# Patient Record
Sex: Female | Born: 1965 | Race: White | Hispanic: No | Smoking: Former smoker
Health system: Southern US, Community
[De-identification: ages and names within clinical notes are randomized; demographics above are authoritative.]

## PROBLEM LIST (undated history)

## (undated) DIAGNOSIS — N920 Excessive and frequent menstruation with regular cycle: Secondary | ICD-10-CM

## (undated) DIAGNOSIS — I480 Paroxysmal atrial fibrillation: Secondary | ICD-10-CM

## (undated) DIAGNOSIS — F419 Anxiety disorder, unspecified: Secondary | ICD-10-CM

## (undated) DIAGNOSIS — R0683 Snoring: Secondary | ICD-10-CM

## (undated) DIAGNOSIS — F329 Major depressive disorder, single episode, unspecified: Secondary | ICD-10-CM

## (undated) DIAGNOSIS — D509 Iron deficiency anemia, unspecified: Secondary | ICD-10-CM

## (undated) DIAGNOSIS — B019 Varicella without complication: Secondary | ICD-10-CM

## (undated) DIAGNOSIS — K219 Gastro-esophageal reflux disease without esophagitis: Secondary | ICD-10-CM

## (undated) DIAGNOSIS — Z8719 Personal history of other diseases of the digestive system: Secondary | ICD-10-CM

## (undated) DIAGNOSIS — M1990 Unspecified osteoarthritis, unspecified site: Secondary | ICD-10-CM

## (undated) DIAGNOSIS — G43909 Migraine, unspecified, not intractable, without status migrainosus: Secondary | ICD-10-CM

## (undated) DIAGNOSIS — I4891 Unspecified atrial fibrillation: Principal | ICD-10-CM

## (undated) DIAGNOSIS — D219 Benign neoplasm of connective and other soft tissue, unspecified: Secondary | ICD-10-CM

## (undated) DIAGNOSIS — D649 Anemia, unspecified: Secondary | ICD-10-CM

## (undated) DIAGNOSIS — F32A Depression, unspecified: Secondary | ICD-10-CM

## (undated) DIAGNOSIS — E538 Deficiency of other specified B group vitamins: Secondary | ICD-10-CM

## (undated) DIAGNOSIS — I10 Essential (primary) hypertension: Secondary | ICD-10-CM

## (undated) HISTORY — DX: Paroxysmal atrial fibrillation: I48.0

## (undated) HISTORY — DX: Snoring: R06.83

## (undated) HISTORY — DX: Unspecified osteoarthritis, unspecified site: M19.90

## (undated) HISTORY — DX: Excessive and frequent menstruation with regular cycle: N92.0

## (undated) HISTORY — DX: Migraine, unspecified, not intractable, without status migrainosus: G43.909

## (undated) HISTORY — PX: TUBAL LIGATION: SHX77

## (undated) HISTORY — DX: Benign neoplasm of connective and other soft tissue, unspecified: D21.9

## (undated) HISTORY — DX: Unspecified atrial fibrillation: I48.91

## (undated) HISTORY — DX: Anxiety disorder, unspecified: F41.9

## (undated) HISTORY — PX: OTHER SURGICAL HISTORY: SHX169

## (undated) HISTORY — DX: Varicella without complication: B01.9

## (undated) HISTORY — PX: ABDOMINAL HYSTERECTOMY: SHX81

## (undated) HISTORY — DX: Iron deficiency anemia, unspecified: D50.9

## (undated) HISTORY — DX: Depression, unspecified: F32.A

## (undated) HISTORY — DX: Deficiency of other specified B group vitamins: E53.8

## (undated) HISTORY — DX: Major depressive disorder, single episode, unspecified: F32.9

## (undated) HISTORY — DX: Gastro-esophageal reflux disease without esophagitis: K21.9

---

## 2004-12-13 ENCOUNTER — Ambulatory Visit: Payer: Self-pay | Admitting: Gastroenterology

## 2007-03-26 ENCOUNTER — Ambulatory Visit: Payer: Self-pay | Admitting: Unknown Physician Specialty

## 2007-04-01 ENCOUNTER — Ambulatory Visit: Payer: Self-pay | Admitting: Unknown Physician Specialty

## 2007-12-12 ENCOUNTER — Emergency Department: Payer: Self-pay | Admitting: Emergency Medicine

## 2008-01-21 ENCOUNTER — Inpatient Hospital Stay: Payer: Self-pay

## 2008-03-21 HISTORY — PX: TONSILLECTOMY: SUR1361

## 2008-10-06 ENCOUNTER — Encounter: Payer: Self-pay | Admitting: Orthopedic Surgery

## 2008-10-19 ENCOUNTER — Encounter: Payer: Self-pay | Admitting: Orthopedic Surgery

## 2008-12-10 ENCOUNTER — Encounter: Admission: RE | Admit: 2008-12-10 | Discharge: 2008-12-10 | Payer: Self-pay | Admitting: Sports Medicine

## 2009-09-12 ENCOUNTER — Emergency Department: Payer: Self-pay | Admitting: Internal Medicine

## 2009-12-19 HISTORY — PX: APPENDECTOMY: SHX54

## 2010-01-10 ENCOUNTER — Observation Stay: Payer: Self-pay | Admitting: Surgery

## 2010-02-26 ENCOUNTER — Inpatient Hospital Stay: Payer: Self-pay | Admitting: Psychiatry

## 2010-09-22 ENCOUNTER — Encounter: Payer: Self-pay | Admitting: Maternal & Fetal Medicine

## 2011-02-16 ENCOUNTER — Observation Stay: Payer: Self-pay | Admitting: Obstetrics and Gynecology

## 2011-02-27 ENCOUNTER — Observation Stay: Payer: Self-pay

## 2011-03-30 ENCOUNTER — Observation Stay: Payer: Self-pay

## 2011-04-21 ENCOUNTER — Ambulatory Visit: Payer: Self-pay

## 2011-04-24 ENCOUNTER — Inpatient Hospital Stay: Payer: Self-pay | Admitting: Obstetrics and Gynecology

## 2011-04-26 LAB — PATHOLOGY REPORT

## 2011-11-22 ENCOUNTER — Ambulatory Visit: Payer: Self-pay | Admitting: Family Medicine

## 2011-11-27 ENCOUNTER — Emergency Department: Payer: Self-pay | Admitting: Emergency Medicine

## 2011-11-27 LAB — BASIC METABOLIC PANEL
BUN: 12 mg/dL (ref 7–18)
Chloride: 105 mmol/L (ref 98–107)
Co2: 25 mmol/L (ref 21–32)
Creatinine: 0.77 mg/dL (ref 0.60–1.30)
EGFR (Non-African Amer.): >60
Glucose: 96 mg/dL (ref 65–99)
Osmolality: 277 (ref 275–301)
Sodium: 139 mmol/L (ref 136–145)

## 2011-11-27 LAB — CBC
HCT: 39.5 % (ref 35.0–47.0)
MCH: 27.1 pg (ref 26.0–34.0)
MCHC: 32.2 g/dL (ref 32.0–36.0)
Platelet: 376 10*3/uL (ref 150–440)
RBC: 4.7 10*6/uL (ref 3.80–5.20)
RDW: 17.3 % — ABNORMAL HIGH (ref 11.5–14.5)

## 2011-11-27 LAB — URINALYSIS, COMPLETE
Bilirubin,UR: NEGATIVE
Nitrite: NEGATIVE
Protein: NEGATIVE
Specific Gravity: 1.028 (ref 1.003–1.030)

## 2011-11-28 LAB — URINE CULTURE

## 2012-03-21 ENCOUNTER — Other Ambulatory Visit: Payer: Self-pay | Admitting: Gastroenterology

## 2012-03-29 ENCOUNTER — Ambulatory Visit: Payer: Self-pay | Admitting: Gastroenterology

## 2013-01-29 ENCOUNTER — Ambulatory Visit: Payer: Self-pay | Admitting: General Surgery

## 2013-02-03 ENCOUNTER — Ambulatory Visit: Payer: Self-pay

## 2013-02-10 ENCOUNTER — Encounter: Payer: Self-pay | Admitting: General Surgery

## 2013-02-10 ENCOUNTER — Ambulatory Visit (INDEPENDENT_AMBULATORY_CARE_PROVIDER_SITE_OTHER): Payer: BC Managed Care – PPO | Admitting: General Surgery

## 2013-02-10 VITALS — BP 138/80 | HR 76 | Resp 14 | Ht 65.0 in | Wt 193.0 lb

## 2013-02-10 DIAGNOSIS — L723 Sebaceous cyst: Secondary | ICD-10-CM

## 2013-02-10 DIAGNOSIS — IMO0002 Reserved for concepts with insufficient information to code with codable children: Secondary | ICD-10-CM | POA: Insufficient documentation

## 2013-02-10 NOTE — Progress Notes (Signed)
[name], female   DOB: September 27, 1965, 47 y.o.   MRN: [id number]  Chief Complaint  Patient presents with  . Other    cyst    HPI [name] [name] is a 47 y.o. female here today for an recurrenting   right groin cyst. Patient states this is the 4th time she has had one since her daughter was born 66 months ago. She is using warm compresses and applying topical antibiotics to this area. She states it is painful at here underwear line.Patient has had it lanced twice before.  HPI  Past Medical History  Diagnosis Date  . GERD (gastroesophageal reflux disease)     Past Surgical History  Procedure Laterality Date  . Appendectomy  5/11  . Cesarean section  10/98,9/12  . Tonsillectomy  03/2008    History reviewed. No pertinent family history.  Social History History  Substance Use Topics  . Smoking status: Former Smoker -- 1.00 packs/day for 10 years  . Smokeless tobacco: Never Used  . Alcohol Use: Yes    Allergies  Allergen Reactions  . Augmentin (Amoxicillin-Pot Clavulanate) Nausea Only  . Codeine Nausea Only  . Latex Rash  . Percocet (Oxycodone-Acetaminophen) Rash    Current Outpatient Prescriptions  Medication Sig Dispense Refill  . clonazePAM (KLONOPIN) 1 MG tablet Take 1 mg by mouth 2 (two) times daily as needed for anxiety.      Marland Kitchen omeprazole (PRILOSEC) 20 MG capsule Take 20 mg by mouth daily.      . sertraline (ZOLOFT) 100 MG tablet Take 100 mg by mouth daily.      . traMADol (ULTRAM) 50 MG tablet Take 50 mg by mouth every 6 (six) hours as needed for pain.       No current facility-administered medications for this visit.    Review of Systems Review of Systems  Constitutional: Negative.   Respiratory: Negative.   Cardiovascular: Negative.     Blood pressure 138/80, pulse 76, resp. rate 14, height 5\' 5"  (1.651 m), weight 193 lb (87.544 kg), last menstrual period 01/27/2013.  Physical Exam Physical Exam  Abdominal: Soft. There is no hepatomegaly.  There is no tenderness. No hernia.    Lymphadenopathy:       Right: No inguinal adenopathy present.       Left: No inguinal adenopathy present.    Data Reviewed Office notes Dr. 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     Plan    Patient to have an excision right inguinal skin cyst          SANKAR,SEEPLAPUTHUR G 02/10/2013, 1:04 PM

## 2013-02-10 NOTE — Patient Instructions (Addendum)
Patient to return for an excision

## 2013-02-20 ENCOUNTER — Ambulatory Visit (INDEPENDENT_AMBULATORY_CARE_PROVIDER_SITE_OTHER): Payer: BC Managed Care – PPO | Admitting: General Surgery

## 2013-02-20 ENCOUNTER — Encounter: Payer: Self-pay | Admitting: General Surgery

## 2013-02-20 VITALS — BP 110/86 | HR 70 | Resp 12 | Ht 65.0 in | Wt 190.0 lb

## 2013-02-20 DIAGNOSIS — IMO0002 Reserved for concepts with insufficient information to code with codable children: Secondary | ICD-10-CM

## 2013-02-20 DIAGNOSIS — L723 Sebaceous cyst: Secondary | ICD-10-CM

## 2013-02-20 NOTE — Patient Instructions (Addendum)
The patient is aware to call back for any questions or concerns. Keep dressing and area dry for a day.  Monitor for skin irriation from dressing.

## 2013-02-20 NOTE — Progress Notes (Signed)
[name], female   DOB: [birth year], 47 y.o.   MRN: [id number]  Chief Complaint  Patient presents with  . Procedure    excision skin cyst right groin    HPI [name] [name] is a 47 y.o. female here today for a right groin excision skin cyst. HPI  Past Medical History  Diagnosis Date  . GERD (gastroesophageal reflux disease)     Past Surgical History  Procedure Laterality Date  . Appendectomy  5/11  . Cesarean section  10/98,9/12  . Tonsillectomy  03/2008    History reviewed. No pertinent family history.  Social History History  Substance Use Topics  . Smoking status: Former Smoker -- 1.00 packs/day for 10 years  . Smokeless tobacco: Never Used  . Alcohol Use: Yes    Allergies  Allergen Reactions  . Augmentin (Amoxicillin-Pot Clavulanate) Nausea Only  . Codeine Nausea Only  . Latex Rash  . Percocet (Oxycodone-Acetaminophen) Rash    Current Outpatient Prescriptions  Medication Sig Dispense Refill  . clonazePAM (KLONOPIN) 1 MG tablet Take 1 mg by mouth 2 (two) times daily as needed for anxiety.      Marland Kitchen omeprazole (PRILOSEC) 20 MG capsule Take 20 mg by mouth daily.      . sertraline (ZOLOFT) 100 MG tablet Take 100 mg by mouth daily.      . traMADol (ULTRAM) 50 MG tablet Take 50 mg by mouth every 6 (six) hours as needed for pain.       No current facility-administered medications for this visit.    Review of Systems Review of Systems  Constitutional: Negative.   Respiratory: Negative.   Cardiovascular: Negative.     Blood pressure 110/86, pulse 70, resp. rate 12, height 5\' 5"  (1.651 m), weight 190 lb (86.183 kg), last menstrual period 01/27/2013.  Physical Exam Physical Exam  Data Reviewed none  Assessment    Snkin cyst right groin     Plan    Excision completed today.      Procedure: excision skin cyst right groin. Prep : betadine Anesthetic: 1% xylocaine mixed with 0.5% marcaine, 5 ml. Elliptical incision made around the cyst and  excised from subcutaneous tissue. Disposable cautery used. Skin closed with subcuticular 4-0 vicryl.  Dressed with steristrips, gauze and tegaderm. No immediate problems from procedure.  SANKAR,SEEPLAPUTHUR G 02/21/2013, 7:41 AM

## 2013-02-21 ENCOUNTER — Encounter: Payer: Self-pay | Admitting: General Surgery

## 2013-02-26 ENCOUNTER — Telehealth: Payer: Self-pay | Admitting: *Deleted

## 2013-02-26 LAB — PATHOLOGY

## 2013-02-26 NOTE — Telephone Encounter (Signed)
Message copied by Currie Paris on Wed Feb 26, 2013  8:29 AM ------      Message from: Kieth Brightly      Created: Tue Feb 25, 2013  5:11 PM       Please let pt pt know the pathology was normal. ------

## 2013-02-26 NOTE — Telephone Encounter (Signed)
Notified patient as instructed, patient pleased. Discussed follow-up appointments on an as needed basis, patient agrees.  States area is healing well, just a little sore.

## 2013-03-26 ENCOUNTER — Observation Stay: Payer: Self-pay | Admitting: Internal Medicine

## 2013-03-26 LAB — CBC WITH DIFFERENTIAL/PLATELET
Basophil #: 0.1 10*3/uL (ref 0.0–0.1)
Eosinophil %: 1.9 %
HGB: 12.7 g/dL (ref 12.0–16.0)
Lymphocyte #: 2.6 10*3/uL (ref 1.0–3.6)
MCH: 26.4 pg (ref 26.0–34.0)
Monocyte #: 0.7 x10 3/mm (ref 0.2–0.9)
Neutrophil %: 61.3 %
Platelet: 349 10*3/uL (ref 150–440)
WBC: 9.1 10*3/uL (ref 3.6–11.0)

## 2013-03-26 LAB — CK TOTAL AND CKMB (NOT AT ARMC)
CK, Total: 74 U/L (ref 21–215)
CK-MB: <0.5 ng/mL — ABNORMAL LOW (ref 0.5–3.6)

## 2013-03-26 LAB — BASIC METABOLIC PANEL
Calcium, Total: 9 mg/dL (ref 8.5–10.1)
Creatinine: 0.89 mg/dL (ref 0.60–1.30)
Sodium: 139 mmol/L (ref 136–145)

## 2013-03-27 DIAGNOSIS — I059 Rheumatic mitral valve disease, unspecified: Secondary | ICD-10-CM

## 2013-03-27 DIAGNOSIS — I4891 Unspecified atrial fibrillation: Secondary | ICD-10-CM

## 2013-03-27 LAB — CBC WITH DIFFERENTIAL/PLATELET
Basophil #: 0 10*3/uL (ref 0.0–0.1)
Basophil %: 0.5 %
Eosinophil %: 3.6 %
Lymphocyte #: 2.4 10*3/uL (ref 1.0–3.6)
MCH: 26.3 pg (ref 26.0–34.0)
Neutrophil #: 3 10*3/uL (ref 1.4–6.5)
Platelet: 273 10*3/uL (ref 150–440)
WBC: 6.2 10*3/uL (ref 3.6–11.0)

## 2013-03-27 LAB — LIPID PANEL
HDL Cholesterol: 45 mg/dL (ref 40–60)
Ldl Cholesterol, Calc: 111 mg/dL — ABNORMAL HIGH (ref 0–100)
Triglycerides: 84 mg/dL (ref 0–200)

## 2013-03-27 LAB — BASIC METABOLIC PANEL
BUN: 8 mg/dL (ref 7–18)
Calcium, Total: 8.5 mg/dL (ref 8.5–10.1)
Creatinine: 0.76 mg/dL (ref 0.60–1.30)
EGFR (Non-African Amer.): >60
Glucose: 98 mg/dL (ref 65–99)
Sodium: 140 mmol/L (ref 136–145)

## 2013-03-27 LAB — CK TOTAL AND CKMB (NOT AT ARMC)
CK, Total: 56 U/L (ref 21–215)
CK-MB: <0.5 ng/mL — ABNORMAL LOW (ref 0.5–3.6)

## 2013-03-31 ENCOUNTER — Telehealth: Payer: Self-pay

## 2013-03-31 NOTE — Telephone Encounter (Signed)
Called spoke with pt, pt reports HR in 40's yesterday 03/30/13.  Pt called spoke with Dr Ladona Ridgel on call he advised pt to hold Cardizem.  Pt was in [facility] with rapid afib, discharged on Cardizem 30mg  QID and Metoprolol 25mg  BID and prn.  Pt is still taking Metoprolol BID and HR seems to have returned to normal rate 60's and pt reports feeling better.  Advised pt to continue on Metoprolol 25mg  BID and continue to monitor HR.  Call back with problems.  Will make Dr Mariah Milling aware of medication changes and call pt back if Dr Ethelene Hal recommends any additional changes be made.  Pt verbalizes understanding.

## 2013-03-31 NOTE — Telephone Encounter (Signed)
Pt states she talked with Dr. Ladona Ridgel yesterday, and he told her to stop the medication the hospital gave her for irregular HR, she is not sure of the name. States Dr. Mariah Milling prescribed her Metoprolol, which she is still taking. Wants to make sure it is ok with dr Mariah Milling for her to stop this medication. States she does feel better since she stopped taking this Pt is at [employer],  states when we return her call ,press 0 and have her paged.

## 2013-04-10 ENCOUNTER — Encounter: Payer: Self-pay | Admitting: Cardiovascular Disease

## 2013-04-10 ENCOUNTER — Ambulatory Visit (INDEPENDENT_AMBULATORY_CARE_PROVIDER_SITE_OTHER): Payer: BC Managed Care – PPO | Admitting: Cardiovascular Disease

## 2013-04-10 ENCOUNTER — Encounter: Payer: Self-pay | Admitting: Physician Assistant

## 2013-04-10 VITALS — BP 110/70 | HR 109 | Ht 65.0 in | Wt 188.2 lb

## 2013-04-10 DIAGNOSIS — I498 Other specified cardiac arrhythmias: Secondary | ICD-10-CM

## 2013-04-10 DIAGNOSIS — I4902 Ventricular flutter: Secondary | ICD-10-CM

## 2013-04-10 DIAGNOSIS — I480 Paroxysmal atrial fibrillation: Secondary | ICD-10-CM | POA: Insufficient documentation

## 2013-04-10 DIAGNOSIS — I4891 Unspecified atrial fibrillation: Secondary | ICD-10-CM

## 2013-04-10 MED ORDER — FLECAINIDE ACETATE 50 MG PO TABS: 50.0000 mg | ORAL_TABLET | Freq: Two times a day (BID) | ORAL | Status: DC

## 2013-04-10 MED ORDER — METOPROLOL TARTRATE 25 MG PO TABS: 25.0000 mg | ORAL_TABLET | Freq: Two times a day (BID) | ORAL | Status: DC

## 2013-04-10 NOTE — Assessment & Plan Note (Signed)
Continued paroxysmal atrial fibrillation. She is symptomatic. For now we will start her on gentle anticoagulation after a long discussion. She will start xarelto 15 mg daily on the hope of stopping this in several weeks' time. We have discussed this with her that it is the low-dose only.  In an effort to control her rhythm, we'll start flecainide 50 mg twice a day with metoprolol 25 mg twice a day. If she continues to have palpitations, EKG showing atrial fibrillation, we would increase the flecainide to 100 mg twice a day. She was unable to tolerate calcium channel blockers secondary to hypotension.

## 2013-04-10 NOTE — Patient Instructions (Addendum)
You are still having atrial fibrillation We will start flecainide 50 mg twice a day Continue on metoprolol 25 mg twice a day  Start xarelto 15 mg once a day (blood thinner) until rhythm is regulated  Please call us if you have new issues that need to be addressed before your next appt.  Your physician wants you to follow-up in: 1 month.

## 2013-04-10 NOTE — Progress Notes (Signed)
[name], female    DOB: June 05, 1966, 47 y.o.   MRN: [id number]  HPI Comments: [name] [name] is a 47 year old woman with history of GERD, presenting to the hospital 03/27/2013 with tachycardia, shortness of breath. Diagnosed with atrial fibrillation with RVR, heart rate 140s. She was started on Cardizem, beta blocker with improved heart rate control, converting to normal sinus rhythm with short runs of tachycardia. She was discharged on Cardizem 4 times a day, metoprolol 25 mg twice a day.  Outpatient, she reports heart rates in the 40s. She call the office and was told to hold the Cardizem. Now she has tachycardia, EKG showing heart rates greater than 100. Otherwise she feels well with no complaints. She does not want anticoagulation if she can help it. She reports having significant stressors, otherwise no other active medical issues.  Echocardiogram August 2014 showing normal LV systolic function, LV diastolic relaxation abnormality, normal right ventricular systolic pressure  EKG shows normal sinus rhythm with frequent short runs of atrial fibrillation/   Outpatient Encounter Prescriptions as of 04/10/2013  Medication Sig Dispense Refill  . clonazePAM (KLONOPIN) 1 MG tablet Take 1 mg by mouth 2 (two) times daily as needed for anxiety.      . metoprolol tartrate (LOPRESSOR) 25 MG tablet Take 1 tablet (25 mg total) by mouth 2 (two) times daily. May take an additional tablet for palpitations or tachycardia  180 tablet  3  . omeprazole (PRILOSEC) 20 MG capsule Take 20 mg by mouth daily.      . sertraline (ZOLOFT) 100 MG tablet Take 100 mg by mouth daily.      . traMADol (ULTRAM) 50 MG tablet Take 50 mg by mouth every 6 (six) hours as needed for pain.       Review of Systems  Constitutional: Negative.   HENT: Negative.   Eyes: Negative.   Respiratory: Positive for shortness of breath.   Cardiovascular: Positive for palpitations.  Gastrointestinal: Negative.   Musculoskeletal:  Negative.   Skin: Negative.   Neurological: Negative.   Psychiatric/Behavioral: Negative.   All other systems reviewed and are negative.    BP 110/70  Pulse 109  Ht 5\' 5"  (1.651 m)  Wt 188 lb 4 oz (85.39 kg)  BMI 31.33 kg/m2  Physical Exam  Nursing note and vitals reviewed. Constitutional: She is oriented to person, place, and time. She appears well-developed and well-nourished.  HENT:  Head: Normocephalic.  Nose: Nose normal.  Mouth/Throat: Oropharynx is clear and moist.  Eyes: Conjunctivae are normal. Pupils are equal, round, and reactive to light.  Neck: Normal range of motion. Neck supple. No JVD present.  Cardiovascular: Normal rate, regular rhythm, S1 normal, S2 normal, normal heart sounds and intact distal pulses.  Exam reveals no gallop and no friction rub.   No murmur heard. Pulmonary/Chest: Effort normal and breath sounds normal. No respiratory distress. She has no wheezes. She has no rales. She exhibits no tenderness.  Abdominal: Soft. Bowel sounds are normal. She exhibits no distension. There is no tenderness.  Musculoskeletal: Normal range of motion. She exhibits no edema and no tenderness.  Lymphadenopathy:    She has no cervical adenopathy.  Neurological: She is alert and oriented to person, place, and time. Coordination normal.  Skin: Skin is warm and dry. No rash noted. No erythema.  Psychiatric: She has a normal mood and affect. Her behavior is normal. Judgment and thought content normal.    Assessment and Plan

## 2013-04-14 ENCOUNTER — Telehealth: Payer: Self-pay

## 2013-04-14 ENCOUNTER — Ambulatory Visit (INDEPENDENT_AMBULATORY_CARE_PROVIDER_SITE_OTHER): Payer: BC Managed Care – PPO

## 2013-04-14 VITALS — BP 114/79 | HR 108 | Ht 65.0 in | Wt 188.4 lb

## 2013-04-14 DIAGNOSIS — R079 Chest pain, unspecified: Secondary | ICD-10-CM

## 2013-04-14 NOTE — Telephone Encounter (Signed)
pt called in today wanting to s/w a nurse states she is not feeling since Dr. Mariah Milling started her on flecaininde 50 bid, metoprolol 25 bid. states a little lightheaded, chest pressure but felt better once she sat down, also night sweats since starting med. Advised I will d/w Dr. Mariah Milling about her sxms and cb at [phone] her work number per pt request. Pt verbalized understanding to Plan of Care.

## 2013-04-14 NOTE — Patient Instructions (Addendum)
STOP FLECAINIDE PER DR. Mariah Milling AS OF TODAY;  OK TO TAKE EXTRA METOPROLOL FOR BREAK THROUGH PALPITATIONS ;  TAKE 50 MG OF METOPROLOL TONIGHT 04/14/13  YOU HAVE A FOLLOW UP WITH DR. Mariah Milling 04/28/13 @ 8 AM

## 2013-04-14 NOTE — Progress Notes (Signed)
[name], female   DOB: March 11, 1966, 47 y.o.   MRN: [id number] Pt called earlier today stating chest pressure, lightheaded feeling and night sweats and just not feeling well today. Pt was started on flecainide 50 mg bid last week when she saw Dr. Mariah Milling. I d/w Dr. Mariah Milling about pt's sxms and he states lets have pt try decreasing flecainide in 1/2 to 25 mg bid. Pt then states to me she was still having the chest pressure with exertion. I sated she may need to go to the ED but let me d/w Dr. Mariah Milling.   Pt said she may not go to the ED but will wait for my cb. I d/w Dr. Mariah Milling in further detail about the chest pressure with exertion and he said pt probably does not need to go to the ED but let have her come in for an EKG and BP check. When I was trying tcb pt one of the schedulers came to me and said that pt had cb and said she wanted to come in for BP check. I said that was fine. I then cb pt on her cell and explained great that she was coming in so we can do an EKG and check her BP and see what Dr. Mariah Milling says..  Vitals today BP: 114/79 HR: 108 WT: 188.4 lb HT: 5\' 5" 

## 2013-04-14 NOTE — Telephone Encounter (Signed)
ekg in the office appears to be NSR with short runs of atrial tachycardia, rate was elevated Could try metoprolol 1 1/2 tabs BID if blood pressure tolerates. Flecainide was for atrial fib Suspect she may be having two types of rhythm, atrial fib and atrial tach. Meds should work for both if tolerated

## 2013-04-14 NOTE — Telephone Encounter (Signed)
s/w Dr. Mariah Milling he recommends to decrease fliecainide to 25 mg bid , pt aware. Pt states still having chest pressure and is with exertion. I advised that maybe should she should go to ED to be evaluated. Pt states she may not go. I said let me d/w Dr. Mariah Milling and cb. Dr. Mariah Milling states just d/c flecainide and when she feels better to go back on flecainide 25 mg bid and to keep a record of her BP and HR while off the flecainide.

## 2013-04-16 NOTE — Telephone Encounter (Signed)
Spoke w/ pt.  States that her HR has been up to 117 and down to 49 within a matter of hours.  She c/o fatigue on exertion.   BP has been in normal range, but pt feels exhausted.  Instructed her to try metoprolol 1 1/2 tabs BID and keep an eye on her BP.  Educated pt on normal values for BP & HR and when to seek medical attention.  Pt verbalizes understanding & states she will go to ED for evaluation if needed.

## 2013-04-17 ENCOUNTER — Telehealth: Payer: Self-pay

## 2013-04-17 NOTE — Telephone Encounter (Signed)
Pt would like a call regarding meds. BP is 102/68. HR is 53. Please call

## 2013-04-17 NOTE — Telephone Encounter (Signed)
Spoke w/ pt.  She states that her vitals this afternoon were BP 102/68, P 53, after a nap,105/72, 105 and 3 minutes later 92/62, 85.  Consulted with Alinda Money, [place] who suggested increasing pt's flecainide, but according to Dr. Windell Hummingbird note on 8/25, pt was to stop flecainide and resume 25 mg bid when she felt better.  Instructed pt to resume flecainide and not to take metoprolol 1 1/2 tabs if HR is in the 50 range.  Pt verbalizes understanding and will keep her appt with Dr. Mariah Milling on 9/8.  She would appreciate it if Dr. Mariah Milling would review her "situation" and make any suggestions when he gets back in the office.

## 2013-04-18 NOTE — Telephone Encounter (Signed)
If BP drops on metoprolol 37.5 mg BID, Would go back to 25 BID  Flecainide was started because of low BPs And need for rhythm control Would restart flecainide 25 bid if chest pain resolved, Only titrate upwards for heart rate >100

## 2013-04-18 NOTE — Telephone Encounter (Signed)
Spoke w/ pt.  She states that she felt much better today and verbalizes understanding.

## 2013-04-24 ENCOUNTER — Encounter: Payer: Self-pay | Admitting: Cardiovascular Disease

## 2013-04-28 ENCOUNTER — Ambulatory Visit: Payer: BC Managed Care – PPO | Admitting: Cardiovascular Disease

## 2013-05-07 ENCOUNTER — Ambulatory Visit: Payer: BC Managed Care – PPO | Admitting: Cardiovascular Disease

## 2013-05-07 ENCOUNTER — Encounter: Payer: Self-pay | Admitting: Cardiovascular Disease

## 2013-05-07 ENCOUNTER — Ambulatory Visit (INDEPENDENT_AMBULATORY_CARE_PROVIDER_SITE_OTHER): Payer: BC Managed Care – PPO | Admitting: Cardiovascular Disease

## 2013-05-07 VITALS — BP 100/64 | HR 70 | Ht 65.0 in | Wt 187.0 lb

## 2013-05-07 DIAGNOSIS — I4891 Unspecified atrial fibrillation: Secondary | ICD-10-CM

## 2013-05-07 NOTE — Progress Notes (Signed)
[name], female    DOB: 1965/12/10, 47 y.o.   MRN: [id number]  HPI Comments: [name] [name] is a 47 year old woman with history of GERD, presenting to the hospital 03/27/2013 with tachycardia, shortness of breath. Diagnosed with atrial fibrillation with RVR, heart rate 140s. She was started on Cardizem, beta blocker with improved heart rate control, converting to normal sinus rhythm with short runs of tachycardia. She was discharged on Cardizem 4 times a day, metoprolol 25 mg twice a day.  On her last clinic visit, she was started on flecainide 50 mg twice a day with low-dose metoprolol 25 mg twice a day. On this regimen, she reports that she has been doing well. She has rare episodes of palpitations 2 or 3 times per week lasting for several minutes at a time. Otherwise she feels well. She's interested in stopping the anticoagulation  Echocardiogram August 2014 showing normal LV systolic function, LV diastolic relaxation abnormality, normal right ventricular systolic pressure TSH in the hospital was normal, 1.9  EKG shows normal sinus rhythm with  APCs   Outpatient Encounter Prescriptions as of 05/07/2013  Medication Sig Dispense Refill  . clonazePAM (KLONOPIN) 1 MG tablet Take 1 mg by mouth 2 (two) times daily as needed for anxiety.      . flecainide (TAMBOCOR) 50 MG tablet Take 50 mg by mouth 2 (two) times daily.      . metoprolol tartrate (LOPRESSOR) 25 MG tablet Take 1 tablet (25 mg total) by mouth 2 (two) times daily. May take an additional tablet for palpitations or tachycardia  180 tablet  3  . omeprazole (PRILOSEC) 20 MG capsule Take 20 mg by mouth daily.      . Rivaroxaban (XARELTO) 20 MG TABS tablet Take 20 mg by mouth daily.      . sertraline (ZOLOFT) 100 MG tablet Take 100 mg by mouth daily.      . traMADol (ULTRAM) 50 MG tablet Take 50 mg by mouth every 6 (six) hours as needed for pain.        Review of Systems  Constitutional: Negative.   HENT: Negative.   Eyes:  Negative.   Respiratory: Negative.   Cardiovascular: Negative.   Gastrointestinal: Negative.   Endocrine: Negative.   Musculoskeletal: Negative.   Skin: Negative.   Allergic/Immunologic: Negative.   Neurological: Negative.   Hematological: Negative.   Psychiatric/Behavioral: Negative.   All other systems reviewed and are negative.    BP 100/64  Pulse 70  Ht 5\' 5"  (1.651 m)  Wt 187 lb (84.823 kg)  BMI 31.12 kg/m2  Physical Exam  Nursing note and vitals reviewed. Constitutional: She is oriented to person, place, and time. She appears well-developed and well-nourished.  HENT:  Head: Normocephalic.  Nose: Nose normal.  Mouth/Throat: Oropharynx is clear and moist.  Eyes: Conjunctivae are normal. Pupils are equal, round, and reactive to light.  Neck: Normal range of motion. Neck supple. No JVD present.  Cardiovascular: Normal rate, regular rhythm, S1 normal, S2 normal, normal heart sounds and intact distal pulses.  Exam reveals no gallop and no friction rub.   No murmur heard. Pulmonary/Chest: Effort normal and breath sounds normal. No respiratory distress. She has no wheezes. She has no rales. She exhibits no tenderness.  Abdominal: Soft. Bowel sounds are normal. She exhibits no distension. There is no tenderness.  Musculoskeletal: Normal range of motion. She exhibits no edema and no tenderness.  Lymphadenopathy:    She has no cervical adenopathy.  Neurological: She is  alert and oriented to person, place, and time. Coordination normal.  Skin: Skin is warm and dry. No rash noted. No erythema.  Psychiatric: She has a normal mood and affect. Her behavior is normal. Judgment and thought content normal.    Assessment and Plan

## 2013-05-07 NOTE — Patient Instructions (Addendum)
You are doing well. Please hold the xarelto Start aspirin daily  Please call us if you have new issues that need to be addressed before your next appt.  Your physician wants you to follow-up in: 6 months.  You will receive a reminder letter in the mail two months in advance. If you don't receive a letter, please call our office to schedule the follow-up appointment.

## 2013-05-07 NOTE — Assessment & Plan Note (Signed)
Rare episodes of palpitations concerning for short runs of atrial fibrillation lasting for several minutes at a time, rarely during the week. We have suggested that she stay on her current medications. She's interested in holding the xarelto. We have suggested she change to aspirin but if she starts having more frequent episodes, she should take xarelto and call our office

## 2013-05-13 ENCOUNTER — Ambulatory Visit: Payer: BC Managed Care – PPO | Admitting: Cardiovascular Disease

## 2013-05-30 ENCOUNTER — Ambulatory Visit (INDEPENDENT_AMBULATORY_CARE_PROVIDER_SITE_OTHER): Payer: BC Managed Care – PPO | Admitting: Cardiovascular Disease

## 2013-05-30 ENCOUNTER — Telehealth: Payer: Self-pay

## 2013-05-30 ENCOUNTER — Encounter: Payer: Self-pay | Admitting: Cardiovascular Disease

## 2013-05-30 VITALS — BP 98/68 | HR 64 | Ht 65.0 in | Wt 187.2 lb

## 2013-05-30 DIAGNOSIS — I4891 Unspecified atrial fibrillation: Secondary | ICD-10-CM

## 2013-05-30 DIAGNOSIS — R5381 Other malaise: Secondary | ICD-10-CM

## 2013-05-30 DIAGNOSIS — R5383 Other fatigue: Secondary | ICD-10-CM

## 2013-05-30 DIAGNOSIS — R0789 Other chest pain: Secondary | ICD-10-CM

## 2013-05-30 NOTE — Telephone Encounter (Signed)
Pt states she has been having palpitation, dizzy, tired, for the past 3 days, hasnt felt good.Pt states she "just doesn't feel right". Please call.

## 2013-05-30 NOTE — Telephone Encounter (Signed)
Spoke w/ pt.  She states that she has had a lot of palpitations over the past few days.  BP 116/70, hr 101, a few mins later 53. Pain in shoulder, tired, dizzy. Pt to come in at 1:45 today to see Dr. Mariah Milling.

## 2013-05-30 NOTE — Assessment & Plan Note (Signed)
No recent episodes of atrial fibrillation. She does have fatigue and low blood pressure. Blood pressures from home sometimes in the 90s, rare 80 systolic. We have suggested she cut her metoprolol tartrate in half and take 12.5 mg twice a day and continue on flecainide 50 mg twice a day.

## 2013-05-30 NOTE — Progress Notes (Signed)
[name], female    DOB: 1966-02-29, 47 y.o.   MRN: [id number]  HPI Comments: [name] [name] is a 47 year old woman with history of GERD, presenting to the hospital 03/27/2013 with tachycardia, shortness of breath. Diagnosed with atrial fibrillation with RVR, heart rate 140s. She was started on Cardizem, beta blocker with improved heart rate control, converting to normal sinus rhythm with short runs of tachycardia. She was discharged on Cardizem 4 times a day, metoprolol 25 mg twice a day.  On a prior clinic visit she was started on flecainide 50 mg twice a day with low-dose metoprolol 25 mg twice a day. On this regimen, she reports that she has been doing well. She has rare episodes of palpitations 2 or 3 times per week. We encouraged her to stay on her anticoagulation .  In followup today, she reports that she has been tired over the past 2 or 3 days. She has some tightness in her posterior shoulder area. She has a 27-year-old that is not sleeping well at night and still taking a bottle 3 times per night . Sleep has been poor . She wonders if the medications could be contributing to her fatigue. She continues to have occasional palpitations that are short-lived. Nothing like prior atrial fibrillation episodes.  Echocardiogram August 2014 showing normal LV systolic function, LV diastolic relaxation abnormality, normal right ventricular systolic pressure TSH in the hospital was normal, 1.9  EKG shows normal sinus rhythm with  APCs. Rate 63 beats per minute   Outpatient Encounter Prescriptions as of 05/30/2013  Medication Sig Dispense Refill  . aspirin EC 325 MG tablet Take 325 mg by mouth daily.      . clonazePAM (KLONOPIN) 1 MG tablet Take 1 mg by mouth 2 (two) times daily as needed for anxiety.      . flecainide (TAMBOCOR) 50 MG tablet Take 50 mg by mouth 2 (two) times daily.      . metoprolol tartrate (LOPRESSOR) 25 MG tablet Take 1 tablet (25 mg total) by mouth 2 (two) times  daily. May take an additional tablet for palpitations or tachycardia  180 tablet  3  . omeprazole (PRILOSEC) 20 MG capsule Take 20 mg by mouth daily.      . sertraline (ZOLOFT) 100 MG tablet Take 100 mg by mouth daily.      . traMADol (ULTRAM) 50 MG tablet Take 50 mg by mouth every 6 (six) hours as needed for pain.      . [DISCONTINUED] Rivaroxaban (XARELTO) 20 MG TABS tablet Take 20 mg by mouth daily.       No facility-administered encounter medications on file as of 05/30/2013.    Review of Systems  Constitutional: Positive for fatigue.  HENT: Negative.   Eyes: Negative.   Respiratory: Negative.   Cardiovascular: Negative.   Gastrointestinal: Negative.   Endocrine: Negative.   Musculoskeletal: Negative.   Skin: Negative.   Allergic/Immunologic: Negative.   Neurological: Negative.   Hematological: Negative.   Psychiatric/Behavioral: Negative.   All other systems reviewed and are negative.    BP 98/68  Pulse 64  Ht 5\' 5"  (1.651 m)  Wt 187 lb 4 oz (84.936 kg)  BMI 31.16 kg/m2  Physical Exam  Nursing note and vitals reviewed. Constitutional: She is oriented to person, place, and time. She appears well-developed and well-nourished.  HENT:  Head: Normocephalic.  Nose: Nose normal.  Mouth/Throat: Oropharynx is clear and moist.  Eyes: Conjunctivae are normal. Pupils are equal, round, and reactive  to light.  Neck: Normal range of motion. Neck supple. No JVD present.  Cardiovascular: Normal rate, regular rhythm, S1 normal, S2 normal, normal heart sounds and intact distal pulses.  Exam reveals no gallop and no friction rub.   No murmur heard. Pulmonary/Chest: Effort normal and breath sounds normal. No respiratory distress. She has no wheezes. She has no rales. She exhibits no tenderness.  Abdominal: Soft. Bowel sounds are normal. She exhibits no distension. There is no tenderness.  Musculoskeletal: Normal range of motion. She exhibits no edema and no tenderness.   Lymphadenopathy:    She has no cervical adenopathy.  Neurological: She is alert and oriented to person, place, and time. Coordination normal.  Skin: Skin is warm and dry. No rash noted. No erythema.  Psychiatric: She has a normal mood and affect. Her behavior is normal. Judgment and thought content normal.    Assessment and Plan

## 2013-05-30 NOTE — Assessment & Plan Note (Signed)
Some of her fatigue likely from taking care of HER-45-year-old and sleep deprivation. We'll decrease her metoprolol dose given hypotension

## 2013-05-30 NOTE — Patient Instructions (Signed)
You are doing well.  Please try metoprolol 1/2 pill twice a day with flecainide twice a day Take extra flecainide for palpitations or tachycardia  Please call us if you have new issues that need to be addressed before your next appt.  Your physician wants you to follow-up in: 6 months.  You will receive a reminder letter in the mail two months in advance. If you don't receive a letter, please call our office to schedule the follow-up appointment.

## 2013-06-26 ENCOUNTER — Other Ambulatory Visit: Payer: Self-pay

## 2013-10-15 ENCOUNTER — Telehealth: Payer: Self-pay | Admitting: *Deleted

## 2013-10-15 NOTE — Telephone Encounter (Signed)
Spoke w/ pt.  She reports that her BP was previously 103/55 HR 66, earlier today it was 159/131 HR 99. On further questioning, pt reports that her 2-y/o was "fiddling w/ the machine" and she was upset while the second reading was being taken. Reports that she is not taking her metoprolol at all, as it made her BP drop too low. Pt inquired if there was any med that we could call in for her to help her deal w/ her anxiety, as she has been "locked up" w/ her 46 y/o b/c of the weather and knows she will not able to leave the house for several more days. Spoke w/ pt about ways to deal w/ her anxiety and she reports that she relies heavily on her mother. Advised pt to contact her PCP, but she states that Dr. Dear will not write for this type of med, as she previously had a psychiatrist write this for her. Advised pt to contact PCP's office and inquire about a possible referral to speak w/ someone.  Pt is agreeable to this and will call w/ further questions or concerns.

## 2013-10-15 NOTE — Telephone Encounter (Signed)
Please call patient. Her bp is up and her heart is racing.

## 2013-12-29 ENCOUNTER — Ambulatory Visit: Payer: Self-pay

## 2014-01-30 ENCOUNTER — Telehealth: Payer: Self-pay | Admitting: *Deleted

## 2014-01-30 NOTE — Telephone Encounter (Signed)
Patient wants to be taken off the recall list.

## 2014-06-15 ENCOUNTER — Telehealth: Payer: Self-pay

## 2014-06-15 NOTE — Telephone Encounter (Signed)
Last seen little over one year ago Needs a clinic appointment

## 2014-06-15 NOTE — Telephone Encounter (Signed)
Patient called and stated she felt dizzy and light headed today at work  She also feels like she might be having afib/palpitations  I asked which medications she has taken today  She informed me that she stopped all of her medications months ago because they "made her feel terrible"  I informed her that I would inform Dr. Rockey Situ  Instructed her to go to the ED if she felt her situation became emergent

## 2014-06-15 NOTE — Telephone Encounter (Signed)
Pt states she is having high BP 147/83. Please call, states she is feeling dizzy and palpitations over the weekend. Pt will be at work ask for the lab

## 2014-06-16 NOTE — Telephone Encounter (Signed)
Left message for pt to call and sched appt at her convenience.

## 2014-06-17 ENCOUNTER — Ambulatory Visit (INDEPENDENT_AMBULATORY_CARE_PROVIDER_SITE_OTHER): Payer: BC Managed Care – PPO | Admitting: Cardiovascular Disease

## 2014-06-17 ENCOUNTER — Encounter: Payer: Self-pay | Admitting: Cardiovascular Disease

## 2014-06-17 VITALS — BP 120/80 | HR 75 | Ht 65.0 in | Wt 179.0 lb

## 2014-06-17 DIAGNOSIS — R002 Palpitations: Secondary | ICD-10-CM

## 2014-06-17 DIAGNOSIS — I480 Paroxysmal atrial fibrillation: Secondary | ICD-10-CM

## 2014-06-17 MED ORDER — FLECAINIDE ACETATE 50 MG PO TABS: 50.0000 mg | ORAL_TABLET | Freq: Two times a day (BID) | ORAL | Status: DC

## 2014-06-17 MED ORDER — METOPROLOL TARTRATE 25 MG PO TABS
25.0000 mg | ORAL_TABLET | Freq: Two times a day (BID) | ORAL | Status: DC | PRN
Start: 2014-06-17 — End: 2016-11-06

## 2014-06-17 NOTE — Patient Instructions (Addendum)
Your next appointment will be scheduled in our new office located at :  [place]  [address], [place], [place] [address]   For atrial fibrillation, Take flecainide 1 to 2 pills as needed If atrial fib persists, Take 1/2 up to one whole metoprolol up to twice a day  Please call us if you have new issues that need to be addressed before your next appt.  Your physician wants you to follow-up in: 6 months.  You will receive a reminder letter in the mail two months in advance. If you don't receive a letter, please call our office to schedule the follow-up appointment.

## 2014-06-17 NOTE — Progress Notes (Signed)
[name], female    DOB: 1966-05-29, 48 y.o.   MRN: [id number]  HPI Comments: [name] [name] is a 48 year old woman with history of GERD, presenting to the hospital 03/27/2013 with tachycardia, shortness of breath. Diagnosed with atrial fibrillation with RVR, heart rate 140s. She was started on Cardizem, beta blocker with improved heart rate control, converting to normal sinus rhythm with short runs of tachycardia. She was discharged on Cardizem 4 times a day, metoprolol 25 mg twice a day.  On a prior clinic visit she was started on flecainide 50 mg twice a day with low-dose metoprolol 25 mg twice a day. On this regimen, she reports that she has been doing well.   In followup today, she has stopped all of her meds in May 2015,  She was doing well until October 2015, when she started having sx of fluttering, hard heart beats. Last weekend, she was having arrhythmia for a full day. Also had headache. She has had other medication changes, recently started on lamictal and ativan.  Still on zolft.   Echocardiogram August 2014 showing normal LV systolic function, LV diastolic relaxation abnormality, normal right ventricular systolic pressure TSH in the hospital was normal, 1.9  EKG shows NSR with rate 75 bpm    Outpatient Encounter Prescriptions as of 06/17/2014  Medication Sig  . ALPRAZolam (XANAX) 0.5 MG tablet Take 0.5 mg by mouth at bedtime as needed.   Marland Kitchen ibuprofen (ADVIL,MOTRIN) 200 MG tablet Take 200 mg by mouth every 8 (eight) hours as needed.   . lamoTRIgine (LAMICTAL) 100 MG tablet Take 100 mg by mouth daily.  Marland Kitchen omeprazole (PRILOSEC) 20 MG capsule Take 20 mg by mouth daily.  . sertraline (ZOLOFT) 100 MG tablet Take 100 mg by mouth daily.    Review of Systems  Constitutional: Negative.   HENT: Negative.   Eyes: Negative.   Respiratory: Negative.   Cardiovascular: Positive for palpitations.  Gastrointestinal: Negative.   Endocrine: Negative.   Musculoskeletal:  Negative.   Skin: Negative.   Allergic/Immunologic: Negative.   Neurological: Negative.   Hematological: Negative.   Psychiatric/Behavioral: Negative.   All other systems reviewed and are negative.   BP 120/80  Pulse 75  Ht 5\' 5"  (1.651 m)  Wt 179 lb (81.194 kg)  BMI 29.79 kg/m2  Physical Exam  Nursing note and vitals reviewed. Constitutional: She is oriented to person, place, and time. She appears well-developed and well-nourished.  HENT:  Head: Normocephalic.  Nose: Nose normal.  Mouth/Throat: Oropharynx is clear and moist.  Eyes: Conjunctivae are normal. Pupils are equal, round, and reactive to light.  Neck: Normal range of motion. Neck supple. No JVD present.  Cardiovascular: Normal rate, regular rhythm, S1 normal, S2 normal, normal heart sounds and intact distal pulses.  Exam reveals no gallop and no friction rub.   No murmur heard. Pulmonary/Chest: Effort normal and breath sounds normal. No respiratory distress. She has no wheezes. She has no rales. She exhibits no tenderness.  Abdominal: Soft. Bowel sounds are normal. She exhibits no distension. There is no tenderness.  Musculoskeletal: Normal range of motion. She exhibits no edema and no tenderness.  Lymphadenopathy:    She has no cervical adenopathy.  Neurological: She is alert and oriented to person, place, and time. Coordination normal.  Skin: Skin is warm and dry. No rash noted. No erythema.  Psychiatric: She has a normal mood and affect. Her behavior is normal. Judgment and thought content normal.    Assessment and Plan

## 2014-06-17 NOTE — Assessment & Plan Note (Signed)
She has had paroxysmal palpitations, hard beats concerning for recurrent arrhythmia. We have recommended she restart her flecainide 50 mg twice a day. She reports having dizziness, possibly from the metoprolol. Suggested she start one half pill twice a day. Anticoagulation was discussed with her. She does not feel that she is having frequent symptoms and does not want anticoagulation at this time

## 2014-06-22 ENCOUNTER — Encounter: Payer: Self-pay | Admitting: Cardiovascular Disease

## 2014-07-10 IMAGING — CR CERVICAL SPINE - 2-3 VIEW
1 series · 3 of 3 positions shown · non-contrast
Comparison: none

REASON FOR EXAM: Neck and Shoulder Pain, FAX TO [REDACTED]
COMMENTS:

PROCEDURE:     DXR - DXR C- SPINE AP AND LATERAL  - February 03, 2013 [DATE]
RESULT:     Technique: Cervical spine series was obtained.

[Series 1: w cervical spine ap · 0.14mm/px · 3 of 3 slices shown]
[im 1/3]
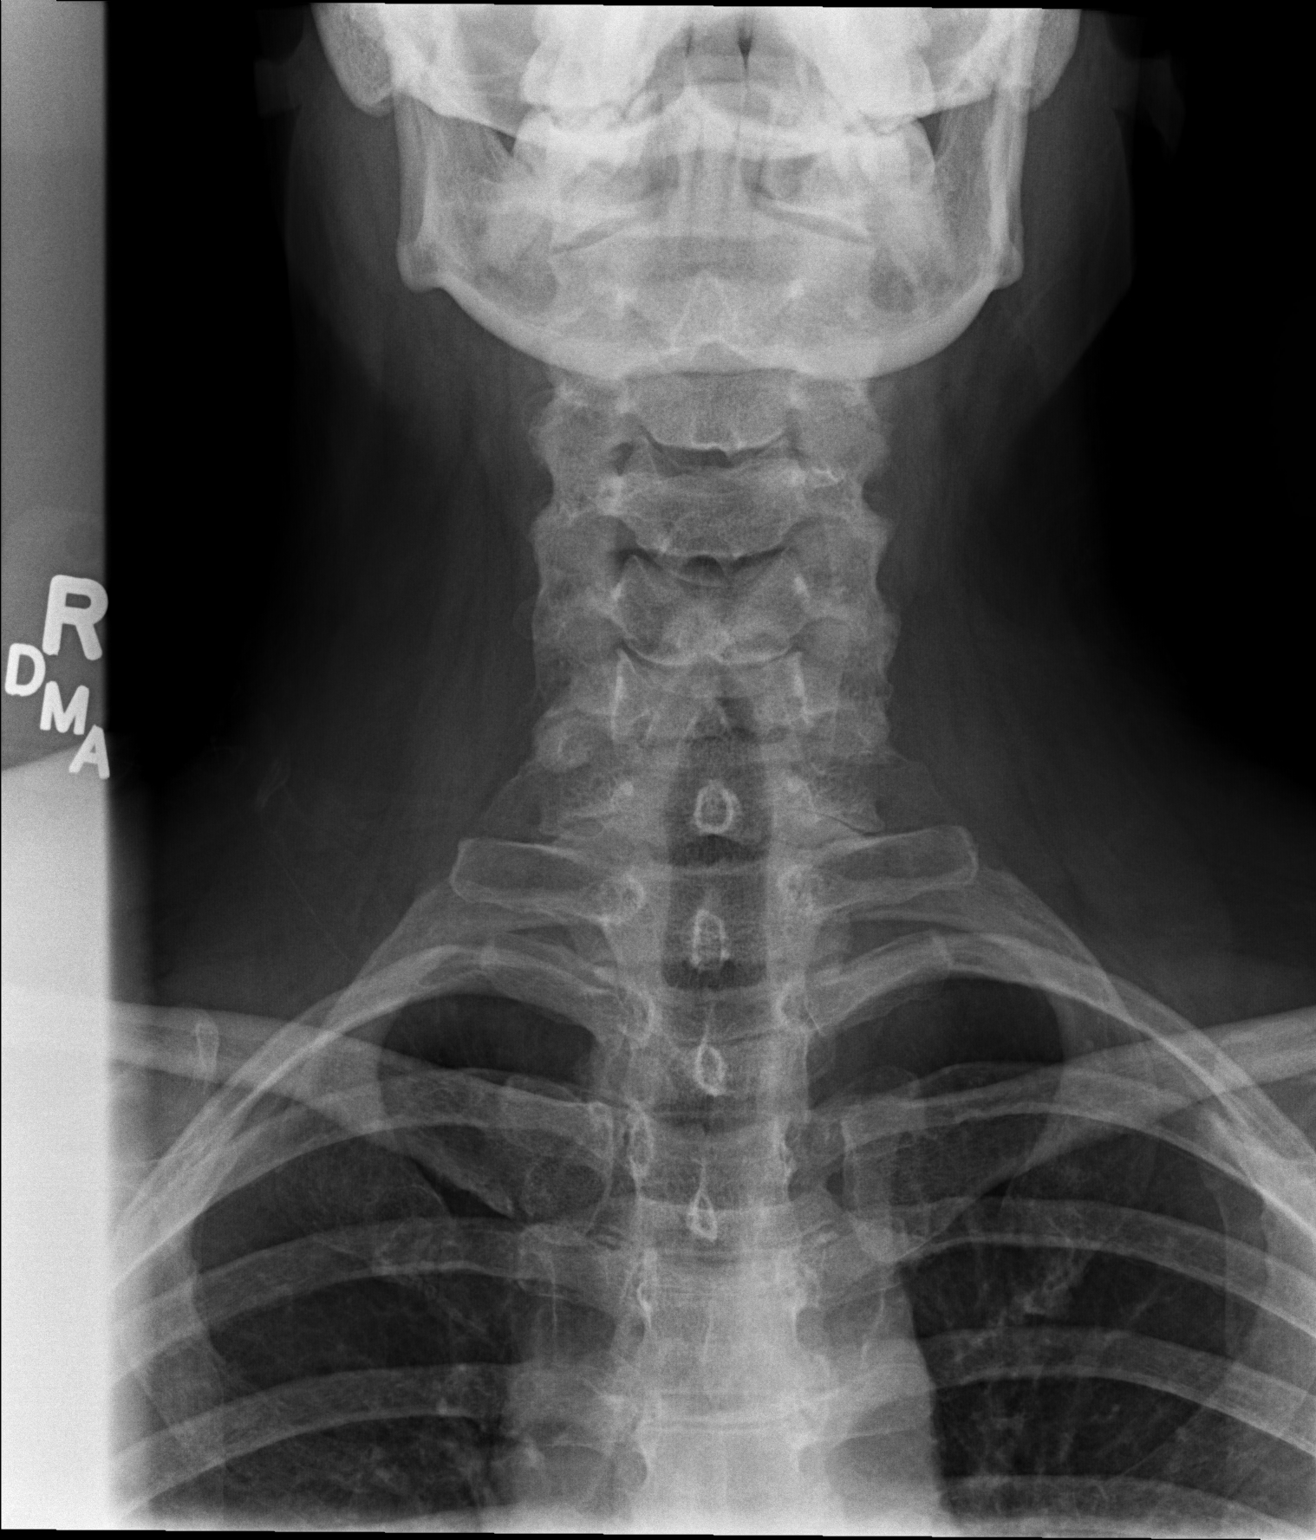
[im 2/3]
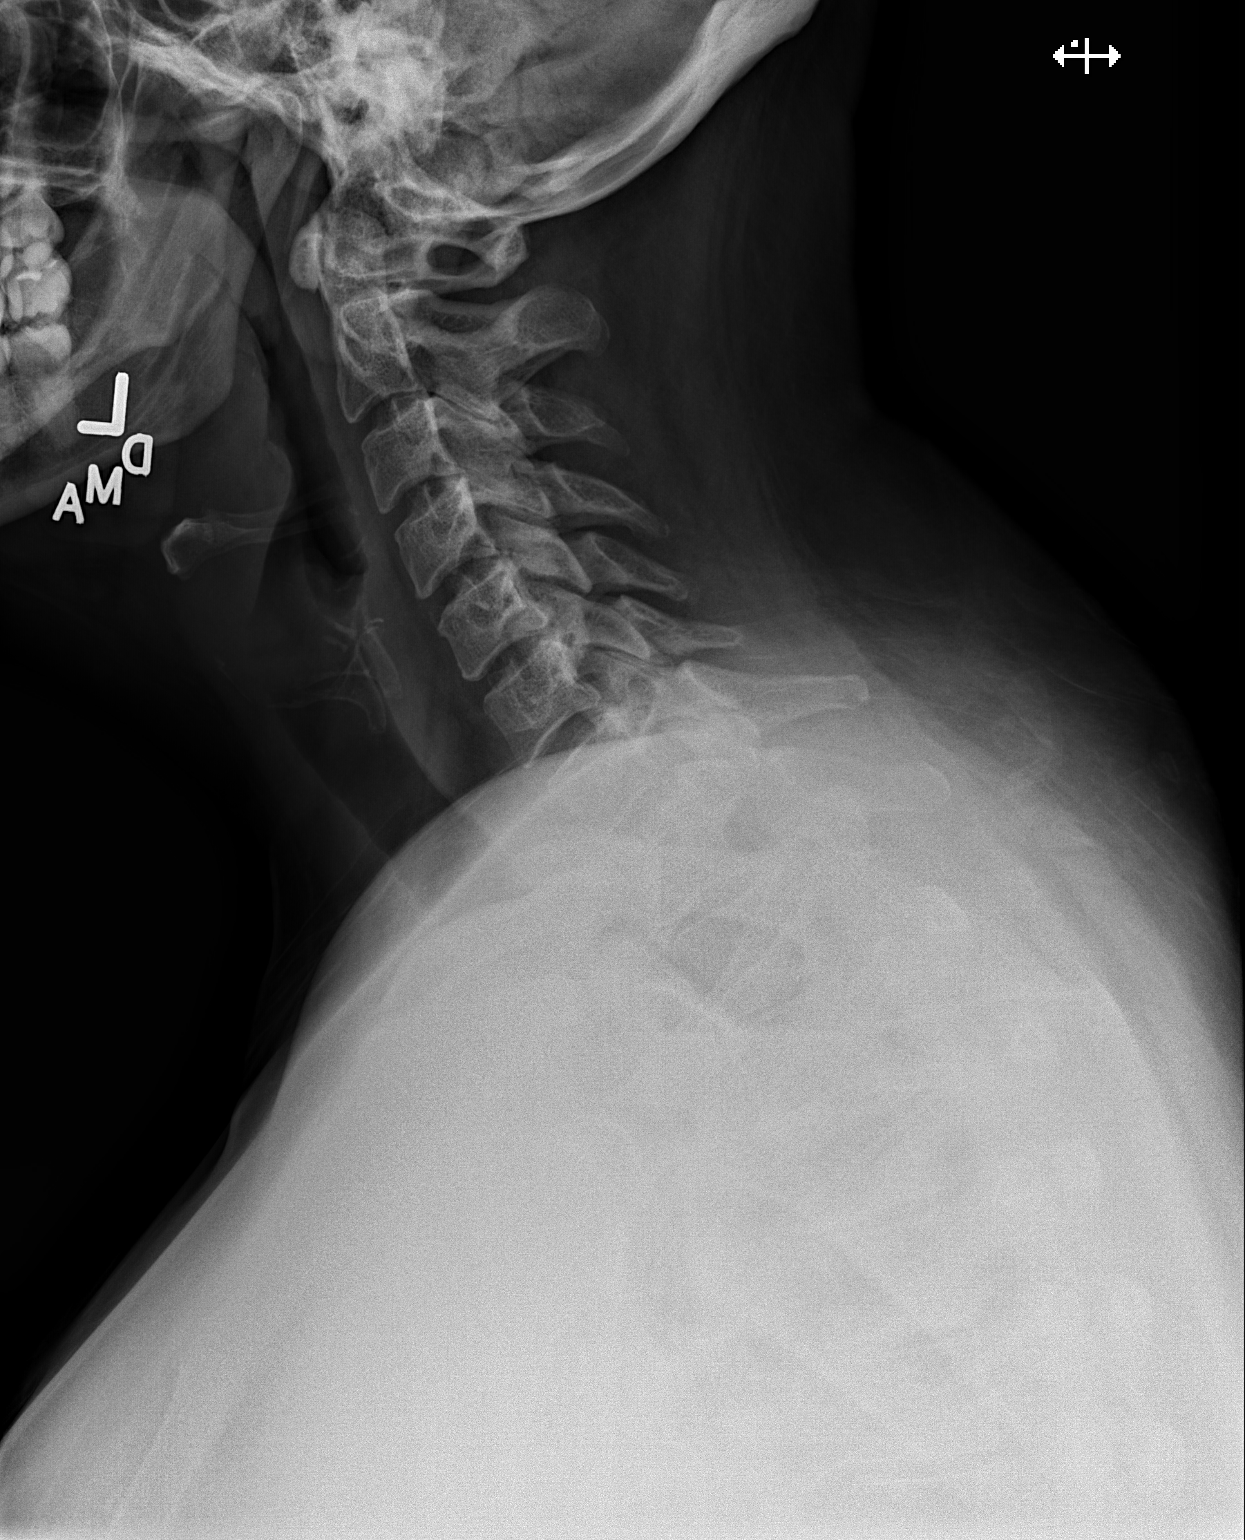
[im 3/3]
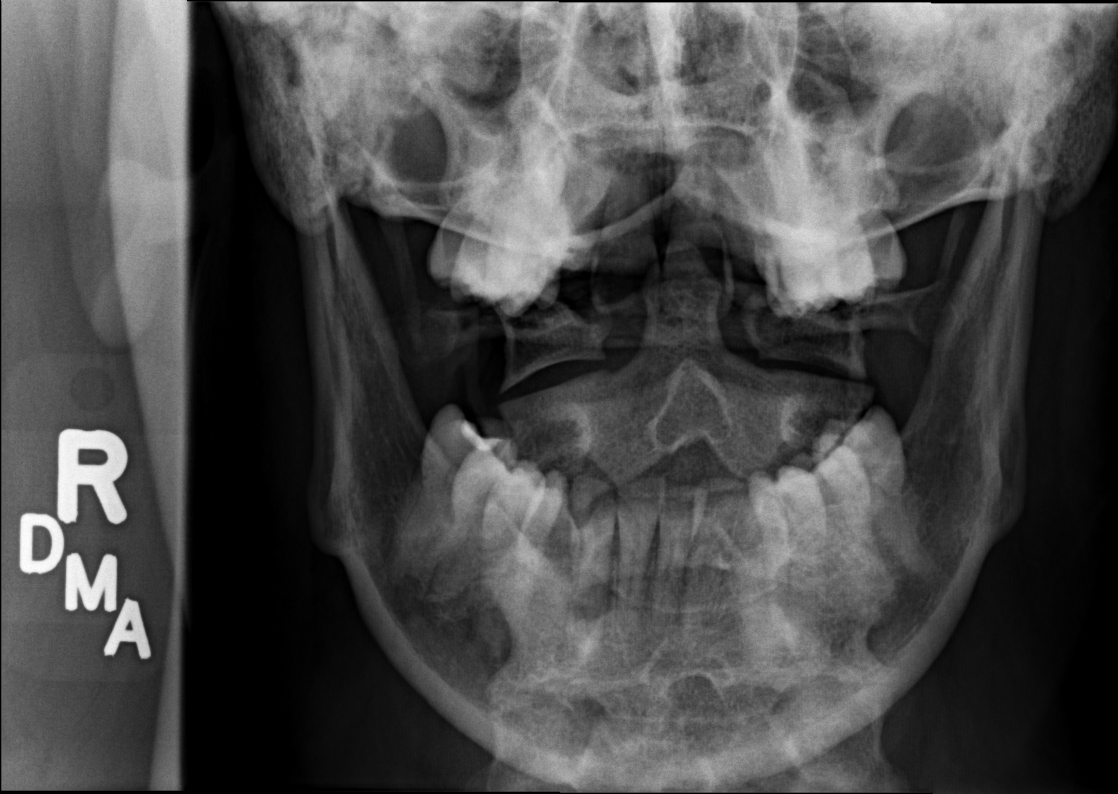

[3 of 3 positions shown; findings below may reference images not displayed]

FINDINGS: There is no evidence of fracture, dislocation nor malalignment.
There is no evidence of prevertebral soft tissue swelling.
IMPRESSION: No evidence of acute osseous abnormalities.

## 2014-09-10 DIAGNOSIS — M549 Dorsalgia, unspecified: Secondary | ICD-10-CM | POA: Insufficient documentation

## 2014-11-25 LAB — DRUG SCREEN, URINE
Amphetamines, Ur Screen: NEGATIVE
BARBITURATES, UR SCREEN: NEGATIVE
Benzodiazepine, Ur Scrn: NEGATIVE
COCAINE METABOLITE, UR ~~LOC~~: NEGATIVE
Cannabinoid 50 Ng, Ur ~~LOC~~: POSITIVE
MDMA (ECSTASY) UR SCREEN: NEGATIVE
Methadone, Ur Screen: NEGATIVE
Opiate, Ur Screen: NEGATIVE
Phencyclidine (PCP) Ur S: NEGATIVE
Tricyclic, Ur Screen: NEGATIVE

## 2014-11-25 LAB — COMPREHENSIVE METABOLIC PANEL
ALT: 13 U/L — AB
Albumin: 4.1 g/dL
Alkaline Phosphatase: 61 U/L
Anion Gap: 5 — ABNORMAL LOW (ref 7–16)
BILIRUBIN TOTAL: 0.4 mg/dL
BUN: 10 mg/dL
CHLORIDE: 105 mmol/L
CO2: 28 mmol/L
Calcium, Total: 10 mg/dL
Creatinine: 0.81 mg/dL
EGFR (African American): >60
EGFR (Non-African Amer.): >60
GLUCOSE: 98 mg/dL
Potassium: 3.7 mmol/L
SGOT(AST): 15 U/L
SODIUM: 138 mmol/L
TOTAL PROTEIN: 7.2 g/dL

## 2014-11-25 LAB — CBC
HCT: 36.9 % (ref 35.0–47.0)
HGB: 11.4 g/dL — ABNORMAL LOW (ref 12.0–16.0)
MCH: 23.7 pg — ABNORMAL LOW (ref 26.0–34.0)
MCHC: 30.9 g/dL — ABNORMAL LOW (ref 32.0–36.0)
MCV: 77 fL — AB (ref 80–100)
Platelet: 350 10*3/uL (ref 150–440)
RBC: 4.83 10*6/uL (ref 3.80–5.20)
RDW: 22.4 % — ABNORMAL HIGH (ref 11.5–14.5)
WBC: 6.5 10*3/uL (ref 3.6–11.0)

## 2014-11-25 LAB — URINALYSIS, COMPLETE
Bacteria: NONE SEEN
Bilirubin,UR: NEGATIVE
Blood: NEGATIVE
GLUCOSE, UR: NEGATIVE mg/dL (ref 0–75)
KETONE: NEGATIVE
NITRITE: NEGATIVE
Ph: 6 (ref 4.5–8.0)
Protein: NEGATIVE
RBC,UR: <1 /HPF (ref 0–5)
SPECIFIC GRAVITY: 1.003 (ref 1.003–1.030)
Squamous Epithelial: 1
WBC UR: 2 /HPF (ref 0–5)

## 2014-11-25 LAB — SALICYLATE LEVEL

## 2014-11-25 LAB — ACETAMINOPHEN LEVEL

## 2014-11-25 LAB — ETHANOL: Ethanol: <5 mg/dL

## 2014-11-26 ENCOUNTER — Inpatient Hospital Stay
Admit: 2014-11-26 | Disposition: A | Discharge status: Home / Self Care | Payer: Self-pay | Attending: Psychiatry | Admitting: Psychiatry

## 2014-12-02 ENCOUNTER — Ambulatory Visit: Payer: Self-pay | Admitting: Nurse Practitioner

## 2014-12-10 ENCOUNTER — Ambulatory Visit (INDEPENDENT_AMBULATORY_CARE_PROVIDER_SITE_OTHER): Payer: BLUE CROSS/BLUE SHIELD | Admitting: Nurse Practitioner

## 2014-12-10 ENCOUNTER — Encounter: Payer: Self-pay | Admitting: Nurse Practitioner

## 2014-12-10 VITALS — BP 126/72 | HR 90 | Temp 97.9°F | Resp 16 | Ht 65.0 in | Wt 178.8 lb

## 2014-12-10 DIAGNOSIS — F3130 Bipolar disorder, current episode depressed, mild or moderate severity, unspecified: Secondary | ICD-10-CM | POA: Diagnosis not present

## 2014-12-10 DIAGNOSIS — Z7189 Other specified counseling: Secondary | ICD-10-CM | POA: Diagnosis not present

## 2014-12-10 DIAGNOSIS — K219 Gastro-esophageal reflux disease without esophagitis: Secondary | ICD-10-CM

## 2014-12-10 DIAGNOSIS — G43109 Migraine with aura, not intractable, without status migrainosus: Secondary | ICD-10-CM

## 2014-12-10 DIAGNOSIS — M546 Pain in thoracic spine: Secondary | ICD-10-CM

## 2014-12-10 DIAGNOSIS — M549 Dorsalgia, unspecified: Secondary | ICD-10-CM | POA: Insufficient documentation

## 2014-12-10 DIAGNOSIS — D259 Leiomyoma of uterus, unspecified: Secondary | ICD-10-CM | POA: Diagnosis not present

## 2014-12-10 DIAGNOSIS — G43909 Migraine, unspecified, not intractable, without status migrainosus: Secondary | ICD-10-CM | POA: Insufficient documentation

## 2014-12-10 DIAGNOSIS — I480 Paroxysmal atrial fibrillation: Secondary | ICD-10-CM

## 2014-12-10 DIAGNOSIS — Z7689 Persons encountering health services in other specified circumstances: Secondary | ICD-10-CM | POA: Insufficient documentation

## 2014-12-10 NOTE — Assessment & Plan Note (Signed)
Back pain stable. Seeing ortho at [facility].

## 2014-12-10 NOTE — Assessment & Plan Note (Signed)
Currently stable on Lamictal and Saphris 5 mg. Seeing Dr. Candis Schatz for follow up.

## 2014-12-10 NOTE — Progress Notes (Signed)
Subjective:    [name], female    DOB: June 18, 1966, 49 y.o.   MRN: [id number]  HPI  [name] is a 49 yo female establishing care today.   1) Health Maintenance-   Immunizations- Will obtain records   Mammogram- Seen at [facility]   Pap-  2015 [facility], went back to [facility] Exam- UTD  Dental Exam- UTD  LMP- Last week see [facility]  2) Chronic Problems-  Fibroids- ablation, novasure, or mirena are the options    Periods light on the first few days and heavy for 4-5 more days. Pt wanted to discuss options today.   Bipolar- Lamictal is helpful, Started Saphris 5 mg and does not  like the way it makes her feel, May 4th next visit with   Psychiatrist in [place], [place]- Dr. Candis Schatz   GERD- Prilosec helpful, on it for 15 years   Back pain- went to [facility] ortho, DDD and spondylosis   Migraines- 2-3 a year with aura, if she takes medication at onset   it is bearable (ibuprofen/excedrin migraine)   Afib- flecainide available. 1 episode in past, no recent episodes  Mike[facility] Hawthorne- Lab work basic recently   Review of Systems  Constitutional: Negative for fever, chills, diaphoresis and fatigue.  Eyes: Negative for visual disturbance.  Respiratory: Negative for chest tightness, shortness of breath and wheezing.   Cardiovascular: Negative for chest pain, palpitations and leg swelling.  Gastrointestinal: Negative for nausea, vomiting and diarrhea.  Musculoskeletal: Positive for arthralgias.       Seeing someone at [facility].   Skin: Negative for rash.  Neurological: Negative for dizziness, weakness, numbness and headaches.  Psychiatric/Behavioral: Negative for suicidal ideas, hallucinations, behavioral problems, confusion, sleep disturbance, dysphoric mood, decreased concentration and agitation. The patient is not nervous/anxious and is not hyperactive.    Past Medical History  Diagnosis Date  . GERD (gastroesophageal reflux disease)   . A-fib   .  Arthritis   . Depression   . Chicken pox   . Migraines     History   Social History  . Marital Status: Married    Spouse Name: N/A  . Number of Children: N/A  . Years of Education: N/A   Occupational History  . Not on file.   Social History Main Topics  . Smoking status: Former Smoker -- 1.00 packs/day for 10 years    Types: Cigarettes    Quit date: 08/22/1991  . Smokeless tobacco: Never Used  . Alcohol Use: 0.0 oz/week    0 Standard drinks or equivalent per week     Comment: twice a year   . Drug Use: No  . Sexual Activity:    Partners: Male     Comment: Husband   Other Topics Concern  . Not on file   Social History Narrative   [employer]    Lives with husband and 2 children    Son- 19 yo and daughter 53 yo    Pets: 2 dogs    Caffeine- 2 sodas 20 oz, coffee 1 cup occasionally    Enjoys being outside with family     Past Surgical History  Procedure Laterality Date  . Appendectomy  5/11  . Cesarean section  10/98,9/12  . Tonsillectomy  03/2008    Family History  Problem Relation Age of Onset  . Diabetes Mother   . Alcohol abuse Father   . Arthritis Father   .  Heart disease Father   . Arthritis Sister   . Alcohol abuse Maternal Aunt   . Alcohol abuse Maternal Uncle   . Arthritis Maternal Grandmother   . Schizophrenia Maternal Grandmother   . Alcohol abuse Paternal Grandfather     Allergies  Allergen Reactions  . Augmentin [Amoxicillin-Pot Clavulanate] Nausea Only  . Codeine Nausea Only  . Latex Rash  . Percocet [Oxycodone-Acetaminophen] Rash    Current Outpatient Prescriptions on File Prior to Visit  Medication Sig Dispense Refill  . ALPRAZolam (XANAX) 0.5 MG tablet Take 0.5 mg by mouth at bedtime as needed.     . flecainide (TAMBOCOR) 50 MG tablet Take 1 tablet (50 mg total) by mouth 2 (two) times daily. 60 tablet 11  . ibuprofen (ADVIL,MOTRIN) 200 MG tablet Take 200 mg by mouth every 8 (eight) hours as  needed.     . lamoTRIgine (LAMICTAL) 100 MG tablet Take 100 mg by mouth daily.    Marland Kitchen omeprazole (PRILOSEC) 20 MG capsule Take 20 mg by mouth daily.    . metoprolol tartrate (LOPRESSOR) 25 MG tablet Take 1 tablet (25 mg total) by mouth 2 (two) times daily as needed. (Patient not taking: Reported on 12/10/2014) 60 tablet 11  . sertraline (ZOLOFT) 100 MG tablet Take 100 mg by mouth daily.     No current facility-administered medications on file prior to visit.      Objective:   Physical Exam  Constitutional: She is oriented to person, place, and time. She appears well-developed and well-nourished. No distress.  BP 126/72 mmHg  Pulse 90  Temp(Src) 97.9 F (36.6 C) (Oral)  Resp 16  Ht 5\' 5"  (1.651 m)  Wt 178 lb 12.8 oz (81.103 kg)  BMI 29.75 kg/m2  SpO2 98%  LMP 12/03/2014    HENT:  Head: Normocephalic and atraumatic.  Right Ear: External ear normal.  Left Ear: External ear normal.  Mouth/Throat: No oropharyngeal exudate.  Eyes: EOM are normal. Pupils are equal, round, and reactive to light. Right eye exhibits no discharge. Left eye exhibits no discharge. No scleral icterus.  Neck: Normal range of motion. Neck supple. No thyromegaly present.  Cardiovascular: Normal rate, regular rhythm, normal heart sounds and intact distal pulses.  Exam reveals no gallop and no friction rub.   No murmur heard. Pulmonary/Chest: Effort normal and breath sounds normal. No respiratory distress. She has no wheezes. She has no rales. She exhibits no tenderness.  Abdominal: Soft. Bowel sounds are normal. She exhibits no distension and no mass. There is no tenderness. There is no rebound and no guarding.  Musculoskeletal: Normal range of motion. She exhibits no edema or tenderness.  Mid back is where she feels the pain intermittently, non-tender today  Lymphadenopathy:    She has no cervical adenopathy.  Neurological: She is alert and oriented to person, place, and time. No cranial nerve deficit. She  exhibits normal muscle tone. Coordination normal.  Skin: Skin is warm and dry. No rash noted. She is not diaphoretic.  Psychiatric: She has a normal mood and affect. Her behavior is normal. Judgment and thought content normal.  Good eye contact today, not tearful      Assessment & Plan:

## 2014-12-10 NOTE — Assessment & Plan Note (Signed)
2-3 a year, controlled with ibuprofen and Excedrin migraine. Has visual auras.

## 2014-12-10 NOTE — Patient Instructions (Addendum)
Welcome to [facility]! 

## 2014-12-10 NOTE — Assessment & Plan Note (Signed)
[facility] and has fibroids. She would like to have the novasure procedure. She has another visit in 2 weeks to discuss further. Will follow.

## 2014-12-10 NOTE — Assessment & Plan Note (Signed)
Stable on Prilosec. Will follow.

## 2014-12-10 NOTE — Progress Notes (Signed)
Pre visit review using our clinic review tool, if applicable. No additional management support is needed unless otherwise documented below in the visit note. 

## 2014-12-10 NOTE — Assessment & Plan Note (Signed)
Stable. 1 episode in past. Has flecainide in case of another episode. Followed by Dr. Rockey Situ.

## 2014-12-11 NOTE — Consult Note (Signed)
General Aspect 49 year old white female who has a hx of GERD, presents with tachycardia, palpitations, SOB.   Last Thursday she noticed that her heart was have palpitations, extra rapid beats off and on for 20 to 30 minutes. COntinued sx over the next few days, over the weekend. Yesterday, she had significant tachycardia that did not resolve.  she decided to come to the ED.She does report significant stress last week while on vacation.    In the ER, her heart rate was in the 140s, atrial fibrillation. She received IV Cardizem. Heart rate slowed down and then increased back into the 105-to-110 range. The patient reports that she has not had any chest pain but has some shortness of breath and has felt tired. She drinks about 2 to 3 Cokes a day and drinks 1 to 2 cups of coffee.   She otherwise denies any lower extremity swelling. No calf pain. No PND. No orthopnea. No abdominal pain. No nausea, vomiting, diarrhea or fevers.    PAST SURGICAL HISTORY: Status post D and C, status post C-section, and status post tonsillectomy.   ALLERGIES:  CODEINE, PERCOCET, NEOMYCIN.   CURRENT MEDICATIONS AT HOME: She is on omeprazole 20 daily, Zoloft 100 daily.   SOCIAL HISTORY: History of smoking, quit. Drinks socially. No drug use. Works in Scientist, research (medical).   FAMILY HISTORY: There is positive for an MI in her father but he was a heavy smoker and a drinker.   Physical Exam:  GEN well developed, well nourished, no acute distress, obese   HEENT PERRL, hearing intact to voice   NECK supple   RESP normal resp effort  clear BS   CARD Regular rate and rhythm  No murmur  ectopy   ABD denies tenderness  soft   LYMPH negative neck   EXTR negative edema   SKIN normal to palpation   NEURO motor/sensory function intact   PSYCH alert, A+O to time, place, person, good insight     depression:    acid reflex:    D&C:    c-section:    tonsillectomy:        Admit Diagnosis:   AFIB: Onset Date:  27-Mar-2013, Status: Active, Description: AFIB  Home Medications: Medication Instructions Status  Metoprolol Tartrate 25 mg oral tablet 1 tab(s) orally 2 times a day and take one additional if feels palpitation/tachycardic Active  Cardizem 30 mg oral tablet 1 tab(s) orally 4 times a day  Active  omeprazole 20 mg oral delayed release capsule 1 cap(s) orally once a day Active  Zoloft 100 mg oral tablet 1 tab(s) orally once a day Active   Lab Results: Routine Chem:  07-Aug-14 05:00   Cholesterol, Serum 173  Triglycerides, Serum 84  HDL (INHOUSE) 45  VLDL Cholesterol Calculated 17  LDL Cholesterol Calculated  111 (Result(s) reported on 27 Mar 2013 at 06:32AM.)  Glucose, Serum 98  BUN 8  Creatinine (comp) 0.76  Sodium, Serum 140  Potassium, Serum 3.6  Chloride, Serum 107  CO2, Serum 26  Calcium (Total), Serum 8.5  Anion Gap 7  Osmolality (calc) 278  eGFR (African American) >60  eGFR (Non-African American) >60 (eGFR values <18m/min/1.73 m2 may be an indication of chronic kidney disease (CKD). Calculated eGFR is useful in patients with stable renal function. The eGFR calculation will not be reliable in acutely ill patients when serum creatinine is changing rapidly. It is not useful in  patients on dialysis. The eGFR calculation may not be applicable to patients at the low  and high extremes of body sizes, pregnant women, and vegetarians.)  Cardiac:  07-Aug-14 05:00   CK, Total 56  CPK-MB, Serum 0.8 (Result(s) reported on 27 Mar 2013 at 08:41AM.)  Troponin I < 0.02 (0.00-0.05 0.05 ng/mL or less: NEGATIVE  Repeat testing in 3-6 hrs  if clinically indicated. >0.05 ng/mL: POTENTIAL  MYOCARDIAL INJURY. Repeat  testing in 3-6 hrs if  clinically indicated. NOTE: An increase or decrease  of 30% or more on serial  testing suggests a  clinically important change)  Routine Hem:  07-Aug-14 05:00   WBC (CBC) 6.2  RBC (CBC) 4.60  Hemoglobin (CBC) 12.1  Hematocrit (CBC) 36.2   Platelet Count (CBC) 273  MCV  79  MCH 26.3  MCHC 33.3  RDW  15.8  Neutrophil % 49.3  Lymphocyte % 39.7  Monocyte % 6.9  Eosinophil % 3.6  Basophil % 0.5  Neutrophil # 3.0  Lymphocyte # 2.4  Monocyte # 0.4  Eosinophil # 0.2  Basophil # 0.0 (Result(s) reported on 27 Mar 2013 at [facility].)   Radiology Results: XRay:    06-Aug-14 15:56, Chest Portable Single View  Chest Portable Single View   REASON FOR EXAM:    new onset afib  COMMENTS:       PROCEDURE: DXR - DXR PORTABLE CHEST SINGLE VIEW  - Mar 26 2013  3:56PM     RESULT: The lungs are adequately inflated. There is no focal infiltrate.   There is mild soft tissue fullness in the right paratracheal region. The   cardiac silhouette is top normal in size. The pulmonary vascularity is   not engorged. There is no pleural effusion or pneumothorax.    IMPRESSION:  There is no evidence of CHF nor of pneumonia. Mild soft   tissue fullness in the lower right paratracheal region may reflect venous   structures. A followup PA and lateral chest x-ray is recommended.     Dictation Site: 2    Verified By: DAVID A. Martinique, M.D., MD    Percocet: Rash  Codeine: N/V/Diarrhea  Neosporin: Blisters, Rash  Mercury: Rash, Other  Latex: Other, Hives  Vital Signs/Nurse's Notes: **Vital Signs.:   07-Aug-14 13:47  Vital Signs Type Routine  Temperature Temperature (F) 98.6  Celsius 37  Temperature Source oral  Pulse Pulse 60  Respirations Respirations 18  Systolic BP Systolic BP 979  Diastolic BP (mmHg) Diastolic BP (mmHg) 79  Mean BP 93  Pulse Ox % Pulse Ox % 92  Pulse Ox Activity Level  At rest  Oxygen Delivery Room Air/ 21 %    Impression 49 year old white female who has a hx of GERD, presents with tachycardia, palpitations, SOB.  EKG and tele showing atrial fibrillation.  1) Atrial fibrillation: Started on diltiazem IV then changed to 60 mg po Q8 hours. BP running low. rhythm converted to NSR wit short runs of atrial  tachycardia  --Prelim echo showing normal EF --Would continue diltiazem 30 mg po QID Start metoprolol 25 mg po BID Hold on anticoagulation at this time. Close follow up in clinic in 1 to 2 weeks  2) Stress/anxiety caould have played a role in new onset artrial fib  3) GERD would stay on PPI   Electronic Signatures: Ida Rogue (MD)  (Signed 07-Aug-14 15:00)  Authored: General Aspect/Present Illness, History and Physical Exam, Past Medical History, Health Issues, Home Medications, Labs, Radiology, Allergies, Vital Signs/Nurse's Notes, Impression/Plan   Last Updated: 07-Aug-14 15:00 by Ida Rogue (MD)

## 2014-12-11 NOTE — H&P (Signed)
PATIENT NAME:  [name] [name], [name] [name] MR#:  [id number] DATE OF BIRTH:  05-24-1966  PRIMARY CARE PROVIDER: None.   EMERGENCY ROOM  REFERRING PHYSICIAN: Dr. Robet Leu.   CHIEF COMPLAINT: Atrial fibrillation with rapid ventricular response.   HISTORY OF PRESENT ILLNESS: The patient is a 49 year old white female who reports that last Thursday she noticed that her heart was keeping beat and this continued to happen for 20 to 30 minutes, and it kept happening. Then today, her heart started beating more and more and  much faster. Therefore, she decided to come to the ED. In the ER, her heart rate was in the 120s. She received IV Cardizem. Heart rate slowed down and then again now going back into the 105-to-110 range. The patient reports that she has not had any chest pain but has some shortness of breath and has felt tired. She drinks about 2 to 3 Cokes a day and drinks 1 to 2 coffee a day.   She otherwise denies any lower extremity swelling. No calf pain. No PND. No orthopnea. No abdominal pain. No nausea, vomiting, diarrhea or fevers.   PAST MEDICAL HISTORY:  Significant for: 1.  Depression.  2.  GERD.  3.  History of mitral valve prolapse.   PAST SURGICAL HISTORY: Status post D and C, status post C-section, and status post tonsillectomy.   ALLERGIES:  CODEINE, PERCOCET, NEOMYCIN.   CURRENT MEDICATIONS AT HOME: She is on omeprazole 20 daily, Zoloft 100 daily.   SOCIAL HISTORY: History of smoking, quit. Drinks socially. No drug use. Works in Scientist, research (medical).   FAMILY HISTORY: There is positive for an MI in her father but he was a heavy smoker and a drinker.   REVIEW OF SYSTEMS:  CONSTITUTIONAL: Denies any fevers. Complains of fatigue, weakness. No pain. No weight loss. No weight gain.  EYES: No blurred or double vision. No pain. No redness. No inflammation. No glaucoma. No cataracts.  ENT: No tinnitus. No ear pain. No hearing loss. No seasonal or year-around allergies. No epistaxis. No nasal discharge. No  snoring. No postnasal drip. No sinus pain. No difficulty swallowing.  RESPIRATORY: Denies any cough, wheezing, hemoptysis. No dyspnea. No asthma. No painful respiration. No COPD. CARDIOVASCULAR: Denies any chest pain, orthopnea, edema. Has Afib which is new now. Complains of palpitations. No dyspnea on exertion. No syncope.  GASTROINTESTINAL: No nausea, vomiting, diarrhea. No abdominal pain. No hematemesis. No melena. No ulcers. Has history of GERD.  GENITOURINARY: Denies any dysuria, hematuria, renal calc or frequency.  ENDOCRINE: Denies any polyuria, nocturia, or thyroid problems.  HEMATOLOGIC/LYMPHATIC: Denies anemia, easy bruisability or bleeding.  SKIN: No acne. No rash. No changes in mole, hair or skin.  MUSCULOSKELETAL: Denies any pain in the neck, back or shoulder. No gout.  NEUROLOGIC: No numbness. No CVA. No TIA. No seizures.  PSYCHIATRIC: Some depression. Has been under a lot of stress. No ADD. No OCD.   PHYSICAL EXAMINATION:  VITAL SIGNS: Temperature 98.4, pulse 143 on presentation, respirations 24, blood pressure 147/75, O2 98%.  GENERAL: An obese female in no acute distress.  HEENT: Head atraumatic, normocephalic. Pupils equally round, reactive to light and accommodation. There is no conjunctival pallor. No scleral icterus. Extraocular movements intact.  NOSE: No nasal lesions. No drainage.  EARS: No external erythema or drainage.  MOUTH: No exudates.  NECK: Supple and symmetric. No masses. Thyroid midline, not enlarged. No JVD.  RESPIRATORY: Good respiratory effort. Clear to auscultation bilaterally without any rales, rhonchi, or wheezing.  CARDIOVASCULAR: Irregularly irregular heart  rhythm. No murmurs, gallops, clicks or heaves.  ABDOMEN: Soft, nontender, nondistended. Positive bowel sounds x4.  GENITOURINARY: Deferred.  MUSCULOSKELETAL: No swelling or erythema.  SKIN: No rash.  LYMPHATICS: No lymph nodes palpable.  VASCULAR: Good DP, PT pulses.  NEUROLOGICAL: Cranial  nerves II through XII grossly intact. DTRs are 2+.  PSYCHIATRIC: Not depressed or anxious currently.   EVALUATIONS: WBC 9.1, hemoglobin 12.7, platelet count 349. Troponin less than 0.02. Glucose 115, BUN 10, creatinine 0.89, sodium 139, potassium 3.9, chloride 109. CO2 was 26. Calcium 9. TSH is 1.96. EKG shows Afib with RVR.   ASSESSMENT AND PLAN: The patient is a 49 year old white female with history of depression and gastroesophageal reflux disease who presents with palpitations, heart beating fast.  1.  Atrial fibrillation with rapid ventricular response. At this time, we will place the patient under  observation. We will start her on Cardizem p.o. since her heart rate is still labile. Will get an echocardiogram of the heart. Her TSH is already normal. We will monitor her on telemetry. We will check serial cardiac enzymes. We will also check a magnesium level. The patient's mother requests Dr. Fletcher Anon to see. Will ask him to see her. Her CHADS score is 0. Will place her on aspirin.  2.  Anxiety. Continue Zoloft.  3.  Gastroesophageal reflux disease. Continue omeprazole.  4.  Miscellaneous. The patient is ambulatory.   TIME SPENT: 45 minutes spent on this H and P.    ____________________________ Lafonda Mosses. Posey Pronto, MD shp:np D: 03/26/2013 17:06:40 ET T: 03/26/2013 18:27:13 ET JOB#: [id number]  cc: Caren Garske H. Posey Pronto, MD, <Dictator> Alric Seton MD ELECTRONICALLY SIGNED 03/31/2013 10:26

## 2014-12-11 NOTE — Discharge Summary (Signed)
PATIENT NAME:  [name] [name], [name] [name] MR#:  [id number] DATE OF BIRTH:  Apr 05, 1966  DATE OF ADMISSION:  03/26/2013 DATE OF DISCHARGE:  03/27/2013  DISCHARGING PHYSICIAN: Max Sane, MD    DISCHARGE DIAGNOSES:  1.  Atrial fibrillation with rapid ventricular rate. Now rate is well controlled with Cardizem and metoprolol. No need of anticoagulation yet due to low CHADS score.  2.  Anxiety, on Zoloft.  SECONDARY DIAGNOSES: 1.  Depression.  2.  Gastroesophageal reflux disease.  3.  History of mitral vault prolapse.   CONSULTATION: Cardiology, Dr. Rockey Situ.   PROCEDURES AND RADIOLOGY:  Chest x-ray on 08/06 showed no evidence of CHF or pneumonia.  A 2-D echocardiogram on 08/07 showed normal LV systolic function. Impaired relaxation pattern of LV diastolic filling. Mild mitral valve regurgitation. Mild tricuspid regurgitation.   HISTORY AND SHORT HOSPITAL COURSE: The patient is a 49 year old female with the above-mentioned medical problems who was admitted for new onset atrial fibrillation with rapid ventricular rate. Cardiology consultation was obtained with Dr. Rockey Situ. Please see Dr. Chana Bode Patel's dictated history and physical for further details. Dr. Rockey Situ recommended a 2-D echocardiogram which was obtained with results dictated above. He recommended rate control with metoprolol and Cardizem, which was obtained. The patient remained symptom-free. She also had converted to normal sinus rhythm and was discharged home in stable condition on 03/27/2013.    PERTINENT PHYSICAL EXAMINATION ON THE DATE OF DISCHARGE:  VITAL SIGNS:  Temperature 98.6, heart rate 60 per minute, respirations 18 per minute, blood pressure 121/79 mmHg.  She was saturating 97% on room air.   CARDIOVASCULAR: S1, S2 normal. No murmurs, rubs or gallop.  LUNGS: Clear to auscultation bilaterally. No wheezing, rales, rhonchi or crepitation.  ABDOMEN: Soft, benign.  NEUROLOGIC: Nonfocal examination.  All other physical examination  remained at baseline.   DISCHARGE MEDICATIONS:  1.  Zoloft 100 mg p.o. daily.  2.  Omeprazole 20 mg p.o. daily.  3.  Cardizem 30 mg p.o. 4 times a day.  4.  Metoprolol 25 mg p.o. b.i.d. and take an additional tablet if symptomatic with palpitations or tachycardia.   DISCHARGE DIET: Low-sodium.   DISCHARGE ACTIVITY: As tolerated.   DISCHARGE INSTRUCTIONS AND FOLLOWUP:  The patient was instructed to follow up with Dr. Ida Rogue from [facility] in 1 week.   TOTAL TIME DISCHARGING THIS PATIENT: 45 minutes.   ____________________________ Lucina Mellow. Manuella Ghazi, MD vss:cb D: 03/30/2013 16:47:37 ET T: 03/30/2013 23:24:04 ET JOB#: [id number]  cc: Tavoris Brisk S. Manuella Ghazi, MD, <Dictator> Minna Merritts, MD Lucina Mellow [facility] MD ELECTRONICALLY SIGNED 03/31/2013 17:54

## 2014-12-20 NOTE — Consult Note (Addendum)
PATIENT NAME:  [name] [name], HARBOLD MR#:  [id number] DATE OF BIRTH:  04-19-1966  DATE OF CONSULTATION:  11/26/2014  REFERRING PHYSICIAN:   CONSULTING PHYSICIAN:  Gonzella Lex, MD  IDENTIFYING INFORMATION AND REASON FOR CONSULTATION: This is a 49 year old woman with a past history of mood disorder who presented with overdose. Her chief complaint to me, "My mother drove me to this."  HISTORY OF PRESENT ILLNESS:  Information from the patient and from the chart and paperwork. When she came into the hospital last night she said that she wanted to die and had wanted to kill herself. She was saying at that time that she took 20-30 Xanax. Today she is playing it down. She tells me that she took an overdose of Xanax, but claims it was only about 10 pills. She blames her mother for it, saying that her mother came over to the house and was "picking at her" which made her so angry that she took the overdose. She admits that she then ran away and locked herself in the bathroom until her mother called the law. The patient is now minimizing it and suggest that she was not actually wanting to kill herself. She does admit that her mood has been much more upset recently. Feels more overwhelmed and stressed. Angry recently. She says she cries all the time and has been doing that ever since she started taking lamotrigine. She claims to sleep well and not have a change in her appetite. Denies any psychotic symptoms. Says that she is compliant with her medicine. Admits that she uses marijuana regularly, but denies drinking.   PAST PSYCHIATRIC HISTORY: The patient has a history of mood disorder and has had hospitalizations in the past for suicide attempts. She is followed up by Dr. Candis Schatz in [place]. He has her diagnosed as bipolar disorder and treats her with lamotrigine and Risperdal. She has been on Zoloft in the past. It is unclear than anything has made a very dramatic improvement.   SOCIAL HISTORY: The patient lives  with her husband, her daughter, and her son. Daughter is only 63 years old and has strep throat which is stressing the patient out. Her husband is also sick and her son has been diagnosed with bipolar disorder. She cites this as being all major stresses. She is continuing to work outside the home.   TOBACCO USE: Does not smoke.   PAST MEDICAL HISTORY: History of gastric reflux symptoms, history of chronic arthritis. No other ongoing medical problems.   FAMILY HISTORY: Had a grandmother who was psychotic, also has a family history of diabetes and coronary artery disease.   SUBSTANCE ABUSE HISTORY: Says she drinks very rarely and it has never been a problem. Admits that she uses marijuana about 2 times a week and it has been stable like that. Denies other drug use.   CURRENT MEDICATIONS: Lamotrigine 300 mg at night, Risperdal 1 mg at night, Prilosec  daily, and p.r.n., tramadol, Xanax 0.5 mg daily p.r.n. which she says she often does not take.   ALLERGIES: CODEINE, NEOSPORIN, PERCOCET, LATEX.   REVIEW OF SYSTEMS: The patient admits to feeling depressed, feeling anxious, worrying all the time, frequent crying spells. Denies hallucinations. Physically has some fatigue and low back pain, but the rest of the full review of systems is negative.   MENTAL STATUS EXAMINATION: Slightly disheveled woman, looks her stated age, who is cooperative with the interview. Eye contact intermittent. Psychomotor activity fidgety. Speech is normal rate, tone, and volume. Affect  appears to be very dysphoric, down, and flat. Mood is stated as being bad. Denies any suicidal or homicidal ideation. Denies any auditory or visual hallucinations. She appears to be a little coherent in her thinking with no sign of psychosis. She is able to repeat 3 words immediately, remembers all 3 at 3 minutes. She is alert and oriented x 4. Judgment and insight a little bit impaired by her erratic mood. Her baseline intelligence and fund of  knowledge are normal.   LABORATORY RESULTS: EKG is unremarkable. Chest x-ray unremarkable. Alcohol, acetaminophen, and salicylates negative. Chemistry panel, just a slightly low ALT, but otherwise all normal. CBC, low hemoglobin 11.4. Urinalysis normal. Drug screen is positive for cannabis.   VITAL SIGNS: Blood pressure is 152/80, respirations 20, pulse 109, temperature 98.8.   ASSESSMENT: This is a 49 year old woman with a history of mood disorder variously diagnosed as depression and bipolar disorder, who presents after taking an overdose with suicidal intent. She is minimizing it now, but she clearly has been having erratic worsening mood symptoms and behavior and has a past history of suicide attempts. I think that at this point the safest thing is hospitalization., Especially given what sounds like a somewhat unstable and stressful home environment.   TREATMENT PLAN: I will admit her to psychiatry. Continue current Risperdal, Lamictal, and other medicines. Psychoeducation completed regarding treatment plan. The case discussed with Emergency Room doctor.   DIAGNOSIS PRINCIPAL AND PRIMARY:   AXIS I: Bipolar disorder type I, depressed.   SECONDARY DIAGNOSES:   AXIS I: Marijuana abuse.   AXIS II: Deferred.   AXIS III: Chronic low back pain.     ____________________________ Gonzella Lex, MD jtc:bu D: 11/26/2014 13:11:06 ET T: 11/26/2014 13:48:14 ET JOB#: [id number]  cc: Gonzella Lex, MD, <Dictator> Gonzella Lex MD ELECTRONICALLY SIGNED 12/25/2014 10:09

## 2014-12-20 NOTE — H&P (Signed)
PATIENT NAME:  [name], [name] MR#:  [id number] DATE OF BIRTH:  07/25/1966  DATE OF ADMISSION:  11/26/2014  HISTORY AND PHYSICAL AND DISCHARGE SUMMARY  REFERRING PHYSICIAN: Emergency Room MD.   ATTENDING PHYSICIAN: Cait Locust B. Bary Leriche, MD   IDENTIFYING DATA: [name] [name] is a 49 year old female with a history of depression.   CHIEF COMPLAINT: "I was mad."  HISTORY OF PRESENT ILLNESS: [name] [name] has a history of depression. She was diagnosed with bipolar-type illness in December 2015 and started on a combination of Lamictal and Risperdal. She is in the care of Dr. Candis Schatz at [facility] in [place]. She is very happy with the results of her treatment. She noticed that she is much more stable, mood is not labile, she is happier and better able to function. However, in the past few days she had several severe stressors. Her 61 year old son, who was diagnosed with bipolar and stayed 6 weeks at home with home schooling, went back to school; her 4-year-old daughter has strep throat; and her husband had a head CT scan done yesterday in search of a brain tumor. On the morning of admission she became upset. Her 54 year old son, who could not calm her called, her mother for help. When the mother arrived in the house, the women started arguing. At some point, the mother suggested that the patient "should kill herself," which the patient proceeded doing. She took a handful of clonazepam, went to the bathroom, and spit them up. The mother did not believe that the patient got rid of the pills, called EMS, and she was brought to the Emergency Room. According to the notes, she could have taken anything between 10 and 30 tablets 0.25 Klonopin, but probably ingested a very small part of it. She was not sedated on admission. She was admitted to [facility] for further treatment. She denies any symptoms of depression, anxiety, or psychosis. She states that she did a stupid thing when mad and  upset with her mother. She denies alcohol, illicit substance, or prescription pill abuse. There are no symptoms suggestive of bipolar mania.   PAST PSYCHIATRIC HISTORY: Diagnosed with depression a while ago. She was hospitalized once at [facility] before, in 2011. This was when the family was under duress; they lost their house, had to move in with relatives. She has been doing well ever since. She thinks that since December and the new medication regimen, she is doing really, really well. She never attempted suicide before.   FAMILY PSYCHIATRIC HISTORY: Her maternal grandmother committed suicide at the age of 56. She was a schizophrenic. She was afraid that the family would put her in a hospital. Her father was likely bipolar but never formally diagnosed. Her 47 year old son carries a diagnosis of bipolar.   PAST MEDICAL HISTORY: GERD.   ALLERGIES: CODEINE, NEOSPORIN, PERCOCET, LATEX, MERCURY.   MEDICATIONS ON ADMISSION: Lamictal 200 mg at bedtime, Risperdal 1 mg at bedtime, tramadol 50 mg every 6 hours as needed for pain, pantoprazole 40 mg daily, Xanax 0.25 mg 3 times daily as needed for anxiety.   SOCIAL HISTORY: She is married. She has 2 children, ages 108 and 33. She is a stay-at-home mom. There are health problems with many members of the family, and the patient has been under considerable stress lately. She does not know the results of head CT scan of her husband yet.  REVIEW OF SYSTEMS:  CONSTITUTIONAL: No fevers or chills. No weight changes.  EYES: No double  or blurred vision.  ENT: No hearing loss.  RESPIRATORY: No shortness of breath or cough.  CARDIOVASCULAR: No chest pain or orthopnea.  GASTROINTESTINAL: No abdominal pain, nausea, vomiting, or diarrhea.  GENITOURINARY: No incontinence or frequency.  ENDOCRINE: No heat or cold intolerance.  LYMPHATIC: No anemia or easy bruising.  INTEGUMENTARY: No acne or rash.  MUSCULOSKELETAL: No muscle or joint pain.   NEUROLOGIC: No tingling or weakness.  PSYCHIATRIC: See history of present illness for details.   PHYSICAL EXAMINATION:  VITAL SIGNS: Blood pressure 149/89, pulse 97, respirations 29, temperature 97.9.  GENERAL: This is a well-developed female in no acute distress.  HEENT: The pupils are equal, round, and reactive to light. Sclerae are anicteric.  LUNGS: Clear to auscultation. No dullness to percussion.  HEART: No murmurs HEART: Regular rhythm and rate. No members, rubs, or gallops.  ABDOMEN: Soft, nontender, nondistended.  MUSCULOSKELETAL: Normal muscle strength in all extremities. SKIN: No acne or rash.  LYMPHATIC: No cervical adenopathy.  NEUROLOGIC: Cranial nerves II through XII are intact.   LABORATORY DATA: Chemistries are within normal limits. Blood alcohol level is zero. LFTs within normal limits. Urine toxicology screen positive for cannabinoids. CBC within normal limits except for hemoglobin of 11.4, MCV 77. Urinalysis is not suggestive of urinary tract infection. Serum acetaminophen and salicylate are low.   MENTAL STATUS EXAMINATION: The patient is alert and oriented to person, place, time, and situation. She is pleasant, polite, and cooperative. She maintains good eye contact. She is well groomed and casually dressed. Her speech is of normal rhythm, rate, and volume. Mood is fine, with full affect. Thought process is logical and goal oriented. Thought content: She denies suicidal or homicidal ideation. There are no delusions or paranoia. There are no auditory or visual hallucinations. Her cognition is grossly intact. Registration, recall, short- and long-term memory are intact. She is of average intelligence and fund of knowledge. Her insight and judgment are fair. Suicide risk assessment: This is a patient with a history of likely bipolar depression, stable on medication, who overdosed on her benzodiazepines in the context of severe social stressors and family contact. She did it in  front of her mother, who then called EMS, and the patient regrets her action. She is not suicidal. She is a loving mother. She wants to take care of her family.   DIAGNOSIS:  AXIS I: Bipolar I disorder, depressed. Cannabis use disorder, severe. GERD, chronic pain.   PLAN: The patient was briefly admitted to [facility] unit for safety, stabilization, and medication management.  1.  Suicidal ideation: This has resolved. The patient is able to contract for safety. 2.   Mood: We continued Lamictal 200 mg at bedtime along with Risperdal 1 mg at bedtime for mood stabilization.  3.  Anxiety: The patient will continue on Xanax 0.25 as prescribed by Dr. Candis Schatz. No prescriptions were given.  4.  Gastroesophageal reflux disease: We continued pantoprazole. 5.  Chronic pain: She was given tramadol in the hospital. No prescriptions given. She will follow up with her primary provider for that. There were no complaints of pain.  6.  Cannabis abuse: The patient minimizes her problem and declines substance abuse treatment.  DISPOSITION: She was discharged to home with her family. She will follow up with Dr. Candis Schatz at [facility] in the next week.     ____________________________ Wardell Honour. Bary Leriche, MD jbp:ST D: 11/27/2014 21:06:00 ET T: 11/27/2014 21:41:59 ET JOB#: [id number]  cc: Aracelie Addis B. Bary Leriche, MD, <  Dictator> Clovis Fredrickson MD ELECTRONICALLY SIGNED 11/30/2014 20:15

## 2014-12-20 NOTE — H&P (Signed)
PATIENT NAME:  [name] [name], [name] [name] MR#:  [id number] DATE OF BIRTH:  07-Dec-1965  DATE OF ADMISSION:  11/26/2014  ADDENDUM-THIS IS A CONTINUATION   DATE OF ASSESSMENT: 11/27/2014  DATE OF DISCHARGE: 11/27/2014  REFERRING PHYSICIAN: Emergency Room MD.    ATTENDING PHYSICIAN: Levone Otten B. Nivan Melendrez, MD  PHYSICAL EXAMINATION: HEART: Regular rhythm and rate. No members, rubs, or gallops.  ABDOMEN: Soft, nontender, nondistended.  MUSCULOSKELETAL: Normal muscle strength in all extremities. SKIN: No acne or rash.  LYMPHATIC: No cervical adenopathy.  NEUROLOGIC: Cranial nerves II through XII are intact.   LABORATORY DATA: Chemistries are within normal limits. Blood alcohol level is zero. LFTs within normal limits. Urine toxicology screen positive for cannabinoids. CBC within normal limits except for hemoglobin of 11.4, MCV 77. Urinalysis is not suggestive of urinary tract infection. Serum acetaminophen and salicylate are low.   MENTAL STATUS EXAMINATION: The patient is alert and oriented to person, place, time, and situation. She is pleasant, polite, and cooperative. She maintains good eye contact. She is well groomed and casually dressed. Her speech is of normal rhythm, rate, and volume. Mood is fine, with full affect. Thought process is logical and goal oriented. Thought content: She denies suicidal or homicidal ideation. There are no delusions or paranoia. There are no auditory or visual hallucinations. Her cognition is grossly intact. Registration, recall, short- and long-term memory are intact. She is of average intelligence and fund of knowledge. Her insight and judgment are fair. Suicide risk assessment: This is a patient with a history of likely bipolar depression, stable on medication, who overdosed on her benzodiazepines in the context of severe social stressors and family contact. She did it in front of her mother, who then called EMS, and the patient regrets her action. She is not suicidal. She  is a loving mother. She wants to take care of her family.   DIAGNOSIS:  AXIS I: Bipolar I disorder, depressed. Cannabis use disorder, severe. GERD, chronic pain.   PLAN: The patient was briefly admitted to [facility] unit for safety, stabilization, and medication management.  1.  Suicidal ideation: This has resolved. The patient is able to contract for safety. 2.   Mood: We continued Lamictal 200 mg at bedtime along with Risperdal 1 mg at bedtime for mood stabilization.  3.  Anxiety: The patient will continue on Xanax 0.25 as prescribed by Dr. Candis Schatz. No prescriptions were given.  4.  Gastroesophageal reflux disease: We continued pantoprazole. 5.  Chronic pain: She was given tramadol in the hospital. No prescriptions given. She will follow up with her primary provider for that. There were no complaints of pain.  6.  Cannabis abuse: The patient minimizes her problem and declines substance abuse treatment.  DISPOSITION: She was discharged to home with her family. She will follow up with Dr. Candis Schatz at [facility] in the next week.   ____________________________ Wardell Honour. Bary Leriche, MD jbp:ST D: 11/27/2014 21:15:32 ET T: 11/27/2014 22:03:38 ET JOB#: [id number]  cc: Sherard Sutch B. Bary Leriche, MD, <Dictator> Clovis Fredrickson MD ELECTRONICALLY SIGNED 11/30/2014 20:15

## 2015-01-15 ENCOUNTER — Ambulatory Visit: Payer: Self-pay | Admitting: Nurse Practitioner

## 2015-03-01 ENCOUNTER — Other Ambulatory Visit: Payer: Self-pay | Admitting: Orthopedic Surgery

## 2015-03-01 DIAGNOSIS — M5412 Radiculopathy, cervical region: Secondary | ICD-10-CM

## 2015-03-05 ENCOUNTER — Ambulatory Visit: Payer: BLUE CROSS/BLUE SHIELD

## 2015-03-05 ENCOUNTER — Ambulatory Visit
Admission: RE | Admit: 2015-03-05 | Discharge: 2015-03-05 | Disposition: A | Discharge status: Home / Self Care | Payer: BLUE CROSS/BLUE SHIELD | Source: Ambulatory Visit | Attending: Orthopedic Surgery | Admitting: Orthopedic Surgery

## 2015-03-05 DIAGNOSIS — M5412 Radiculopathy, cervical region: Secondary | ICD-10-CM

## 2015-03-12 ENCOUNTER — Ambulatory Visit
Admission: RE | Admit: 2015-03-12 | Discharge: 2015-03-12 | Disposition: A | Discharge status: Home / Self Care | Payer: BLUE CROSS/BLUE SHIELD | Source: Ambulatory Visit | Attending: Orthopedic Surgery | Admitting: Orthopedic Surgery

## 2015-03-12 DIAGNOSIS — M5022 Other cervical disc displacement, mid-cervical region: Secondary | ICD-10-CM | POA: Insufficient documentation

## 2015-03-12 DIAGNOSIS — M5412 Radiculopathy, cervical region: Secondary | ICD-10-CM | POA: Diagnosis present

## 2015-04-22 DIAGNOSIS — M5412 Radiculopathy, cervical region: Secondary | ICD-10-CM | POA: Insufficient documentation

## 2015-06-04 ENCOUNTER — Telehealth: Payer: Self-pay | Admitting: Cardiovascular Disease

## 2015-06-04 NOTE — Telephone Encounter (Signed)
3 attempts made to schedule over due fu from recall list.  LMOV to call office to schedule.   Deleting recall.

## 2015-06-11 ENCOUNTER — Ambulatory Visit: Payer: BLUE CROSS/BLUE SHIELD | Admitting: Nurse Practitioner

## 2015-06-26 ENCOUNTER — Encounter: Payer: Self-pay | Admitting: Emergency Medicine

## 2015-06-26 ENCOUNTER — Emergency Department: Payer: BLUE CROSS/BLUE SHIELD

## 2015-06-26 ENCOUNTER — Emergency Department
Admission: EM | Admit: 2015-06-26 | Discharge: 2015-06-26 | Disposition: A | Discharge status: Home / Self Care | Payer: BLUE CROSS/BLUE SHIELD | Attending: Emergency Medicine | Admitting: Emergency Medicine

## 2015-06-26 DIAGNOSIS — R0789 Other chest pain: Secondary | ICD-10-CM | POA: Diagnosis not present

## 2015-06-26 DIAGNOSIS — R109 Unspecified abdominal pain: Secondary | ICD-10-CM | POA: Insufficient documentation

## 2015-06-26 DIAGNOSIS — Z87891 Personal history of nicotine dependence: Secondary | ICD-10-CM | POA: Insufficient documentation

## 2015-06-26 DIAGNOSIS — Z79899 Other long term (current) drug therapy: Secondary | ICD-10-CM | POA: Insufficient documentation

## 2015-06-26 DIAGNOSIS — Z91040 Latex allergy status: Secondary | ICD-10-CM | POA: Insufficient documentation

## 2015-06-26 DIAGNOSIS — R112 Nausea with vomiting, unspecified: Secondary | ICD-10-CM | POA: Insufficient documentation

## 2015-06-26 DIAGNOSIS — M79622 Pain in left upper arm: Secondary | ICD-10-CM | POA: Diagnosis present

## 2015-06-26 LAB — CBC WITH DIFFERENTIAL/PLATELET
Basophils Absolute: 0 10*3/uL (ref 0–0.1)
Basophils Relative: 1 %
Eosinophils Absolute: 0.5 10*3/uL (ref 0–0.7)
Eosinophils Relative: 7 %
HEMATOCRIT: 37.2 % (ref 35.0–47.0)
Hemoglobin: 11.9 g/dL — ABNORMAL LOW (ref 12.0–16.0)
Lymphocytes Relative: 17 %
Lymphs Abs: 1.2 10*3/uL (ref 1.0–3.6)
MCH: 25.4 pg — AB (ref 26.0–34.0)
MCHC: 31.8 g/dL — AB (ref 32.0–36.0)
MCV: 79.9 fL — AB (ref 80.0–100.0)
MONO ABS: 0.6 10*3/uL (ref 0.2–0.9)
MONOS PCT: 9 %
NEUTROS ABS: 4.8 10*3/uL (ref 1.4–6.5)
Neutrophils Relative %: 66 %
Platelets: 364 10*3/uL (ref 150–440)
RBC: 4.66 MIL/uL (ref 3.80–5.20)
RDW: 15.4 % — ABNORMAL HIGH (ref 11.5–14.5)
WBC: 7.2 10*3/uL (ref 3.6–11.0)

## 2015-06-26 LAB — BASIC METABOLIC PANEL
Anion gap: 6 (ref 5–15)
BUN: 9 mg/dL (ref 6–20)
CALCIUM: 10 mg/dL (ref 8.9–10.3)
CO2: 28 mmol/L (ref 22–32)
CREATININE: 0.84 mg/dL (ref 0.44–1.00)
Chloride: 99 mmol/L — ABNORMAL LOW (ref 101–111)
GFR calc Af Amer: >60 mL/min (ref >60)
GFR calc non Af Amer: >60 mL/min (ref >60)
GLUCOSE: 88 mg/dL (ref 65–99)
Potassium: 4.3 mmol/L (ref 3.5–5.1)
Sodium: 133 mmol/L — ABNORMAL LOW (ref 135–145)

## 2015-06-26 LAB — FIBRIN DERIVATIVES D-DIMER (ARMC ONLY): Fibrin derivatives D-dimer (ARMC): 373 (ref 0–499)

## 2015-06-26 MED ORDER — LORAZEPAM 1 MG PO TABS: 1.0000 mg | ORAL_TABLET | Freq: Three times a day (TID) | ORAL | Status: AC | PRN

## 2015-06-26 MED ORDER — LIDOCAINE 5 % EX PTCH: 1.0000 | MEDICATED_PATCH | Freq: Two times a day (BID) | CUTANEOUS | Status: AC

## 2015-06-26 MED ORDER — KETOROLAC TROMETHAMINE 30 MG/ML IJ SOLN
30.0000 mg | Freq: Once | INTRAMUSCULAR | Status: AC
  Administered 2015-06-26: 30 mg via INTRAVENOUS
  Filled 2015-06-26: qty 1

## 2015-06-26 MED ORDER — HYDROCODONE-ACETAMINOPHEN 5-325 MG PO TABS: 1.0000 | ORAL_TABLET | Freq: Four times a day (QID) | ORAL | Status: DC | PRN

## 2015-06-26 MED ORDER — LIDOCAINE 5 % EX PTCH
1.0000 | MEDICATED_PATCH | CUTANEOUS | Status: DC
  Administered 2015-06-26: 1 via TRANSDERMAL
  Filled 2015-06-26: qty 1

## 2015-06-26 NOTE — Discharge Instructions (Signed)
Chest Wall Pain Chest wall pain is pain in or around the bones and muscles of your chest. Sometimes, an injury causes this pain. Sometimes, the cause may not be known. This pain may take several weeks or longer to get better. HOME CARE INSTRUCTIONS  Pay attention to any changes in your symptoms. Take these actions to help with your pain:   Rest as told by your health care provider.   Avoid activities that cause pain. These include any activities that use your chest muscles or your abdominal and side muscles to lift heavy items.   If directed, apply ice to the painful area:  Put ice in a plastic bag.  Place a towel between your skin and the bag.  Leave the ice on for 20 minutes, 2-3 times per day.  Take over-the-counter and prescription medicines only as told by your health care provider.  Do not use tobacco products, including cigarettes, chewing tobacco, and e-cigarettes. If you need help quitting, ask your health care provider.  Keep all follow-up visits as told by your health care provider. This is important. SEEK MEDICAL CARE IF:  You have a fever.  Your chest pain becomes worse.  You have new symptoms. SEEK IMMEDIATE MEDICAL CARE IF:  You have nausea or vomiting.  You feel sweaty or light-headed.  You have a cough with phlegm (sputum) or you cough up blood.  You develop shortness of breath.   This information is not intended to replace advice given to you by your health care provider. Make sure you discuss any questions you have with your health care provider.   Document Released: 08/07/2005 Document Revised: 04/28/2015 Document Reviewed: 11/02/2014 Elsevier Interactive Patient Education [employer].  Please return immediately if condition worsens. Please contact her primary physician or the physician you were given for referral. If you have any specialist physicians involved in her treatment and plan please also contact them. Thank you for using [facility] emergency Department. Return especially for fever, shortness of breath rash, or any new concerns such as weakness in either upper or lower extremities

## 2015-06-26 NOTE — ED Notes (Signed)
Pt c/o left sided abdominal pain that radiates to her back. Sts it is very painful to move her left arm. Pt sts this started earlier in the week and was given a shot for pain on Wednesday. Pt stated it helped at first but wore off and pain started worsening this morning. Pt sts she vomited x1 on Wednesday. Pt denies n/v/d currently. Pt sts she feels like someone stabbed her in the back.

## 2015-06-26 NOTE — ED Provider Notes (Signed)
Time Seen: Approximately 1136 I have reviewed the triage notes  Chief Complaint: Arm Pain   History of Present Illness: [name] [name] is a 49 y.o. female *who describes pain underneath the left shoulder blade and radiates slightly up to the left axillary area. She states the pain is worse with movement of her left arm but denies much in way of pain directly in the arm. She denies any chest pain or shortness of breath, nausea or vomiting. She states she's had this pain for over a week and was previously seen at the [facility]. She has had a history of a pinched nerve with a nerve block applied. She denies any fresh trauma, right-sided pain, worsening pain with movement of her right arm. She is not aware of any rashes over this area. She was concerned could she has a history of atrial fibrillation and that somehow it may be related. She denies any heart palpitations or near syncopal episode. She states she had some nausea and vomited 1 this past Wednesday. No persistent vomiting and describes the pain is very sharp and constant   Past Medical History  Diagnosis Date  . GERD (gastroesophageal reflux disease)   . A-fib ([place])   . Arthritis   . Depression   . Chicken pox   . Migraines     Patient Active Problem List   Diagnosis Date Noted  . Encounter to establish care 12/10/2014  . Uterine fibroid 12/10/2014  . Bipolar I disorder, most recent episode depressed ([place]) 12/10/2014  . GERD (gastroesophageal reflux disease) 12/10/2014  . Back pain 12/10/2014  . Migraines 12/10/2014  . Fatigue 05/30/2013  . A-fib ([place]) 04/10/2013  . Inguinal cyst 02/10/2013    Past Surgical History  Procedure Laterality Date  . Appendectomy  5/11  . Cesarean section  10/98,9/12  . Tonsillectomy  03/2008    Past Surgical History  Procedure Laterality Date  . Appendectomy  5/11  . Cesarean section  10/98,9/12  . Tonsillectomy  03/2008    Current Outpatient Rx  Name  Route  Sig  Dispense   Refill  . ALPRAZolam (XANAX) 0.5 MG tablet   Oral   Take 0.5 mg by mouth at bedtime as needed.          . flecainide (TAMBOCOR) 50 MG tablet   Oral   Take 1 tablet (50 mg total) by mouth 2 (two) times daily.   60 tablet   11   . HYDROcodone-acetaminophen (NORCO) 5-325 MG tablet   Oral   Take 1 tablet by mouth every 6 (six) hours as needed for moderate pain.   20 tablet   0   . ibuprofen (ADVIL,MOTRIN) 200 MG tablet   Oral   Take 200 mg by mouth every 8 (eight) hours as needed.          . lamoTRIgine (LAMICTAL) 100 MG tablet   Oral   Take 100 mg by mouth daily.         Marland Kitchen lidocaine (LIDODERM) 5 %   Transdermal   Place 1 patch onto the skin every 12 (twelve) hours. Remove & Discard patch within 12 hours or as directed by MD   10 patch   0   . LORazepam (ATIVAN) 1 MG tablet   Oral   Take 1 tablet (1 mg total) by mouth every 8 (eight) hours as needed for anxiety.   30 tablet   0   . metoprolol tartrate (LOPRESSOR) 25 MG tablet   Oral  Take 1 tablet (25 mg total) by mouth 2 (two) times daily as needed. Patient not taking: Reported on 12/10/2014   60 tablet   11   . omeprazole (PRILOSEC) 20 MG capsule   Oral   Take 20 mg by mouth daily.         . sertraline (ZOLOFT) 100 MG tablet   Oral   Take 100 mg by mouth daily.           Allergies:  Augmentin; Codeine; Latex; Neomycin; and Percocet  Family History: Family History  Problem Relation Age of Onset  . Diabetes Mother   . Alcohol abuse Father   . Arthritis Father   . Heart disease Father   . Arthritis Sister   . Alcohol abuse Maternal Aunt   . Alcohol abuse Maternal Uncle   . Arthritis Maternal Grandmother   . Schizophrenia Maternal Grandmother   . Alcohol abuse Paternal Grandfather     Social History: Social History  Substance Use Topics  . Smoking status: Former Smoker -- 1.00 packs/day for 10 years    Types: Cigarettes    Quit date: 08/22/1991  . Smokeless tobacco: Never Used  .  Alcohol Use: 0.0 oz/week    0 Standard drinks or equivalent per week     Comment: twice a year      Review of Systems:   10 point review of systems was performed and was otherwise negative:  Constitutional: No fever Eyes: No visual disturbances ENT: No sore throat, ear pain Cardiac: No substernal chest pain Respiratory: No shortness of breath, wheezing, or stridor. She does have some pleuritic component Abdomen: No abdominal pain, no vomiting, No diarrhea Endocrine: No weight loss, No night sweats Extremities: No peripheral edema, cyanosis Skin: No rashes, easy bruising Neurologic: No focal weakness, trouble with speech or swollowing Urologic: No dysuria, Hematuria, or urinary frequency   Physical Exam:  ED Triage Vitals  Enc Vitals Group     BP 06/26/15 1110 125/78 mmHg     Pulse Rate 06/26/15 1110 87     Resp 06/26/15 1110 20     Temp 06/26/15 1110 98.1 F (36.7 C)     Temp Source 06/26/15 1110 Oral     SpO2 06/26/15 1110 99 %     Weight 06/26/15 1110 176 lb (79.833 kg)     Height 06/26/15 1110 5\' 5"  (1.651 m)     Head Cir --      Peak Flow --      Pain Score 06/26/15 1110 10     Pain Loc --      Pain Edu? --      Excl. in [place]? --     General: Awake , Alert , and Oriented times 3; GCS 15 Head: Normal cephalic , atraumatic Eyes: Pupils equal , round, reactive to light Nose/Throat: No nasal drainage, patent upper airway without erythema or exudate.  Neck: Supple, Full range of motion, No anterior adenopathy or palpable thyroid masses Lungs: Clear to ascultation without wheezes , rhonchi, or rales Heart: Regular rate, regular rhythm without murmurs , gallops , or rubs Abdomen: Soft, non tender without rebound, guarding , or rigidity; bowel sounds positive and symmetric in all 4 quadrants. No organomegaly .        Extremities: 2 plus symmetric pulses. No edema, clubbing or cyanosis Neurologic: normal ambulation, Motor symmetric without deficits, sensory intact Skin:  warm, dry, no rashes Patient has reproducible pain with palpation inferior to the left shoulder blade region  without any crepitus or step-off noted there's no rashes. Palpation here does reproduce the patient's discomfort.  Labs:   All laboratory work was reviewed including any pertinent negatives or positives listed below:  Labs Reviewed  CBC WITH DIFFERENTIAL/PLATELET - Abnormal; Notable for the following:    Hemoglobin 11.9 (*)    MCV 79.9 (*)    MCH 25.4 (*)    MCHC 31.8 (*)    RDW 15.4 (*)    All other components within normal limits  BASIC METABOLIC PANEL - Abnormal; Notable for the following:    Sodium 133 (*)    Chloride 99 (*)    All other components within normal limits  FIBRIN DERIVATIVES D-DIMER ([facility] ONLY)   Review laboratory work showed no significant findings EKG: ED ECG REPORT I, Daymon Larsen, the attending physician, personally viewed and interpreted this ECG.  Date: 06/26/2015 EKG Time: 1116 Rate: 89 Rhythm: normal sinus rhythm QRS Axis: normal Intervals: normal ST/T Wave abnormalities: normal Conduction Disutrbances: none Narrative Interpretation: unremarkable Normal EKG  Radiology:     EXAM: CHEST - 2 VIEW  COMPARISON: 11/25/2014  FINDINGS: Cardiomediastinal silhouette projects within normal limits in size and contour. No confluent airspace disease, pneumothorax, or pleural effusion.  No displaced fracture.  Unremarkable appearance of the upper abdomen.  IMPRESSION: No radiographic evidence of acute cardiopulmonary disease.  Signed,  Dulcy Fanny. Earleen Newport, DO  [facility]  [facility]  I personally reviewed the radiologic studies    ED Course: * Patient's stay here was uneventful and she had some improvement of her pain with Toradol, lidocaine patch and some Ativan here in emergency department. She states the pain is still present though felt it was unlikely to be from a  life-threatening etiology at this time. Given the patient's reproducible nature to her pain is most likely is musculoskeletal in nature. Patient will be prescribed pain medication on an outpatient basis and advised drink plenty of fluids and return here if she develops any new symptoms. We also talked about that's shingles early on to present with pain prior to having any form of rash. There is no evidence that this is cardiovascular in nature.  Assessment:  Left sided musculoskeletal flank pain     Plan: * Patient was advised continue with outpatient follow up as recommended above. Patient was advised to return immediately if condition worsens. Patient was advised to follow up with her primary care physician or other specialized physicians involved and in their current assessment. She was prescribed lidocaine patch along with Norco.            Daymon Larsen, MD 06/26/15 [phone]

## 2015-06-26 NOTE — ED Notes (Signed)
Pt to ed with c/o left arm/axillary pain.  Pt states she carried her daughter around on Monday and Tuesday she noted the pain in left arm,  Now with decreased ROM in left arm.

## 2015-07-29 DIAGNOSIS — M5020 Other cervical disc displacement, unspecified cervical region: Secondary | ICD-10-CM | POA: Insufficient documentation

## 2016-04-02 ENCOUNTER — Emergency Department: Payer: BLUE CROSS/BLUE SHIELD

## 2016-04-02 ENCOUNTER — Encounter: Payer: Self-pay | Admitting: Emergency Medicine

## 2016-04-02 ENCOUNTER — Emergency Department
Admission: EM | Admit: 2016-04-02 | Discharge: 2016-04-02 | Disposition: A | Discharge status: Home / Self Care | Payer: BLUE CROSS/BLUE SHIELD | Attending: Emergency Medicine | Admitting: Emergency Medicine

## 2016-04-02 DIAGNOSIS — Y939 Activity, unspecified: Secondary | ICD-10-CM | POA: Insufficient documentation

## 2016-04-02 DIAGNOSIS — Y999 Unspecified external cause status: Secondary | ICD-10-CM | POA: Insufficient documentation

## 2016-04-02 DIAGNOSIS — Z87891 Personal history of nicotine dependence: Secondary | ICD-10-CM | POA: Diagnosis not present

## 2016-04-02 DIAGNOSIS — Y92015 Private garage of single-family (private) house as the place of occurrence of the external cause: Secondary | ICD-10-CM | POA: Insufficient documentation

## 2016-04-02 DIAGNOSIS — I4891 Unspecified atrial fibrillation: Secondary | ICD-10-CM | POA: Insufficient documentation

## 2016-04-02 DIAGNOSIS — S0083XA Contusion of other part of head, initial encounter: Secondary | ICD-10-CM

## 2016-04-02 DIAGNOSIS — S022XXA Fracture of nasal bones, initial encounter for closed fracture: Secondary | ICD-10-CM | POA: Insufficient documentation

## 2016-04-02 DIAGNOSIS — S0992XA Unspecified injury of nose, initial encounter: Secondary | ICD-10-CM | POA: Diagnosis present

## 2016-04-02 MED ORDER — HYDROCODONE-ACETAMINOPHEN 5-325 MG PO TABS: 1.0000 | ORAL_TABLET | Freq: Four times a day (QID) | ORAL | 0 refills | Status: DC | PRN

## 2016-04-02 MED ORDER — ONDANSETRON 4 MG PO TBDP
4.0000 mg | ORAL_TABLET | Freq: Once | ORAL | Status: AC
  Administered 2016-04-02: 4 mg via ORAL

## 2016-04-02 MED ORDER — MORPHINE SULFATE (PF) 4 MG/ML IV SOLN
4.0000 mg | Freq: Once | INTRAVENOUS | Status: AC
  Administered 2016-04-02: 4 mg via INTRAMUSCULAR

## 2016-04-02 MED ORDER — IBUPROFEN 600 MG PO TABS: 600.0000 mg | ORAL_TABLET | Freq: Three times a day (TID) | ORAL | 0 refills | Status: DC | PRN

## 2016-04-02 NOTE — ED Notes (Signed)
RN called patient on her cell phone. Patient stated that she "got tired of waiting because I have been there 4 hours and that I have to go to the magistrates office and turn myself in because I am being charged with simple assault and I did not want them coming to the hospital to come get me."  Patient was informed that there were prescriptions and discharge papers for her. Patient asked what the prescriptions were for, I informed her 1 RX was for ibuprofen 600 mg and the other was for Norco. Patient stated that she had ibuprofen at home and that she could not take [place]. Patient informed to follow up with her PCP and family crisis center in 1 week per Dr. Reita Cliche instructions. Patient verbalized understanding.
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
## 2016-04-02 NOTE — ED Notes (Signed)
Discharge papers and prescriptions put in envelope and left at triage.

## 2016-04-02 NOTE — ED Notes (Signed)
RN went in room to discharge patient, patient was not in room
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
## 2016-04-02 NOTE — Discharge Instructions (Signed)
Return to emergency room for any worsening condition including depression or thoughts of wanting to hurt herself or others, worsening pain, uncontrolled bleeding, chest pain or trouble breathing, abdominal pain, or any other symptoms concerning to you.
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
## 2016-04-30 IMAGING — CR DG CHEST 1V PORT
1 series · 1 of 1 positions shown · non-contrast
Comparison: 12/29/2013

CLINICAL DATA: Overdose on Xanax

EXAM:
PORTABLE CHEST - 1 VIEW

[ap]
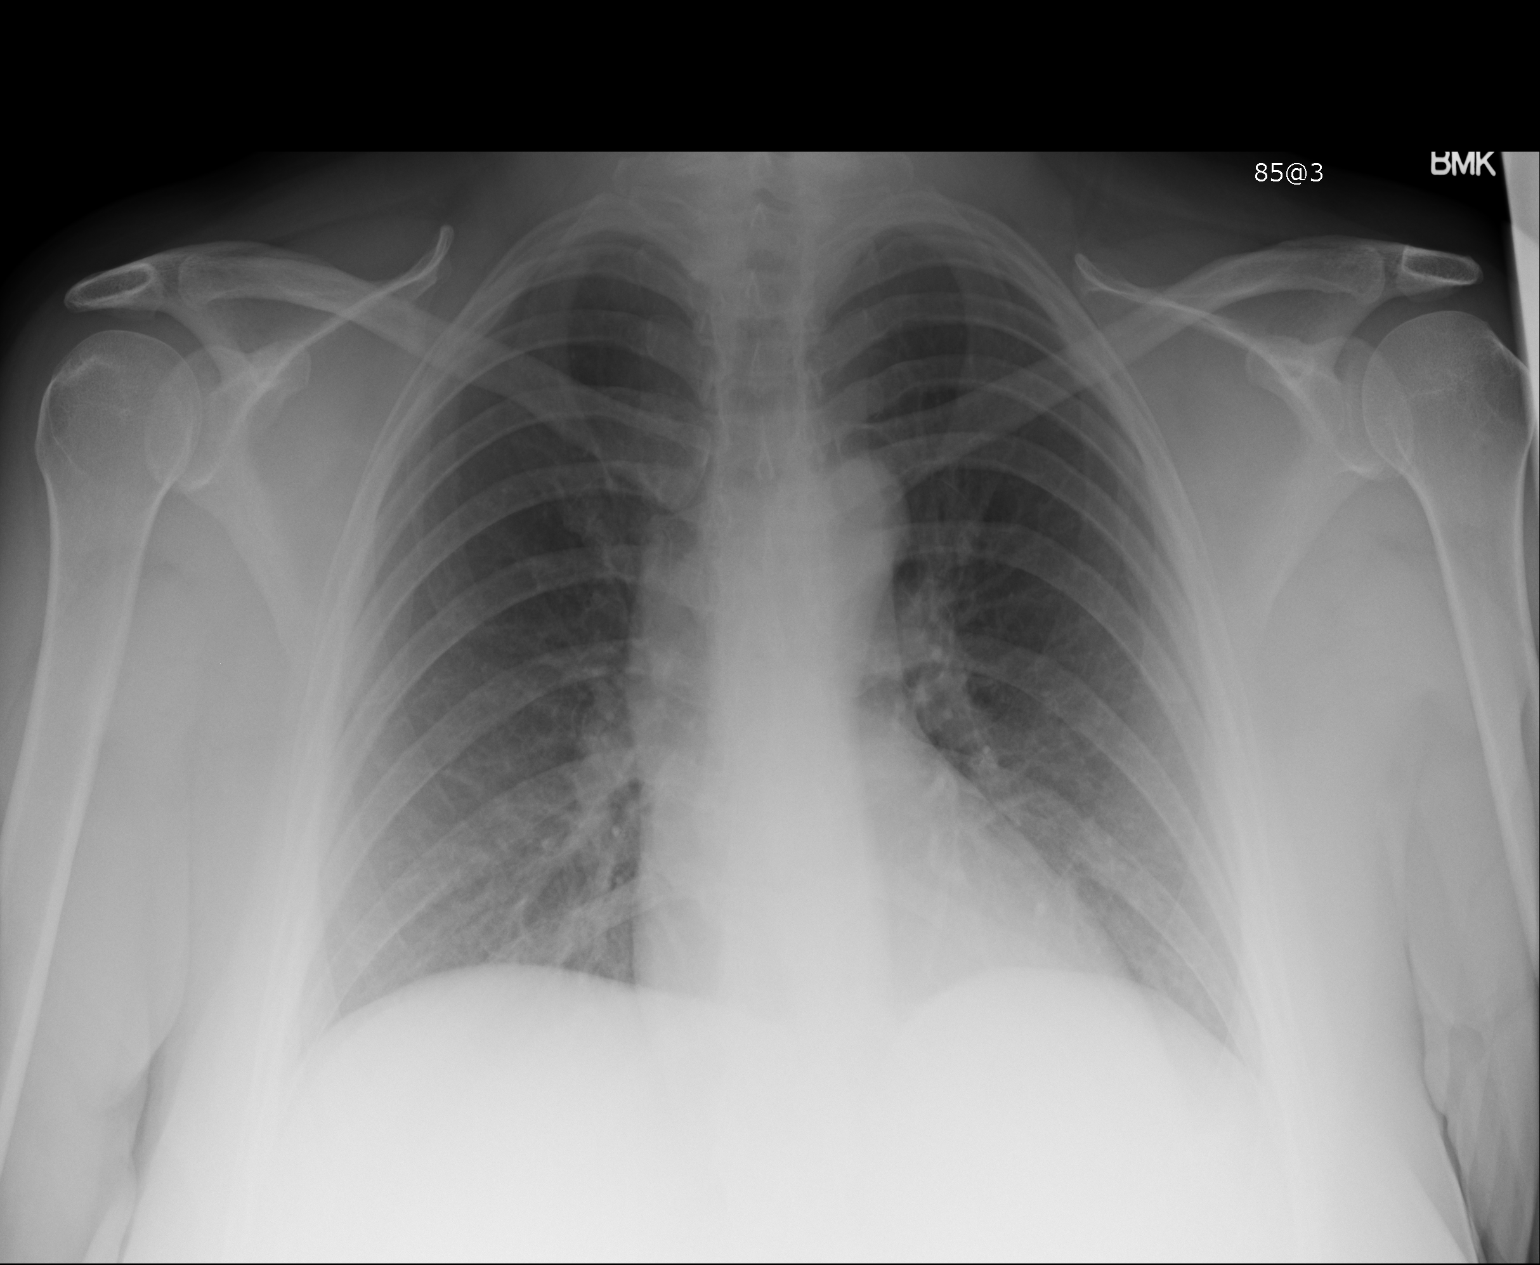

[1 of 1 positions shown; findings below may reference images not displayed]

FINDINGS: Cardiomediastinal silhouette is stable. No acute infiltrate or
pleural effusion. No pulmonary edema. Bony thorax is unremarkable.
IMPRESSION: No active disease.

## 2016-11-01 ENCOUNTER — Telehealth: Payer: Self-pay | Admitting: Cardiovascular Disease

## 2016-11-01 ENCOUNTER — Encounter: Payer: Self-pay | Admitting: Emergency Medicine

## 2016-11-01 ENCOUNTER — Emergency Department: Payer: BLUE CROSS/BLUE SHIELD

## 2016-11-01 ENCOUNTER — Emergency Department
Admission: EM | Admit: 2016-11-01 | Discharge: 2016-11-01 | Disposition: A | Discharge status: Home / Self Care | Payer: BLUE CROSS/BLUE SHIELD | Attending: Emergency Medicine | Admitting: Emergency Medicine

## 2016-11-01 DIAGNOSIS — R600 Localized edema: Secondary | ICD-10-CM | POA: Insufficient documentation

## 2016-11-01 DIAGNOSIS — Z87891 Personal history of nicotine dependence: Secondary | ICD-10-CM | POA: Insufficient documentation

## 2016-11-01 DIAGNOSIS — R51 Headache: Secondary | ICD-10-CM | POA: Diagnosis not present

## 2016-11-01 DIAGNOSIS — M7989 Other specified soft tissue disorders: Secondary | ICD-10-CM | POA: Diagnosis present

## 2016-11-01 DIAGNOSIS — R079 Chest pain, unspecified: Secondary | ICD-10-CM | POA: Diagnosis not present

## 2016-11-01 DIAGNOSIS — Z79899 Other long term (current) drug therapy: Secondary | ICD-10-CM | POA: Diagnosis not present

## 2016-11-01 LAB — BASIC METABOLIC PANEL
ANION GAP: 7 (ref 5–15)
BUN: 10 mg/dL (ref 6–20)
CHLORIDE: 106 mmol/L (ref 101–111)
CO2: 26 mmol/L (ref 22–32)
Calcium: 10 mg/dL (ref 8.9–10.3)
Creatinine, Ser: 0.71 mg/dL (ref 0.44–1.00)
GFR calc Af Amer: >60 mL/min (ref >60)
GFR calc non Af Amer: >60 mL/min (ref >60)
Glucose, Bld: 105 mg/dL — ABNORMAL HIGH (ref 65–99)
POTASSIUM: 3.9 mmol/L (ref 3.5–5.1)
Sodium: 139 mmol/L (ref 135–145)

## 2016-11-01 LAB — CBC
HEMATOCRIT: 32.7 % — AB (ref 35.0–47.0)
Hemoglobin: 10.7 g/dL — ABNORMAL LOW (ref 12.0–16.0)
MCH: 24.8 pg — AB (ref 26.0–34.0)
MCHC: 32.8 g/dL (ref 32.0–36.0)
MCV: 75.7 fL — AB (ref 80.0–100.0)
Platelets: 439 10*3/uL (ref 150–440)
RBC: 4.32 MIL/uL (ref 3.80–5.20)
RDW: 16 % — ABNORMAL HIGH (ref 11.5–14.5)
WBC: 6.5 10*3/uL (ref 3.6–11.0)

## 2016-11-01 LAB — TROPONIN I: Troponin I: <0.03 ng/mL (ref <0.03)

## 2016-11-01 LAB — POCT PREGNANCY, URINE: Preg Test, Ur: NEGATIVE

## 2016-11-01 NOTE — ED Provider Notes (Signed)
[facility] Emergency Department Provider Note   ____________________________________________   First MD Initiated Contact with Patient 11/01/16 1616     (approximate)  I have reviewed the triage vital signs and the nursing notes.   HISTORY  Chief Complaint Headache; Leg Swelling; and Discomfort in Chest  HPI TEAH VOTAW is a 51 y.o. female the previous history of occasional atrial fibrillation, anemia, and migraines  Patient tells me she is primarily here for evaluation of swelling in her ankles, she is notices over the last 2 weeks, is worse after working, and relieved by resting at night putting her legs up. She denies ever having problems with swelling in her legs in the past, had presently reports a mild but similar swelling in both legs was worse last night.  No trouble breathing. No fevers or chills. She had an episode of what she describes as severe chest pain about 2 weeks ago, it lasted for less than a day, she did not seek medical care, and all of her symptoms resolved. She thinks it was due to her "hiatal hernia", she reports she's had a hiatal hernia pain in the past and has been somewhat similar.  No nausea or vomiting. No abdominal pain. No trouble breathing. Denies pregnancy   Past Medical History:  Diagnosis Date  . A-fib ([place])   . Arthritis   . Chicken pox   . Depression   . GERD (gastroesophageal reflux disease)   . Migraines     Patient Active Problem List   Diagnosis Date Noted  . Encounter to establish care 12/10/2014  . Uterine fibroid 12/10/2014  . Bipolar I disorder, most recent episode depressed ([place]) 12/10/2014  . GERD (gastroesophageal reflux disease) 12/10/2014  . Back pain 12/10/2014  . Migraines 12/10/2014  . Fatigue 05/30/2013  . A-fib ([place]) 04/10/2013  . Inguinal cyst 02/10/2013    Past Surgical History:  Procedure Laterality Date  . APPENDECTOMY  5/11  . C-spine surgery    . CESAREAN SECTION   10/98,9/12  . TONSILLECTOMY  03/2008    Prior to Admission medications   Medication Sig Start Date End Date Taking? Authorizing Provider  ALPRAZolam Duanne Moron) 0.5 MG tablet Take 0.5 mg by mouth at bedtime as needed.     Historical Provider, MD  flecainide (TAMBOCOR) 50 MG tablet Take 1 tablet (50 mg total) by mouth 2 (two) times daily. 06/17/14   Minna Merritts, MD  HYDROcodone-acetaminophen (NORCO/VICODIN) 5-325 MG tablet Take 1-2 tablets by mouth every 6 (six) hours as needed for moderate pain. 04/02/16   Lisa Roca, MD  ibuprofen (ADVIL,MOTRIN) 600 MG tablet Take 1 tablet (600 mg total) by mouth every 8 (eight) hours as needed. 04/02/16   Lisa Roca, MD  lamoTRIgine (LAMICTAL) 100 MG tablet Take 100 mg by mouth daily.    Historical Provider, MD  metoprolol tartrate (LOPRESSOR) 25 MG tablet Take 1 tablet (25 mg total) by mouth 2 (two) times daily as needed. Patient not taking: Reported on 12/10/2014 06/17/14   Minna Merritts, MD  omeprazole (PRILOSEC) 20 MG capsule Take 20 mg by mouth daily.    Historical Provider, MD  sertraline (ZOLOFT) 100 MG tablet Take 100 mg by mouth daily.    Historical Provider, MD    Allergies Augmentin [amoxicillin-pot clavulanate]; Codeine; Latex; Neomycin; and Percocet [oxycodone-acetaminophen]  Family History  Problem Relation Age of Onset  . Diabetes Mother   . Alcohol abuse Father   . Arthritis Father   . Heart disease  Father   . Arthritis Sister   . Alcohol abuse Maternal Aunt   . Alcohol abuse Maternal Uncle   . Alcohol abuse Paternal Grandfather   . Arthritis Maternal Grandmother   . Schizophrenia Maternal Grandmother     Social History Social History  Substance Use Topics  . Smoking status: Former Smoker    Packs/day: 1.00    Years: 10.00    Types: Cigarettes    Quit date: 08/22/1991  . Smokeless tobacco: Never Used  . Alcohol use 0.0 oz/week     Comment: twice a year     Review of Systems Constitutional: No fever/chills Eyes: No  visual changes. ENT: No sore throat. Cardiovascular: Denies chest pain.Does report chest pain that she had for a half a day about 2 weeks ago, but has not had a return. Respiratory: Denies shortness of breath. Gastrointestinal: No abdominal pain.  No nausea, no vomiting.  No diarrhea.  No constipation. Genitourinary: Negative for dysuria. Musculoskeletal: Negative for back pain. Skin: Negative for rash. Neurological: Negative for focal weakness or numbness. Had mild headache off and on for a couple days, reports she's had previous similar headaches many times in the past. No numbness weakness or trouble speaking associated. Similar headaches in the past. Not sudden in onset, not severe in nature.  10-point ROS otherwise negative.  ____________________________________________   PHYSICAL EXAM:  VITAL SIGNS: ED Triage Vitals [11/01/16 1359]  Enc Vitals Group     BP (!) 157/86     Pulse Rate 76     Resp 20     Temp 98.1 F (36.7 C)     Temp Source Oral     SpO2 100 %     Weight      Height      Head Circumference      Peak Flow      Pain Score      Pain Loc      Pain Edu?      Excl. in [place]?     Constitutional: Alert and oriented. Well appearing and in no acute distress. Eyes: Conjunctivae are normal. PERRL. EOMI. Head: Atraumatic. Nose: No congestion/rhinnorhea. Mouth/Throat: Mucous membranes are moist.  Oropharynx non-erythematous. Neck: No stridor.   Cardiovascular: Normal rate, regular rhythm. Grossly normal heart sounds.  Good peripheral circulation. Respiratory: Normal respiratory effort.  No retractions. Lungs CTAB. Gastrointestinal: Soft and nontender. No distention.  Musculoskeletal: No lower extremity tenderness nor edema.  No joint effusions. Neurologic:  Normal speech and language. No gross focal neurologic deficits are appreciated. No gait instability. Skin:  Skin is warm, dry and intact. No rash noted. Psychiatric: Mood and affect are normal. Speech and  behavior are normal.  ____________________________________________   LABS (all labs ordered are listed, but only abnormal results are displayed)  Labs Reviewed  BASIC METABOLIC PANEL - Abnormal; Notable for the following:       Result Value   Glucose, Bld 105 (*)    All other components within normal limits  CBC - Abnormal; Notable for the following:    Hemoglobin 10.7 (*)    HCT 32.7 (*)    MCV 75.7 (*)    MCH 24.8 (*)    RDW 16.0 (*)    All other components within normal limits  TROPONIN I  POCT PREGNANCY, URINE  POC URINE PREG, ED   ____________________________________________  EKG  Reviewed and interpreted by me at 1405 Ventricular rate 80 QRS 80 QTc 435 Normal sinus rhythm, slight baseline artifact, no evidence  of acute ischemia or ectopy, there is a slight biphasic appearance the T wave in V2, nonspecific. No new Q waves or evidence of acute infarction. ____________________________________________  RADIOLOGY Dg Chest 2 View  Result Date: 11/01/2016 CLINICAL DATA:  Atrial fibrillation, hypertension, productive cough, leg swelling, chest pain, nausea, dizziness and headaches. EXAM: CHEST  2 VIEW COMPARISON:  06/26/2015. FINDINGS: Trachea is midline. Heart size normal. Lungs are clear. No pleural fluid. IMPRESSION: No acute findings. Electronically Signed   By: Lorin Picket M.D.   On: 11/01/2016 14:41   US Venous Img Lower Bilateral  Result Date: 11/01/2016 CLINICAL DATA:  Initial evaluation for lower extremity swelling for 2 weeks. EXAM: BILATERAL LOWER EXTREMITY VENOUS DOPPLER ULTRASOUND TECHNIQUE: Gray-scale sonography with graded compression, as well as color Doppler and duplex ultrasound were performed to evaluate the lower extremity deep venous systems from the level of the common femoral vein and including the common femoral, femoral, profunda femoral, popliteal and calf veins including the posterior tibial, peroneal and gastrocnemius veins when visible. The  superficial great saphenous vein was also interrogated. Spectral Doppler was utilized to evaluate flow at rest and with distal augmentation maneuvers in the common femoral, femoral and popliteal veins. COMPARISON:  None. FINDINGS: RIGHT LOWER EXTREMITY Common Femoral Vein: No evidence of thrombus. Normal compressibility, respiratory phasicity and response to augmentation. Saphenofemoral Junction: No evidence of thrombus. Normal compressibility and flow on color Doppler imaging. Profunda Femoral Vein: No evidence of thrombus. Normal compressibility and flow on color Doppler imaging. Femoral Vein: No evidence of thrombus. Normal compressibility, respiratory phasicity and response to augmentation. Popliteal Vein: No evidence of thrombus. Normal compressibility, respiratory phasicity and response to augmentation. Calf Veins: No evidence of thrombus. Normal compressibility and flow on color Doppler imaging. Superficial Great Saphenous Vein: No evidence of thrombus. Normal compressibility and flow on color Doppler imaging. Venous Reflux:  None. Other Findings:  None. LEFT LOWER EXTREMITY Common Femoral Vein: No evidence of thrombus. Normal compressibility, respiratory phasicity and response to augmentation. Saphenofemoral Junction: No evidence of thrombus. Normal compressibility and flow on color Doppler imaging. Profunda Femoral Vein: No evidence of thrombus. Normal compressibility and flow on color Doppler imaging. Femoral Vein: No evidence of thrombus. Normal compressibility, respiratory phasicity and response to augmentation. Popliteal Vein: No evidence of thrombus. Normal compressibility, respiratory phasicity and response to augmentation. Calf Veins: No evidence of thrombus. Normal compressibility and flow on color Doppler imaging. Superficial Great Saphenous Vein: No evidence of thrombus. Normal compressibility and flow on color Doppler imaging. Venous Reflux:  None. Other Findings:  None. IMPRESSION: No evidence  of deep venous thrombosis. Electronically Signed   By: Jeannine Boga M.D.   On: 11/01/2016 17:45    ____________________________________________   PROCEDURES  Procedure(s) performed: None  Procedures  Critical Care performed: No  ____________________________________________   INITIAL IMPRESSION / ASSESSMENT AND PLAN / ED COURSE  Pertinent labs & imaging results that were available during my care of the patient were reviewed by me and considered in my medical decision making (see chart for details).  Patient's present complaint and concern is swelling in her ankles. Seems very positional in nature, worsened by standing at her job. No significant risk factors for venous trauma when was him, but we'll evaluate using ultrasound. At this point she only has trace edema at the ankles bilaterally.  Interestingly, she also reports that she had an episode of chest pain about 2 weeks ago, her EKG today does not demonstrate an obvious new infarct or ischemia, and her  troponin is normal.  Chest x-ray clear. No respiratory complaint. No recent fevers chills or infectious symptoms. No new medication changes.  ----------------------------------------- 4:53 PM on 11/01/2016 -----------------------------------------  Case discussed with Dr. Octavia Bruckner gone, he agrees with plan to follow up closely as an outpatient. He will have his schedule her call the patient tomorrow morning to set up a follow-up appointment this week.    Return precautions and treatment recommendations and follow-up discussed with the patient who is agreeable with the plan.  ____________________________________________   FINAL CLINICAL IMPRESSION(S) / ED DIAGNOSES  Final diagnoses:  Bilateral lower extremity edema      NEW MEDICATIONS STARTED DURING THIS VISIT:  New Prescriptions   No medications on file     Note:  This document was prepared using Dragon voice recognition software and may include  unintentional dictation errors.     Delman Kitten, MD 11/01/16 [phone]

## 2016-11-01 NOTE — ED Triage Notes (Signed)
Pt presents with headache, leg swelling and chest discomfort since last week.

## 2016-11-01 NOTE — ED Notes (Signed)
ED Provider at bedside. 

## 2016-11-01 NOTE — Discharge Instructions (Signed)
Please follow up with Dr. Rockey Situ in the next few days regarding today?s emergent visit and your recent symptoms to discuss further management.  Continue to take your regular medications.  Return to the Emergency Department (ED) if you experience further chest pain/pressure/tightness, difficulty breathing, or sudden sweating, or other symptoms that concern you.

## 2016-11-01 NOTE — Telephone Encounter (Signed)
Pt c/o of Chest Pain: STAT if CP now or developed within 24 hours  1. Are you having CP right now? No last episode 2 weeks ago   2. Are you experiencing any other symptoms (ex. SOB, nausea, vomiting, sweating)?  Headache elevated bp 140/80 ankles and feet swelling sob nausea vomiting sweating   3. How long have you been experiencing CP? One episode 2 weeks ago   4. Is your CP continuous or coming and going?  Once episode cp but other sx are frequent   5. Have you taken Nitroglycerin? No but took flecainide and it helped     Patient wanted to be seen sooner than 1st available  And wants to avoid er

## 2016-11-01 NOTE — Telephone Encounter (Signed)
Spoke w/ pt.  She has not been seen in our office in almost 3 years. She reports leg & ankle edema, HA, nausea and an overall sense of not feeling well.  She has h/o afib, she had chest pain ~2 wks ago, she took a flecainide and laid down in a dark room until sx resolved.   She works in a doctor's office herself, would like to know if she could be worked in to be seen today. Advised pt that we do not have openings today, but if she is symptomatic, she should have someone take her to the ED. Pt would like to avoid the ED if possible.   Advised pt to proceed to Urgent Care and if her sx are urgent, they will send her to ED. Asked her to call back to set up appt to re-establish care in our office and to call back if we can be of further assistance.  She is appreciative of the call.

## 2016-11-05 DIAGNOSIS — M7989 Other specified soft tissue disorders: Secondary | ICD-10-CM | POA: Insufficient documentation

## 2016-11-05 DIAGNOSIS — R079 Chest pain, unspecified: Secondary | ICD-10-CM | POA: Insufficient documentation

## 2016-11-05 NOTE — Progress Notes (Signed)
Cardiology Office Note  Date:  11/06/2016   ID:  [name] [name], DOB 04-03-1966, MRN [id number]  PCP:  Lorelee Market, MD   Chief Complaint  Patient presents with  . other    Pt. was at [facility] with HTN, LE edema and chest pain on November 01, 2016. Pt.c/o LE edema, more on the left, chest tightness and shortness of breath.  Meds reviewed by the pt. verbally.     HPI:  [name] is a 51 year old woman with history of GERD, presenting to the hospital 03/27/2013 with tachycardia, shortness of breath. Diagnosed with atrial fibrillation with RVR, heart rate 140s. She was started on Cardizem, beta blocker with improved heart rate control, converting to normal sinus rhythm with short runs of tachycardia. She was discharged on Cardizem 4 times a day, metoprolol 25 mg twice a day. She presents today for follow-up of her paroxysmal atrial fibrillation  She denies any tachycardia concerning for atrial fibrillation She stop metoprolol and flecainide Felt the metoprolol was causing fatigue  Since her last clinic visit she's had 20 pound weight can Lots of sweet tea  Reports that she had severe headache recently, hypertension Went to the emergency room, records reviewed Also reported having severe leg swelling She had lower extremity Doppler showing no DVT Headache now resolved, blood pressure back to her baseline She does not have blood pressure cuff at home  EKG personally reviewed by myself showing normal sinus rhythm with rate 71 bpm no significant ST or T-wave changes  Other past medical history reviewed stopped all of her meds in May 2015,  She was doing well until October 2015, when she started having sx of fluttering, hard heart beats.  Echocardiogram August 2014 showing normal LV systolic function, LV diastolic relaxation abnormality, normal right ventricular systolic pressure TSH in the hospital was normal, 1.9   PMH:   has a past medical history of A-fib ([place]); Arthritis;  Chicken pox; Depression; GERD (gastroesophageal reflux disease); and Migraines.  PSH:    Past Surgical History:  Procedure Laterality Date  . APPENDECTOMY  5/11  . C-spine surgery    . CESAREAN SECTION  10/98,9/12  . TONSILLECTOMY  03/2008    Current Outpatient Prescriptions  Medication Sig Dispense Refill  . flecainide (TAMBOCOR) 50 MG tablet Take 1 tablet (50 mg total) by mouth 2 (two) times daily. (Patient taking differently: Take 50 mg by mouth 2 (two) times daily as needed. ) 60 tablet 11  . ibuprofen (ADVIL,MOTRIN) 600 MG tablet Take 1 tablet (600 mg total) by mouth every 8 (eight) hours as needed. 20 tablet 0  . lamoTRIgine (LAMICTAL) 100 MG tablet Take 100 mg by mouth daily.    Marland Kitchen lithium carbonate 150 MG capsule Take 150 mg by mouth daily.    Marland Kitchen omeprazole (PRILOSEC) 20 MG capsule Take 20 mg by mouth daily.    . Oxcarbazepine (TRILEPTAL) 300 MG tablet Take 900 mg by mouth daily.     No current facility-administered medications for this visit.      Allergies:   Augmentin [amoxicillin-pot clavulanate]; Codeine; Latex; Neomycin; and Percocet [oxycodone-acetaminophen]   Social History:  The patient  reports that she quit smoking about 25 years ago. Her smoking use included Cigarettes. She has a 10.00 pack-year smoking history. She has never used smokeless tobacco. She reports that she drinks alcohol. She reports that she does not use drugs.   Family History:   family history includes Alcohol abuse in her father, maternal aunt, maternal uncle, and  paternal grandfather; Arthritis in her father, maternal grandmother, and sister; Diabetes in her mother; Heart disease in her father; Schizophrenia in her maternal grandmother.    Review of Systems: Review of Systems  Constitutional: Negative.        Weight gain  Respiratory: Negative.   Cardiovascular: Positive for leg swelling.  Gastrointestinal: Negative.   Musculoskeletal: Negative.   Neurological: Positive for headaches.   Psychiatric/Behavioral: Negative.   All other systems reviewed and are negative.    PHYSICAL EXAM: VS:  BP 130/74 (BP Location: Left Arm, Patient Position: Sitting, Cuff Size: Normal)   Pulse 71   Ht 5\' 5"  (1.651 m)   Wt 194 lb 8 oz (88.2 kg)   LMP 10/25/2016   BMI 32.37 kg/m  , BMI Body mass index is 32.37 kg/m. GEN: Well nourished, well developed, in no acute distress, obese  HEENT: normal  Neck: no JVD, carotid bruits, or masses Cardiac: RRR; no murmurs, rubs, or gallops,no edema  Respiratory:  clear to auscultation bilaterally, normal work of breathing GI: soft, nontender, nondistended, + BS MS: no deformity or atrophy  Skin: warm and dry, no rash Neuro:  Strength and sensation are intact Psych: euthymic mood, full affect    Recent Labs: 11/01/2016: BUN 10; Creatinine, Ser 0.71; Hemoglobin 10.7; Platelets 439; Potassium 3.9; Sodium 139    Lipid Panel Lab Results  Component Value Date   CHOL 173 03/27/2013   HDL 45 03/27/2013   LDLCALC 111 (H) 03/27/2013   TRIG 84 03/27/2013      Wt Readings from Last 3 Encounters:  11/06/16 194 lb 8 oz (88.2 kg)  04/02/16 185 lb (83.9 kg)  06/26/15 176 lb (79.8 kg)       ASSESSMENT AND PLAN:  Paroxysmal atrial fibrillation ([place]) - Plan: EKG 12-Lead We have refilled her metoprolol and flecainide for her to take as needed She does not want to take these every day Denies any recent episodes concerning for arrhythmia  Bipolar I disorder, most recent episode depressed ([place]) - Plan: EKG 12-Lead Reports her symptoms are stable Feels she is having weight gain from her medications  Gastroesophageal reflux disease without esophagitis - Plan: EKG 12-Lead Periodic Fullness in the chest Recommended she try Pepcid and Tums as needed  Chest pain, unspecified type - Plan: EKG 12-Lead Suspect secondary to GERD  Leg swelling - Plan: EKG 12-Lead Likely from weight gain, unable to exclude fluid retention Suggested she try Lasix  with potassium over-the-counter as needed for severe leg swelling Emergency room records reviewed   Total encounter time more than 25 minutes  Greater than 50% was spent in counseling and coordination of care with the patient   Disposition:   F/U  6 months   Orders Placed This Encounter  Procedures  . EKG 12-Lead     Signed, Esmond Plants, M.D., Ph.D. 11/06/2016  [place], [place]

## 2016-11-06 ENCOUNTER — Ambulatory Visit (INDEPENDENT_AMBULATORY_CARE_PROVIDER_SITE_OTHER): Payer: BLUE CROSS/BLUE SHIELD | Admitting: Cardiovascular Disease

## 2016-11-06 ENCOUNTER — Encounter: Payer: Self-pay | Admitting: Cardiovascular Disease

## 2016-11-06 VITALS — BP 130/74 | HR 71 | Ht 65.0 in | Wt 194.5 lb

## 2016-11-06 DIAGNOSIS — I480 Paroxysmal atrial fibrillation: Secondary | ICD-10-CM | POA: Diagnosis not present

## 2016-11-06 DIAGNOSIS — K219 Gastro-esophageal reflux disease without esophagitis: Secondary | ICD-10-CM | POA: Diagnosis not present

## 2016-11-06 DIAGNOSIS — R079 Chest pain, unspecified: Secondary | ICD-10-CM | POA: Diagnosis not present

## 2016-11-06 DIAGNOSIS — F3130 Bipolar disorder, current episode depressed, mild or moderate severity, unspecified: Secondary | ICD-10-CM | POA: Diagnosis not present

## 2016-11-06 DIAGNOSIS — M7989 Other specified soft tissue disorders: Secondary | ICD-10-CM

## 2016-11-06 MED ORDER — METOPROLOL TARTRATE 25 MG PO TABS: 25.0000 mg | ORAL_TABLET | Freq: Two times a day (BID) | ORAL | 11 refills | Status: DC | PRN

## 2016-11-06 MED ORDER — FLECAINIDE ACETATE 50 MG PO TABS: 50.0000 mg | ORAL_TABLET | Freq: Two times a day (BID) | ORAL | 11 refills | Status: DC | PRN

## 2016-11-06 MED ORDER — FUROSEMIDE 20 MG PO TABS: 20.0000 mg | ORAL_TABLET | Freq: Every day | ORAL | 3 refills | Status: DC | PRN

## 2016-11-06 NOTE — Patient Instructions (Addendum)
Watch the sweet tea   Medication Instructions:   Please take lasix with potassium sparingly for leg swelling  Take metoprolol and flecainide for atrial fibrillation  Labwork:  No new labs needed  Testing/Procedures:  No further testing at this time   I recommend watching educational videos on topics of interest to you at:       [email or web address].com  Enter code: [email or web address]    Follow-Up: It was a pleasure seeing you in the office today. Please call us if you have new issues that need to be addressed before your next appt.  [phone]  Your physician wants you to follow-up in: 12 months.  You will receive a reminder letter in the mail two months in advance. If you don't receive a letter, please call our office to schedule the follow-up appointment.  If you need a refill on your cardiac medications before your next appointment, please call your pharmacy.

## 2016-11-15 ENCOUNTER — Ambulatory Visit: Payer: Self-pay | Admitting: Podiatry

## 2016-11-29 IMAGING — CR DG CHEST 2V
2 series · 2 of 2 positions shown · non-contrast
Comparison: 11/25/2014

CLINICAL DATA: 49-year-old female with a history of chest pain and
tightness

EXAM:
CHEST - 2 VIEW

[chest pa]
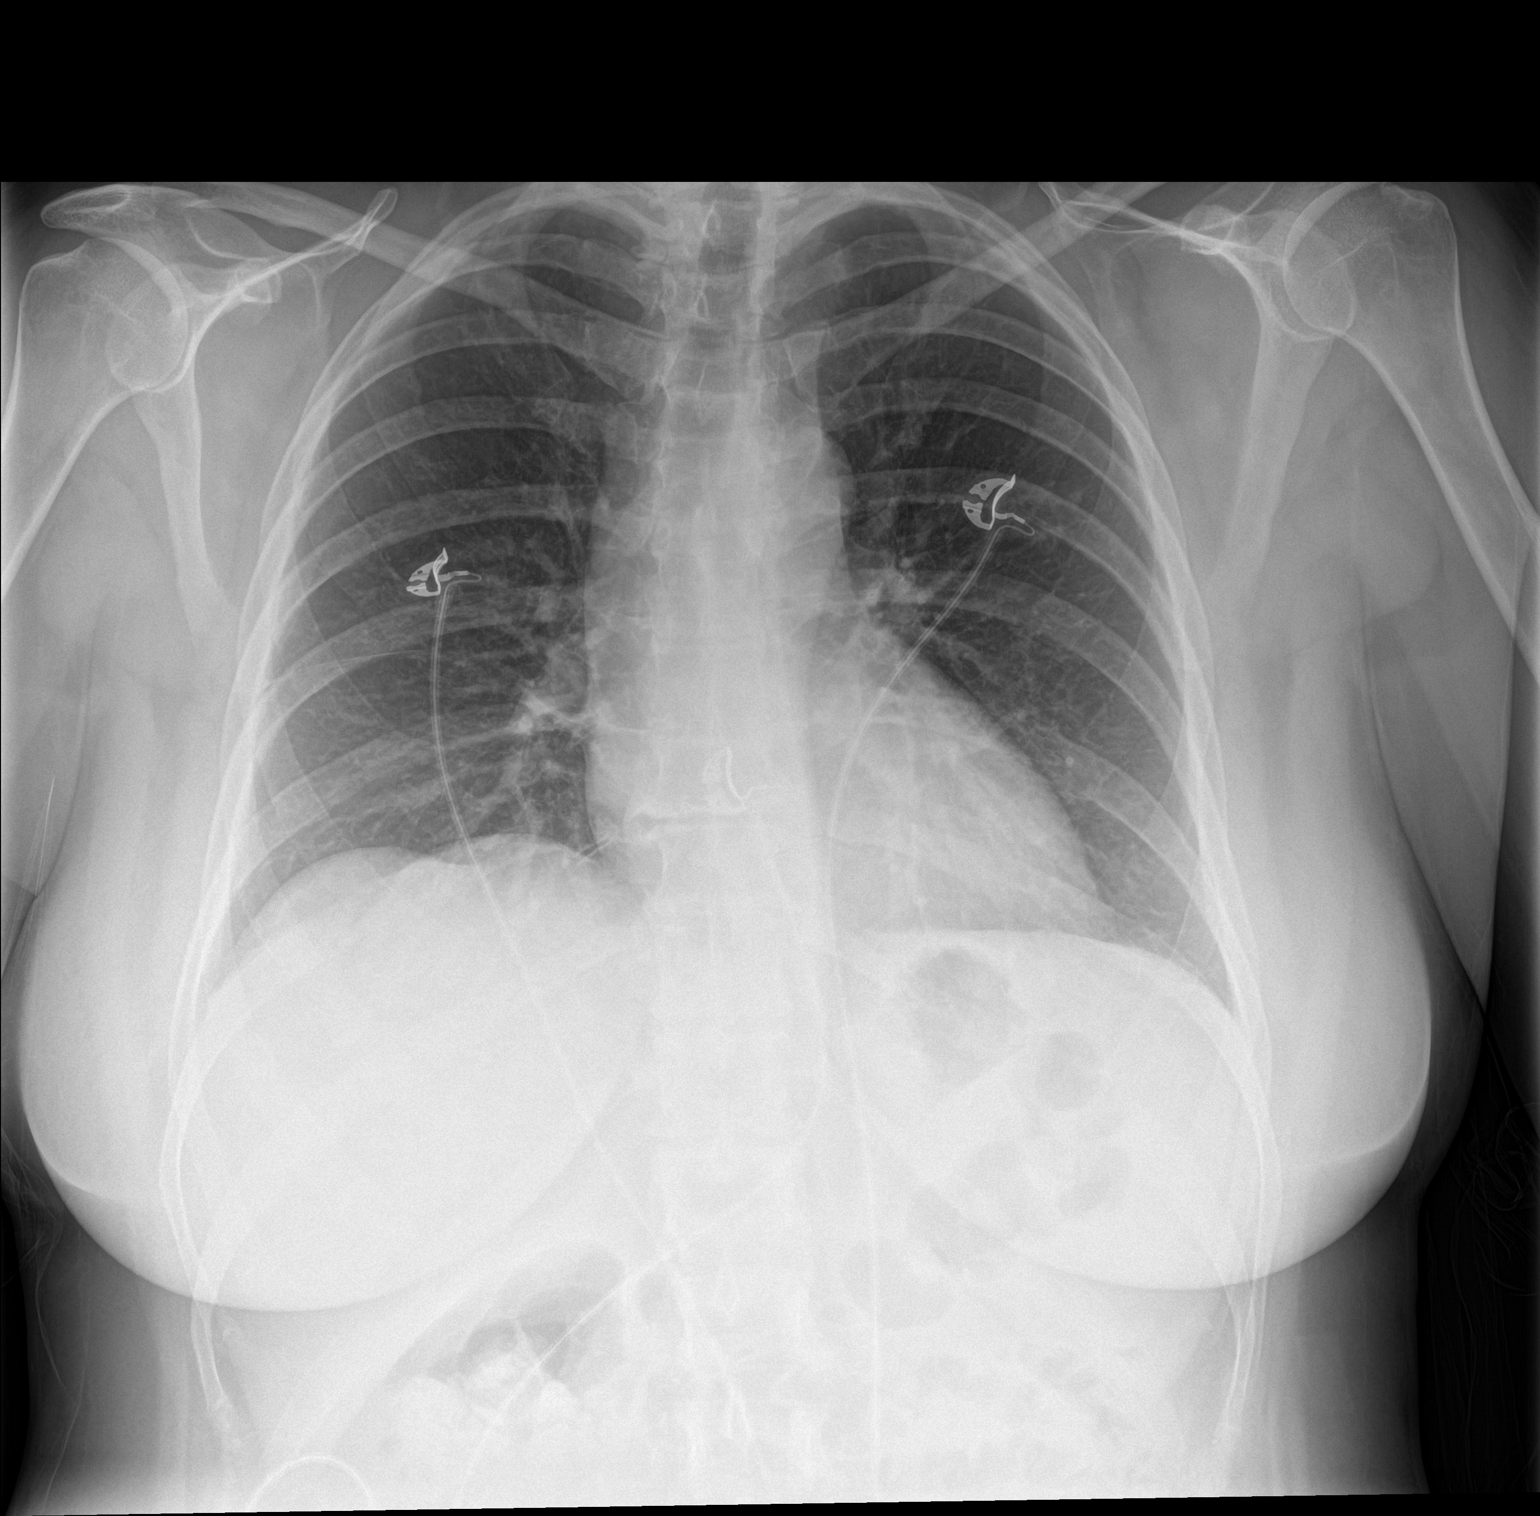

[chest lat]
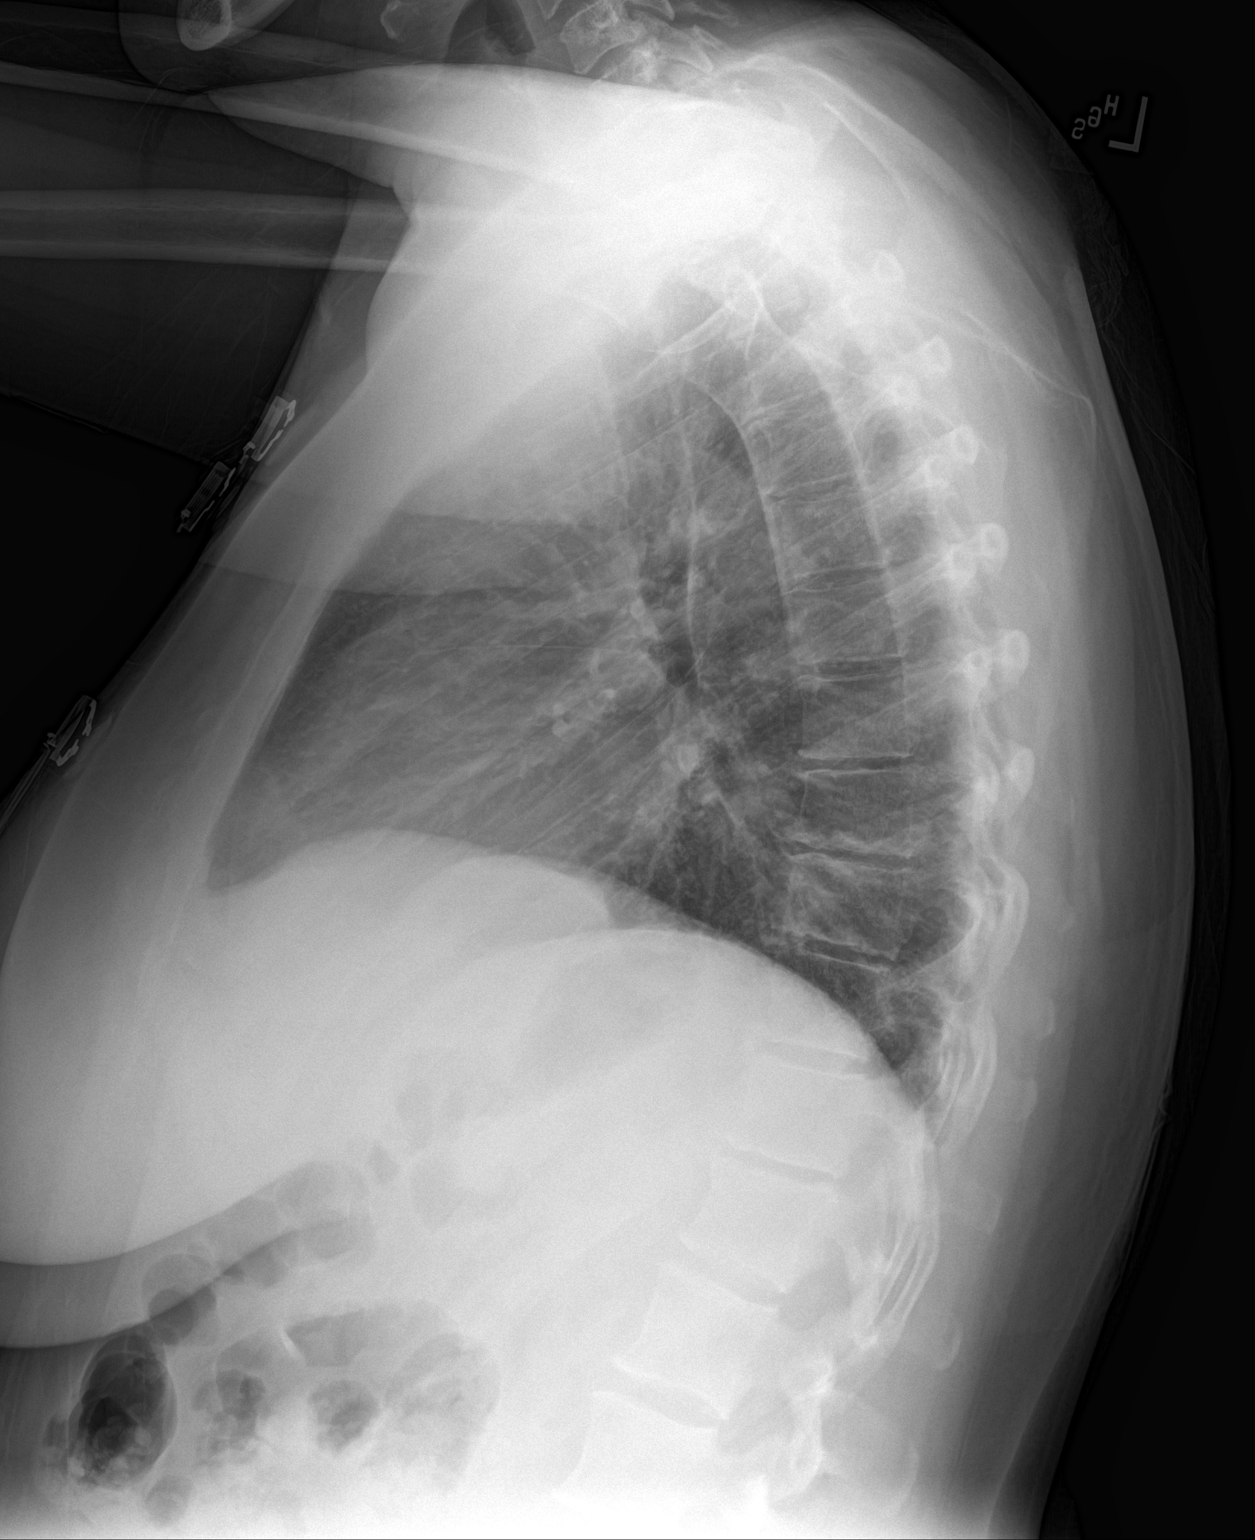

[2 of 2 positions shown; findings below may reference images not displayed]

FINDINGS: Cardiomediastinal silhouette projects within normal limits in size
and contour. No confluent airspace disease, pneumothorax, or pleural
effusion.

No displaced fracture.

Unremarkable appearance of the upper abdomen.
IMPRESSION: No radiographic evidence of acute cardiopulmonary disease.

## 2016-12-01 ENCOUNTER — Ambulatory Visit (INDEPENDENT_AMBULATORY_CARE_PROVIDER_SITE_OTHER): Payer: BLUE CROSS/BLUE SHIELD | Admitting: Podiatry

## 2016-12-01 DIAGNOSIS — M25579 Pain in unspecified ankle and joints of unspecified foot: Secondary | ICD-10-CM | POA: Diagnosis not present

## 2016-12-01 DIAGNOSIS — M7751 Other enthesopathy of right foot: Secondary | ICD-10-CM | POA: Diagnosis not present

## 2016-12-01 DIAGNOSIS — M659 Synovitis and tenosynovitis, unspecified: Secondary | ICD-10-CM

## 2016-12-01 DIAGNOSIS — M25572 Pain in left ankle and joints of left foot: Secondary | ICD-10-CM | POA: Diagnosis not present

## 2016-12-01 DIAGNOSIS — M7752 Other enthesopathy of left foot: Secondary | ICD-10-CM

## 2016-12-01 DIAGNOSIS — Z8781 Personal history of (healed) traumatic fracture: Secondary | ICD-10-CM

## 2016-12-01 DIAGNOSIS — M65972 Unspecified synovitis and tenosynovitis, left ankle and foot: Secondary | ICD-10-CM

## 2016-12-04 NOTE — Progress Notes (Signed)
   Subjective:  Patient presents today for gradually worsening, dull, throbbing pain and tenderness to the left ankle. She is also concerned for a possible ingrown toenail of the left great toe. She complains of bilateral foot pain. The symptoms have been present for the past 6-8 months. Standing and ambulation increases her pain. She has been taking ibuprofen that has helped to provide some relief. Patient presents for further treatment and evaluation.  Objective / Physical Exam:  General:  The patient is alert and oriented x3 in no acute distress. Dermatology:  Skin is warm, dry and supple bilateral lower extremities. Negative for open lesions or macerations. Vascular:  Palpable pedal pulses bilaterally. No edema or erythema noted. Capillary refill within normal limits. Neurological:  Epicritic and protective threshold grossly intact bilaterally.  Musculoskeletal Exam:  Pain on palpation to the anterior lateral medial aspects of the patient's left ankle. Mild edema noted. Pain with palpation to the first and second digits of the right foot.  Range of motion within normal limits to all pedal and ankle joints bilateral. Muscle strength 5/5 in all groups bilateral.   Radiographic Exam:  Normal osseous mineralization. Joint spaces preserved. No fracture/dislocation/boney destruction.    Assessment: #1 capsulitis first and second MPJ-right #2 left ankle pain #3 history of a left ankle fracture   Plan of Care:  #1 Patient was evaluated. #2 injection of 0.5 mL Celestone Soluspan injected in the patient's left ankle. #3 injection of 0.5 mL Celestone Soluspan injected into the right first MPJ #4 discussed possible future surgical management. #5 return to clinic in 4 weeks.   Edrick Kins, DPM [facility]  Dr. Edrick Kins, [address]                                        [place], [place] [address]                Office [phone]  Fax [phone]

## 2016-12-08 MED ORDER — BETAMETHASONE SOD PHOS & ACET 6 (3-3) MG/ML IJ SUSP: 3.0000 mg | Freq: Once | INTRAMUSCULAR | Status: DC

## 2016-12-14 ENCOUNTER — Other Ambulatory Visit: Payer: Self-pay | Admitting: Obstetrics and Gynecology

## 2016-12-14 ENCOUNTER — Ambulatory Visit (INDEPENDENT_AMBULATORY_CARE_PROVIDER_SITE_OTHER): Payer: BLUE CROSS/BLUE SHIELD | Admitting: Obstetrics and Gynecology

## 2016-12-14 ENCOUNTER — Encounter: Payer: Self-pay | Admitting: Obstetrics and Gynecology

## 2016-12-14 VITALS — BP 138/88 | HR 98 | Ht 66.0 in | Wt 195.9 lb

## 2016-12-14 DIAGNOSIS — N938 Other specified abnormal uterine and vaginal bleeding: Secondary | ICD-10-CM

## 2016-12-14 DIAGNOSIS — D649 Anemia, unspecified: Secondary | ICD-10-CM | POA: Diagnosis not present

## 2016-12-14 DIAGNOSIS — Z01411 Encounter for gynecological examination (general) (routine) with abnormal findings: Secondary | ICD-10-CM

## 2016-12-14 DIAGNOSIS — E559 Vitamin D deficiency, unspecified: Secondary | ICD-10-CM

## 2016-12-14 DIAGNOSIS — F3281 Premenstrual dysphoric disorder: Secondary | ICD-10-CM | POA: Diagnosis not present

## 2016-12-14 DIAGNOSIS — E669 Obesity, unspecified: Secondary | ICD-10-CM

## 2016-12-14 DIAGNOSIS — D259 Leiomyoma of uterus, unspecified: Secondary | ICD-10-CM | POA: Diagnosis not present

## 2016-12-14 MED ORDER — DESOGESTREL-ETHINYL ESTRADIOL 0.15-0.02/0.01 MG (21/5) PO TABS: 1.0000 | ORAL_TABLET | Freq: Every day | ORAL | 11 refills | Status: DC

## 2016-12-14 MED ORDER — VITAMIN D (ERGOCALCIFEROL) 1.25 MG (50000 UNIT) PO CAPS: 50000.0000 [IU] | ORAL_CAPSULE | ORAL | 1 refills | Status: DC
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
## 2016-12-14 NOTE — Patient Instructions (Signed)
Dysfunctional Uterine Bleeding Dysfunctional uterine bleeding is abnormal bleeding from the uterus. Dysfunctional uterine bleeding includes:  A period that comes earlier or later than usual.  A period that is lighter, heavier, or has blood clots.  Bleeding between periods.  Skipping one or more periods.  Bleeding after sexual intercourse.  Bleeding after menopause. Follow these instructions at home: Pay attention to any changes in your symptoms. Follow these instructions to help with your condition: Eating and drinking   Eat well-balanced meals. Include foods that are high in iron, such as liver, meat, shellfish, green leafy vegetables, and eggs.  If you become constipated:  Drink plenty of water.  Eat fruits and vegetables that are high in water and fiber, such as spinach, carrots, raspberries, apples, and mango. Medicines   Take over-the-counter and prescription medicines only as told by your health care provider.  Do not change medicines without talking with your health care provider.  Aspirin or medicines that contain aspirin may make the bleeding worse. Do not take those medicines:  During the week before your period.  During your period.  If you were prescribed iron pills, take them as told by your health care provider. Iron pills help to replace iron that your body loses because of this condition. Activity   If you need to change your sanitary pad or tampon more than one time every 2 hours:  Lie in bed with your feet raised (elevated).  Place a cold pack on your lower abdomen.  Rest as much as possible until the bleeding stops or slows down.  Do not try to lose weight until the bleeding has stopped and your blood iron level is back to normal. Other Instructions   For two months, write down:  When your period starts.  When your period ends.  When any abnormal bleeding occurs.  What problems you notice.  Keep all follow up visits as told by your  health care provider. This is important. Contact a health care provider if:  You get light-headed or weak.  You have nausea and vomiting.  You cannot eat or drink without vomiting.  You feel dizzy or have diarrhea while you are taking medicines.  You are taking birth control pills or hormones, and you want to change them or stop taking them. Get help right away if:  You develop a fever or chills.  You need to change your sanitary pad or tampon more than one time per hour.  Your bleeding becomes heavier, or your flow contains clots more often.  You develop pain in your abdomen.  You lose consciousness.  You develop a rash. This information is not intended to replace advice given to you by your health care provider. Make sure you discuss any questions you have with your health care provider. Document Released: 08/04/2000 Document Revised: 01/13/2016 Document Reviewed: 11/02/2014 Elsevier Interactive Patient Education  2017 Elsevier Inc.  

## 2016-12-18 LAB — CYTOLOGY - PAP

## 2016-12-26 ENCOUNTER — Emergency Department
Admission: EM | Admit: 2016-12-26 | Discharge: 2016-12-26 | Disposition: A | Discharge status: Home / Self Care | Payer: BLUE CROSS/BLUE SHIELD | Attending: Emergency Medicine | Admitting: Emergency Medicine

## 2016-12-26 ENCOUNTER — Emergency Department: Payer: BLUE CROSS/BLUE SHIELD

## 2016-12-26 ENCOUNTER — Encounter: Payer: Self-pay | Admitting: Emergency Medicine

## 2016-12-26 DIAGNOSIS — R079 Chest pain, unspecified: Secondary | ICD-10-CM | POA: Diagnosis present

## 2016-12-26 DIAGNOSIS — Z87891 Personal history of nicotine dependence: Secondary | ICD-10-CM | POA: Insufficient documentation

## 2016-12-26 DIAGNOSIS — F410 Panic disorder [episodic paroxysmal anxiety] without agoraphobia: Secondary | ICD-10-CM | POA: Diagnosis not present

## 2016-12-26 LAB — BASIC METABOLIC PANEL
ANION GAP: 7 (ref 5–15)
BUN: 10 mg/dL (ref 6–20)
CALCIUM: 9.7 mg/dL (ref 8.9–10.3)
CHLORIDE: 105 mmol/L (ref 101–111)
CO2: 26 mmol/L (ref 22–32)
CREATININE: 0.77 mg/dL (ref 0.44–1.00)
Glucose, Bld: 108 mg/dL — ABNORMAL HIGH (ref 65–99)
Potassium: 3.6 mmol/L (ref 3.5–5.1)
SODIUM: 138 mmol/L (ref 135–145)

## 2016-12-26 LAB — CBC
HCT: 33.7 % — ABNORMAL LOW (ref 35.0–47.0)
Hemoglobin: 10.8 g/dL — ABNORMAL LOW (ref 12.0–16.0)
MCH: 23.4 pg — ABNORMAL LOW (ref 26.0–34.0)
MCHC: 32 g/dL (ref 32.0–36.0)
MCV: 73.3 fL — AB (ref 80.0–100.0)
PLATELETS: 370 10*3/uL (ref 150–440)
RBC: 4.6 MIL/uL (ref 3.80–5.20)
RDW: 16.6 % — AB (ref 11.5–14.5)
WBC: 6.9 10*3/uL (ref 3.6–11.0)

## 2016-12-26 LAB — TROPONIN I

## 2016-12-26 MED ORDER — ACETAMINOPHEN 500 MG PO TABS
1000.0000 mg | ORAL_TABLET | Freq: Once | ORAL | Status: AC
  Administered 2016-12-26: 1000 mg via ORAL
  Filled 2016-12-26: qty 2

## 2016-12-26 MED ORDER — FERROUS SULFATE DRIED ER 160 (50 FE) MG PO TBCR: 160.0000 mg | EXTENDED_RELEASE_TABLET | Freq: Every day | ORAL | 6 refills | Status: DC

## 2016-12-26 MED ORDER — AZITHROMYCIN 250 MG PO TABS: ORAL_TABLET | ORAL | 0 refills | Status: DC

## 2016-12-26 MED ORDER — LORAZEPAM 0.5 MG PO TABS: 0.5000 mg | ORAL_TABLET | Freq: Three times a day (TID) | ORAL | 0 refills | Status: AC | PRN

## 2016-12-26 NOTE — ED Triage Notes (Signed)
C/O chest pain, neck pain, arm pain, pain in teeth since 0300.  States took a zantac at 0300 with no relief.  Also c/o SOB.

## 2016-12-26 NOTE — ED Provider Notes (Signed)
[facility] Emergency Department Provider Note       Time seen: ----------------------------------------- 8:01 AM on 12/26/2016 -----------------------------------------     I have reviewed the triage vital signs and the nursing notes.   HISTORY   Chief Complaint Chest Pain    HPI [name] [name] is a 51 y.o. female who presents to the ED for chest pain, neck pain, arm pain and teeth pain since 3:00. Patient took Zantac this morning at 3 AM with no relief. Patient also complains of shortness of breath. Denies history of this before, nothing makes it better or worse.   Past Medical History:  Diagnosis Date  . A-fib ([place])   . Arthritis   . Chicken pox   . Depression   . GERD (gastroesophageal reflux disease)   . Migraines     Patient Active Problem List   Diagnosis Date Noted  . Leg swelling 11/05/2016  . Chest pain 11/05/2016  . Encounter to establish care 12/10/2014  . Uterine fibroid 12/10/2014  . Bipolar I disorder, most recent episode depressed ([place]) 12/10/2014  . GERD (gastroesophageal reflux disease) 12/10/2014  . Back pain 12/10/2014  . Migraines 12/10/2014  . Fatigue 05/30/2013  . A-fib ([place]) 04/10/2013  . Inguinal cyst 02/10/2013    Past Surgical History:  Procedure Laterality Date  . APPENDECTOMY  5/11  . C-spine surgery    . CESAREAN SECTION  10/98,9/12  . TONSILLECTOMY  03/2008    Allergies Augmentin [amoxicillin-pot clavulanate]; Codeine; Latex; Neomycin; and Percocet [oxycodone-acetaminophen]  Social History Social History  Substance Use Topics  . Smoking status: Former Smoker    Packs/day: 1.00    Years: 10.00    Types: Cigarettes    Quit date: 08/22/1991  . Smokeless tobacco: Never Used  . Alcohol use 0.0 oz/week     Comment: twice a year     Review of Systems Constitutional: Negative for fever. Eyes: Negative for vision changes ENT:  Positive for congestion, tooth pain Cardiovascular: Positive for chest  pain Respiratory: Positive for shortness of breath Gastrointestinal: Negative for abdominal pain, vomiting and diarrhea. Genitourinary: Negative for dysuria. Musculoskeletal: Positive for recent back pain, neck pain Skin: Negative for rash. Neurological: Negative for headaches, focal weakness or numbness.  All systems negative/normal/unremarkable except as stated in the HPI  ____________________________________________   PHYSICAL EXAM:  VITAL SIGNS: ED Triage Vitals  Enc Vitals Group     BP 12/26/16 0756 135/76     Pulse Rate 12/26/16 0756 79     Resp 12/26/16 0756 18     Temp 12/26/16 0756 98 F (36.7 C)     Temp Source 12/26/16 0756 Oral     SpO2 12/26/16 0756 98 %     Weight 12/26/16 0758 195 lb (88.5 kg)     Height 12/26/16 0758 5\' 5"  (1.651 m)     Head Circumference --      Peak Flow --      Pain Score 12/26/16 0754 5     Pain Loc --      Pain Edu? --      Excl. in [place]? --     Constitutional: Alert and oriented. Well appearing and in no distress. Eyes: Conjunctivae are normal. PERRL. Normal extraocular movements. ENT   Head: Normocephalic and atraumatic.   Nose: No congestion/rhinnorhea.   Mouth/Throat: Mucous membranes are moist.   Neck: No stridor. Cardiovascular: Normal rate, regular rhythm. No murmurs, rubs, or gallops. Respiratory: Normal respiratory effort without tachypnea nor retractions. Breath  sounds are clear and equal bilaterally. No wheezes/rales/rhonchi. Gastrointestinal: Soft and nontender. Normal bowel sounds Musculoskeletal: Nontender with normal range of motion in extremities. No lower extremity tenderness nor edema. Neurologic:  Normal speech and language. No gross focal neurologic deficits are appreciated.  Skin:  Skin is warm, dry and intact. No rash noted. Psychiatric: Mood and affect are normal. Speech and behavior are normal.  ____________________________________________  EKG: Interpreted by me.Sinus rhythm with a rate of 70  bpm, normal PR interval, normal QRS, normal QT.  ____________________________________________  ED COURSE:  Pertinent labs & imaging results that were available during my care of the patient were reviewed by me and considered in my medical decision making (see chart for details). Patient presents for pain of uncertain etiology, we will assess with labs and imaging as indicated.   Procedures ____________________________________________   LABS (pertinent positives/negatives)  Labs Reviewed  BASIC METABOLIC PANEL - Abnormal; Notable for the following:       Result Value   Glucose, Bld 108 (*)    All other components within normal limits  CBC - Abnormal; Notable for the following:    Hemoglobin 10.8 (*)    HCT 33.7 (*)    MCV 73.3 (*)    MCH 23.4 (*)    RDW 16.6 (*)    All other components within normal limits  TROPONIN I    RADIOLOGY Images were viewed by me  Chest x-ray Is normal ____________________________________________  FINAL ASSESSMENT AND PLAN  Chest pain  Plan: Patient's labs and imaging were dictated above. Patient had presented for chest pain that was most severe last night. Her labs and workup have been negative. She does have evidence likely of iron deficiency anemia. She likely had a panic attack last night and I will prescribe anxiety medicine for her. Overall she is stable for outpatient follow-up.   Earleen Newport, MD   Note: This note was generated in part or whole with voice recognition software. Voice recognition is usually quite accurate but there are transcription errors that can and very often do occur. I apologize for any typographical errors that were not detected and corrected.     Earleen Newport, MD 12/26/16 [phone]

## 2016-12-28 ENCOUNTER — Encounter: Payer: BLUE CROSS/BLUE SHIELD | Admitting: Obstetrics and Gynecology

## 2016-12-28 ENCOUNTER — Other Ambulatory Visit: Payer: BLUE CROSS/BLUE SHIELD

## 2016-12-29 ENCOUNTER — Ambulatory Visit: Payer: BLUE CROSS/BLUE SHIELD | Admitting: Podiatry

## 2017-01-05 ENCOUNTER — Ambulatory Visit (INDEPENDENT_AMBULATORY_CARE_PROVIDER_SITE_OTHER): Payer: BLUE CROSS/BLUE SHIELD

## 2017-01-05 ENCOUNTER — Ambulatory Visit (INDEPENDENT_AMBULATORY_CARE_PROVIDER_SITE_OTHER): Payer: BLUE CROSS/BLUE SHIELD | Admitting: Obstetrics and Gynecology

## 2017-01-05 VITALS — BP 146/74 | HR 67 | Ht 65.0 in | Wt 194.1 lb

## 2017-01-05 DIAGNOSIS — N938 Other specified abnormal uterine and vaginal bleeding: Secondary | ICD-10-CM | POA: Diagnosis not present

## 2017-01-05 DIAGNOSIS — D259 Leiomyoma of uterus, unspecified: Secondary | ICD-10-CM

## 2017-01-05 DIAGNOSIS — J40 Bronchitis, not specified as acute or chronic: Secondary | ICD-10-CM | POA: Diagnosis not present

## 2017-01-05 DIAGNOSIS — N920 Excessive and frequent menstruation with regular cycle: Secondary | ICD-10-CM

## 2017-01-05 DIAGNOSIS — N946 Dysmenorrhea, unspecified: Secondary | ICD-10-CM

## 2017-01-05 MED ORDER — SERTRALINE HCL 50 MG PO TABS: 50.0000 mg | ORAL_TABLET | Freq: Every day | ORAL | 3 refills | Status: DC

## 2017-01-05 MED ORDER — CEFDINIR 300 MG PO CAPS: 300.0000 mg | ORAL_CAPSULE | Freq: Two times a day (BID) | ORAL | 0 refills | Status: DC

## 2017-01-05 NOTE — Progress Notes (Signed)
Subjective:     [name], female   DOB: 1965-09-10, 51 y.o.   MRN: [id number]  HPI Here for ultrasound to recheck uterine fibroid due to heavy and painful menses for the last year, see previous note from April's physical exam. Denies any changes since that visit.  Review of Systems    negative at this time  Objective:   Physical Exam A&Ox4 Well groomed female in no distress Blood pressure (!) 146/74, pulse 67, height 5\' 5"  (1.651 m), weight 194 lb 1.6 oz (88 kg), last menstrual period 12/11/2016.  Ultrasound Findings:  The uterus is anteverted and slightly enlarged measuring 11.6 x 7.5 x 8.3 cm. The posterior portion of the uterus appears larger than the anterior portion. It has a heterogenous appearance, however, no obvious well circumscribed fibroid is seen.  Echo texture is heterogenous with evidence of a focal mass. Within the uterus is a suspected fibrois measuring: Fibroid 1: 1.3 x 1.1 x 1.4 cm. This is subserosal, anterior fundus.  The Endometrium measures 15.4 mm.   Right and left ovaries were not visualized. Survey of both adnexa demonstrates no adnexal masses. There is no free fluid in the cul de sac.  Impression: 1. Enlarged fibroid uterus. However, the enlarged part appears to be posterior. The posterior portion appears heterogenous without obvious evidence of a fibroid. The posterior portion may be enlarged due to a indistinct fibroid versus other etiology such as adenomyosis. 2. The ovaries were not visualized, however, survey of both adnexa appear WNL.    Assessment:     Uterine fibroid Enlarged uterus Menorrhagia with regular menses Dysmenorrhea Obesity     Plan:     Discussed treatment options including Lupron, oral medications, and hysterectomy.  Patient desires trial of Lupron. Info given for patient to review and will schedule consult with dr Defrancesco at next opening. To continue OCPs until she sees him.   Melody [place], CNM

## 2017-01-08 ENCOUNTER — Encounter: Payer: BLUE CROSS/BLUE SHIELD | Admitting: Obstetrics and Gynecology

## 2017-01-12 ENCOUNTER — Ambulatory Visit (INDEPENDENT_AMBULATORY_CARE_PROVIDER_SITE_OTHER): Payer: BLUE CROSS/BLUE SHIELD | Admitting: Podiatry

## 2017-01-12 ENCOUNTER — Ambulatory Visit
Admission: RE | Admit: 2017-01-12 | Discharge: 2017-01-12 | Disposition: A | Discharge status: Home / Self Care | Payer: BLUE CROSS/BLUE SHIELD | Source: Ambulatory Visit | Attending: Obstetrics and Gynecology | Admitting: Obstetrics and Gynecology

## 2017-01-12 DIAGNOSIS — M7751 Other enthesopathy of right foot: Secondary | ICD-10-CM | POA: Diagnosis not present

## 2017-01-12 DIAGNOSIS — Z01411 Encounter for gynecological examination (general) (routine) with abnormal findings: Secondary | ICD-10-CM

## 2017-01-12 DIAGNOSIS — Z1231 Encounter for screening mammogram for malignant neoplasm of breast: Secondary | ICD-10-CM | POA: Diagnosis present

## 2017-01-15 NOTE — Progress Notes (Signed)
   Subjective:  Patient presents today for follow-up evaluation of pain and tenderness to the left ankle. She states she is doing better and reports improvement after the injections.  Objective / Physical Exam:  General:  The patient is alert and oriented x3 in no acute distress. Dermatology:  Skin is warm, dry and supple bilateral lower extremities. Negative for open lesions or macerations. Vascular:  Palpable pedal pulses bilaterally. No edema or erythema noted. Capillary refill within normal limits. Neurological:  Epicritic and protective threshold grossly intact bilaterally.  Musculoskeletal Exam:  Range of motion within normal limits to all pedal and ankle joints bilateral. Muscle strength 5/5 in all groups bilateral.   Assessment: #1 capsulitis first and second MPJ-right resolved   Plan of Care:  #1 Patient was evaluated. #2 recommended good shoe gear. #3 return to clinic when necessary.  Edrick Kins, DPM [facility]  Dr. Edrick Kins, [address]                                        [place], [place] [address]                Office [phone]  Fax [phone]

## 2017-01-23 ENCOUNTER — Telehealth: Payer: Self-pay | Admitting: Cardiovascular Disease

## 2017-01-23 NOTE — Telephone Encounter (Signed)
Spoke with patient and she reports that her blood pressure was very elevated and she was not sure which medication to take for that. She stated that she has flecainide and metoprolol as needed but she didn't know which one to take. Instructed her to try the metoprolol which helps with blood pressure and increase hydration. She verbalized understanding of our conversation, agreement with plan, and had no further questions at this time.

## 2017-01-23 NOTE — Telephone Encounter (Signed)
Pt calling stating today around 930 am her BP shot up She has two medications that we prescribed her but she is not sure which one to take  930 am  169/86 about 30 min 158/77  has not checked it since then, She is at work so she can't check it right now  Please advise

## 2017-02-14 ENCOUNTER — Encounter: Payer: BLUE CROSS/BLUE SHIELD | Admitting: Obstetrics and Gynecology

## 2017-02-22 ENCOUNTER — Other Ambulatory Visit: Payer: Self-pay | Admitting: *Deleted

## 2017-02-22 MED ORDER — SERTRALINE HCL 50 MG PO TABS
50.0000 mg | ORAL_TABLET | Freq: Every day | ORAL | 3 refills | Status: DC
Start: 2017-02-22 — End: 2017-08-06

## 2017-03-01 ENCOUNTER — Encounter: Payer: BLUE CROSS/BLUE SHIELD | Admitting: Obstetrics and Gynecology

## 2017-03-08 ENCOUNTER — Encounter: Payer: Self-pay | Admitting: Obstetrics and Gynecology

## 2017-03-08 ENCOUNTER — Ambulatory Visit (INDEPENDENT_AMBULATORY_CARE_PROVIDER_SITE_OTHER): Payer: BLUE CROSS/BLUE SHIELD | Admitting: Obstetrics and Gynecology

## 2017-03-08 VITALS — BP 136/77 | HR 71 | Ht 65.0 in | Wt 191.0 lb

## 2017-03-08 DIAGNOSIS — Z98891 History of uterine scar from previous surgery: Secondary | ICD-10-CM

## 2017-03-08 DIAGNOSIS — Z8759 Personal history of other complications of pregnancy, childbirth and the puerperium: Secondary | ICD-10-CM | POA: Diagnosis not present

## 2017-03-08 DIAGNOSIS — F3281 Premenstrual dysphoric disorder: Secondary | ICD-10-CM | POA: Insufficient documentation

## 2017-03-08 DIAGNOSIS — N946 Dysmenorrhea, unspecified: Secondary | ICD-10-CM

## 2017-03-08 DIAGNOSIS — Z124 Encounter for screening for malignant neoplasm of cervix: Secondary | ICD-10-CM | POA: Diagnosis not present

## 2017-03-08 DIAGNOSIS — D649 Anemia, unspecified: Secondary | ICD-10-CM | POA: Diagnosis not present

## 2017-03-08 DIAGNOSIS — D259 Leiomyoma of uterus, unspecified: Secondary | ICD-10-CM

## 2017-03-08 DIAGNOSIS — N920 Excessive and frequent menstruation with regular cycle: Secondary | ICD-10-CM

## 2017-03-08 MED ORDER — MEDROXYPROGESTERONE ACETATE 5 MG PO TABS: 5.0000 mg | ORAL_TABLET | Freq: Every day | ORAL | 6 refills | Status: DC

## 2017-03-08 NOTE — Addendum Note (Signed)
Addended by: Elouise Munroe on: 03/08/2017 08:56 AM   Modules accepted: Orders

## 2017-03-08 NOTE — Progress Notes (Signed)
Chief complaint: 1. Fibroid uterus 2. Menorrhagia 3. Severe dysmenorrhea 4. Anemia  Patient is a 51 year old female para 2 12 2, with history of enlarged uterus consistent with fibroids and possible adenomyosis, with long history of heavy menses and painful menses, presents for initiation of Depo-Lupron therapy. Patient would like hysterectomy but cannot take time off from work to have convalescence at this time.  Menorrhagia lasts 5-7 days and is heavy with clots. She is on iron for iron deficiency anemia.  Dysmenorrhea is severe; it starts as left-sided and low back ache with radiation into the buttocks and down into the legs. The patient reports taking up to 12 Advil a day for her pain as well as using a heating pad She does not  have dyspareunia.  OBJECTIVE: BP 136/77   Pulse 71   Ht 5\' 5"  (1.651 m)   Wt 191 lb (86.6 kg)   LMP 02/26/2017 (Exact Date)   BMI 31.78 kg/m  Pleasant well-appearing female in no acute distress Back: No CVA tenderness Abdomen: Soft, nontender without organomegaly. No peritoneal signs Pelvic exam: Anthropoid pelvis External genitalia-normal BUS normal Vagina-normal Cervix-parous; 2/4 cervical motion tenderness; cervix is situated high within the vaginal vault Uterus-midplane to retroverted, enlarged to 12 weeks size, 2/4 tender, mobile Adnexa-nonpalpable; nontender Rectovaginal-normal external exam  ASSESSMENT: 1. Symptomatic fibroid uterus 2. Suspected endometriosis/adenomyosis 3. Patient desires start of Lupron therapy 4. Anemia, on iron supplementation  PLAN: 1. Medical and surgical management options for fibroids and inspected endometriosis were discussed 2. Start Depo-Lupron trial 3.75 mg IM every month times months 3. Recommend add back therapy Provera 5 mg daily 4. Return in 3 months for repeat exam and assessment of symptomatology 5. Patient will likely need TAH LSO for definitive surgical treatment if symptoms persist beyond Lupron  trial  A total of 15 minutes were spent face-to-face with the patient during this encounter and over half of that time dealt with counseling and coordination of care.  Brayton Mars, MD  Note: This dictation was prepared with Dragon dictation along with smaller phrase technology. Any transcriptional errors that result from this process are unintentional.

## 2017-03-08 NOTE — Patient Instructions (Addendum)
1. Depo-Lupron therapy 3.75 mg monthly intramuscular will be started for symptomatic fibroids and speculum endometriosis 2. Provera 5 mg orally daily will be given for add back therapy 3. Patient will return in 3 months for follow-up 4. Patient will be contacted when medication is preauthorized and available for first injection. The injections will be monthly for 6-12 months.  Leuprolide depot injection What is this medicine? LEUPROLIDE (loo PROE lide) is a man-made protein that acts like a natural hormone in the body. It decreases testosterone in men and decreases estrogen in women. In men, this medicine is used to treat advanced prostate cancer. In women, some forms of this medicine may be used to treat endometriosis, uterine fibroids, or other female hormone-related problems. This medicine may be used for other purposes; ask your health care provider or pharmacist if you have questions. COMMON BRAND NAME(S): Eligard, Lupron Depot, Lupron Depot-Ped, Viadur What should I tell my health care provider before I take this medicine? They need to know if you have any of these conditions: -diabetes -heart disease or previous heart attack -high blood pressure -high cholesterol -mental illness -osteoporosis -pain or difficulty passing urine -seizures -spinal cord metastasis -stroke -suicidal thoughts, plans, or attempt; a previous suicide attempt by you or a family member -tobacco smoker -unusual vaginal bleeding (women) -an unusual or allergic reaction to leuprolide, benzyl alcohol, other medicines, foods, dyes, or preservatives -pregnant or trying to get pregnant -breast-feeding How should I use this medicine? This medicine is for injection into a muscle or for injection under the skin. It is given by a health care professional in a hospital or clinic setting. The specific product will determine how it will be given to you. Make sure you understand which product you receive and how often you  will receive it. Talk to your pediatrician regarding the use of this medicine in children. Special care may be needed. Overdosage: If you think you have taken too much of this medicine contact a poison control center or emergency room at once. NOTE: This medicine is only for you. Do not share this medicine with others. What if I miss a dose? It is important not to miss a dose. Call your doctor or health care professional if you are unable to keep an appointment. Depot injections: Depot injections are given either once-monthly, every 12 weeks, every 16 weeks, or every 24 weeks depending on the product you are prescribed. The product you are prescribed will be based on if you are female or female, and your condition. Make sure you understand your product and dosing. What may interact with this medicine? Do not take this medicine with any of the following medications: -chasteberry This medicine may also interact with the following medications: -herbal or dietary supplements, like black cohosh or DHEA -female hormones, like estrogens or progestins and birth control pills, patches, rings, or injections -female hormones, like testosterone This list may not describe all possible interactions. Give your health care provider a list of all the medicines, herbs, non-prescription drugs, or dietary supplements you use. Also tell them if you smoke, drink alcohol, or use illegal drugs. Some items may interact with your medicine. What should I watch for while using this medicine? Visit your doctor or health care professional for regular checks on your progress. During the first weeks of treatment, your symptoms may get worse, but then will improve as you continue your treatment. You may get hot flashes, increased bone pain, increased difficulty passing urine, or an aggravation of nerve  symptoms. Discuss these effects with your doctor or health care professional, some of them may improve with continued use of this  medicine. Female patients may experience a menstrual cycle or spotting during the first months of therapy with this medicine. If this continues, contact your doctor or health care professional. What side effects may I notice from receiving this medicine? Side effects that you should report to your doctor or health care professional as soon as possible: -allergic reactions like skin rash, itching or hives, swelling of the face, lips, or tongue -breathing problems -chest pain -depression or memory disorders -pain in your legs or groin -pain at site where injected or implanted -seizures -severe headache -swelling of the feet and legs -suicidal thoughts or other mood changes -visual changes -vomiting Side effects that usually do not require medical attention (report to your doctor or health care professional if they continue or are bothersome): -breast swelling or tenderness -decrease in sex drive or performance -diarrhea -hot flashes -loss of appetite -muscle, joint, or bone pains -nausea -redness or irritation at site where injected or implanted -skin problems or acne This list may not describe all possible side effects. Call your doctor for medical advice about side effects. You may report side effects to FDA at 1-800-FDA-1088. Where should I keep my medicine? This drug is given in a hospital or clinic and will not be stored at home. NOTE: This sheet is a summary. It may not cover all possible information. If you have questions about this medicine, talk to your doctor, pharmacist, or health care provider.  2018 Elsevier/Gold Standard (2016-01-20 09:45:53)

## 2017-03-14 LAB — PAP IG AND HPV HIGH-RISK
HPV, HIGH-RISK: NEGATIVE
PAP Smear Comment: 0

## 2017-03-26 ENCOUNTER — Telehealth: Payer: Self-pay | Admitting: Obstetrics and Gynecology

## 2017-03-26 NOTE — Telephone Encounter (Signed)
Patient lvm wanting to speak with "Dr. Duard Brady Nurse" Joyice Faster in regards to her Lupron injection being ordered. Patient stated that she was informed that it would take up to 2-3 weeks and it has been longer. The patient did not disclose any other information other than wanting a call back to discuss the issue. Please advise.

## 2017-03-26 NOTE — Telephone Encounter (Signed)
Pt aware I had to resend insurance card to [place] today. Faxed x 2 with confirmation. Pt aware I will reach out asap. Apologized for the delay. Advised of pap results.

## 2017-05-07 ENCOUNTER — Telehealth: Payer: Self-pay | Admitting: Obstetrics and Gynecology

## 2017-05-07 NOTE — Telephone Encounter (Signed)
Pt left voicemail requesting Crystal Dr. Buckner [place] nurse to return her call. Please advise. Thanks TNP

## 2017-05-08 NOTE — Telephone Encounter (Signed)
Pt aware [facility] needs a PA. They will send it. Will contact pt when complete.

## 2017-05-09 NOTE — Telephone Encounter (Signed)
Spoke with [facility] and [facility]. They have sent me a pre certification. Faxed with confirmation. [place] at [place] will be taking over the case. Will reach out on Friday for an update.  Number to [phone]                                   Option 1  Pt aware. Will reach out with an update on Friday.

## 2017-05-14 NOTE — Telephone Encounter (Signed)
Pt aware per Dina Rich- she needs to reach out to [employer] to get an approval number and effective dates to give to [facility] before they will ship the lupron. Pt will reach out to [facility] and let me know.

## 2017-05-16 NOTE — Telephone Encounter (Signed)
Pt aware pa completed. Awaiting results.

## 2017-05-24 NOTE — Telephone Encounter (Signed)
Pt aware lupron was approved on 05/17/2017 for 1 year. [facility] and ask [facility] to [facility] to [facility]. Spoke with Oley Balm she states the rx will be transferred today. Pt is aware.

## 2017-05-28 ENCOUNTER — Telehealth: Payer: Self-pay | Admitting: Obstetrics and Gynecology

## 2017-05-28 NOTE — Telephone Encounter (Signed)
Patient called and/ stated that she would like to speak with Joyice Faster in regards to the pre-authorization of a shot she is getting in the near future. No other information was disclosed other than wanting to receive a call back. Please advise.

## 2017-05-29 NOTE — Telephone Encounter (Signed)
Pt states [facility] contacted her but she thought it should [facility]. Explained to pt that [facility] approved her rx for 1year not [facility]. She will call [facility] back and pay for lupron.

## 2017-05-30 ENCOUNTER — Telehealth: Payer: Self-pay | Admitting: Obstetrics and Gynecology

## 2017-05-30 NOTE — Telephone Encounter (Signed)
Patient called for a return call   [place] also called for additional information concerning the pre cert for an injection  - you can either call pre cert dept @ [phone] or fax the information to [phone]

## 2017-06-04 NOTE — Telephone Encounter (Signed)
LM for nurse at [facility] to return my call.

## 2017-06-05 NOTE — Telephone Encounter (Signed)
Pt aware awaiting on call from [place] to give initial info.

## 2017-06-06 ENCOUNTER — Telehealth: Payer: Self-pay | Admitting: Obstetrics and Gynecology

## 2017-06-06 NOTE — Telephone Encounter (Signed)
Lm for pt to contact office and reschedule appt. She is unable to be her on 10/23. Reminded her to bring her lupron with her.

## 2017-06-06 NOTE — Telephone Encounter (Signed)
lmtrc

## 2017-06-06 NOTE — Telephone Encounter (Signed)
Spoke with pt - she has her lupron. Advised her to contact office for an appt.

## 2017-06-06 NOTE — Telephone Encounter (Signed)
Patient states that she has her shot   Please call

## 2017-06-08 ENCOUNTER — Other Ambulatory Visit: Payer: Self-pay | Admitting: Obstetrics and Gynecology

## 2017-06-12 ENCOUNTER — Encounter: Payer: BLUE CROSS/BLUE SHIELD | Admitting: Obstetrics and Gynecology

## 2017-06-13 ENCOUNTER — Encounter: Payer: Self-pay | Admitting: Obstetrics and Gynecology

## 2017-06-13 ENCOUNTER — Ambulatory Visit (INDEPENDENT_AMBULATORY_CARE_PROVIDER_SITE_OTHER): Payer: BLUE CROSS/BLUE SHIELD | Admitting: Obstetrics and Gynecology

## 2017-06-13 VITALS — BP 152/72 | HR 67 | Ht 65.0 in | Wt 193.1 lb

## 2017-06-13 DIAGNOSIS — Z98891 History of uterine scar from previous surgery: Secondary | ICD-10-CM

## 2017-06-13 DIAGNOSIS — N946 Dysmenorrhea, unspecified: Secondary | ICD-10-CM

## 2017-06-13 DIAGNOSIS — D259 Leiomyoma of uterus, unspecified: Secondary | ICD-10-CM

## 2017-06-13 MED ORDER — LEUPROLIDE ACETATE 3.75 MG IM KIT
3.7500 mg | PACK | Freq: Once | INTRAMUSCULAR | Status: AC
  Administered 2017-06-13: 3.75 mg via INTRAMUSCULAR

## 2017-06-13 MED ORDER — MEDROXYPROGESTERONE ACETATE 5 MG PO TABS: 5.0000 mg | ORAL_TABLET | Freq: Every day | ORAL | 1 refills | Status: DC

## 2017-06-13 NOTE — Progress Notes (Signed)
Chief complaint: 1.  Fibroid uterus 2.  Severe dysmenorrhea  Patient presents today for first Depo-Lupron injection.  She was scheduled to have the start 3 months ago but due to insurance issues, the initiation of therapy was delayed.  She continues to have severe dysmenorrhea, dyspareunia, and pelvic pain.  Uterus is approximately 12 weeks size.  She is status post cesarean section x2.  Patient is on iron for iron deficiency anemia and requests refill of medication today. Patient is willing to go on add back therapy for Depo-Lupron injections.  Past medical history, past surgical history, problem list, medications, and allergies are reviewed  OBJECTIVE: BP (!) 152/72   Pulse 67   Ht 5\' 5"  (1.651 m)   Wt 193 lb 1.6 oz (87.6 kg)   LMP 06/11/2017 (Exact Date)   BMI 32.13 kg/m  Pleasant well-appearing female in no acute distress.  Alert and oriented. Pelvic exam-deferred  ASSESSMENT: 1.  Fibroid uterus-12 weeks size 2.  History of cesarean section x2 3.  Severe dysmenorrhea and chronic pelvic pain 4.  Starting today-Depo-Lupron therapy  PLAN: 1.  Depo-Lupron 3.75 mg IM monthly x6 2.  Provera 5 mg daily at back therapy 3.  Iron prescription is refilled 4.  Return in 4 weeks for next injection of Depo-Lupron 5.  Return in 3 months for physical exam follow-up 6.  Multiple questions regarding therapy were addressed. 7.  Calcium supplementation and weightbearing exercise are encouraged  A total of 15 minutes were spent face-to-face with the patient during this encounter and over half of that time dealt with counseling and coordination of care.  Brayton Mars, MD  Note: This dictation was prepared with Dragon dictation along with smaller phrase technology. Any transcriptional errors that result from this process are unintentional.

## 2017-06-13 NOTE — Patient Instructions (Signed)
1.  Depo-Lupron injections start today-3.75 mg IM monthly for 6 months 2.  Return in 1 month for next injection 3.  Return for follow-up exam in 3 months  Leuprolide depot injection What is this medicine? LEUPROLIDE (loo PROE lide) is a man-made protein that acts like a natural hormone in the body. It decreases testosterone in men and decreases estrogen in women. In men, this medicine is used to treat advanced prostate cancer. In women, some forms of this medicine may be used to treat endometriosis, uterine fibroids, or other female hormone-related problems. This medicine may be used for other purposes; ask your health care provider or pharmacist if you have questions. COMMON BRAND NAME(S): Eligard, Lupron Depot, Lupron Depot-Ped, Viadur What should I tell my health care provider before I take this medicine? They need to know if you have any of these conditions: -diabetes -heart disease or previous heart attack -high blood pressure -high cholesterol -mental illness -osteoporosis -pain or difficulty passing urine -seizures -spinal cord metastasis -stroke -suicidal thoughts, plans, or attempt; a previous suicide attempt by you or a family member -tobacco smoker -unusual vaginal bleeding (women) -an unusual or allergic reaction to leuprolide, benzyl alcohol, other medicines, foods, dyes, or preservatives -pregnant or trying to get pregnant -breast-feeding How should I use this medicine? This medicine is for injection into a muscle or for injection under the skin. It is given by a health care professional in a hospital or clinic setting. The specific product will determine how it will be given to you. Make sure you understand which product you receive and how often you will receive it. Talk to your pediatrician regarding the use of this medicine in children. Special care may be needed. Overdosage: If you think you have taken too much of this medicine contact a poison control center or  emergency room at once. NOTE: This medicine is only for you. Do not share this medicine with others. What if I miss a dose? It is important not to miss a dose. Call your doctor or health care professional if you are unable to keep an appointment. Depot injections: Depot injections are given either once-monthly, every 12 weeks, every 16 weeks, or every 24 weeks depending on the product you are prescribed. The product you are prescribed will be based on if you are female or female, and your condition. Make sure you understand your product and dosing. What may interact with this medicine? Do not take this medicine with any of the following medications: -chasteberry This medicine may also interact with the following medications: -herbal or dietary supplements, like black cohosh or DHEA -female hormones, like estrogens or progestins and birth control pills, patches, rings, or injections -female hormones, like testosterone This list may not describe all possible interactions. Give your health care provider a list of all the medicines, herbs, non-prescription drugs, or dietary supplements you use. Also tell them if you smoke, drink alcohol, or use illegal drugs. Some items may interact with your medicine. What should I watch for while using this medicine? Visit your doctor or health care professional for regular checks on your progress. During the first weeks of treatment, your symptoms may get worse, but then will improve as you continue your treatment. You may get hot flashes, increased bone pain, increased difficulty passing urine, or an aggravation of nerve symptoms. Discuss these effects with your doctor or health care professional, some of them may improve with continued use of this medicine. Female patients may experience a menstrual cycle or  spotting during the first months of therapy with this medicine. If this continues, contact your doctor or health care professional. What side effects may I notice  from receiving this medicine? Side effects that you should report to your doctor or health care professional as soon as possible: -allergic reactions like skin rash, itching or hives, swelling of the face, lips, or tongue -breathing problems -chest pain -depression or memory disorders -pain in your legs or groin -pain at site where injected or implanted -seizures -severe headache -swelling of the feet and legs -suicidal thoughts or other mood changes -visual changes -vomiting Side effects that usually do not require medical attention (report to your doctor or health care professional if they continue or are bothersome): -breast swelling or tenderness -decrease in sex drive or performance -diarrhea -hot flashes -loss of appetite -muscle, joint, or bone pains -nausea -redness or irritation at site where injected or implanted -skin problems or acne This list may not describe all possible side effects. Call your doctor for medical advice about side effects. You may report side effects to FDA at 1-800-FDA-1088. Where should I keep my medicine? This drug is given in a hospital or clinic and will not be stored at home. NOTE: This sheet is a summary. It may not cover all possible information. If you have questions about this medicine, talk to your doctor, pharmacist, or health care provider.  2018 Elsevier/Gold Standard (2016-01-20 09:45:53)

## 2017-06-26 DIAGNOSIS — F329 Major depressive disorder, single episode, unspecified: Secondary | ICD-10-CM | POA: Insufficient documentation

## 2017-06-26 DIAGNOSIS — F32A Depression, unspecified: Secondary | ICD-10-CM | POA: Insufficient documentation

## 2017-07-19 ENCOUNTER — Telehealth: Payer: Self-pay | Admitting: Obstetrics and Gynecology

## 2017-07-19 NOTE — Telephone Encounter (Signed)
The patient called and stated that she would like to speak with Joyice Faster in regards to her injections that the patient is getting monthly. No other information was disclosed. Please advise.

## 2017-07-20 NOTE — Telephone Encounter (Signed)
Pt states she has contacted the pharmacy about her lupron. It is suppose to be sent to her. Once she has it she will contact the office for the 2nd inj.

## 2017-08-06 ENCOUNTER — Ambulatory Visit (INDEPENDENT_AMBULATORY_CARE_PROVIDER_SITE_OTHER): Payer: BLUE CROSS/BLUE SHIELD | Admitting: Obstetrics and Gynecology

## 2017-08-06 ENCOUNTER — Encounter: Payer: Self-pay | Admitting: Obstetrics and Gynecology

## 2017-08-06 VITALS — BP 146/81 | HR 78 | Ht 65.0 in | Wt 198.4 lb

## 2017-08-06 DIAGNOSIS — N946 Dysmenorrhea, unspecified: Secondary | ICD-10-CM | POA: Diagnosis not present

## 2017-08-06 DIAGNOSIS — D259 Leiomyoma of uterus, unspecified: Secondary | ICD-10-CM

## 2017-08-06 DIAGNOSIS — Z79899 Other long term (current) drug therapy: Secondary | ICD-10-CM

## 2017-08-06 MED ORDER — LEUPROLIDE ACETATE 3.75 MG IM KIT
3.7500 mg | PACK | Freq: Once | INTRAMUSCULAR | Status: AC
  Administered 2017-08-06: 3.75 mg via INTRAMUSCULAR

## 2017-08-06 NOTE — Progress Notes (Signed)
Pt presents for #2 lupron inj. Pt is 4 weeks over due for lupron. Issues with delivery and purchase of lupron. NO s/e noted. Pt states she does feel like she is going to start her cycle. NO cycle sine 06/13/2017.  Encouraged pt to contact pharmacy and order #3 lupron inj asap. Follow up with mad and lupron 4 weeks.   I have reviewed the record and concur with patient management and plan. DEFRANCESCO, Hassell Done, MD, Cherlynn June

## 2017-08-28 ENCOUNTER — Telehealth: Payer: Self-pay | Admitting: *Deleted

## 2017-08-28 NOTE — Telephone Encounter (Signed)
Patient called and states her insurance changed and she get a Lupron injection. Patient states you can call to get updated information to order that injection. Patient # [phone].

## 2017-08-29 NOTE — Telephone Encounter (Signed)
Advised pt to contact [facility] and give the new insurance info. Pt will call me back.

## 2017-08-31 NOTE — Telephone Encounter (Signed)
Pt has called [facility]. She is waiting on a response. Also gave her the number to [facility]. Pt to call me back asap.

## 2017-09-03 ENCOUNTER — Other Ambulatory Visit: Payer: Self-pay | Admitting: Obstetrics and Gynecology

## 2017-09-04 ENCOUNTER — Telehealth: Payer: Self-pay | Admitting: Obstetrics and Gynecology

## 2017-09-04 NOTE — Telephone Encounter (Signed)
The patient called an stated that she would like to speak with Crystal in regards to her injection. The patient did not disclose any other information other than wanting a call back. Please advise.

## 2017-09-05 NOTE — Telephone Encounter (Signed)
Pt states her new insurance is requiring her to send the lupron to a new pharmacy. Pt does not have her insurance card with her at this time. Advised her to send me a my chart message with new pharmacy name,phone number, and fax. Also to send me her new insurance id number and group number. Pt voices understanding.

## 2017-09-06 ENCOUNTER — Encounter: Payer: Self-pay | Admitting: Obstetrics and Gynecology

## 2017-09-06 ENCOUNTER — Other Ambulatory Visit: Payer: Self-pay

## 2017-09-06 MED ORDER — LEUPROLIDE ACETATE 3.75 MG IM KIT: 3.7500 mg | PACK | Freq: Once | INTRAMUSCULAR | 4 refills | Status: AC

## 2017-09-17 ENCOUNTER — Ambulatory Visit (INDEPENDENT_AMBULATORY_CARE_PROVIDER_SITE_OTHER): Payer: Managed Care, Other (non HMO) | Admitting: Obstetrics and Gynecology

## 2017-09-17 ENCOUNTER — Encounter: Payer: Self-pay | Admitting: Obstetrics and Gynecology

## 2017-09-17 VITALS — BP 148/77 | HR 80 | Ht 65.0 in | Wt 203.0 lb

## 2017-09-17 DIAGNOSIS — D259 Leiomyoma of uterus, unspecified: Secondary | ICD-10-CM

## 2017-09-17 DIAGNOSIS — Z79899 Other long term (current) drug therapy: Secondary | ICD-10-CM

## 2017-09-17 DIAGNOSIS — N920 Excessive and frequent menstruation with regular cycle: Secondary | ICD-10-CM

## 2017-09-17 MED ORDER — LEUPROLIDE ACETATE 3.75 MG IM KIT
3.7500 mg | PACK | Freq: Once | INTRAMUSCULAR | Status: AC
  Administered 2017-09-17: 3.75 mg via INTRAMUSCULAR

## 2017-09-17 NOTE — Progress Notes (Signed)
Pt presents for lupron #3.  Pt is 10 days late d/t insurance changes. Cost of lupron increased to 100.00. Pt is considering proceeding with tah/lso.  Pt states 2 weeks ago she had a horrible cycle. Advised her to f/u with mad in 4 weeks for #4 lupron and discuss surgery.   I have reviewed the record and concur with patient management and plan. DEFRANCESCO, Hassell Done, MD, Cherlynn June

## 2017-10-11 ENCOUNTER — Encounter: Payer: Self-pay | Admitting: Obstetrics and Gynecology

## 2017-10-23 ENCOUNTER — Other Ambulatory Visit: Payer: Self-pay

## 2017-10-23 MED ORDER — MEDROXYPROGESTERONE ACETATE 5 MG PO TABS: 5.0000 mg | ORAL_TABLET | Freq: Every day | ORAL | 1 refills | Status: DC

## 2017-11-14 ENCOUNTER — Encounter: Payer: Managed Care, Other (non HMO) | Admitting: Obstetrics and Gynecology

## 2017-11-19 ENCOUNTER — Other Ambulatory Visit: Payer: Self-pay

## 2017-11-19 ENCOUNTER — Other Ambulatory Visit: Payer: Managed Care, Other (non HMO)

## 2017-11-19 ENCOUNTER — Encounter: Payer: Self-pay | Admitting: Obstetrics and Gynecology

## 2017-11-19 DIAGNOSIS — Z202 Contact with and (suspected) exposure to infections with a predominantly sexual mode of transmission: Secondary | ICD-10-CM

## 2017-11-20 LAB — HEPATITIS C ANTIBODY: Hep C Virus Ab: <0.1 s/co ratio (ref 0.0–0.9)

## 2017-11-20 LAB — HSV(HERPES SIMPLEX VRS) I + II AB-IGG
HSV 1 Glycoprotein G Ab, IgG: 1.35 index — ABNORMAL HIGH (ref 0.00–0.90)
HSV 2 IGG, TYPE SPEC: 6.39 {index} — AB (ref 0.00–0.90)

## 2017-11-20 LAB — HEPATITIS B SURFACE ANTIGEN: Hepatitis B Surface Ag: NEGATIVE

## 2017-11-20 LAB — HIV ANTIBODY (ROUTINE TESTING W REFLEX): HIV Screen 4th Generation wRfx: NONREACTIVE

## 2017-11-20 LAB — RPR: RPR: NONREACTIVE

## 2017-11-21 ENCOUNTER — Encounter: Payer: Self-pay | Admitting: Obstetrics and Gynecology

## 2017-11-21 ENCOUNTER — Ambulatory Visit: Payer: Managed Care, Other (non HMO) | Admitting: Obstetrics and Gynecology

## 2017-11-21 VITALS — BP 153/84 | HR 76 | Ht 64.5 in | Wt 197.6 lb

## 2017-11-21 DIAGNOSIS — Z98891 History of uterine scar from previous surgery: Secondary | ICD-10-CM

## 2017-11-21 DIAGNOSIS — D259 Leiomyoma of uterus, unspecified: Secondary | ICD-10-CM | POA: Diagnosis not present

## 2017-11-21 DIAGNOSIS — N946 Dysmenorrhea, unspecified: Secondary | ICD-10-CM

## 2017-11-21 LAB — GC/CHLAMYDIA PROBE AMP
Chlamydia trachomatis, NAA: NEGATIVE
Neisseria gonorrhoeae by PCR: NEGATIVE

## 2017-11-21 MED ORDER — FUSION PLUS PO CAPS: 1.0000 | ORAL_CAPSULE | ORAL | 6 refills | Status: DC

## 2017-11-21 MED ORDER — FUSION PLUS PO CAPS: 1.0000 | ORAL_CAPSULE | Freq: Every day | ORAL | 1 refills | Status: AC

## 2017-11-21 NOTE — Patient Instructions (Addendum)
1.  Continue with Provera 5 mg daily 2.  Depo-Lupron injections are discontinued 3.  Maintain menstrual calendar monitoring for abnormal uterine bleeding 4.  Return in 3 months for follow-up 5.  Fusion plus prescription is called in

## 2017-11-21 NOTE — Addendum Note (Signed)
Addended by: Elouise Munroe on: 11/21/2017 01:36 PM   Modules accepted: Orders

## 2017-11-21 NOTE — Progress Notes (Signed)
Chief complaint: 1.  Dysmenorrhea 2.  Uterine fibroids 3.  Abnormal uterine bleeding  [name] [name] presents today for follow-up.  She had been placed on Depo-Lupron injections for dysmenorrhea/pelvic pain/abnormal uterine bleeding management due to a 12-week fibroid uterus.  She has taken for shots of Depo-Lupron which did not seem to impact her abnormal uterine bleeding and pelvic pain.  She has been taking add back therapy.  Since January she has not had any Lupron injections and she continues to take the Provera.  She has not had any further abnormal uterine bleeding or onset of pelvic pain. Patient is not reporting any significant vasomotor symptoms. Patient is contemplating hysterectomy in the form of TAH LSO, but at this time she is not having clinical symptoms  Past medical history, past surgical history, problem list, medications, and allergies are reviewed  Patient was previously given samples of fusion plus-iron plus probiotics.  She is interested in a prescription for this medication  OBJECTIVE: BP (!) 153/84   Pulse 76   Ht 5' 4.5" (1.638 m)   Wt 197 lb 9.6 oz (89.6 kg)   LMP 10/01/2017 (Approximate)   BMI 33.39 kg/m  Pleasant well-appearing female in no acute distress.  Alert and oriented.  Patient is well-groomed. Abdomen: Soft, nontender without organomegaly; Pfannenstiel incisions and lower abdomen are well-healed Pelvic: External genitalia-normal BUS-normal Vagina-snug introitus; normal estrogen effect Bimanual-cervix situated high within the vagina; no cervical motion tenderness Uterus 1/4 tender; midplane, mobile Adnexa-nonpalpable nontender Rectovaginal-normal external exam  ASSESSMENT: 1.  History of 12-week fibroid uterus, symptomatic 2.  Trial of Depo-Lupron, ineffective due to recurrent abnormal uterine bleeding and pelvic pain, therapy discontinued 3.  Amenorrheic on Provera 5 mg daily (previously prescribed for add back therapy) 4.  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  Implications discussed.  Patient has never been symptomatic.  PLAN: 1.  Continue Provera 5 mg daily 2.  Monitor for any abnormal uterine bleeding with menstrual calendar 3.  Return in 3 months for follow-up. 4.  If no further bleeding, may continue ovarian therapy up to age 77, followed by discontinuation 5.  If abnormal uterine bleeding recurs and pelvic pain recurs, consider hysterectomy-TAH LSO 6.  Fusion plus prescription is given per patient request (probiotic + iron)  A total of 25 minutes were spent face-to-face with the patient during this encounter and over half of that time involved counseling and coordination of care.  Brayton Mars, MD  Note: This dictation was prepared with Dragon dictation along with smaller phrase technology. Any transcriptional errors that result from this process are unintentional.

## 2017-11-27 ENCOUNTER — Ambulatory Visit: Payer: Managed Care, Other (non HMO) | Admitting: Obstetrics and Gynecology

## 2017-11-27 ENCOUNTER — Encounter: Payer: Self-pay | Admitting: Obstetrics and Gynecology

## 2017-11-27 VITALS — BP 150/77 | HR 80 | Ht 64.5 in | Wt 196.8 lb

## 2017-11-27 DIAGNOSIS — B009 Herpesviral infection, unspecified: Secondary | ICD-10-CM | POA: Diagnosis not present

## 2017-11-27 MED ORDER — VALACYCLOVIR HCL 1 G PO TABS: 1000.0000 mg | ORAL_TABLET | Freq: Two times a day (BID) | ORAL | 3 refills | Status: DC

## 2017-11-27 NOTE — Patient Instructions (Addendum)
1.  Valtrex 1000 mg twice a day for 10 days 2.  Continue using Desitin as needed for minimizing symptoms 3.  If symptom recurrence is greater than 4 times a year, chronic suppressive therapy may be recommended  Valacyclovir caplets What is this medicine? VALACYCLOVIR (val ay SYE kloe veer) is an antiviral medicine. It is used to treat or prevent infections caused by certain kinds of viruses. Examples of these infections include herpes and shingles. This medicine will not cure herpes. This medicine may be used for other purposes; ask your health care provider or pharmacist if you have questions. COMMON BRAND NAME(S): Valtrex What should I tell my health care provider before I take this medicine? They need to know if you have any of these conditions: -acquired immunodeficiency syndrome (AIDS) -any other condition that may weaken the immune system -bone marrow or kidney transplant -kidney disease -an unusual or allergic reaction to valacyclovir, acyclovir, ganciclovir, valganciclovir, other medicines, foods, dyes, or preservatives -pregnant or trying to get pregnant -breast-feeding How should I use this medicine? Take this medicine by mouth with a glass of water. Follow the directions on the prescription label. You can take this medicine with or without food. Take your doses at regular intervals. Do not take your medicine more often than directed. Finish the full course prescribed by your doctor or health care professional even if you think your condition is better. Do not stop taking except on the advice of your doctor or health care professional. Talk to your pediatrician regarding the use of this medicine in children. While this drug may be prescribed for children as young as 2 years for selected conditions, precautions do apply. Overdosage: If you think you have taken too much of this medicine contact a poison control center or emergency room at once. NOTE: This medicine is only for you. Do  not share this medicine with others. What if I miss a dose? If you miss a dose, take it as soon as you can. If it is almost time for your next dose, take only that dose. Do not take double or extra doses. What may interact with this medicine? -cimetidine -probenecid This list may not describe all possible interactions. Give your health care provider a list of all the medicines, herbs, non-prescription drugs, or dietary supplements you use. Also tell them if you smoke, drink alcohol, or use illegal drugs. Some items may interact with your medicine. What should I watch for while using this medicine? Tell your doctor or health care professional if your symptoms do not start to get better after 1 week. This medicine works best when taken early in the course of an infection, within the first 81 hours. Begin treatment as soon as possible after the first signs of infection like tingling, itching, or pain in the affected area. It is possible that genital herpes may still be spread even when you are not having symptoms. Always use safer sex practices like condoms made of latex or polyurethane whenever you have sexual contact. You should stay well hydrated while taking this medicine. Drink plenty of fluids. What side effects may I notice from receiving this medicine? Side effects that you should report to your doctor or health care professional as soon as possible: -allergic reactions like skin rash, itching or hives, swelling of the face, lips, or tongue -aggressive behavior -confusion -hallucinations -problems with balance, talking, walking -stomach pain -tremor -trouble passing urine or change in the amount of urine Side effects that usually do not  require medical attention (report to your doctor or health care professional if they continue or are bothersome): -dizziness -headache -nausea, vomiting This list may not describe all possible side effects. Call your doctor for medical advice about side  effects. You may report side effects to FDA at 1-800-FDA-1088. Where should I keep my medicine? Keep out of the reach of children. Store at room temperature between 15 and 25 degrees C (59 and 77 degrees F). Keep container tightly closed. Throw away any unused medicine after the expiration date. NOTE: This sheet is a summary. It may not cover all possible information. If you have questions about this medicine, talk to your doctor, pharmacist, or health care provider.  2018 Elsevier/Gold Standard (2012-07-23 16:34:05)  Genital Herpes Genital herpes is a common sexually transmitted infection (STI) that is caused by a virus. The virus spreads from person to person through sexual contact. Infection can cause itching, blisters, and sores around the genitals or rectum. Symptoms may last several days and then go away This is called an outbreak. However, the virus remains in your body, so you may have more outbreaks in the future. The time between outbreaks varies and can be months or years. Genital herpes affects men and women. It is particularly concerning for pregnant women because the virus can be passed to the baby during delivery and can cause serious problems. Genital herpes is also a concern for people who have a weak disease-fighting (immune) system. What are the causes? This condition is caused by the herpes simplex virus (HSV) type 1 or type 2. The virus may spread through:  Sexual contact with an infected person, including vaginal, anal, and oral sex.  Contact with fluid from a herpes sore.  The skin. This means that you can get herpes from an infected partner even if he or she does not have a visible sore or does not know that he or she is infected.  What increases the risk? You are more likely to develop this condition if:  You have sex with many partners.  You do not use latex condoms during sex.  What are the signs or symptoms? Most people do not have symptoms (asymptomatic) or  have mild symptoms that may be mistaken for other skin problems. Symptoms may include:  Small red bumps near the genitals, rectum, or mouth. These bumps turn into blisters and then turn into sores.  Flu-like symptoms, including: ? Fever. ? Body aches. ? Swollen lymph nodes. ? Headache.  Painful urination.  Pain and itching in the genital area or rectal area.  Vaginal discharge.  Tingling or shooting pain in the legs and buttocks.  Generally, symptoms are more severe and last longer during the first (primary) outbreak. Flu-like symptoms are also more common during the primary outbreak. How is this diagnosed? Genital herpes may be diagnosed based on:  A physical exam.  Your medical history.  Blood tests.  A test of a fluid sample (culture) from an open sore.  How is this treated? There is no cure for this condition, but treatment with antiviral medicines that are taken by mouth (orally) can do the following:  Speed up healing and relieve symptoms.  Help to reduce the spread of the virus to sexual partners.  Limit the chance of future outbreaks, or make future outbreaks shorter.  Lessen symptoms of future outbreaks.  Your health care provider may also recommend pain relief medicines, such as aspirin or ibuprofen. Follow these instructions at home: Sexual activity  Do not  have sexual contact during active outbreaks.  Practice safe sex. Latex condoms and female condoms may help prevent the spread of the herpes virus. General instructions  Keep the affected areas dry and clean.  Take over-the-counter and prescription medicines only as told by your health care provider.  Avoid rubbing or touching blisters and sores. If you do touch blisters or sores: ? Wash your hands thoroughly with soap and water. ? Do not touch your eyes afterward.  To help relieve pain or itching, you may take the following actions as directed by your health care provider: ? Apply a cold, wet  cloth (cold compress) to affected areas 4-6 times a day. ? Apply a substance that protects your skin and reduces bleeding (astringent). ? Apply a gel that helps relieve pain around sores (lidocaine gel). ? Take a warm, shallow bath that cleans the genital area (sitz bath).  Keep all follow-up visits as told by your health care provider. This is important. How is this prevented?  Use condoms. Although anyone can get genital herpes during sexual contact, even with the use of a condom, a condom can provide some protection.  Avoid having multiple sexual partners.  Talk with your sexual partner about any symptoms either of you may have. Also, talk with your partner about any history of STIs.  Get tested for STIs before you have sex. Ask your partner to do the same.  Do not have sexual contact if you have symptoms of genital herpes. Contact a health care provider if:  Your symptoms are not improving with medicine.  Your symptoms return.  You have new symptoms.  You have a fever.  You have abdominal pain.  You have redness, swelling, or pain in your eye.  You notice new sores on other parts of your body.  You are a woman and experience bleeding between menstrual periods.  You have had herpes and you become pregnant or plan to become pregnant. Summary  Genital herpes is a common sexually transmitted infection (STI) that is caused by the herpes simplex virus (HSV) type 1 or type 2.  These viruses are most often spread through sexual contact with an infected person.  You are more likely to develop this condition if you have sex with many partners or you have unprotected sex.  Most people do not have symptoms (asymptomatic) or have mild symptoms that may be mistaken for other skin problems. Symptoms occur as outbreaks that may happen months or years apart.  There is no cure for this condition, but treatment with oral antiviral medicines can reduce symptoms, reduce the chance of  spreading the virus to a partner, prevent future outbreaks, or shorten future outbreaks. This information is not intended to replace advice given to you by your health care provider. Make sure you discuss any questions you have with your health care provider. Document Released: 08/04/2000 Document Revised: 07/07/2016 Document Reviewed: 07/07/2016 Elsevier Interactive Patient Education  [employer].

## 2017-11-27 NOTE — Progress Notes (Signed)
Chief complaint: 1.  Vulvar ulcers and pain  Patient presents for evaluation of possible genital herpes. Patient developed significant vulvar irritation with pain 5 days ago.  She began using topical lidocaine gel, Desitin, and Abreva with some improvement in symptomatology. Her symptoms are primarily on the left labia minora and left side of clitoral hood. Patient states that HSV testing in July of last year was negative. 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  Patient has been divorced since July 2018. Patient has been with her former husband in October 2018. Patient has been with new partner over the past 1-2 months; his testing as a 5 days ago was positive for HSV 1 but negative for HSV 2.  Past medical history, past surgical history, problem list, medications, and allergies are reviewed  OBJECTIVE: BP (!) 150/77   Pulse 80   Ht 5' 4.5" (1.638 m)   Wt 196 lb 12.8 oz (89.3 kg)   LMP 11/25/2017 (Exact Date)   BMI 33.26 kg/m  Pleasant slightly anxious female no acute distress.  Alert and oriented. Pelvic exam: External genitalia-minimal menstrual blood is noted on the perineum; there is a 1 cm diameter area of hyperemia with small vesicles noted on the left labia minora which is tender to palpation with Q-tip; just to the left of the clitoral hood is an area of tenderness with minimal hyperemia without evidence of ulceration no other abnormalities are noted.  ASSESSMENT: 1.  Suspected herpes genitalis, possibly first outbreak  PLAN: 1.  Valtrex 1 g twice a day for 10 days 2.  Continue using Desitin and lidocaine gel topically as needed for assistance with symptom management 3.  Return as needed. 4.  Literature regarding HSV outbreaks is given 5.  Literature regarding Valtrex is given 6.  Patient understands that if she has more than 3 outbreaks a year, she may need to go on chronic suppression for herpes  A total of 15 minutes were spent face-to-face with the  patient during this encounter and over half of that time dealt with counseling and coordination of care.  Brayton Mars, MD  Note: This dictation was prepared with Dragon dictation along with smaller phrase technology. Any transcriptional errors that result from this process are unintentional.

## 2018-01-04 ENCOUNTER — Encounter: Payer: Managed Care, Other (non HMO) | Admitting: Obstetrics and Gynecology

## 2018-01-08 ENCOUNTER — Encounter: Payer: Self-pay | Admitting: Obstetrics and Gynecology

## 2018-01-08 ENCOUNTER — Ambulatory Visit: Payer: Managed Care, Other (non HMO) | Admitting: Obstetrics and Gynecology

## 2018-01-08 VITALS — BP 162/82 | HR 83 | Ht 64.5 in | Wt 200.1 lb

## 2018-01-08 DIAGNOSIS — B009 Herpesviral infection, unspecified: Secondary | ICD-10-CM | POA: Diagnosis not present

## 2018-01-08 DIAGNOSIS — N766 Ulceration of vulva: Secondary | ICD-10-CM

## 2018-01-08 MED ORDER — VALACYCLOVIR HCL 1 G PO TABS: 1000.0000 mg | ORAL_TABLET | Freq: Every day | ORAL | 6 refills | Status: DC

## 2018-01-08 NOTE — Progress Notes (Signed)
Chief complaint: 1.  Vulvar ulcers 2.  History of HSV 2 3.  Chronic pain  [name] [name] presents today for follow-up.  She continues to have significant irritation of the vulva.  She has completed a course for acute HSV infection taking acyclovir twice daily for 10 days.  Symptoms persist at this time.  Urination makes the symptoms worse when urine contacts the exposed skin.  She describes the pain as a shooting pain being internally.  She denies UTI symptoms.  Patient states that her former husband does not report having had genital herpes Current partner states that he is positive for HSV 1 but not HSV-2. Patient is positive for both HSV-1 and HSV-2  Past medical history: Past surgical history, problem list, medications, and allergies are reviewed  OBJECTIVE: BP (!) 162/82   Pulse 83   Ht 5' 4.5" (1.638 m)   Wt 200 lb 1.6 oz (90.8 kg)   LMP 12/24/2017   BMI 33.82 kg/m  Pleasant female in no acute distress.  Slightly anxious. Pelvic exam: External genitalia-normal except for a healing ulcer noted on the right labia majora measuring approximately 5 mm in diameter.  At the posterior fourchette there are 2 linear ulcerations several millimeters by 7 mm in length on the left and right angles of the perianal region/posterior fourchette.  These areas are exquisitely tender. BUS-normal Vagina-minimal white secretions in vaginal vault mixed with medication (recent yeast medication treatment) Cervix-no lesions Bimanual-not done Rectovaginal-perianal ulcerations as noted in the external genitalia description  ASSESSMENT: 1.  History of genital herpes 2.  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  Bilateral ulcers at the posterior fourchette extending toward the anus 4.  Right labia majora healing ulcer  PLAN: 1.  Suppression of HSV 2 with Valtrex 1 g daily 2.  Cotton underwear 3.  Blood drying perineum following bathing 4.  Return in 4 weeks for follow-up  A total  of 15 minutes were spent face-to-face with the patient during this encounter and over half of that time dealt with counseling and coordination of care.  Brayton Mars, MD  Note: This dictation was prepared with Dragon dictation along with smaller phrase technology. Any transcriptional errors that result from this process are unintentional.

## 2018-01-08 NOTE — Patient Instructions (Signed)
1.  Take Valtrex 1 g daily for suppression of HSV 2.  Wear cotton underwear 3.  Use hair dryer for blood drying perineum after bathing 4.  Return in 4 weeks for follow-up

## 2018-01-22 ENCOUNTER — Other Ambulatory Visit: Payer: Self-pay | Admitting: Obstetrics and Gynecology

## 2018-02-12 ENCOUNTER — Encounter: Payer: Managed Care, Other (non HMO) | Admitting: Obstetrics and Gynecology

## 2018-02-20 ENCOUNTER — Encounter: Payer: Managed Care, Other (non HMO) | Admitting: Obstetrics and Gynecology

## 2018-03-14 ENCOUNTER — Encounter: Payer: Managed Care, Other (non HMO) | Admitting: Obstetrics and Gynecology

## 2018-04-04 ENCOUNTER — Encounter: Payer: Managed Care, Other (non HMO) | Admitting: Obstetrics and Gynecology

## 2018-05-01 ENCOUNTER — Encounter: Payer: Managed Care, Other (non HMO) | Admitting: Obstetrics and Gynecology

## 2018-05-09 ENCOUNTER — Telehealth: Payer: Self-pay | Admitting: Obstetrics and Gynecology

## 2018-05-09 NOTE — Telephone Encounter (Signed)
The patient called and stated that she has had an extremely heavy flow and severe cramps for a week now and she would like to know if something can be called into her pharmacy to stop the bleeding. Please advise.

## 2018-05-10 ENCOUNTER — Other Ambulatory Visit: Payer: Self-pay | Admitting: Obstetrics and Gynecology

## 2018-05-10 MED ORDER — TRANEXAMIC ACID 650 MG PO TABS
1300.0000 mg | ORAL_TABLET | Freq: Three times a day (TID) | ORAL | 2 refills | Status: DC
Start: 2018-05-10 — End: 2018-06-27

## 2018-05-10 NOTE — Telephone Encounter (Signed)
Please let her know I sent in a prescription for Lysteda- to take as directed for the next three days, but not to take it with the ibuprofen or provera she has been prescribed in the past. Not sure it will work as well with the fibroids but should help.   Kade Demicco

## 2018-05-10 NOTE — Telephone Encounter (Signed)
pls advise

## 2018-05-10 NOTE — Telephone Encounter (Signed)
Left pt message about rx

## 2018-05-14 ENCOUNTER — Encounter: Payer: Self-pay | Admitting: Behavioral Health

## 2018-05-14 DIAGNOSIS — F411 Generalized anxiety disorder: Secondary | ICD-10-CM

## 2018-05-14 DIAGNOSIS — F4310 Post-traumatic stress disorder, unspecified: Secondary | ICD-10-CM

## 2018-05-18 ENCOUNTER — Encounter: Payer: Self-pay | Admitting: Emergency Medicine

## 2018-05-18 ENCOUNTER — Other Ambulatory Visit: Payer: Self-pay

## 2018-05-18 ENCOUNTER — Emergency Department
Admission: EM | Admit: 2018-05-18 | Discharge: 2018-05-18 | Disposition: A | Discharge status: Home / Self Care | Payer: Managed Care, Other (non HMO) | Attending: Emergency Medicine | Admitting: Emergency Medicine

## 2018-05-18 DIAGNOSIS — Z87891 Personal history of nicotine dependence: Secondary | ICD-10-CM | POA: Insufficient documentation

## 2018-05-18 DIAGNOSIS — N3090 Cystitis, unspecified without hematuria: Secondary | ICD-10-CM | POA: Diagnosis not present

## 2018-05-18 DIAGNOSIS — Z79899 Other long term (current) drug therapy: Secondary | ICD-10-CM | POA: Diagnosis not present

## 2018-05-18 DIAGNOSIS — R350 Frequency of micturition: Secondary | ICD-10-CM | POA: Diagnosis present

## 2018-05-18 DIAGNOSIS — Z7982 Long term (current) use of aspirin: Secondary | ICD-10-CM | POA: Insufficient documentation

## 2018-05-18 LAB — URINALYSIS, COMPLETE (UACMP) WITH MICROSCOPIC
Bacteria, UA: NONE SEEN
Bilirubin Urine: NEGATIVE
Glucose, UA: NEGATIVE mg/dL
Hgb urine dipstick: NEGATIVE
Ketones, ur: NEGATIVE mg/dL
Leukocytes, UA: NEGATIVE
Nitrite: NEGATIVE
PH: 7 (ref 5.0–8.0)
Protein, ur: NEGATIVE mg/dL
SPECIFIC GRAVITY, URINE: 1.009 (ref 1.005–1.030)

## 2018-05-18 MED ORDER — CEPHALEXIN 500 MG PO CAPS: 500.0000 mg | ORAL_CAPSULE | Freq: Three times a day (TID) | ORAL | 0 refills | Status: AC

## 2018-05-18 NOTE — ED Notes (Signed)
Pt states that she is unable to void at this time 

## 2018-05-18 NOTE — ED Triage Notes (Signed)
Pt arrives ambulatory with steadu gait to triage with c/o loer back pain x 2 days. Pt reports incraesed urinary pressure and pain. Pt denies any injury or trauma at this time. Pt is in NAD.

## 2018-05-18 NOTE — ED Provider Notes (Signed)
[facility] Emergency Department Provider Note  ____________________________________________  Time seen: Approximately 9:40 PM  I have reviewed the triage vital signs and the nursing notes.   HISTORY  Chief Complaint Back Pain    HPI [name] [name] is a 52 y.o. female presents to the emergency department with increased urinary frequency, low back pain and suprapubic discomfort for the past two days.  Patient has subjective complaints of low-grade fever at home.  No abdominal discomfort, pain, nausea or vomiting.  Patient reports that it has been "several years since she has had a urinary tract infection.  No changes in vaginal discharge or concern for STDs.  No prior history of nephrolithiasis or pyelonephritis.   Past Medical History:  Diagnosis Date  . A-fib ([place])   . Anxiety   . Arthritis   . Chicken pox   . Depression   . Fibroid   . GERD (gastroesophageal reflux disease)   . Migraines     Patient Active Problem List   Diagnosis Date Noted  . GAD (generalized anxiety disorder) 05/14/2018  . PTSD (post-traumatic stress disorder) 05/14/2018  . HSV (herpes simplex virus) infection 11/27/2017  . Depression   . PMDD (premenstrual dysphoric disorder) 03/08/2017  . Anemia 03/08/2017  . Dysmenorrhea 03/08/2017  . Menorrhagia with regular cycle 03/08/2017  . History of 2 cesarean sections 03/08/2017  . Leg swelling 11/05/2016  . Chest pain 11/05/2016  . Encounter to establish care 12/10/2014  . Uterine leiomyoma 12/10/2014  . Bipolar I disorder, most recent episode depressed ([place]) 12/10/2014  . GERD (gastroesophageal reflux disease) 12/10/2014  . Back pain 12/10/2014  . Migraines 12/10/2014  . Fatigue 05/30/2013  . A-fib ([place]) 04/10/2013  . Inguinal cyst 02/10/2013    Past Surgical History:  Procedure Laterality Date  . APPENDECTOMY  5/11  . C-spine surgery    . CESAREAN SECTION  10/98,9/12  . TONSILLECTOMY  03/2008  . TUBAL LIGATION       Prior to Admission medications   Medication Sig Start Date End Date Taking? Authorizing Provider  aspirin 81 MG EC tablet Take 81 mg by mouth daily. 11/14/16   [provider]  cephALEXin (KEFLEX) 500 MG capsule Take 1 capsule (500 mg total) by mouth 3 (three) times daily for 7 days. 05/18/18 05/25/18  Lannie Fields, PA-C  ferrous sulfate (EQL SLOW RELEASE IRON) 160 (50 Fe) MG TBCR SR tablet Take 1 tablet (160 mg total) by mouth daily. 12/26/16   Earleen Newport, MD  ibuprofen (ADVIL,MOTRIN) 600 MG tablet Take 1 tablet (600 mg total) by mouth every 8 (eight) hours as needed. 04/02/16   Lisa Roca, MD  lamoTRIgine (LAMICTAL) 200 MG tablet Take 200 mg by mouth at bedtime.     [provider]  medroxyPROGESTERone (PROVERA) 5 MG tablet TAKE 1 TABLET BY MOUTH  DAILY 01/22/18   Defrancesco, Alanda Slim, MD  metoprolol tartrate (LOPRESSOR) 25 MG tablet TAKE 1 TABLET (25 MG TOTAL) BY MOUTH 2 (TWO) TIMES DAILY AS NEEDED. 11/06/16   [provider]  omeprazole (PRILOSEC) 20 MG capsule Take 20 mg by mouth daily.    [provider]  Oxcarbazepine (TRILEPTAL) 300 MG tablet Take 900 mg by mouth daily.    [provider]  sertraline (ZOLOFT) 100 MG tablet Take 100 mg by mouth daily. 07/18/17   [provider]  tranexamic acid (LYSTEDA) 650 MG TABS tablet Take 2 tablets (1,300 mg total) by mouth 3 (three) times daily. Take during menses  for a maximum of five days 05/10/18   Shambley, Melody N, CNM  traZODone (DESYREL) 50 MG tablet Take by mouth. 06/15/17   [provider]  valACYclovir (VALTREX) 1000 MG tablet Take 1 tablet (1,000 mg total) by mouth 2 (two) times daily. Take for ten days. 11/27/17   Defrancesco, Alanda Slim, MD  valACYclovir (VALTREX) 1000 MG tablet Take 1 tablet (1,000 mg total) by mouth daily. For chronic suppression 01/08/18   Defrancesco, Alanda Slim, MD  Vitamin D, Ergocalciferol, (DRISDOL) 50000 units CAPS capsule TAKE 1 CAPSULE (50,000  UNITS TOTAL) BY MOUTH 2 (TWO) TIMES A WEEK. 09/03/17   Shambley, Melody N, CNM    Allergies Phenylmercuric nitrate; Augmentin [amoxicillin-pot clavulanate]; Codeine; Diclofenac; Latex; Neomycin; and Percocet [oxycodone-acetaminophen]  Family History  Problem Relation Age of Onset  . Diabetes Mother   . Alcohol abuse Father   . Arthritis Father   . Heart disease Father   . Arthritis Sister   . Alcohol abuse Maternal Aunt   . Alcohol abuse Maternal Uncle   . Alcohol abuse Paternal Grandfather   . Arthritis Maternal Grandmother   . Schizophrenia Maternal Grandmother   . Breast cancer Neg Hx     Social History Social History   Tobacco Use  . Smoking status: Former Smoker    Packs/day: 1.00    Years: 10.00    Pack years: 10.00    Types: Cigarettes    Last attempt to quit: 08/22/1991    Years since quitting: 26.7  . Smokeless tobacco: Never Used  Substance Use Topics  . Alcohol use: Yes    Alcohol/week: 0.0 standard drinks    Comment: twice a year   . Drug use: No     Review of Systems  Constitutional: Patient has fever.  Eyes: No visual changes. No discharge ENT: No upper respiratory complaints. Cardiovascular: no chest pain. Respiratory: no cough. No SOB. Gastrointestinal: No abdominal pain.  No nausea, no vomiting.  No diarrhea.  No constipation. Genitourinary: Patient has increases urinary frequency, suprapubic discomfort and low back pain.  Musculoskeletal: Negative for musculoskeletal pain. Skin: Negative for rash, abrasions, lacerations, ecchymosis. Neurological: Negative for headaches, focal weakness or numbness.  ____________________________________________   PHYSICAL EXAM:  VITAL SIGNS: ED Triage Vitals  Enc Vitals Group     BP 05/18/18 1912 133/85     Pulse Rate 05/18/18 1912 79     Resp 05/18/18 1912 16     Temp 05/18/18 1912 98.7 F (37.1 C)     Temp Source 05/18/18 1912 Oral     SpO2 05/18/18 1912 99 %     Weight 05/18/18 1910 200 lb (90.7 kg)      Height 05/18/18 1910 5\' 5"  (1.651 m)     Head Circumference --      Peak Flow --      Pain Score 05/18/18 1910 7     Pain Loc --      Pain Edu? --      Excl. in [place]? --      Constitutional: Alert and oriented. Well appearing and in no acute distress. Eyes: Conjunctivae are normal. PERRL. EOMI. Head: Atraumatic. Cardiovascular: Normal rate, regular rhythm. Normal S1 and S2.  Good peripheral circulation. Respiratory: Normal respiratory effort without tachypnea or retractions. Lungs CTAB. Good air entry to the bases with no decreased or absent breath sounds. Gastrointestinal: Bowel sounds 4 quadrants.  Patient has suprapubic tenderness to palpation.. No guarding or rigidity. No palpable masses. No distention. No CVA tenderness. Musculoskeletal:  Full range of motion to all extremities. No gross deformities appreciated. Neurologic:  Normal speech and language. No gross focal neurologic deficits are appreciated.  Skin:  Skin is warm, dry and intact. No rash noted. Psychiatric: Mood and affect are normal. Speech and behavior are normal. Patient exhibits appropriate insight and judgement.   ____________________________________________   LABS (all labs ordered are listed, but only abnormal results are displayed)  Labs Reviewed  URINALYSIS, COMPLETE (UACMP) WITH MICROSCOPIC - Abnormal; Notable for the following components:      Result Value   Color, Urine STRAW (*)    APPearance CLEAR (*)    All other components within normal limits   ____________________________________________  EKG   ____________________________________________  RADIOLOGY   No results found.  ____________________________________________    PROCEDURES  Procedure(s) performed:    Procedures    Medications - No data to display   ____________________________________________   INITIAL IMPRESSION / ASSESSMENT AND PLAN / ED COURSE  Pertinent labs & imaging results that were available during my  care of the patient were reviewed by me and considered in my medical decision making (see chart for details).  Review of the [place] CSRS was performed in accordance of the [place] prior to dispensing any controlled drugs.      Assessment and plan:  Cystitis Patient presents to the emergency department with low back pain, increased urinary frequency and low-grade fever for the past 2 days.  Urinalysis was noncontributory for cystitis at this time.  Patient was treated empirically with Keflex and advised to follow-up with primary care as needed.  All patient questions were answered.    ____________________________________________  FINAL CLINICAL IMPRESSION(S) / ED DIAGNOSES  Final diagnoses:  Cystitis      NEW MEDICATIONS STARTED DURING THIS VISIT:  ED Discharge Orders         Ordered    cephALEXin (KEFLEX) 500 MG capsule  3 times daily     05/18/18 2138              This chart was dictated using voice recognition software/Dragon. Despite best efforts to proofread, errors can occur which can change the meaning. Any change was purely unintentional.    Karren Cobble 05/18/18 2145    Lavonia Drafts, MD 05/18/18 2156

## 2018-05-23 ENCOUNTER — Telehealth: Payer: Self-pay | Admitting: Psychiatry

## 2018-05-23 NOTE — Telephone Encounter (Signed)
[name] [name] called with a complaint that she had called on Monday with a reaction to medication. She never got a call back so she wound up going to PCP. Her PCP advised her what to do, suspected serotonin reaction. Pt expresses that she researched interaction between Trazadone and Sertraline and is concerned that these may be dangerous and interact. Please advise and follow up.

## 2018-05-23 NOTE — Telephone Encounter (Signed)
Pt took trazadone with sertraline on Sunday night.  She awoke feeling dizzy with a heart rate of 99 when her normal is in the 60s.  Blood pressure was up to 178/98.  She felt restless and jittery.  She did not have confusion fever chills muscle rigidity.  She drank a lot of water and the symptoms resolved by the end of the day.  She read about serotonin syndrome and wondered if she had some of those symptoms.  It is likely the case though she did not have the full syndrome and it has resolved.  She is advised to no longer take trazodone with sertraline and she agrees.  She will call back if she has any further problems.  Sleep is adequate generally.

## 2018-05-24 ENCOUNTER — Encounter: Payer: Managed Care, Other (non HMO) | Admitting: Obstetrics and Gynecology

## 2018-05-30 ENCOUNTER — Other Ambulatory Visit (HOSPITAL_COMMUNITY)
Admission: RE | Admit: 2018-05-30 | Discharge: 2018-05-30 | Disposition: A | Discharge status: Home / Self Care | Payer: Managed Care, Other (non HMO) | Source: Ambulatory Visit | Attending: Obstetrics and Gynecology | Admitting: Obstetrics and Gynecology

## 2018-05-30 ENCOUNTER — Encounter: Payer: Self-pay | Admitting: Obstetrics and Gynecology

## 2018-05-30 ENCOUNTER — Ambulatory Visit (INDEPENDENT_AMBULATORY_CARE_PROVIDER_SITE_OTHER): Payer: Managed Care, Other (non HMO) | Admitting: Obstetrics and Gynecology

## 2018-05-30 ENCOUNTER — Telehealth: Payer: Self-pay | Admitting: Obstetrics and Gynecology

## 2018-05-30 VITALS — BP 138/84 | HR 81 | Ht 65.0 in | Wt 204.9 lb

## 2018-05-30 DIAGNOSIS — E559 Vitamin D deficiency, unspecified: Secondary | ICD-10-CM

## 2018-05-30 DIAGNOSIS — Z01411 Encounter for gynecological examination (general) (routine) with abnormal findings: Secondary | ICD-10-CM

## 2018-05-30 DIAGNOSIS — Z23 Encounter for immunization: Secondary | ICD-10-CM | POA: Diagnosis not present

## 2018-05-30 DIAGNOSIS — D259 Leiomyoma of uterus, unspecified: Secondary | ICD-10-CM

## 2018-05-30 DIAGNOSIS — Z1272 Encounter for screening for malignant neoplasm of vagina: Secondary | ICD-10-CM | POA: Insufficient documentation

## 2018-05-30 DIAGNOSIS — N946 Dysmenorrhea, unspecified: Secondary | ICD-10-CM | POA: Diagnosis not present

## 2018-05-30 DIAGNOSIS — Z01419 Encounter for gynecological examination (general) (routine) without abnormal findings: Secondary | ICD-10-CM

## 2018-05-30 DIAGNOSIS — M25512 Pain in left shoulder: Secondary | ICD-10-CM

## 2018-05-30 DIAGNOSIS — N938 Other specified abnormal uterine and vaginal bleeding: Secondary | ICD-10-CM

## 2018-05-30 NOTE — Patient Instructions (Signed)
Hysterectomy Information A hysterectomy is a surgery to remove your uterus. After surgery, you will no longer have periods. Also, you will not be able to get pregnant. Reasons for this surgery  You have bleeding that is not normal and keeps coming back.  You have lasting (chronic) lower belly (pelvic) pain.  You have a lasting infection.  The lining of your uterus grows outside your uterus.  The lining of your uterus grows in the muscle of your uterus.  Your uterus falls down into your vagina.  You have a growth in your uterus that causes problems.  You have cells that could turn into cancer (precancerous cells).  You have cancer of the uterus or cervix. Types There are 3 types of hysterectomies. Depending on the type, the surgery will:  Remove the top part of the uterus only.  Remove the uterus and the cervix.  Remove the uterus, cervix, and tissue that holds the uterus in place in the lower belly.  Ways a hysterectomy can be performed There are 5 ways this surgery can be performed.  A cut (incision) is made in the belly (abdomen). The uterus is taken out through the cut.  A cut is made in the vagina. The uterus is taken out through the cut.  Three or four cuts are made in the belly. A surgical device with a camera is put through one of the cuts. The uterus is cut into small pieces. The uterus is taken out through the cuts or the vagina.  Three or four cuts are made in the belly. A surgical device with a camera is put through one of the cuts. The uterus is taken out through the vagina.  Three or four cuts are made in the belly. A surgical device that is controlled by a computer makes a visual image. The device helps the surgeon control the surgical tools. The uterus is cut into small pieces. The pieces are taken out through the cuts or through the vagina.  What can I expect after the surgery?  You will be given pain medicine.  You will need help at home for 3-5 days  after surgery.  You will need to see your doctor in 2-4 weeks after surgery.  You may get hot flashes, have night sweats, and have trouble sleeping.  You may need to have Pap tests in the future if your surgery was related to cancer. Talk to your doctor. It is still good to have regular exams. This information is not intended to replace advice given to you by your health care provider. Make sure you discuss any questions you have with your health care provider. Document Released: 10/30/2011 Document Revised: 01/13/2016 Document Reviewed: 04/14/2013 Elsevier Interactive Patient Education  2018 Elsevier Inc.  

## 2018-05-30 NOTE — Telephone Encounter (Signed)
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

## 2018-05-30 NOTE — Progress Notes (Signed)
Subjective:   [name] [name] is a 52 y.o. [phone] Caucasian female here for a routine well-woman exam.  Patient's last menstrual period was 04/23/2018.    Current complaints: left shoulder pain for 3 days, interfering with ADLs as she is left handed. Reports hurting it years ago and it 'flares up at times' has gotten injections in the past and request referral to orthopedics. Denies any recent trauma.  Also reports menses are still heavy and painful and irregular in timing. Desires hysterectomy as we have discussed in past.   PCP: [place]       Does need & desire labs  Social History: Sexual: heterosexual Marital Status: divorced Living situation: with 6 yo daughter & 30 yo son Occupation: phebotomist for [employer] Tobacco/alcohol: no tobacco use Illicit drugs: no history of illicit drug use  The following portions of the patient's history were reviewed and updated as appropriate: allergies, current medications, past family history, past medical history, past social history, past surgical history and problem list.  Past Medical History Past Medical History:  Diagnosis Date  . A-fib ([place])   . Anxiety   . Arthritis   . Chicken pox   . Depression   . Fibroid   . GERD (gastroesophageal reflux disease)   . Migraines     Past Surgical History Past Surgical History:  Procedure Laterality Date  . APPENDECTOMY  5/11  . C-spine surgery    . CESAREAN SECTION  10/98,9/12  . TONSILLECTOMY  03/2008  . TUBAL LIGATION      Gynecologic History [id number]  Patient's last menstrual period was 04/23/2018. Contraception: none Last Pap: 2018. Results were: abnormal Last mammogram: 12/2016. Results were: normal   Obstetric History OB History  Gravida Para Term Preterm AB Living  5 3 2 1 2 2   SAB TAB Ectopic Multiple Live Births    2     2    # Outcome Date GA Lbr Len/2nd Weight Sex Delivery Anes PTL Lv  5 Term 2012   6 lb 1.9 oz (2.776 kg) F CS-LTranv   LIV  4 Preterm 2010        FD   3 Term 1998   7 lb 2.1 oz (3.234 kg) M CS-LTranv   LIV  2 TAB           1 TAB             Obstetric Comments  1st Menstrual Cycle:  16  1st Pregnancy:  17    Current Medications Current Outpatient Medications on File Prior to Visit  Medication Sig Dispense Refill  . aspirin 81 MG EC tablet Take 81 mg by mouth daily.  3  . ferrous sulfate (EQL SLOW RELEASE IRON) 160 (50 Fe) MG TBCR SR tablet Take 1 tablet (160 mg total) by mouth daily. 30 each 6  . ibuprofen (ADVIL,MOTRIN) 600 MG tablet Take 1 tablet (600 mg total) by mouth every 8 (eight) hours as needed. 20 tablet 0  . lamoTRIgine (LAMICTAL) 200 MG tablet Take 200 mg by mouth at bedtime.     . metoprolol tartrate (LOPRESSOR) 25 MG tablet TAKE 1 TABLET (25 MG TOTAL) BY MOUTH 2 (TWO) TIMES DAILY AS NEEDED.    Marland Kitchen omeprazole (PRILOSEC) 20 MG capsule Take 20 mg by mouth daily.    . Oxcarbazepine (TRILEPTAL) 300 MG tablet Take 900 mg by mouth daily.    . sertraline (ZOLOFT) 100 MG tablet Take 100 mg by mouth daily.  1  . Vitamin D,  Ergocalciferol, (DRISDOL) 50000 units CAPS capsule TAKE 1 CAPSULE (50,000 UNITS TOTAL) BY MOUTH 2 (TWO) TIMES A WEEK. 30 capsule 2  . medroxyPROGESTERone (PROVERA) 5 MG tablet TAKE 1 TABLET BY MOUTH  DAILY (Patient not taking: Reported on 05/30/2018) 90 tablet 1  . tranexamic acid (LYSTEDA) 650 MG TABS tablet Take 2 tablets (1,300 mg total) by mouth 3 (three) times daily. Take during menses for a maximum of five days (Patient not taking: Reported on 05/30/2018) 30 tablet 2  . valACYclovir (VALTREX) 1000 MG tablet Take 1 tablet (1,000 mg total) by mouth 2 (two) times daily. Take for ten days. (Patient not taking: Reported on 05/30/2018) 20 tablet 3   Current Facility-Administered Medications on File Prior to Visit  Medication Dose Route Frequency Provider Last Rate Last Dose  . betamethasone acetate-betamethasone sodium phosphate (CELESTONE) injection 3 mg  3 mg Intramuscular Once Edrick Kins, DPM        Review  of Systems Patient denies any headaches, blurred vision, shortness of breath, chest pain, abdominal pain, problems with bowel movements, urination, or intercourse.  Objective:  BP 138/84   Pulse 81   Ht 5\' 5"  (1.651 m)   Wt 204 lb 14.4 oz (92.9 kg)   LMP 04/23/2018 Comment: period for 17 days  BMI 34.10 kg/m  Physical Exam  General:  Well developed, obese, no acute distress. She is alert and oriented x3. Skin:  Warm and dry Neck:  Midline trachea, no thyromegaly or nodules Cardiovascular: Regular rate and rhythm, no murmur heard Lungs:  Effort normal, all lung fields clear to auscultation bilaterally Breasts:  No dominant palpable mass, retraction, or nipple discharge Abdomen:  Soft, non tender, no hepatosplenomegaly or masses Pelvic:  External genitalia is normal in appearance.  The vagina is normal in appearance. The cervix is bulbous, no CMT.  Thin prep pap is done with HR HPV cotesting. Uterus is felt to be slightly enlarged & retroflexed, but normal shape, and contour.  No adnexal masses or tenderness noted. Extremities:  No swelling or varicosities noted Psych:  She has a normal mood and affect  Assessment:   Healthy well-woman exam DUB Uterine fibroid Obesity Left shoulder pain Needs flu vaccine Needs screening colonoscopy Vitamin d deficiency Plan:  Labs obtained-will follow up accordingly Will return in 2 weeks for pelvic ultrasound and to see Dr Enzo Bi about hysterectomy. Desires surgery in mid to late November.  Referred to [facility] orthopedics for left should Flu vaccine given F/U 2 weeks  for ultrasound & consult with Dr Enzo Bi for surgery, or sooner if needed Mammogram ordered or sooner if problems Colonoscopy due-referral placed  Melody Rockney Ghee, CNM

## 2018-06-01 LAB — COMPREHENSIVE METABOLIC PANEL
ALT: 12 IU/L (ref 0–32)
AST: 11 IU/L (ref 0–40)
Albumin/Globulin Ratio: 1.6 (ref 1.2–2.2)
Albumin: 4.2 g/dL (ref 3.5–5.5)
Alkaline Phosphatase: 85 IU/L (ref 39–117)
BUN/Creatinine Ratio: 17 (ref 9–23)
BUN: 14 mg/dL (ref 6–24)
Bilirubin Total: <0.2 mg/dL (ref 0.0–1.2)
CALCIUM: 10.2 mg/dL (ref 8.7–10.2)
CO2: 23 mmol/L (ref 20–29)
CREATININE: 0.84 mg/dL (ref 0.57–1.00)
Chloride: 104 mmol/L (ref 96–106)
GFR calc Af Amer: 94 mL/min/{1.73_m2} (ref >59)
GFR, EST NON AFRICAN AMERICAN: 81 mL/min/{1.73_m2} (ref >59)
GLOBULIN, TOTAL: 2.7 g/dL (ref 1.5–4.5)
Glucose: 128 mg/dL — ABNORMAL HIGH (ref 65–99)
Potassium: 4.3 mmol/L (ref 3.5–5.2)
SODIUM: 139 mmol/L (ref 134–144)
Total Protein: 6.9 g/dL (ref 6.0–8.5)

## 2018-06-01 LAB — B12 AND FOLATE PANEL
Folate: 6.5 ng/mL (ref >3.0)
Vitamin B-12: 163 pg/mL — ABNORMAL LOW (ref 232–1245)

## 2018-06-01 LAB — CBC
Hematocrit: 32.7 % — ABNORMAL LOW (ref 34.0–46.6)
Hemoglobin: 10.1 g/dL — ABNORMAL LOW (ref 11.1–15.9)
MCH: 22.6 pg — AB (ref 26.6–33.0)
MCHC: 30.9 g/dL — AB (ref 31.5–35.7)
MCV: 73 fL — ABNORMAL LOW (ref 79–97)
PLATELETS: 489 10*3/uL — AB (ref 150–450)
RBC: 4.47 x10E6/uL (ref 3.77–5.28)
RDW: 17 % — ABNORMAL HIGH (ref 12.3–15.4)
WBC: 11.5 10*3/uL — ABNORMAL HIGH (ref 3.4–10.8)

## 2018-06-01 LAB — HEMOGLOBIN A1C
Est. average glucose Bld gHb Est-mCnc: 108 mg/dL
Hgb A1c MFr Bld: 5.4 % (ref 4.8–5.6)

## 2018-06-01 LAB — TSH: TSH: 0.88 u[IU]/mL (ref 0.450–4.500)

## 2018-06-01 LAB — LIPID PANEL
CHOL/HDL RATIO: 2.7 ratio (ref 0.0–4.4)
Cholesterol, Total: 181 mg/dL (ref 100–199)
HDL: 68 mg/dL (ref >39)
LDL CALC: 81 mg/dL (ref 0–99)
TRIGLYCERIDES: 159 mg/dL — AB (ref 0–149)
VLDL CHOLESTEROL CAL: 32 mg/dL (ref 5–40)

## 2018-06-01 LAB — VITAMIN D 25 HYDROXY (VIT D DEFICIENCY, FRACTURES): Vit D, 25-Hydroxy: 26.5 ng/mL — ABNORMAL LOW (ref 30.0–100.0)

## 2018-06-01 LAB — FERRITIN: FERRITIN: 12 ng/mL — AB (ref 15–150)

## 2018-06-01 IMAGING — DX DG CHEST 1V PORT
1 series · 1 of 1 positions shown · non-contrast
Comparison: 11/01/2016

CLINICAL DATA: Chest pain, neck pain, arm pain

EXAM:
PORTABLE CHEST 1 VIEW

[chest ap]
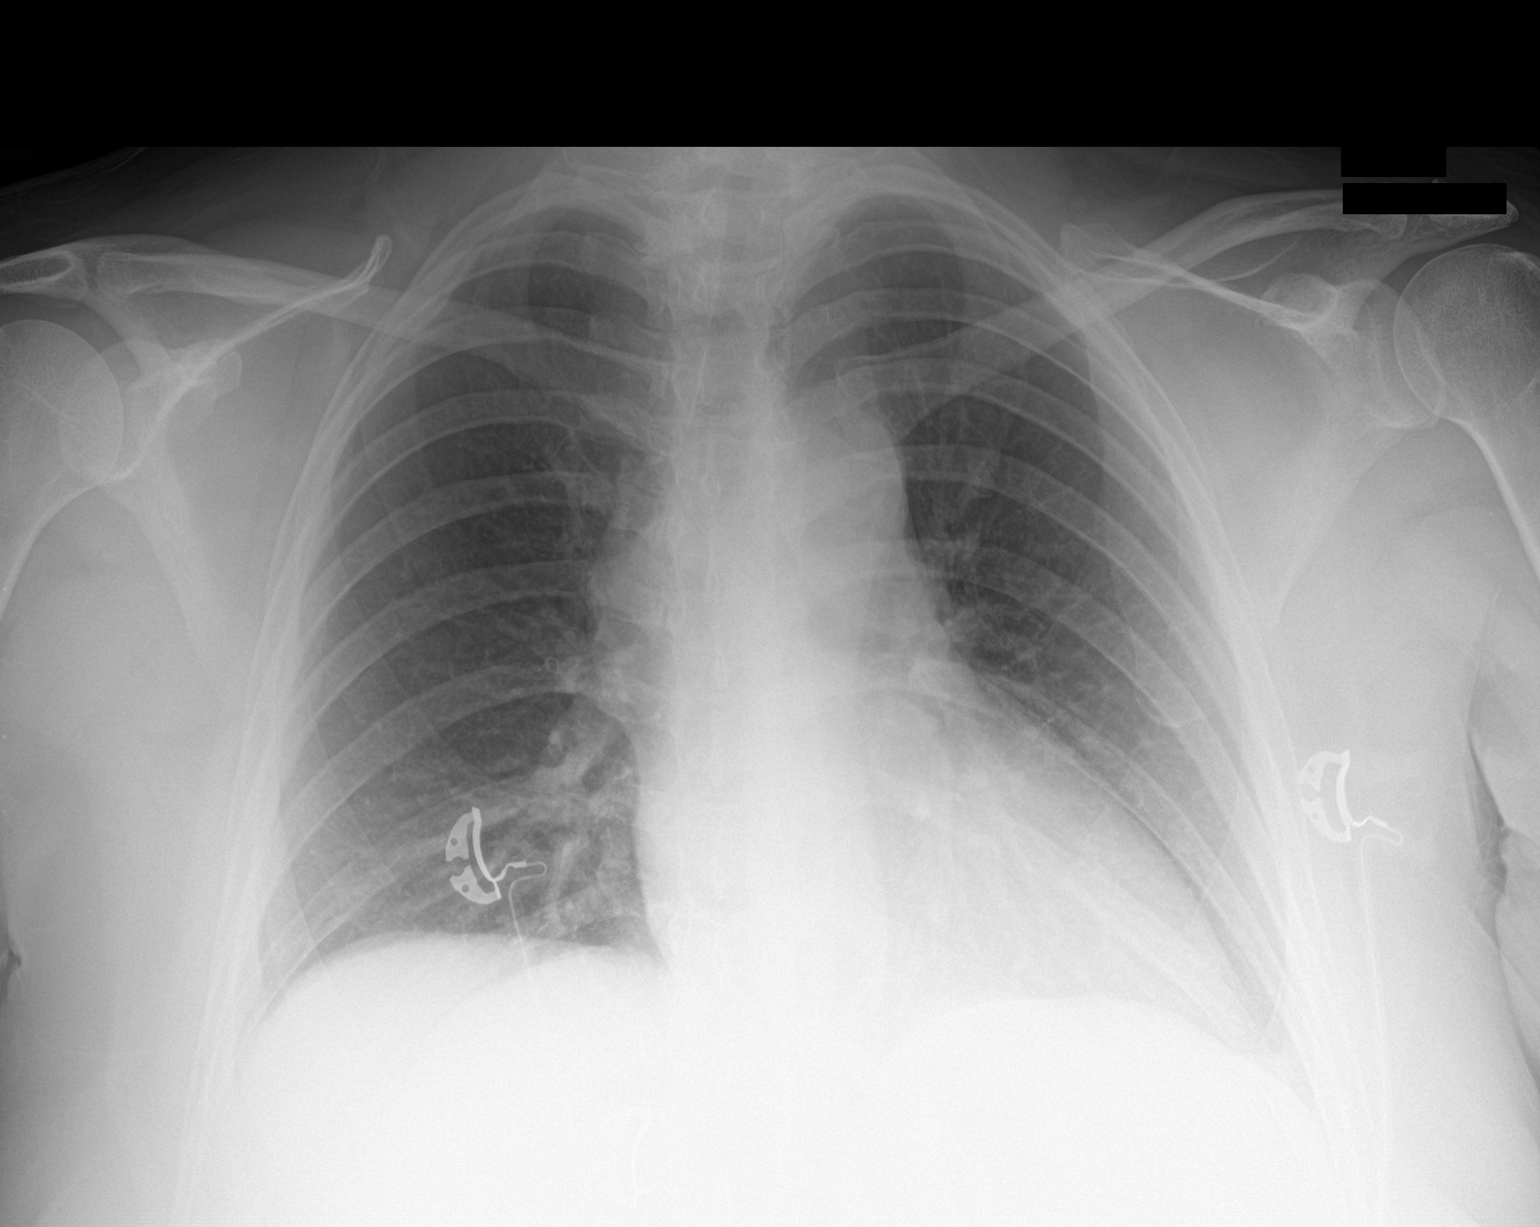

[1 of 1 positions shown; findings below may reference images not displayed]

FINDINGS: Heart and mediastinal contours are within normal limits. No focal
opacities or effusions. No acute bony abnormality.
IMPRESSION: No active disease.

## 2018-06-02 ENCOUNTER — Other Ambulatory Visit: Payer: Self-pay | Admitting: Psychiatry

## 2018-06-03 LAB — CYTOLOGY - PAP
Diagnosis: NEGATIVE
HPV: NOT DETECTED

## 2018-06-04 ENCOUNTER — Ambulatory Visit: Payer: Self-pay | Admitting: Psychiatry

## 2018-06-04 ENCOUNTER — Other Ambulatory Visit: Payer: Self-pay | Admitting: Obstetrics and Gynecology

## 2018-06-04 DIAGNOSIS — E538 Deficiency of other specified B group vitamins: Secondary | ICD-10-CM | POA: Insufficient documentation

## 2018-06-04 MED ORDER — VITAMIN D (ERGOCALCIFEROL) 1.25 MG (50000 UNIT) PO CAPS: 50000.0000 [IU] | ORAL_CAPSULE | ORAL | 2 refills | Status: DC

## 2018-06-04 MED ORDER — FUSION PLUS PO CAPS: 1.0000 | ORAL_CAPSULE | Freq: Every day | ORAL | 1 refills | Status: DC

## 2018-06-04 MED ORDER — CYANOCOBALAMIN 1000 MCG/ML IJ SOLN: 1000.0000 ug | INTRAMUSCULAR | 1 refills | Status: DC

## 2018-06-13 ENCOUNTER — Other Ambulatory Visit: Payer: Managed Care, Other (non HMO)

## 2018-06-13 ENCOUNTER — Encounter: Payer: Managed Care, Other (non HMO) | Admitting: Obstetrics and Gynecology

## 2018-06-27 ENCOUNTER — Ambulatory Visit (INDEPENDENT_AMBULATORY_CARE_PROVIDER_SITE_OTHER): Payer: Managed Care, Other (non HMO)

## 2018-06-27 ENCOUNTER — Encounter: Payer: Self-pay | Admitting: Obstetrics and Gynecology

## 2018-06-27 ENCOUNTER — Ambulatory Visit (INDEPENDENT_AMBULATORY_CARE_PROVIDER_SITE_OTHER): Payer: Managed Care, Other (non HMO) | Admitting: Obstetrics and Gynecology

## 2018-06-27 VITALS — BP 124/75 | HR 84 | Ht 65.0 in | Wt 201.0 lb

## 2018-06-27 DIAGNOSIS — D259 Leiomyoma of uterus, unspecified: Secondary | ICD-10-CM

## 2018-06-27 DIAGNOSIS — D252 Subserosal leiomyoma of uterus: Secondary | ICD-10-CM | POA: Diagnosis not present

## 2018-06-27 DIAGNOSIS — N946 Dysmenorrhea, unspecified: Secondary | ICD-10-CM

## 2018-06-27 DIAGNOSIS — N938 Other specified abnormal uterine and vaginal bleeding: Secondary | ICD-10-CM

## 2018-06-27 DIAGNOSIS — N920 Excessive and frequent menstruation with regular cycle: Secondary | ICD-10-CM

## 2018-06-27 DIAGNOSIS — N852 Hypertrophy of uterus: Secondary | ICD-10-CM

## 2018-06-27 NOTE — Patient Instructions (Signed)
1.  Total abdominal hysterectomy bilateral salpingo-oophorectomy is to be scheduled. 2.  Return 1 week prior to surgery for preop appointment   Abdominal Hysterectomy, Care After This sheet gives you information about how to care for yourself after your procedure. Your doctor may also give you more specific instructions. If you have problems or questions, contact your doctor. Follow these instructions at home: Bathing  Do not take baths, swim, or use a hot tub until your doctor says it is okay. Ask your doctor if you can take showers. You may only be allowed to take sponge baths for bathing.  Keep the bandage (dressing) dry until your doctor says it can be taken off. Surgical cut ( incision) care  Follow instructions from your doctor about how to take care of your cut from surgery. Make sure you: ? Wash your hands with soap and water before you change your bandage (dressing). If you cannot use soap and water, use hand sanitizer. ? Change your bandage as told by your doctor. ? Leave stitches (sutures), skin glue, or skin tape (adhesive) strips in place. They may need to stay in place for 2 weeks or longer. If tape strips get loose and curl up, you may trim the loose edges. Do not remove tape strips completely unless your doctor says it is okay.  Check your surgical cut area every day for signs of infection. Check for: ? Redness, swelling, or pain. ? Fluid or blood. ? Warmth. ? Pus or a bad smell. Activity  Do gentle, daily exercise as told by your doctor. You may be told to take short walks every day and go farther each time.  Do not lift anything that is heavier than 10 lb (4.5 kg), or the limit that your doctor tells you, until he or she says that it is safe.  Do not drive or use heavy machinery while taking prescription pain medicine.  Do not drive for 24 hours if you were given a medicine to help you relax (sedative).  Follow your doctor's advice about exercise, driving, and  general activities. Ask your doctor what activities are safe for you. Lifestyle  Do not douche, use tampons, or have sex for at least 6 weeks or as told by your doctor.  Do not drink alcohol until your doctor says it is okay.  Drink enough fluid to keep your pee (urine) clear or pale yellow.  Try to have someone at home with you for the first 1-2 weeks to help.  Do not use any products that contain nicotine or tobacco, such as cigarettes and e-cigarettes. These can slow down healing. If you need help quitting, ask your doctor. General instructions  Take over-the-counter and prescription medicines only as told by your doctor.  Do not take aspirin or ibuprofen. These medicines can cause bleeding.  To prevent or treat constipation while you are taking prescription pain medicine, your doctor may suggest that you: ? Drink enough fluid to keep your urine clear or pale yellow. ? Take over-the-counter or prescription medicines. ? Eat foods that are high in fiber, such as:  Fresh fruits and vegetables.  Whole grains.  Beans. ? Limit foods that are high in fat and processed sugars, such as fried and sweet foods.  Keep all follow-up visits as told by your doctor. This is important. Contact a doctor if:  You have chills or fever.  You have redness, swelling, or pain around your cut.  You have fluid or blood coming from your cut.  Your cut feels warm to the touch.  You have pus or a bad smell coming from your cut.  Your cut breaks open.  You feel dizzy or light-headed.  You have pain or bleeding when you pee.  You keep having watery poop (diarrhea).  You keep feeling sick to your stomach (nauseous) or keep throwing up (vomiting).  You have unusual fluid (discharge) coming from your vagina.  You have a rash.  You have a reaction to your medicine.  Your pain medicine does not help. Get help right away if:  You have a fever and your symptoms get worse all of a  sudden.  You have very bad belly (abdominal) pain.  You are short of breath.  You pass out (faint).  You have pain, swelling, or redness of your leg.  You bleed a lot from your vagina and notice clumps of blood (clots). Summary  Do not take baths, swim, or use a hot tub until your doctor says it is okay. Ask your doctor if you can take showers. You may only be allowed to take sponge baths for bathing.  Follow your doctor's advice about exercise, driving, and general activities. Ask your doctor what activities are safe for you.  Do not lift anything that is heavier than 10 lb (4.5 kg), or the limit that your doctor tells you, until he or she says that it is safe.  Try to have someone at home with you for the first 1-2 weeks to help. This information is not intended to replace advice given to you by your health care provider. Make sure you discuss any questions you have with your health care provider. Document Released: 05/16/2008 Document Revised: 07/26/2016 Document Reviewed: 07/26/2016 Elsevier Interactive Patient Education  2017 [employer].

## 2018-06-27 NOTE — Progress Notes (Signed)
Chief complaint: 1.  Symptomatic fibroid uterus 2.  Follow-up on pelvic ultrasound  Patient presents for surgical management planning.  She has a 12-week size fibroid uterus.  Conservative medical management has been unsuccessful in regulating her bleeding and pelvic pain.  In September patient bled 18 days total; in October she bled 12 days.  She is experiencing mild to moderate discomfort which is responding to 800 mg of Advil twice a day.  Patient has not had endometrial sampling recently.  Ultrasound today: Impression: 1. Slightly enlarged fibroid uterus. 2. Endometrium measures 34mm and appears trilaminar. 3. Right ovary appears WNL and contains a dominant follicle.  Left ovary not visualized. 4. Limited exam due to patient tolerance.  Recommendations: 1.Clinical correlation with the patient's History and Physical Exam. 2.  Recommend endometrial sampling  OBJECTIVE: BP 124/75   Pulse 84   Ht 5\' 5"  (1.651 m)   Wt 201 lb (91.2 kg)   LMP 06/10/2018 (Exact Date)   BMI 33.45 kg/m  Pleasant well-appearing female no acute distress.  Alert and oriented. Back: No CVA tenderness. Abdomen: Soft, nontender without organomegaly. Pelvic exam: Bimanual-cervix situated high in the vaginal vault; mild cervical motion tenderness; uterus is midplane, 10 weeks size, mobile, 1/4 tender  ASSESSMENT: 1.  Symptomatic fibroid uterus 12 weeks size 2.  Thickened endometrium on ultrasound 3.  Menorrhagia without recent endometrial sampling 4.  Desires surgical management  PLAN: 1.  TAH/BSO is scheduled 2.  Endometrial sampling will be done prior to surgery 3.  Patient will return for preop appointment the week before surgery 4.  Multiple patient questions are addressed.  A total of 15 minutes were spent face-to-face with the patient during this encounter and over half of that time dealt with counseling and coordination of care.  Brayton Mars, MD  Note: This dictation was  prepared with Dragon dictation along with smaller phrase technology. Any transcriptional errors that result from this process are unintentional.

## 2018-07-03 ENCOUNTER — Ambulatory Visit: Payer: Managed Care, Other (non HMO) | Admitting: Obstetrics and Gynecology

## 2018-07-03 ENCOUNTER — Encounter: Payer: Managed Care, Other (non HMO) | Admitting: Obstetrics and Gynecology

## 2018-07-03 ENCOUNTER — Encounter: Payer: Self-pay | Admitting: Obstetrics and Gynecology

## 2018-07-03 ENCOUNTER — Other Ambulatory Visit: Payer: Managed Care, Other (non HMO)

## 2018-07-03 ENCOUNTER — Other Ambulatory Visit (HOSPITAL_COMMUNITY)
Admission: RE | Admit: 2018-07-03 | Discharge: 2018-07-03 | Disposition: A | Discharge status: Home / Self Care | Payer: Managed Care, Other (non HMO) | Source: Ambulatory Visit | Attending: Obstetrics and Gynecology | Admitting: Obstetrics and Gynecology

## 2018-07-03 VITALS — BP 140/83 | Ht 65.0 in | Wt 202.0 lb

## 2018-07-03 DIAGNOSIS — N920 Excessive and frequent menstruation with regular cycle: Secondary | ICD-10-CM

## 2018-07-03 DIAGNOSIS — D259 Leiomyoma of uterus, unspecified: Secondary | ICD-10-CM | POA: Diagnosis not present

## 2018-07-03 NOTE — Patient Instructions (Signed)

## 2018-07-03 NOTE — Progress Notes (Signed)
Chief complaint: 1.  Menorrhagia 2.  Fibroid uterus, pending hysterectomy  Patient presents for endometrial biopsy prior to surgical management of 12-week fibroid uterus which has been unresponsive to conservative medical therapy to regulate bleeding and pain. Recent ultrasound demonstrated a thickened endometrium measuring 16 mm.  She has history of bleeding greater than 12 days in duration.  No recent endometrial sampling.  OBJECTIVE: BP 140/83   Ht 5\' 5"  (1.651 m)   Wt 202 lb (91.6 kg)   LMP 06/10/2018 (Exact Date)   BMI 33.61 kg/m  Pleasant female in no acute distress.  Slightly anxious. Abdomen: Soft, nontender Pelvic exam: Anthropoid pelvis Bimanual-midplane uterus, top normal size, slightly tender  PROCEDURE:Endometrial Biopsy Procedure Note  Pre-operative Diagnosis: Menorrhagia  Post-operative Diagnosis: same  Indications: abnormal uterine bleeding  Procedure Details   Urine pregnancy test was done  and result was negative.  The risks (including infection, bleeding, pain, and uterine perforation) and benefits of the procedure were explained to the patient and Verbal informed consent was obtained.  Antibiotic prophylaxis against endocarditis was not indicated.   The patient was placed in the dorsal lithotomy position.  Bimanual exam showed the uterus to be in the neutral position.  A Peterson speculum inserted in the vagina.  Endocervical curettage with a Kevorkian curette was not performed.   A sharp tenaculum was applied to the anterior lip of the cervix for stabilization.  A sterile uterine sound was used to sound the uterus to a depth of 10 cm.  A Mylex 25mm curette was used to sample the endometrium.  Sample was sent for pathologic examination.  Condition: Stable  Complications: None  Plan:  The patient was advised to call for any fever or for prolonged or severe pain or bleeding. She was advised to use OTC acetaminophen and OTC ibuprofen as needed for mild to  moderate pain. She was advised to avoid vaginal intercourse for 48 hours or until the bleeding has completely stopped.  Attending Physician Documentation: Brayton Mars, MD   ASSESSMENT: 1.  12-week fibroid uterus 2.  Menorrhagia 3.  Status post successful endometrial biopsy today  PLAN: 1.  Endometrial biopsy 2.  Return for preop appointment prior surgery  Brayton Mars, MD  Note: This dictation was prepared with Dragon dictation along with smaller phrase technology. Any transcriptional errors that result from this process are unintentional.

## 2018-07-03 NOTE — Addendum Note (Signed)
Addended by: Elouise Munroe on: 07/03/2018 02:46 PM   Modules accepted: Orders

## 2018-07-15 ENCOUNTER — Ambulatory Visit: Payer: Self-pay | Admitting: Psychiatry

## 2018-07-22 ENCOUNTER — Telehealth: Payer: Self-pay | Admitting: Cardiovascular Disease

## 2018-07-22 NOTE — Telephone Encounter (Signed)
Patient calling  States that for insurance purposes she will need a letter stating that she does not have a certain condition, states she will be able to come by office and pick up letter Please call to discuss

## 2018-07-23 NOTE — Telephone Encounter (Signed)
I spoke with the patient. She states that she was denied [employer] as part of her medical record said she has mitral valve prolapse.  She states she was told for years that she had this, but that Dr. Rockey Situ advised that she did not have this.  She is needing a letter from him stating that she does not have MVP.  I advised I will review with Dr. Rockey Situ and we will call her when her letter is ready.

## 2018-07-30 ENCOUNTER — Encounter: Payer: Self-pay | Admitting: Obstetrics and Gynecology

## 2018-07-30 ENCOUNTER — Other Ambulatory Visit: Payer: Self-pay

## 2018-07-30 ENCOUNTER — Encounter: Payer: Self-pay | Admitting: *Deleted

## 2018-07-30 ENCOUNTER — Encounter
Admission: RE | Admit: 2018-07-30 | Discharge: 2018-07-30 | Disposition: A | Discharge status: Home / Self Care | Payer: Managed Care, Other (non HMO) | Source: Ambulatory Visit | Attending: Obstetrics and Gynecology | Admitting: Obstetrics and Gynecology

## 2018-07-30 ENCOUNTER — Ambulatory Visit (INDEPENDENT_AMBULATORY_CARE_PROVIDER_SITE_OTHER): Payer: Managed Care, Other (non HMO) | Admitting: Obstetrics and Gynecology

## 2018-07-30 VITALS — BP 137/84 | HR 71 | Ht 65.0 in | Wt 203.9 lb

## 2018-07-30 DIAGNOSIS — Z01818 Encounter for other preprocedural examination: Secondary | ICD-10-CM

## 2018-07-30 DIAGNOSIS — N920 Excessive and frequent menstruation with regular cycle: Secondary | ICD-10-CM

## 2018-07-30 DIAGNOSIS — D259 Leiomyoma of uterus, unspecified: Secondary | ICD-10-CM

## 2018-07-30 DIAGNOSIS — Z98891 History of uterine scar from previous surgery: Secondary | ICD-10-CM

## 2018-07-30 DIAGNOSIS — N946 Dysmenorrhea, unspecified: Secondary | ICD-10-CM

## 2018-07-30 HISTORY — DX: Personal history of other diseases of the digestive system: Z87.19

## 2018-07-30 HISTORY — DX: Anemia, unspecified: D64.9

## 2018-07-30 HISTORY — DX: Essential (primary) hypertension: I10

## 2018-07-30 LAB — CBC WITH DIFFERENTIAL/PLATELET
Abs Immature Granulocytes: 0.02 10*3/uL (ref 0.00–0.07)
Basophils Absolute: 0 10*3/uL (ref 0.0–0.1)
Basophils Relative: 0 %
Eosinophils Absolute: 0.3 10*3/uL (ref 0.0–0.5)
Eosinophils Relative: 6 %
HCT: 36.3 % (ref 36.0–46.0)
Hemoglobin: 10.8 g/dL — ABNORMAL LOW (ref 12.0–15.0)
Immature Granulocytes: 0 %
Lymphocytes Relative: 44 %
Lymphs Abs: 2.2 10*3/uL (ref 0.7–4.0)
MCH: 22.4 pg — ABNORMAL LOW (ref 26.0–34.0)
MCHC: 29.8 g/dL — ABNORMAL LOW (ref 30.0–36.0)
MCV: 75.3 fL — ABNORMAL LOW (ref 80.0–100.0)
MONOS PCT: 9 %
Monocytes Absolute: 0.4 10*3/uL (ref 0.1–1.0)
Neutro Abs: 2 10*3/uL (ref 1.7–7.7)
Neutrophils Relative %: 41 %
Platelets: 449 10*3/uL — ABNORMAL HIGH (ref 150–400)
RBC: 4.82 MIL/uL (ref 3.87–5.11)
RDW: 17.5 % — ABNORMAL HIGH (ref 11.5–15.5)
WBC: 4.9 10*3/uL (ref 4.0–10.5)
nRBC: 0 % (ref 0.0–0.2)

## 2018-07-30 LAB — TYPE AND SCREEN
ABO/RH(D): A POS
Antibody Screen: NEGATIVE

## 2018-07-30 NOTE — Progress Notes (Signed)
PREOPERATIVE HISTORY AND PHYSICAL  Date of surgery: 08/05/2018 Diagnosis: Symptomatic uterine fibroids (dysmenorrhea, menorrhagia) Procedure: TAH/BSO   Patient is a 52 y.o. G5P2181female scheduled definitive surgical management of symptomatic uterine fibroids-12 weeks size.  Patient has long history of menorrhagia and dysmenorrhea refractory to medical therapy.  Preoperative endometrial biopsy on 07/03/2018 is consistent with secretory endometrium without hyperplasia or carcinoma  Menorrhagia lasts 5-7 days and is heavy with clots. She is on iron for iron deficiency anemia.  Dysmenorrhea is severe; it starts as left-sided and low back ache with radiation into the buttocks and down into the legs. The patient reports taking up to 12 Advil a day for her pain as well as using a heating pad She does not  have dyspareunia.  Status post cesarean section x2 History of HSV-2.  ULTRASOUND REPORT  Location: [facility] Date of Service:  06/27/2018  Indications: DUB w/ fibroids Findings:   ** Very limited exam due to patient tolerance (pain). **  The uterus measures 11.5 x 6.8 x 6.7 cm. Echo texture is heterogeneous with evidence of focal masses. Within the uterus are multiple suspected fibroids measuring: Fibroid 1:  Anterior fundal, subserosal, 2.1 x 2.2 x 2.5 cm.  The Endometrium measures 16 mm and appears trilaminar.  Right Ovary measures 4.1 x 2.9 x 2.6 cm and contains a dominant follicle that measures 2.1 x 1.9 x 2.0. It is normal in appearance. Left Ovary was not visualized. Survey of the adnexa demonstrates no adnexal masses. There is no free fluid in the cul de sac.  Impression: 1. Slightly enlarged fibroid uterus. 2. Endometrium measures 41mm and appears trilaminar. 3. Right ovary appears WNL and contains a dominant follicle.  Left ovary not visualized. 4. Limited exam due to patient tolerance.  Recommendations: 1.Clinical correlation with the patient's  History and Physical Exam. 2.  Recommend endometrial sampling  Dario Ave, RDMS Brayton Mars, MD   OB History    Gravida  5   Para  3   Term  2   Preterm  1   AB  2   Living  2     SAB      TAB  2   Ectopic      Multiple      Live Births  2        Obstetric Comments  1st Menstrual Cycle:  16 1st Pregnancy:  17        Patient's last menstrual period was 06/24/2018.    Past Medical History:  Diagnosis Date  . A-fib ([place])   . Anxiety   . Arthritis   . B12 deficiency   . Chicken pox   . Depression   . Fibroid   . GERD (gastroesophageal reflux disease)   . Migraines     Past Surgical History:  Procedure Laterality Date  . APPENDECTOMY  5/11  . C-spine surgery    . CESAREAN SECTION  10/98,9/12  . TONSILLECTOMY  03/2008  . TUBAL LIGATION      OB History  Gravida Para Term Preterm AB Living  5 3 2 1 2 2   SAB TAB Ectopic Multiple Live Births    2     2    # Outcome Date GA Lbr Len/2nd Weight Sex Delivery Anes PTL Lv  5 Term 2012   6 lb 1.9 oz (2.776 kg) F CS-LTranv   LIV  4 Preterm 2010        FD  3 Term 1998  7 lb 2.1 oz (3.234 kg) M CS-LTranv   LIV  2 TAB           1 TAB             Obstetric Comments  1st Menstrual Cycle:  16  1st Pregnancy:  17    Social History   Socioeconomic History  . Marital status: Married    Spouse name: Not on file  . Number of children: Not on file  . Years of education: Not on file  . Highest education level: Not on file  Occupational History  . Not on file  Social Needs  . Financial resource strain: Not on file  . Food insecurity:    Worry: Not on file    Inability: Not on file  . Transportation needs:    Medical: Not on file    Non-medical: Not on file  Tobacco Use  . Smoking status: Former Smoker    Packs/day: 1.00    Years: 10.00    Pack years: 10.00    Types: Cigarettes    Last attempt to quit: 08/22/1991    Years since quitting: 26.9  . Smokeless tobacco: Never Used   Substance and Sexual Activity  . Alcohol use: Yes    Alcohol/week: 0.0 standard drinks    Comment: twice a year   . Drug use: No  . Sexual activity: Yes    Partners: Male    Birth control/protection: Surgical    Comment: Husband  Lifestyle  . Physical activity:    Days per week: Not on file    Minutes per session: Not on file  . Stress: Not on file  Relationships  . Social connections:    Talks on phone: Not on file    Gets together: Not on file    Attends religious service: Not on file    Active member of club or organization: Not on file    Attends meetings of clubs or organizations: Not on file    Relationship status: Not on file  Other Topics Concern  . Not on file  Social History Narrative   [employer]    Lives with husband and 2 children    Son- 31 yo and daughter 53 yo    Pets: 2 dogs    Caffeine- 2 sodas 20 oz, coffee 1 cup occasionally    Enjoys being outside with family     Family History  Problem Relation Age of Onset  . Diabetes Mother   . Alcohol abuse Father   . Arthritis Father   . Heart disease Father   . Arthritis Sister   . Alcohol abuse Maternal Aunt   . Alcohol abuse Maternal Uncle   . Alcohol abuse Paternal Grandfather   . Arthritis Maternal Grandmother   . Schizophrenia Maternal Grandmother   . Breast cancer Neg Hx      (Not in a hospital admission)  Allergies  Allergen Reactions  . Phenylmercuric Nitrate Rash  . Augmentin [Amoxicillin-Pot Clavulanate] Nausea Only    Has patient had a PCN reaction causing immediate rash, facial/tongue/throat swelling, SOB or lightheadedness with hypotension: No Has patient had a PCN reaction causing severe rash involving mucus membranes or skin necrosis: No Has patient had a PCN reaction that required hospitalization: No Has patient had a PCN reaction occurring within the last 10 years: Yes If all of the above answers are "NO", then may proceed with Cephalosporin  use.   . Codeine  Nausea Only  . Diclofenac Nausea Only  . Latex Rash  . Neomycin Rash  . Percocet [Oxycodone-Acetaminophen] Rash    Review of Systems Constitutional: No recent fever/chills/sweats Respiratory: No recent cough/bronchitis Cardiovascular: No chest pain Gastrointestinal: No recent nausea/vomiting/diarrhea Genitourinary: No UTI symptoms Hematologic/lymphatic:No history of coagulopathy or recent blood thinner use    Objective:    BP 137/84   Pulse 71   Ht 5\' 5"  (1.651 m)   Wt 203 lb 14.4 oz (92.5 kg)   LMP 06/24/2018   BMI 33.93 kg/m   General:   Normal  Skin:   normal  HEENT:  Normal  Neck:  Supple without Adenopathy or Thyromegaly  Lungs:   Heart:              Breasts:   Abdomen:  Pelvis:  M/S   Extremeties:  Neuro:    clear to auscultation bilaterally   Normal without murmur   Not Examined   soft, non-tender; bowel sounds normal; no masses,  no organomegaly   Exam deferred to OR  No CVAT  Warm/Dry   Normal          Assessment:     12-week fibroid uterus, symptomatic with menorrhagia and dysmenorrhea      Plan:  TAH/BSO  Preop counseling: Patient is to undergo TAH/BSO for management of symptomatic 12-week fibroid uterus.  She is understanding of the planned procedures and is aware of and is accepting of all surgical risks which include but are not limited to bleeding, infection, pelvic organ injury with need for repair, blood clot disorders, anesthesia risk, etc.  All questions have been answered.  Informed consent is given.  Patient is ready willing to proceed with surgery as scheduled.  Brayton Mars, MD  Note: This dictation was prepared with Dragon dictation along with smaller phrase technology. Any transcriptional errors that result from this process are unintentional.

## 2018-07-30 NOTE — Patient Instructions (Signed)
Your procedure is scheduled on: Monday, August 05, 2018 Report to Day Surgery on the 2nd floor of the [facility]. To find out your arrival time, please call [phone] between 1PM - 3PM on: Friday, August 02, 2018  REMEMBER: Instructions that are not followed completely may result in serious medical risk, up to and including death; or upon the discretion of your surgeon and anesthesiologist your surgery may need to be rescheduled.  Do not eat food after midnight the night before surgery.  No gum chewing, lozengers or hard candies.  You may however, drink CLEAR liquids up to 2 hours before you are scheduled to arrive for your surgery. Do not drink anything within 2 hours of the start of your surgery.  Clear liquids include: - water  - apple juice without pulp - gatorade - black coffee or tea (Do NOT add milk or creamers to the coffee or tea) Do NOT drink anything that is not on this list.  Type 1 and Type 2 diabetics should only drink water.  ENSURE PRE-SURGERY CARBOHYDRATE DRINK:  Complete drinking prior to leaving for the hospital. Finish 3 hours prior to surgery.  No Alcohol for 24 hours before or after surgery.  No Smoking including e-cigarettes for 24 hours prior to surgery.  No chewable tobacco products for at least 6 hours prior to surgery.  No nicotine patches on the day of surgery.  On the morning of surgery brush your teeth with toothpaste and water, you may rinse your mouth with mouthwash if you wish. Do not swallow any toothpaste or mouthwash.  Notify your doctor if there is any change in your medical condition (cold, fever, infection).  Do not wear jewelry, make-up, hairpins, clips or nail polish.  Do not wear lotions, powders, or perfumes.   Do not shave 48 hours prior to surgery.   Contacts and dentures may not be worn into surgery.  Do not bring valuables to the hospital, including drivers license, insurance or credit cards.   is not  responsible for any belongings or valuables.   TAKE THESE MEDICATIONS THE MORNING OF SURGERY:  1. Clonazepam (if needed for anxiety) 2. Omeprazole  Use CHG Soap as directed on instruction sheet.  NOW!  Stop aspirin and Anti-inflammatories (NSAIDS) such as Advil, Aleve, Ibuprofen, Motrin, Naproxen, Naprosyn and Aspirin based products such as Excedrin, Goodys Powder, BC Powder. (May take Tylenol or Acetaminophen if needed.)  NOW!  Stop ANY OVER THE COUNTER supplements until after surgery. (May continue Vitamin D, Vitamin B, and multivitamin.)  Wear comfortable clothing (specific to your surgery type) to the hospital.  Plan for stool softeners for home use.  If you are being admitted to the hospital overnight, leave your suitcase in the car. After surgery it may be brought to your room.  Please call [phone] if you have any questions about these instructions.

## 2018-07-30 NOTE — H&P (Addendum)
PREOPERATIVE HISTORY AND PHYSICAL  Date of surgery: 08/05/2018 Diagnosis: Symptomatic uterine fibroids (dysmenorrhea, menorrhagia) Procedure: TAH/BSO   Patient is a 52 y.o. G5P2180female scheduled definitive surgical management of symptomatic uterine fibroids-12 weeks size.  Patient has long history of menorrhagia and dysmenorrhea refractory to medical therapy.  Preoperative endometrial biopsy on 07/03/2018 is consistent with secretory endometrium without hyperplasia or carcinoma  Menorrhagia lasts 5-7 days and is heavy with clots. She is on iron for iron deficiency anemia.  Dysmenorrhea is severe; it starts as left-sided and low back ache with radiation into the buttocks and down into the legs. The patient reports taking up to 12 Advil a day for her pain as well as using a heating pad She does not  have dyspareunia.  History of C-section x2 History of HSV-2.  ULTRASOUND REPORT  Location: [facility] Date of Service:  06/27/2018  Indications: DUB w/ fibroids Findings:   ** Very limited exam due to patient tolerance (pain). **  The uterus measures 11.5 x 6.8 x 6.7 cm. Echo texture is heterogeneous with evidence of focal masses. Within the uterus are multiple suspected fibroids measuring: Fibroid 1:  Anterior fundal, subserosal, 2.1 x 2.2 x 2.5 cm.  The Endometrium measures 16 mm and appears trilaminar.  Right Ovary measures 4.1 x 2.9 x 2.6 cm and contains a dominant follicle that measures 2.1 x 1.9 x 2.0. It is normal in appearance. Left Ovary was not visualized. Survey of the adnexa demonstrates no adnexal masses. There is no free fluid in the cul de sac.  Impression: 1. Slightly enlarged fibroid uterus. 2. Endometrium measures 49mm and appears trilaminar. 3. Right ovary appears WNL and contains a dominant follicle.  Left ovary not visualized. 4. Limited exam due to patient tolerance.  Recommendations: 1.Clinical correlation with the patient's History  and Physical Exam. 2.  Recommend endometrial sampling  Dario Ave, RDMS Brayton Mars, MD   OB History    Gravida  5   Para  3   Term  2   Preterm  1   AB  2   Living  2     SAB      TAB  2   Ectopic      Multiple      Live Births  2        Obstetric Comments  1st Menstrual Cycle:  16 1st Pregnancy:  17        Patient's last menstrual period was 06/24/2018.    Past Medical History:  Diagnosis Date  . A-fib ([place])   . Anxiety   . Arthritis   . B12 deficiency   . Chicken pox   . Depression   . Fibroid   . GERD (gastroesophageal reflux disease)   . Migraines     Past Surgical History:  Procedure Laterality Date  . APPENDECTOMY  5/11  . C-spine surgery    . CESAREAN SECTION  10/98,9/12  . TONSILLECTOMY  03/2008  . TUBAL LIGATION      OB History  Gravida Para Term Preterm AB Living  5 3 2 1 2 2   SAB TAB Ectopic Multiple Live Births    2     2    # Outcome Date GA Lbr Len/2nd Weight Sex Delivery Anes PTL Lv  5 Term 2012   6 lb 1.9 oz (2.776 kg) F CS-LTranv   LIV  4 Preterm 2010        FD  3 Term 1998   7  lb 2.1 oz (3.234 kg) M CS-LTranv   LIV  2 TAB           1 TAB             Obstetric Comments  1st Menstrual Cycle:  16  1st Pregnancy:  17    Social History   Socioeconomic History  . Marital status: Married    Spouse name: Not on file  . Number of children: Not on file  . Years of education: Not on file  . Highest education level: Not on file  Occupational History  . Not on file  Social Needs  . Financial resource strain: Not on file  . Food insecurity:    Worry: Not on file    Inability: Not on file  . Transportation needs:    Medical: Not on file    Non-medical: Not on file  Tobacco Use  . Smoking status: Former Smoker    Packs/day: 1.00    Years: 10.00    Pack years: 10.00    Types: Cigarettes    Last attempt to quit: 08/22/1991    Years since quitting: 26.9  . Smokeless tobacco: Never Used  Substance and  Sexual Activity  . Alcohol use: Yes    Alcohol/week: 0.0 standard drinks    Comment: twice a year   . Drug use: No  . Sexual activity: Yes    Partners: Male    Birth control/protection: Surgical    Comment: Husband  Lifestyle  . Physical activity:    Days per week: Not on file    Minutes per session: Not on file  . Stress: Not on file  Relationships  . Social connections:    Talks on phone: Not on file    Gets together: Not on file    Attends religious service: Not on file    Active member of club or organization: Not on file    Attends meetings of clubs or organizations: Not on file    Relationship status: Not on file  Other Topics Concern  . Not on file  Social History Narrative   [employer]    Lives with husband and 2 children    Son- 42 yo and daughter 60 yo    Pets: 2 dogs    Caffeine- 2 sodas 20 oz, coffee 1 cup occasionally    Enjoys being outside with family     Family History  Problem Relation Age of Onset  . Diabetes Mother   . Alcohol abuse Father   . Arthritis Father   . Heart disease Father   . Arthritis Sister   . Alcohol abuse Maternal Aunt   . Alcohol abuse Maternal Uncle   . Alcohol abuse Paternal Grandfather   . Arthritis Maternal Grandmother   . Schizophrenia Maternal Grandmother   . Breast cancer Neg Hx      (Not in a hospital admission)  Allergies  Allergen Reactions  . Phenylmercuric Nitrate Rash  . Augmentin [Amoxicillin-Pot Clavulanate] Nausea Only    Has patient had a PCN reaction causing immediate rash, facial/tongue/throat swelling, SOB or lightheadedness with hypotension: No Has patient had a PCN reaction causing severe rash involving mucus membranes or skin necrosis: No Has patient had a PCN reaction that required hospitalization: No Has patient had a PCN reaction occurring within the last 10 years: Yes If all of the above answers are "NO", then may proceed with Cephalosporin use.   . Codeine  Nausea  Only  . Diclofenac Nausea Only  . Latex Rash  . Neomycin Rash  . Percocet [Oxycodone-Acetaminophen] Rash    Review of Systems Constitutional: No recent fever/chills/sweats Respiratory: No recent cough/bronchitis Cardiovascular: No chest pain Gastrointestinal: No recent nausea/vomiting/diarrhea Genitourinary: No UTI symptoms Hematologic/lymphatic:No history of coagulopathy or recent blood thinner use    Objective:    BP 137/84   Pulse 71   Ht 5\' 5"  (1.651 m)   Wt 203 lb 14.4 oz (92.5 kg)   LMP 06/24/2018   BMI 33.93 kg/m   General:   Normal  Skin:   normal  HEENT:  Normal  Neck:  Supple without Adenopathy or Thyromegaly  Lungs:   Heart:              Breasts:   Abdomen:  Pelvis:  M/S   Extremeties:  Neuro:    clear to auscultation bilaterally   Normal without murmur   Not Examined   soft, non-tender; bowel sounds normal; no masses,  no organomegaly   Exam deferred to OR  No CVAT  Warm/Dry   Normal          Assessment:     12-week fibroid uterus, symptomatic with menorrhagia and dysmenorrhea      Plan:  TAH/BSO  Preop counseling: Patient is to undergo TAH/BSO for management of symptomatic 12-week fibroid uterus.  She is understanding of the planned procedures and is aware of and is accepting of all surgical risks which include but are not limited to bleeding, infection, pelvic organ injury with need for repair, blood clot disorders, anesthesia risk, etc.  All questions have been answered.  Informed consent is given.  Patient is ready willing to proceed with surgery as scheduled.  Brayton Mars, MD  Note: This dictation was prepared with Dragon dictation along with smaller phrase technology. Any transcriptional errors that result from this process are unintentional.

## 2018-07-30 NOTE — H&P (View-Only) (Signed)
PREOPERATIVE HISTORY AND PHYSICAL  Date of surgery: 08/05/2018 Diagnosis: Symptomatic uterine fibroids (dysmenorrhea, menorrhagia) Procedure: TAH/BSO   Patient is a 52 y.o. [id number] scheduled definitive surgical management of symptomatic uterine fibroids-12 weeks size.  Patient has long history of menorrhagia and dysmenorrhea refractory to medical therapy.  Preoperative endometrial biopsy on 07/03/2018 is consistent with secretory endometrium without hyperplasia or carcinoma  Menorrhagia lasts 5-7 days and is heavy with clots. She is on iron for iron deficiency anemia.  Dysmenorrhea is severe; it starts as left-sided and low back ache with radiation into the buttocks and down into the legs. The patient reports taking up to 12 Advil a day for her pain as well as using a heating pad She does not  have dyspareunia.  History of C-section x2 History of HSV-2.  ULTRASOUND REPORT  Location: [facility] Date of Service:  06/27/2018  Indications: DUB w/ fibroids Findings:   ** Very limited exam due to patient tolerance (pain). **  The uterus measures 11.5 x 6.8 x 6.7 cm. Echo texture is heterogeneous with evidence of focal masses. Within the uterus are multiple suspected fibroids measuring: Fibroid 1:  Anterior fundal, subserosal, 2.1 x 2.2 x 2.5 cm.  The Endometrium measures 16 mm and appears trilaminar.  Right Ovary measures 4.1 x 2.9 x 2.6 cm and contains a dominant follicle that measures 2.1 x 1.9 x 2.0. It is normal in appearance. Left Ovary was not visualized. Survey of the adnexa demonstrates no adnexal masses. There is no free fluid in the cul de sac.  Impression: 1. Slightly enlarged fibroid uterus. 2. Endometrium measures 13mm and appears trilaminar. 3. Right ovary appears WNL and contains a dominant follicle.  Left ovary not visualized. 4. Limited exam due to patient tolerance.  Recommendations: 1.Clinical correlation with the patient's History  and Physical Exam. 2.  Recommend endometrial sampling  Dario Ave, RDMS Brayton Mars, MD   OB History    Gravida  5   Para  3   Term  2   Preterm  1   AB  2   Living  2     SAB      TAB  2   Ectopic      Multiple      Live Births  2        Obstetric Comments  1st Menstrual Cycle:  16 1st Pregnancy:  17        Patient's last menstrual period was 06/24/2018.    Past Medical History:  Diagnosis Date  . A-fib ([place])   . Anxiety   . Arthritis   . B12 deficiency   . Chicken pox   . Depression   . Fibroid   . GERD (gastroesophageal reflux disease)   . Migraines     Past Surgical History:  Procedure Laterality Date  . APPENDECTOMY  5/11  . C-spine surgery    . CESAREAN SECTION  10/98,9/12  . TONSILLECTOMY  03/2008  . TUBAL LIGATION      OB History  Gravida Para Term Preterm AB Living  5 3 2 1 2 2   SAB TAB Ectopic Multiple Live Births    2     2    # Outcome Date GA Lbr Len/2nd Weight Sex Delivery Anes PTL Lv  5 Term 2012   6 lb 1.9 oz (2.776 kg) F CS-LTranv   LIV  4 Preterm 2010        FD  3 Term 1998   7  lb 2.1 oz (3.234 kg) M CS-LTranv   LIV  2 TAB           1 TAB             Obstetric Comments  1st Menstrual Cycle:  16  1st Pregnancy:  17    Social History   Socioeconomic History  . Marital status: Married    Spouse name: Not on file  . Number of children: Not on file  . Years of education: Not on file  . Highest education level: Not on file  Occupational History  . Not on file  Social Needs  . Financial resource strain: Not on file  . Food insecurity:    Worry: Not on file    Inability: Not on file  . Transportation needs:    Medical: Not on file    Non-medical: Not on file  Tobacco Use  . Smoking status: Former Smoker    Packs/day: 1.00    Years: 10.00    Pack years: 10.00    Types: Cigarettes    Last attempt to quit: 08/22/1991    Years since quitting: 26.9  . Smokeless tobacco: Never Used  Substance and  Sexual Activity  . Alcohol use: Yes    Alcohol/week: 0.0 standard drinks    Comment: twice a year   . Drug use: No  . Sexual activity: Yes    Partners: Male    Birth control/protection: Surgical    Comment: Husband  Lifestyle  . Physical activity:    Days per week: Not on file    Minutes per session: Not on file  . Stress: Not on file  Relationships  . Social connections:    Talks on phone: Not on file    Gets together: Not on file    Attends religious service: Not on file    Active member of club or organization: Not on file    Attends meetings of clubs or organizations: Not on file    Relationship status: Not on file  Other Topics Concern  . Not on file  Social History Narrative   [employer]    Lives with husband and 2 children    Son- 16 yo and daughter 65 yo    Pets: 2 dogs    Caffeine- 2 sodas 20 oz, coffee 1 cup occasionally    Enjoys being outside with family     Family History  Problem Relation Age of Onset  . Diabetes Mother   . Alcohol abuse Father   . Arthritis Father   . Heart disease Father   . Arthritis Sister   . Alcohol abuse Maternal Aunt   . Alcohol abuse Maternal Uncle   . Alcohol abuse Paternal Grandfather   . Arthritis Maternal Grandmother   . Schizophrenia Maternal Grandmother   . Breast cancer Neg Hx      (Not in a hospital admission)  Allergies  Allergen Reactions  . Phenylmercuric Nitrate Rash  . Augmentin [Amoxicillin-Pot Clavulanate] Nausea Only    Has patient had a PCN reaction causing immediate rash, facial/tongue/throat swelling, SOB or lightheadedness with hypotension: No Has patient had a PCN reaction causing severe rash involving mucus membranes or skin necrosis: No Has patient had a PCN reaction that required hospitalization: No Has patient had a PCN reaction occurring within the last 10 years: Yes If all of the above answers are "NO", then may proceed with Cephalosporin use.   . Codeine  Nausea  Only  . Diclofenac Nausea Only  . Latex Rash  . Neomycin Rash  . Percocet [Oxycodone-Acetaminophen] Rash    Review of Systems Constitutional: No recent fever/chills/sweats Respiratory: No recent cough/bronchitis Cardiovascular: No chest pain Gastrointestinal: No recent nausea/vomiting/diarrhea Genitourinary: No UTI symptoms Hematologic/lymphatic:No history of coagulopathy or recent blood thinner use    Objective:    BP 137/84   Pulse 71   Ht 5\' 5"  (1.651 m)   Wt 203 lb 14.4 oz (92.5 kg)   LMP 06/24/2018   BMI 33.93 kg/m   General:   Normal  Skin:   normal  HEENT:  Normal  Neck:  Supple without Adenopathy or Thyromegaly  Lungs:   Heart:              Breasts:   Abdomen:  Pelvis:  M/S   Extremeties:  Neuro:    clear to auscultation bilaterally   Normal without murmur   Not Examined   soft, non-tender; bowel sounds normal; no masses,  no organomegaly   Exam deferred to OR  No CVAT  Warm/Dry   Normal          Assessment:     12-week fibroid uterus, symptomatic with menorrhagia and dysmenorrhea      Plan:  TAH/BSO  Preop counseling: Patient is to undergo TAH/BSO for management of symptomatic 12-week fibroid uterus.  She is understanding of the planned procedures and is aware of and is accepting of all surgical risks which include but are not limited to bleeding, infection, pelvic organ injury with need for repair, blood clot disorders, anesthesia risk, etc.  All questions have been answered.  Informed consent is given.  Patient is ready willing to proceed with surgery as scheduled.  Brayton Mars, MD  Note: This dictation was prepared with Dragon dictation along with smaller phrase technology. Any transcriptional errors that result from this process are unintentional.

## 2018-07-30 NOTE — Telephone Encounter (Signed)
Letter done- patient aware this has been placed at the front desk for pick up.

## 2018-07-30 NOTE — Patient Instructions (Signed)
1.  Return for 1 week postop check   Abdominal Hysterectomy, Care After This sheet gives you information about how to care for yourself after your procedure. Your doctor may also give you more specific instructions. If you have problems or questions, contact your doctor. Follow these instructions at home: Bathing  Do not take baths, swim, or use a hot tub until your doctor says it is okay. Ask your doctor if you can take showers. You may only be allowed to take sponge baths for bathing.  Keep the bandage (dressing) dry until your doctor says it can be taken off. Surgical cut ( incision) care  Follow instructions from your doctor about how to take care of your cut from surgery. Make sure you: ? Wash your hands with soap and water before you change your bandage (dressing). If you cannot use soap and water, use hand sanitizer. ? Change your bandage as told by your doctor. ? Leave stitches (sutures), skin glue, or skin tape (adhesive) strips in place. They may need to stay in place for 2 weeks or longer. If tape strips get loose and curl up, you may trim the loose edges. Do not remove tape strips completely unless your doctor says it is okay.  Check your surgical cut area every day for signs of infection. Check for: ? Redness, swelling, or pain. ? Fluid or blood. ? Warmth. ? Pus or a bad smell. Activity  Do gentle, daily exercise as told by your doctor. You may be told to take short walks every day and go farther each time.  Do not lift anything that is heavier than 10 lb (4.5 kg), or the limit that your doctor tells you, until he or she says that it is safe.  Do not drive or use heavy machinery while taking prescription pain medicine.  Do not drive for 24 hours if you were given a medicine to help you relax (sedative).  Follow your doctor's advice about exercise, driving, and general activities. Ask your doctor what activities are safe for you. Lifestyle  Do not douche, use tampons,  or have sex for at least 6 weeks or as told by your doctor.  Do not drink alcohol until your doctor says it is okay.  Drink enough fluid to keep your pee (urine) clear or pale yellow.  Try to have someone at home with you for the first 1-2 weeks to help.  Do not use any products that contain nicotine or tobacco, such as cigarettes and e-cigarettes. These can slow down healing. If you need help quitting, ask your doctor. General instructions  Take over-the-counter and prescription medicines only as told by your doctor.  Do not take aspirin or ibuprofen. These medicines can cause bleeding.  To prevent or treat constipation while you are taking prescription pain medicine, your doctor may suggest that you: ? Drink enough fluid to keep your urine clear or pale yellow. ? Take over-the-counter or prescription medicines. ? Eat foods that are high in fiber, such as:  Fresh fruits and vegetables.  Whole grains.  Beans. ? Limit foods that are high in fat and processed sugars, such as fried and sweet foods.  Keep all follow-up visits as told by your doctor. This is important. Contact a doctor if:  You have chills or fever.  You have redness, swelling, or pain around your cut.  You have fluid or blood coming from your cut.  Your cut feels warm to the touch.  You have pus or a  bad smell coming from your cut.  Your cut breaks open.  You feel dizzy or light-headed.  You have pain or bleeding when you pee.  You keep having watery poop (diarrhea).  You keep feeling sick to your stomach (nauseous) or keep throwing up (vomiting).  You have unusual fluid (discharge) coming from your vagina.  You have a rash.  You have a reaction to your medicine.  Your pain medicine does not help. Get help right away if:  You have a fever and your symptoms get worse all of a sudden.  You have very bad belly (abdominal) pain.  You are short of breath.  You pass out (faint).  You have  pain, swelling, or redness of your leg.  You bleed a lot from your vagina and notice clumps of blood (clots). Summary  Do not take baths, swim, or use a hot tub until your doctor says it is okay. Ask your doctor if you can take showers. You may only be allowed to take sponge baths for bathing.  Follow your doctor's advice about exercise, driving, and general activities. Ask your doctor what activities are safe for you.  Do not lift anything that is heavier than 10 lb (4.5 kg), or the limit that your doctor tells you, until he or she says that it is safe.  Try to have someone at home with you for the first 1-2 weeks to help. This information is not intended to replace advice given to you by your health care provider. Make sure you discuss any questions you have with your health care provider. Document Released: 05/16/2008 Document Revised: 07/26/2016 Document Reviewed: 07/26/2016 Elsevier Interactive Patient Education  2017 [employer].

## 2018-07-30 NOTE — Telephone Encounter (Signed)
We can write a letter in support of that Echocardiogram 2014,  no mention of mitral valve prolapse noted

## 2018-07-31 LAB — HIV ANTIBODY (ROUTINE TESTING W REFLEX): HIV Screen 4th Generation wRfx: NONREACTIVE

## 2018-07-31 LAB — RPR: RPR Ser Ql: NONREACTIVE

## 2018-08-01 ENCOUNTER — Telehealth: Payer: Self-pay | Admitting: Psychiatry

## 2018-08-01 NOTE — Telephone Encounter (Signed)
Patient requesting a letter for denial of life insurance. Stated she was denied for bipolar dx. Would like to speak with you concerning this matter. Chart placed in your box.

## 2018-08-02 NOTE — Telephone Encounter (Signed)
Please address

## 2018-08-04 MED ORDER — CEFAZOLIN SODIUM-DEXTROSE 2-4 GM/100ML-% IV SOLN
2.0000 g | INTRAVENOUS | Status: AC
  Administered 2018-08-05: 2 g via INTRAVENOUS

## 2018-08-05 ENCOUNTER — Inpatient Hospital Stay
Admission: RE | Admit: 2018-08-05 | Discharge: 2018-08-07 | DRG: 743 | Disposition: A | Discharge status: Home / Self Care | Payer: Managed Care, Other (non HMO) | Attending: Obstetrics and Gynecology | Admitting: Obstetrics and Gynecology

## 2018-08-05 ENCOUNTER — Inpatient Hospital Stay: Payer: Managed Care, Other (non HMO) | Admitting: Anesthesiology

## 2018-08-05 ENCOUNTER — Other Ambulatory Visit: Payer: Self-pay

## 2018-08-05 ENCOUNTER — Encounter
Admission: RE | Disposition: A | Discharge status: Home / Self Care | Payer: Self-pay | Source: Home / Self Care | Attending: Obstetrics and Gynecology

## 2018-08-05 DIAGNOSIS — Z9079 Acquired absence of other genital organ(s): Secondary | ICD-10-CM

## 2018-08-05 DIAGNOSIS — D509 Iron deficiency anemia, unspecified: Secondary | ICD-10-CM | POA: Diagnosis present

## 2018-08-05 DIAGNOSIS — N8003 Adenomyosis of the uterus: Secondary | ICD-10-CM

## 2018-08-05 DIAGNOSIS — F329 Major depressive disorder, single episode, unspecified: Secondary | ICD-10-CM | POA: Diagnosis present

## 2018-08-05 DIAGNOSIS — Z90722 Acquired absence of ovaries, bilateral: Secondary | ICD-10-CM

## 2018-08-05 DIAGNOSIS — N920 Excessive and frequent menstruation with regular cycle: Secondary | ICD-10-CM | POA: Diagnosis present

## 2018-08-05 DIAGNOSIS — N946 Dysmenorrhea, unspecified: Secondary | ICD-10-CM | POA: Diagnosis present

## 2018-08-05 DIAGNOSIS — F419 Anxiety disorder, unspecified: Secondary | ICD-10-CM | POA: Diagnosis present

## 2018-08-05 DIAGNOSIS — D259 Leiomyoma of uterus, unspecified: Principal | ICD-10-CM | POA: Diagnosis present

## 2018-08-05 DIAGNOSIS — N8000 Endometriosis of the uterus, unspecified: Secondary | ICD-10-CM

## 2018-08-05 DIAGNOSIS — K219 Gastro-esophageal reflux disease without esophagitis: Secondary | ICD-10-CM | POA: Diagnosis present

## 2018-08-05 DIAGNOSIS — Z90710 Acquired absence of both cervix and uterus: Secondary | ICD-10-CM

## 2018-08-05 DIAGNOSIS — Z87891 Personal history of nicotine dependence: Secondary | ICD-10-CM

## 2018-08-05 HISTORY — PX: HYSTERECTOMY ABDOMINAL WITH SALPINGO-OOPHORECTOMY: SHX6792

## 2018-08-05 LAB — URINE DRUG SCREEN, QUALITATIVE (ARMC ONLY)
AMPHETAMINES, UR SCREEN: NOT DETECTED
BENZODIAZEPINE, UR SCRN: NOT DETECTED
Barbiturates, Ur Screen: NOT DETECTED
Cannabinoid 50 Ng, Ur ~~LOC~~: POSITIVE — AB
Cocaine Metabolite,Ur ~~LOC~~: NOT DETECTED
MDMA (Ecstasy)Ur Screen: NOT DETECTED
Methadone Scn, Ur: NOT DETECTED
Opiate, Ur Screen: NOT DETECTED
Phencyclidine (PCP) Ur S: NOT DETECTED
Tricyclic, Ur Screen: NOT DETECTED

## 2018-08-05 LAB — ABO/RH: ABO/RH(D): A POS

## 2018-08-05 LAB — POCT PREGNANCY, URINE: Preg Test, Ur: NEGATIVE

## 2018-08-05 SURGERY — HYSTERECTOMY, ABDOMINAL, WITH SALPINGO-OOPHORECTOMY
Anesthesia: General | Laterality: Bilateral

## 2018-08-05 MED ORDER — ACETAMINOPHEN 10 MG/ML IV SOLN
INTRAVENOUS | Status: DC | PRN
  Administered 2018-08-05: 1000 mg via INTRAVENOUS

## 2018-08-05 MED ORDER — IBUPROFEN 800 MG PO TABS: 800.0000 mg | ORAL_TABLET | Freq: Four times a day (QID) | ORAL | Status: DC

## 2018-08-05 MED ORDER — ONDANSETRON HCL 4 MG/2ML IJ SOLN
4.0000 mg | Freq: Four times a day (QID) | INTRAMUSCULAR | Status: DC | PRN
  Administered 2018-08-05: 4 mg via INTRAVENOUS
  Filled 2018-08-05 (×2): qty 2

## 2018-08-05 MED ORDER — ROCURONIUM BROMIDE 100 MG/10ML IV SOLN
INTRAVENOUS | Status: DC | PRN
  Administered 2018-08-05: 5 mg via INTRAVENOUS
  Administered 2018-08-05: 25 mg via INTRAVENOUS

## 2018-08-05 MED ORDER — PHENYLEPHRINE HCL 10 MG/ML IJ SOLN
INTRAMUSCULAR | Status: AC
  Filled 2018-08-05: qty 1

## 2018-08-05 MED ORDER — ONDANSETRON HCL 4 MG PO TABS: 4.0000 mg | ORAL_TABLET | Freq: Four times a day (QID) | ORAL | Status: DC | PRN

## 2018-08-05 MED ORDER — BISACODYL 10 MG RE SUPP: 10.0000 mg | Freq: Every day | RECTAL | Status: DC | PRN

## 2018-08-05 MED ORDER — ONDANSETRON HCL 4 MG/2ML IJ SOLN
INTRAMUSCULAR | Status: AC
  Filled 2018-08-05: qty 2

## 2018-08-05 MED ORDER — ONDANSETRON HCL 4 MG/2ML IJ SOLN
INTRAMUSCULAR | Status: DC | PRN
  Administered 2018-08-05: 4 mg via INTRAVENOUS

## 2018-08-05 MED ORDER — DEXAMETHASONE SODIUM PHOSPHATE 10 MG/ML IJ SOLN
INTRAMUSCULAR | Status: AC
  Filled 2018-08-05: qty 1

## 2018-08-05 MED ORDER — MIDAZOLAM HCL 2 MG/2ML IJ SOLN
INTRAMUSCULAR | Status: AC
  Filled 2018-08-05: qty 2

## 2018-08-05 MED ORDER — PROPOFOL 10 MG/ML IV BOLUS
INTRAVENOUS | Status: DC | PRN
  Administered 2018-08-05: 150 mg via INTRAVENOUS

## 2018-08-05 MED ORDER — LIDOCAINE 5 % EX PTCH
MEDICATED_PATCH | CUTANEOUS | Status: AC
  Filled 2018-08-05: qty 1

## 2018-08-05 MED ORDER — CEFAZOLIN SODIUM-DEXTROSE 2-4 GM/100ML-% IV SOLN
INTRAVENOUS | Status: AC
  Filled 2018-08-05: qty 100

## 2018-08-05 MED ORDER — SUGAMMADEX SODIUM 200 MG/2ML IV SOLN
INTRAVENOUS | Status: AC
  Filled 2018-08-05: qty 2

## 2018-08-05 MED ORDER — SUCCINYLCHOLINE CHLORIDE 20 MG/ML IJ SOLN
INTRAMUSCULAR | Status: DC | PRN
  Administered 2018-08-05: 100 mg via INTRAVENOUS

## 2018-08-05 MED ORDER — LIDOCAINE HCL (CARDIAC) PF 100 MG/5ML IV SOSY
PREFILLED_SYRINGE | INTRAVENOUS | Status: DC | PRN
  Administered 2018-08-05: 60 mg via INTRAVENOUS

## 2018-08-05 MED ORDER — LAMOTRIGINE 100 MG PO TABS
200.0000 mg | ORAL_TABLET | Freq: Every day | ORAL | Status: DC
  Administered 2018-08-05 – 2018-08-06 (×2): 200 mg via ORAL
  Filled 2018-08-05: qty 8
  Filled 2018-08-05 (×2): qty 2
  Filled 2018-08-05: qty 8
  Filled 2018-08-05: qty 2

## 2018-08-05 MED ORDER — LACTATED RINGERS IV SOLN
INTRAVENOUS | Status: DC
  Administered 2018-08-05 (×2): via INTRAVENOUS

## 2018-08-05 MED ORDER — LACTATED RINGERS IV SOLN
INTRAVENOUS | Status: DC
  Administered 2018-08-05 – 2018-08-06 (×3): via INTRAVENOUS

## 2018-08-05 MED ORDER — MIDAZOLAM HCL 2 MG/2ML IJ SOLN
INTRAMUSCULAR | Status: DC | PRN
  Administered 2018-08-05 (×2): 1 mg via INTRAVENOUS

## 2018-08-05 MED ORDER — METOPROLOL TARTRATE 50 MG PO TABS
25.0000 mg | ORAL_TABLET | Freq: Every day | ORAL | Status: DC
  Administered 2018-08-05 – 2018-08-06 (×2): 25 mg via ORAL
  Filled 2018-08-05 (×3): qty 0.5

## 2018-08-05 MED ORDER — SERTRALINE HCL 100 MG PO TABS
100.0000 mg | ORAL_TABLET | Freq: Every day | ORAL | Status: DC
  Administered 2018-08-05 – 2018-08-06 (×2): 100 mg via ORAL
  Filled 2018-08-05 (×2): qty 1

## 2018-08-05 MED ORDER — KETOROLAC TROMETHAMINE 30 MG/ML IJ SOLN: 30.0000 mg | Freq: Four times a day (QID) | INTRAMUSCULAR | Status: DC

## 2018-08-05 MED ORDER — LIDOCAINE 5 % EX PTCH
MEDICATED_PATCH | CUTANEOUS | Status: DC | PRN
  Administered 2018-08-05: 1 via TRANSDERMAL

## 2018-08-05 MED ORDER — ONDANSETRON HCL 4 MG/2ML IJ SOLN: 4.0000 mg | Freq: Once | INTRAMUSCULAR | Status: DC | PRN

## 2018-08-05 MED ORDER — FENTANYL CITRATE (PF) 100 MCG/2ML IJ SOLN
25.0000 ug | INTRAMUSCULAR | Status: DC | PRN
  Administered 2018-08-05 (×3): 25 ug via INTRAVENOUS

## 2018-08-05 MED ORDER — EPHEDRINE SULFATE 50 MG/ML IJ SOLN
INTRAMUSCULAR | Status: DC | PRN
  Administered 2018-08-05: 5 mg via INTRAVENOUS

## 2018-08-05 MED ORDER — PROPOFOL 10 MG/ML IV BOLUS
INTRAVENOUS | Status: AC
  Filled 2018-08-05: qty 20

## 2018-08-05 MED ORDER — PANTOPRAZOLE SODIUM 40 MG PO TBEC
40.0000 mg | DELAYED_RELEASE_TABLET | Freq: Every day | ORAL | Status: DC
  Administered 2018-08-06 – 2018-08-07 (×2): 40 mg via ORAL
  Filled 2018-08-05 (×2): qty 1

## 2018-08-05 MED ORDER — HYDROMORPHONE HCL 1 MG/ML IJ SOLN
0.2000 mg | INTRAMUSCULAR | Status: DC | PRN
  Administered 2018-08-05: 0.4 mg via INTRAVENOUS
  Administered 2018-08-05: 0.6 mg via INTRAVENOUS
  Filled 2018-08-05 (×2): qty 1

## 2018-08-05 MED ORDER — DOCUSATE SODIUM 100 MG PO CAPS
100.0000 mg | ORAL_CAPSULE | Freq: Two times a day (BID) | ORAL | Status: DC
  Administered 2018-08-05: 50 mg via ORAL
  Administered 2018-08-06 – 2018-08-07 (×3): 100 mg via ORAL
  Filled 2018-08-05 (×4): qty 1

## 2018-08-05 MED ORDER — FENTANYL CITRATE (PF) 100 MCG/2ML IJ SOLN
INTRAMUSCULAR | Status: DC | PRN
  Administered 2018-08-05 (×2): 50 ug via INTRAVENOUS

## 2018-08-05 MED ORDER — EPHEDRINE SULFATE 50 MG/ML IJ SOLN
INTRAMUSCULAR | Status: AC
  Filled 2018-08-05: qty 1

## 2018-08-05 MED ORDER — FENTANYL CITRATE (PF) 100 MCG/2ML IJ SOLN
INTRAMUSCULAR | Status: AC
  Administered 2018-08-05: 25 ug via INTRAVENOUS
  Filled 2018-08-05: qty 2

## 2018-08-05 MED ORDER — LIDOCAINE HCL (PF) 2 % IJ SOLN
INTRAMUSCULAR | Status: AC
  Filled 2018-08-05: qty 10

## 2018-08-05 MED ORDER — DEXAMETHASONE SODIUM PHOSPHATE 10 MG/ML IJ SOLN
INTRAMUSCULAR | Status: DC | PRN
  Administered 2018-08-05: 5 mg via INTRAVENOUS

## 2018-08-05 MED ORDER — HYDROMORPHONE HCL 2 MG PO TABS
2.0000 mg | ORAL_TABLET | ORAL | Status: DC | PRN
  Administered 2018-08-05 – 2018-08-07 (×7): 2 mg via ORAL
  Filled 2018-08-05 (×7): qty 1

## 2018-08-05 MED ORDER — SIMETHICONE 80 MG PO CHEW: 80.0000 mg | CHEWABLE_TABLET | Freq: Four times a day (QID) | ORAL | Status: DC | PRN

## 2018-08-05 MED ORDER — GABAPENTIN 100 MG PO CAPS
100.0000 mg | ORAL_CAPSULE | Freq: Once | ORAL | Status: AC
  Administered 2018-08-06: 100 mg via ORAL
  Filled 2018-08-05: qty 1

## 2018-08-05 MED ORDER — KETOROLAC TROMETHAMINE 30 MG/ML IJ SOLN
30.0000 mg | Freq: Four times a day (QID) | INTRAMUSCULAR | Status: AC
  Administered 2018-08-05 – 2018-08-06 (×4): 30 mg via INTRAVENOUS
  Filled 2018-08-05 (×4): qty 1

## 2018-08-05 MED ORDER — SUCCINYLCHOLINE CHLORIDE 20 MG/ML IJ SOLN
INTRAMUSCULAR | Status: AC
  Filled 2018-08-05: qty 1

## 2018-08-05 MED ORDER — SUGAMMADEX SODIUM 200 MG/2ML IV SOLN
INTRAVENOUS | Status: DC | PRN
  Administered 2018-08-05: 200 mg via INTRAVENOUS

## 2018-08-05 MED ORDER — FENTANYL CITRATE (PF) 100 MCG/2ML IJ SOLN
INTRAMUSCULAR | Status: AC
  Filled 2018-08-05: qty 2

## 2018-08-05 MED ORDER — ROCURONIUM BROMIDE 50 MG/5ML IV SOLN
INTRAVENOUS | Status: AC
  Filled 2018-08-05: qty 1

## 2018-08-05 MED ORDER — OXCARBAZEPINE 300 MG PO TABS
900.0000 mg | ORAL_TABLET | Freq: Every day | ORAL | Status: DC
  Administered 2018-08-05 – 2018-08-06 (×2): 900 mg via ORAL
  Filled 2018-08-05 (×3): qty 3

## 2018-08-05 SURGICAL SUPPLY — 38 items
CANISTER SUCT 1200ML W/VALVE (MISCELLANEOUS) ×2 IMPLANT
CHLORAPREP W/TINT 26ML (MISCELLANEOUS) ×2 IMPLANT
COVER WAND RF STERILE (DRAPES) ×2 IMPLANT
DRAPE LAPAROTOMY 100X77 ABD (DRAPES) IMPLANT
DRAPE LAPAROTOMY TRNSV 106X77 (MISCELLANEOUS) IMPLANT
DRSG TELFA 3X8 NADH (GAUZE/BANDAGES/DRESSINGS) ×2 IMPLANT
ELECT BLADE 6 FLAT ULTRCLN (ELECTRODE) ×2 IMPLANT
ELECT BLADE 6.5 EXT (BLADE) ×2 IMPLANT
ELECT CAUTERY BLADE 6.4 (BLADE) ×2 IMPLANT
ELECT REM PT RETURN 9FT ADLT (ELECTROSURGICAL) ×2
ELECTRODE REM PT RTRN 9FT ADLT (ELECTROSURGICAL) ×1 IMPLANT
GAUZE SPONGE 4X4 12PLY STRL (GAUZE/BANDAGES/DRESSINGS) ×2 IMPLANT
GLOVE BIO SURGEON STRL SZ 6.5 (GLOVE) ×2 IMPLANT
GLOVE BIO SURGEON STRL SZ8 (GLOVE) ×2 IMPLANT
GLOVE INDICATOR 7.0 STRL GRN (GLOVE) ×2 IMPLANT
GLOVE INDICATOR 8.0 STRL GRN (GLOVE) ×2 IMPLANT
GOWN STRL REUS W/ TWL LRG LVL3 (GOWN DISPOSABLE) ×2 IMPLANT
GOWN STRL REUS W/ TWL XL LVL3 (GOWN DISPOSABLE) ×1 IMPLANT
GOWN STRL REUS W/TWL LRG LVL3 (GOWN DISPOSABLE) ×2
GOWN STRL REUS W/TWL XL LVL3 (GOWN DISPOSABLE) ×1
KIT TURNOVER KIT A (KITS) ×2 IMPLANT
LABEL OR SOLS (LABEL) ×2 IMPLANT
LIGASURE IMPACT 36 18CM CVD LR (INSTRUMENTS) ×1 IMPLANT
PACK BASIN MAJOR ARMC (MISCELLANEOUS) ×2 IMPLANT
PAD DRESSING TELFA 3X8 NADH (GAUZE/BANDAGES/DRESSINGS) ×1 IMPLANT
RETRACTOR WOUND ALXS 18CM MED (MISCELLANEOUS) IMPLANT
RTRCTR WOUND ALEXIS O 18CM MED (MISCELLANEOUS) ×2
STAPLER SKIN PROX 35W (STAPLE) ×2 IMPLANT
SUT CHROMIC 0 CT 1 (SUTURE) ×4 IMPLANT
SUT MAXON ABS #0 GS21 30IN (SUTURE) ×4 IMPLANT
SUT SILK 0 (SUTURE) ×2
SUT SILK 0 30XBRD TIE 6 (SUTURE) ×2 IMPLANT
SUT VIC AB 0 CT1 27 (SUTURE) ×1
SUT VIC AB 0 CT1 27XCR 8 STRN (SUTURE) ×1 IMPLANT
SUT VIC AB 2-0 CT1 36 (SUTURE) ×2 IMPLANT
SUT VIC AB 4-0 KS 27 (SUTURE) ×1 IMPLANT
TRAY FOLEY MTR SLVR 16FR STAT (SET/KITS/TRAYS/PACK) ×2 IMPLANT
WATER STERILE IRR 1000ML POUR (IV SOLUTION) ×2 IMPLANT

## 2018-08-05 NOTE — Interval H&P Note (Signed)
History and Physical Interval Note:  08/05/2018 9:44 AM  [name] [name]  has presented today for surgery, with the diagnosis of MENORRHAGIA, DYSMENORRHEA, UTERINE LEIOMYOMA  The various methods of treatment have been discussed with the patient and family. After consideration of risks, benefits and other options for treatment, the patient has consented to  Procedure(s): HYSTERECTOMY ABDOMINAL WITH BILATERAL SALPINGO-OOPHORECTOMY (Bilateral) as a surgical intervention .  The patient's history has been reviewed, patient examined, no change in status, stable for surgery.  I have reviewed the patient's chart and labs.  Questions were answered to the patient's satisfaction.     Hassell Done A Nadea Kirkland

## 2018-08-05 NOTE — Anesthesia Preprocedure Evaluation (Signed)
Anesthesia Evaluation  Patient identified by MRN, date of birth, ID band Patient awake    Reviewed: Allergy & Precautions, H&P , NPO status , Patient's Chart, lab work & pertinent test results, reviewed documented beta blocker date and time   Airway Mallampati: II  TM Distance: >3 FB Neck ROM: full    Dental  (+) Teeth Intact   Pulmonary neg pulmonary ROS, former smoker,    Pulmonary exam normal        Cardiovascular Exercise Tolerance: Good hypertension, On Medications negative cardio ROS Normal cardiovascular exam Rhythm:regular Rate:Normal     Neuro/Psych  Headaches, negative psych ROS   GI/Hepatic Neg liver ROS, hiatal hernia, GERD  Medicated,  Endo/Other  negative endocrine ROS  Renal/GU negative Renal ROS  negative genitourinary   Musculoskeletal   Abdominal   Peds  Hematology  (+) Blood dyscrasia, anemia ,   Anesthesia Other Findings Past Medical History: No date: A-fib ([place]) No date: Anemia No date: Anxiety No date: Arthritis No date: B12 deficiency No date: Chicken pox No date: Depression No date: Fibroid No date: GERD (gastroesophageal reflux disease) No date: History of hiatal hernia No date: Hypertension No date: Migraines Past Surgical History: 5/11: APPENDECTOMY No date: C-spine surgery 10/98,9/12: CESAREAN SECTION 03/2008: TONSILLECTOMY No date: TUBAL LIGATION   Reproductive/Obstetrics negative OB ROS                             Anesthesia Physical Anesthesia Plan  ASA: III  Anesthesia Plan: General ETT   Post-op Pain Management:    Induction:   PONV Risk Score and Plan: 4 or greater  Airway Management Planned:   Additional Equipment:   Intra-op Plan:   Post-operative Plan:   Informed Consent: I have reviewed the patients History and Physical, chart, labs and discussed the procedure including the risks, benefits and alternatives for the proposed  anesthesia with the patient or authorized representative who has indicated his/her understanding and acceptance.   Dental Advisory Given  Plan Discussed with: CRNA  Anesthesia Plan Comments:         Anesthesia Quick Evaluation

## 2018-08-05 NOTE — Transfer of Care (Signed)
Immediate Anesthesia Transfer of Care Note  Patient: [name] [name]  Procedure(s) Performed: HYSTERECTOMY ABDOMINAL WITH BILATERAL SALPINGO-OOPHORECTOMY (Bilateral )  Patient Location: PACU  Anesthesia Type:General  Level of Consciousness: awake  Airway & Oxygen Therapy: Patient Spontanous Breathing  Post-op Assessment: Report given to RN  Post vital signs: stable  Last Vitals:  Vitals Value Taken Time  BP 170/89 08/05/2018 12:27 PM  Temp 36.2 C 08/05/2018 12:27 PM  Pulse 85 08/05/2018 12:32 PM  Resp 12 08/05/2018 12:29 PM  SpO2 96 % 08/05/2018 12:32 PM  Vitals shown include unvalidated device data.  Last Pain:  Vitals:   08/05/18 1227  TempSrc:   PainSc: 8          Complications: No apparent anesthesia complications

## 2018-08-05 NOTE — Anesthesia Procedure Notes (Signed)
Procedure Name: Intubation Date/Time: 08/05/2018 10:33 AM Performed by: Zetta Bills, CRNA Pre-anesthesia Checklist: Patient identified, Emergency Drugs available and Patient being monitored Patient Re-evaluated:Patient Re-evaluated prior to induction Oxygen Delivery Method: Circle system utilized Preoxygenation: Pre-oxygenation with 100% oxygen Induction Type: IV induction Ventilation: Mask ventilation without difficulty Laryngoscope Size: 3, McGraph and Mac Grade View: Grade II Tube type: Oral Tube size: 7.0 mm Number of attempts: 2 Airway Equipment and Method: Stylet and Video-laryngoscopy Placement Confirmation: ETT inserted through vocal cords under direct vision Secured at: 21 cm Tube secured with: Tape Dental Injury: Teeth and Oropharynx as per pre-operative assessment

## 2018-08-05 NOTE — Anesthesia Post-op Follow-up Note (Signed)
Anesthesia QCDR form completed.        

## 2018-08-05 NOTE — Op Note (Signed)
OPERATIVE NOTE:  [name] PROCEDURE DATE: 08/05/2018   PREOPERATIVE DIAGNOSIS:  Symptomatic Uterine fibroids  POSTOPERATIVE DIAGNOSIS:  SAA  PROCEDURE: TAHBSO   SURGEON:  Brayton Mars, MD ASSISTANTS: Dr. Marcelline Mates ANESTHESIA: General INDICATIONS: 52 y.o. [id number] with history of uterine fibroids who presents for definitive surgical management.  Patient has long history of menorrhagia and pelvic pain refractory to medical therapy.  She is taking iron infusions for chronic iron deficiency anemia.  FINDINGS: 12-week fibroid uterus; grossly normal-appearing ovaries fallopian tubes with evidence of salpingectomies.  I/O's: Total I/O In: 1000 [I.V.:1000] Out: 350 [Urine:300; Blood:50] COUNTS:  YES SPECIMENS: Cervix with uterus, bilateral ovaries and portions of fallopian tubes bilaterally ANTIBIOTIC PROPHYLAXIS:Gentamicin and clindamycin COMPLICATIONS: None immediate  PROCEDURE IN DETAIL: Dr. Marcelline Mates was used as a first Environmental consultant in this procedure.  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 Marcelline Mates provided expertise to help complete the procedure in a timely and efficient manner..   Patient was brought to the operating room and  placed in supine position.  General endotracheal anesthesia was induced without difficulty.  A ChloraPrep and Hibiclens abdominal perineal and intravaginal prep and drape was performed in standard fashion.  Foley catheter was placed and was draining clear yellow urine.  Timeout was completed.  Pfannenstiel incision made into the abdomen.  The fascia was incised transversely and extended bilaterally with Mayo scissors.  The rectus fascia was dissected off of the rectus muscle through sharp and blunt dissection.  Peritoneum was entered.  The Alexis retractor was placed.  Bowel was packed off with wet laps.  Hysterectomy was then performed  in standard fashion with the aid of the LigaSure instrument.  The ureters were identified intraoperatively and kept away from the operative field.  Round ligaments were clamped coagulated and cut.  The infundibulopelvic ligaments were clamped coagulated and cut.  The cardinal broad ligament complexes were sequentially taken down with the clamping coagulating and cutting technique using the LigaSure instrument.  The anterior leaf of the broad ligament was opened and the bladder flap was created through sharp dissection.  Moderate adhesions were encountered in the bladder flap region.  These extensive adhesions were taken down with sharp dissection.  The uterine arteries were clamped coagulated and cut using the LigaSure instrument.  The remainder of the cardinal broad ligament complexes were then taken down to the level of the cervicovaginal junction.  At this point curved Heaney clamps were used to cross-clamp the cervicovaginal junction.  The specimen was excised from the operative field with Mayo scissors.  The angles of the vagina were closed using a Richardson stitch of 0 Vicryl suture.  The intervening cuff was then reapproximated using figure-of-eight sutures of 0 Vicryl.  The pelvis was copiously irrigated and the irrigant fluid was aspirated.  Good hemostasis was noted.  The abdomen was then closed in layers following removal of the Alexis retractor and laps.  The fascia was closed with 0 Maxon in a simple running manner.  Subcutaneous tissues were reapproximated using 2-0 Vicryl.  The skin was closed with a 4-0 Vicryl subcuticular stitch.  Dermabond glue was placed over the incision.  Pressure dressing was applied.  Patient was then awakened extubated and taken recovery in satisfactory condition.  Jacky Dross A. Zipporah Plants, MD, ACOG [facility]

## 2018-08-06 LAB — HEMOGLOBIN: Hemoglobin: 10.1 g/dL — ABNORMAL LOW (ref 12.0–15.0)

## 2018-08-06 LAB — SURGICAL PATHOLOGY

## 2018-08-06 MED ORDER — IBUPROFEN 800 MG PO TABS
800.0000 mg | ORAL_TABLET | Freq: Four times a day (QID) | ORAL | Status: DC
  Administered 2018-08-06 – 2018-08-07 (×4): 800 mg via ORAL
  Filled 2018-08-06 (×4): qty 1

## 2018-08-06 MED ORDER — ACETAMINOPHEN 500 MG PO TABS
1000.0000 mg | ORAL_TABLET | Freq: Four times a day (QID) | ORAL | Status: DC | PRN
  Administered 2018-08-06 – 2018-08-07 (×2): 1000 mg via ORAL
  Filled 2018-08-06 (×2): qty 2

## 2018-08-06 MED ORDER — IBUPROFEN 800 MG PO TABS: 800.0000 mg | ORAL_TABLET | Freq: Four times a day (QID) | ORAL | Status: DC

## 2018-08-06 NOTE — Progress Notes (Signed)
1 Day Post-Op Procedure(s) (LRB): HYSTERECTOMY ABDOMINAL WITH BILATERAL SALPINGO-OOPHORECTOMY (Bilateral)  Subjective: Patient reports incisional pain, tolerating PO and no problems voiding.    Objective: I have reviewed patient's vital signs, intake and output, medications and labs.  General: alert, cooperative and no distress Resp: Pulse Ox 99%; unlabored breathing, normal rate Cardio: regular rate and rhythm Extremities: extremities normal, atraumatic, no cyanosis or edema and no calf tenderness Vaginal Bleeding: none  Assessment: s/p Procedure(s): HYSTERECTOMY ABDOMINAL WITH BILATERAL SALPINGO-OOPHORECTOMY (Bilateral): stable  Plan: Advance diet Encourage ambulation Advance to PO medication  LOS: 1 day    [name] [name] 08/06/2018, 7:55 AM

## 2018-08-07 DIAGNOSIS — N8000 Endometriosis of the uterus, unspecified: Secondary | ICD-10-CM

## 2018-08-07 DIAGNOSIS — N8003 Adenomyosis of the uterus: Secondary | ICD-10-CM

## 2018-08-07 MED ORDER — IBUPROFEN 600 MG PO TABS: 600.0000 mg | ORAL_TABLET | Freq: Four times a day (QID) | ORAL | 0 refills | Status: DC

## 2018-08-07 MED ORDER — HYDROMORPHONE HCL 2 MG PO TABS: 2.0000 mg | ORAL_TABLET | ORAL | 0 refills | Status: DC | PRN

## 2018-08-07 MED ORDER — ACETAMINOPHEN 500 MG PO TABS: 1000.0000 mg | ORAL_TABLET | Freq: Four times a day (QID) | ORAL | 1 refills | Status: DC

## 2018-08-07 NOTE — Discharge Summary (Signed)
Physician Discharge Summary  [name] MRN: [id number] DOB/AGE: Apr 09, 1966 52 y.o.  Admit date: 08/05/2018 Discharge date: 08/07/2018  Admission Diagnoses: 1.  Leiomyoma uteri 2.  Dysmenorrhea 3.  Abnormal uterine bleeding  Discharge Diagnoses:  Active Problems:   S/P total hysterectomy and BSO (bilateral salpingo-oophorectomy)   Uterus, adenomyosis Same as above  Discharged Condition: good  Hospital Course: Uncomplicated  Consults: None  Significant Diagnostic Studies: labs:  CBC Latest Ref Rng & Units 08/06/2018 07/30/2018 05/31/2018  WBC 4.0 - 10.5 K/uL - 4.9 11.5(H)  Hemoglobin 12.0 - 15.0 g/dL 10.1(L) 10.8(L) 10.1(L)  Hematocrit 36.0 - 46.0 % - 36.3 32.7(L)  Platelets 150 - 400 K/uL - 449(H) 489(H)   and  PATHOLOGY: DIAGNOSIS:  A. UTERUS WITH CERVIX, BILATERAL FALLOPIAN TUBES AND OVARIES; TOTAL  HYSTERECTOMY WITH BILATERAL SALPINGO-OOPHORECTOMY:  - BENIGN TRANSFORMATION ZONE; NEGATIVE FOR SQUAMOUS INTRAEPITHELIAL  LESION AND MALIGNANCY.  - DISORDERED PROLIFERATIVE ENDOMETRIUM; NEGATIVE FOR ATYPICAL  HYPERPLASIA/EIN AND MALIGNANCY.  - ADENOMYOSIS.  - LEIOMYOMATA UTERI; NEGATIVE FOR FEATURES OF MALIGNANCY.  - BILATERAL FALLOPIAN TUBES WITH NO SIGNIFICANT HISTOPATHOLOGIC CHANGE.  - BILATERAL OVARIES WITH CYSTIC AND HEMORRHAGIC FOLLICLES; OTHERWISE  BENIGN PHYSIOLOGIC CHANGES.   Treatments: IV hydration, antibiotics: gentamycin and clindamycin, analgesia: Ibuprofen,Acetaminophen and Dilaudid, and surgery: TAHBSO  Discharge Exam: Blood pressure 126/81, pulse 66, temperature 98.1 F (36.7 C), temperature source Oral, resp. rate 16, height 5\' 5"  (1.651 m), weight 92.1 kg, SpO2 98 %. General appearance: alert, cooperative and no distress Resp: unlabored regular rate GI: soft, non-tender; bowel sounds normal; no masses,  no organomegaly Extremities: extremities normal, atraumatic, no cyanosis or edema and Homans sign is negative, no sign of  DVT Skin: Skin color, texture, turgor normal. No rashes or lesions Incision/Wound:CDI  Disposition: Discharge disposition: 01-Home or Self Care        Allergies as of 08/07/2018      Reactions   Phenylmercuric Nitrate Rash   Augmentin [amoxicillin-pot Clavulanate] Nausea Only   Has patient had a PCN reaction causing immediate rash, facial/tongue/throat swelling, SOB or lightheadedness with hypotension: No Has patient had a PCN reaction causing severe rash involving mucus membranes or skin necrosis: No Has patient had a PCN reaction that required hospitalization: No Has patient had a PCN reaction occurring within the last 10 years: Yes If all of the above answers are "NO", then may proceed with Cephalosporin use.   Codeine Nausea Only   Diclofenac Nausea Only   Latex Rash   Neomycin Rash   Percocet [oxycodone-acetaminophen] Rash      Medication List    STOP taking these medications   azithromycin 250 MG tablet Commonly known as:  ZITHROMAX     TAKE these medications   acetaminophen 500 MG tablet Commonly known as:  TYLENOL Take 2 tablets (1,000 mg total) by mouth 4 (four) times daily.   aspirin 81 MG EC tablet Take 81 mg by mouth at bedtime.   clonazePAM 0.5 MG tablet Commonly known as:  KLONOPIN Take 0.5 mg by mouth daily as needed for anxiety.   cyanocobalamin 1000 MCG/ML injection Commonly known as:  (VITAMIN B-12) Inject 1 mL (1,000 mcg total) into the muscle every 30 (thirty) days.   fluticasone 50 MCG/ACT nasal spray Commonly known as:  FLONASE Place 1 spray into both nostrils daily as needed for congestion.   FUSION PLUS Caps Take 1 capsule by mouth daily. What changed:  when to take this   HYDROmorphone 2 MG tablet Commonly known as:  DILAUDID  Take 1 tablet (2 mg total) by mouth every 4 (four) hours as needed for moderate pain or severe pain.   ibuprofen 600 MG tablet Commonly known as:  ADVIL,MOTRIN Take 1 tablet (600 mg total) by mouth 4  (four) times daily. What changed:    medication strength  how much to take  when to take this  reasons to take this   LAMICTAL 200 MG tablet Generic drug:  lamoTRIgine Take 200 mg by mouth at bedtime.   metoprolol tartrate 25 MG tablet Commonly known as:  LOPRESSOR Take 25 mg by mouth at bedtime.   multivitamin with minerals Tabs tablet Take 1 tablet by mouth at bedtime.   omeprazole 20 MG capsule Commonly known as:  PRILOSEC Take 20 mg by mouth at bedtime.   Oxcarbazepine 300 MG tablet Commonly known as:  TRILEPTAL TAKE 3 TABLETS BY MOUTH  EVERY NIGHT AT BEDTIME   sertraline 100 MG tablet Commonly known as:  ZOLOFT Take 100 mg by mouth at bedtime.   Vitamin D (Ergocalciferol) 1.25 MG (50000 UT) Caps capsule Commonly known as:  DRISDOL Take 1 capsule (50,000 Units total) by mouth 2 (two) times a week.      Follow-up Information    Rubie Maid, MD In 1 week.   Specialties:  Obstetrics and Gynecology, Radiology Why:  Post Op Check Contact information: [address] [place] [phone]           Signed: Alanda Slim Kweku Stankey 08/07/2018, 7:24 AM

## 2018-08-07 NOTE — Discharge Instructions (Signed)
Please call your doctor or return to the ER if you experience any chest pains, shortness of breath, dizziness, visual changes, fever greater than 101, any heavy bleeding (saturating more than 1 pad per hour), large clots, or foul smelling discharge, or any worsening abdominal pain and cramping that is not controlled by pain medication. No tampons, enemas, douches, or sexual intercourse for 6 weeks. Also avoid tub baths, hot tubs, or swimming for 6 weeks.     Check your incision daily for any signs of infection such as warmth, redness, swelling, increased pain, or pus/foul smelling drainage   Activity: do not lift over 10 lbs for 6 weeks  No driving for 2 weeks  Pelvic rest for 6 weeks  Showers only for 6 weeks

## 2018-08-07 NOTE — Anesthesia Postprocedure Evaluation (Signed)
Anesthesia Post Note  Patient: [name] [name]  Procedure(s) Performed: HYSTERECTOMY ABDOMINAL WITH BILATERAL SALPINGO-OOPHORECTOMY (Bilateral )  Patient location during evaluation: PACU Anesthesia Type: General Level of consciousness: awake and alert Pain management: pain level controlled Vital Signs Assessment: post-procedure vital signs reviewed and stable Respiratory status: spontaneous breathing, nonlabored ventilation, respiratory function stable and patient connected to nasal cannula oxygen Cardiovascular status: blood pressure returned to baseline and stable Postop Assessment: no apparent nausea or vomiting Anesthetic complications: no     Last Vitals:  Vitals:   08/07/18 0831 08/07/18 0943  BP: (!) 150/99 (!) 149/69  Pulse: 73   Resp: 18   Temp: 36.8 C   SpO2: 97%     Last Pain:  Vitals:   08/07/18 1037  TempSrc:   PainSc: 3                  Molli Barrows

## 2018-08-07 NOTE — Progress Notes (Signed)
Discharge order received from doctor. Reviewed discharge instructions and prescriptions with patient and answered all questions. Follow up appointment given. Incision cleaning kit given. Patient verbalized understanding. Patient discharged home via wheelchair by nursing/auxillary.    Eisley Barber Garner, RN  

## 2018-08-08 NOTE — Telephone Encounter (Signed)
RTC rejected for life insurance through work DT dx of bipolar disorder.  Discussed benefits is a fairly common occurrence and some possible mitigating factors such as last hospitalization resulted in the diagnosis of bipolar disorder and since being on the proper medication she has no longer had suicidal thoughts.  She will write a letter of appeal and share with this MD and I will support her with another letter.  Lynder Parents, MD, DFAPA

## 2018-08-09 ENCOUNTER — Telehealth: Payer: Self-pay | Admitting: Surgical

## 2018-08-09 NOTE — Telephone Encounter (Signed)
Patient called in for constipation. Patient had surgery with Dr. Keturah Barre on Monday. Patient states she has not had bowel movement since. She has taking OTC stool softener and laxative without relief. She has tried fiber, drinking plenty of water, and prune juice. I spoke with MW Deneise Lever and she said the next step would be enema or going to the ED. Relayed message to patient. Patient verbalized understanding. I let her know to let us know if she needs anything else.

## 2018-08-16 ENCOUNTER — Other Ambulatory Visit: Payer: Self-pay | Admitting: Obstetrics and Gynecology

## 2018-08-20 ENCOUNTER — Encounter: Payer: Managed Care, Other (non HMO) | Admitting: Obstetrics and Gynecology

## 2018-08-23 ENCOUNTER — Encounter: Payer: Self-pay | Admitting: Psychiatry

## 2018-08-23 ENCOUNTER — Ambulatory Visit: Payer: Managed Care, Other (non HMO) | Admitting: Psychiatry

## 2018-08-23 DIAGNOSIS — F4001 Agoraphobia with panic disorder: Secondary | ICD-10-CM | POA: Diagnosis not present

## 2018-08-23 DIAGNOSIS — E538 Deficiency of other specified B group vitamins: Secondary | ICD-10-CM

## 2018-08-23 DIAGNOSIS — F411 Generalized anxiety disorder: Secondary | ICD-10-CM | POA: Diagnosis not present

## 2018-08-23 DIAGNOSIS — F319 Bipolar disorder, unspecified: Secondary | ICD-10-CM

## 2018-08-23 DIAGNOSIS — F4310 Post-traumatic stress disorder, unspecified: Secondary | ICD-10-CM | POA: Diagnosis not present

## 2018-08-23 MED ORDER — CLONAZEPAM 0.5 MG PO TABS: 0.5000 mg | ORAL_TABLET | Freq: Every day | ORAL | 5 refills | Status: DC | PRN

## 2018-08-23 MED ORDER — CYANOCOBALAMIN 1000 MCG/ML IJ SOLN: 1000.0000 ug | INTRAMUSCULAR | 1 refills | Status: DC

## 2018-08-23 NOTE — Progress Notes (Signed)
[name] [name] [id number] 25-May-1966 53 y.o.  Subjective:   Patient ID:  [name] [name] is a 53 y.o. (DOB March 16, 1966) female.  Chief Complaint:  Chief Complaint  Patient presents with  . Follow-up    Medication Management  . Altered Mental Status    forgetful    HPI [name] [name] presents to the office today for follow-up of several dxes. "Pretty good".  Not much mood swings.  Usually depressed in Oct but hasn't.    Patient reports stable mood and denies depressed or irritable moods other than mild fluctuations. Patient denies any recent difficulty with anxiety unless triggered by high demands. No panic.  Patient denies difficulty with sleep initiation or maintenance. Denies appetite disturbance.  Patient reports that energy and motivation have been good.  Patient denies any difficulty with concentration.  Patient denies any suicidal ideation.  Still forgetful.  Was told she had low b12, but had trouble getting it.  Just had hysterectomy for endometriosis and plans to start the patch.  Worried about hormones affecting mood.  Hx bad PMS.  Prior psychiatric medication trials include sertraline, Depakote, Saphris, lithium, lamotrigine, risperidone, Trileptal Review of Systems:  Review of Systems  Neurological: Negative for tremors and weakness.  Psychiatric/Behavioral: Negative for agitation, behavioral problems, confusion, decreased concentration, dysphoric mood, hallucinations, self-injury, sleep disturbance and suicidal ideas. The patient is not nervous/anxious and is not hyperactive.     Medications: I have reviewed the patient's current medications.  Current Outpatient Medications  Medication Sig Dispense Refill  . aspirin 81 MG EC tablet Take 81 mg by mouth at bedtime.   3  . clonazePAM (KLONOPIN) 0.5 MG tablet Take 0.5 mg by mouth daily as needed for anxiety.   1  . fluticasone (FLONASE) 50 MCG/ACT nasal spray Place 1 spray into both nostrils daily as needed for  congestion.  0  . ibuprofen (ADVIL,MOTRIN) 600 MG tablet TAKE 1 TABLET BY MOUTH FOUR TIMES DAILY 50 tablet 0  . Iron-FA-B Cmp-C-Biot-Probiotic (FUSION PLUS) CAPS Take 1 capsule by mouth daily. (Patient taking differently: Take 1 capsule by mouth at bedtime. ) 60 capsule 1  . lamoTRIgine (LAMICTAL) 200 MG tablet Take 200 mg by mouth at bedtime.     . metoprolol tartrate (LOPRESSOR) 25 MG tablet Take 25 mg by mouth at bedtime.     . Multiple Vitamin (MULTIVITAMIN WITH MINERALS) TABS tablet Take 1 tablet by mouth at bedtime.    Marland Kitchen omeprazole (PRILOSEC) 20 MG capsule Take 20 mg by mouth at bedtime.     . Oxcarbazepine (TRILEPTAL) 300 MG tablet TAKE 3 TABLETS BY MOUTH  EVERY NIGHT AT BEDTIME 270 tablet 0  . sertraline (ZOLOFT) 100 MG tablet Take 50 mg by mouth at bedtime.   1  . Vitamin D, Ergocalciferol, (DRISDOL) 50000 units CAPS capsule Take 1 capsule (50,000 Units total) by mouth 2 (two) times a week. 30 capsule 2  . acetaminophen (TYLENOL) 500 MG tablet Take 2 tablets (1,000 mg total) by mouth 4 (four) times daily. (Patient not taking: Reported on 08/23/2018) 50 tablet 1  . cyanocobalamin (,VITAMIN B-12,) 1000 MCG/ML injection Inject 1 mL (1,000 mcg total) into the muscle every 30 (thirty) days. 10 mL 1  . HYDROmorphone (DILAUDID) 2 MG tablet Take 1 tablet (2 mg total) by mouth every 4 (four) hours as needed for moderate pain or severe pain. (Patient not taking: Reported on 08/23/2018) 20 tablet 0   No current facility-administered medications for this visit.     Medication  Side Effects: None  Allergies:  Allergies  Allergen Reactions  . Phenylmercuric Nitrate Rash  . Augmentin [Amoxicillin-Pot Clavulanate] Nausea Only    Has patient had a PCN reaction causing immediate rash, facial/tongue/throat swelling, SOB or lightheadedness with hypotension: No Has patient had a PCN reaction causing severe rash involving mucus membranes or skin necrosis: No Has patient had a PCN reaction that required  hospitalization: No Has patient had a PCN reaction occurring within the last 10 years: Yes If all of the above answers are "NO", then may proceed with Cephalosporin use.   . Codeine Nausea Only  . Diclofenac Nausea Only  . Latex Rash  . Neomycin Rash  . Percocet [Oxycodone-Acetaminophen] Rash    Past Medical History:  Diagnosis Date  . A-fib ([place])   . Anemia   . Anxiety   . Arthritis   . B12 deficiency   . Chicken pox   . Depression   . Fibroid   . GERD (gastroesophageal reflux disease)   . History of hiatal hernia   . Hypertension   . Migraines     Family History  Problem Relation Age of Onset  . Diabetes Mother   . Alcohol abuse Father   . Arthritis Father   . Heart disease Father   . Arthritis Sister   . Alcohol abuse Maternal Aunt   . Alcohol abuse Maternal Uncle   . Alcohol abuse Paternal Grandfather   . Arthritis Maternal Grandmother   . Schizophrenia Maternal Grandmother   . Breast cancer Neg Hx     Social History   Socioeconomic History  . Marital status: Married    Spouse name: Not on file  . Number of children: Not on file  . Years of education: Not on file  . Highest education level: Not on file  Occupational History  . Not on file  Social Needs  . Financial resource strain: Not on file  . Food insecurity:    Worry: Not on file    Inability: Not on file  . Transportation needs:    Medical: Not on file    Non-medical: Not on file  Tobacco Use  . Smoking status: Former Smoker    Packs/day: 1.00    Years: 10.00    Pack years: 10.00    Types: Cigarettes    Last attempt to quit: 08/22/1991    Years since quitting: 27.0  . Smokeless tobacco: Never Used  Substance and Sexual Activity  . Alcohol use: Yes    Alcohol/week: 0.0 standard drinks    Comment: twice a year   . Drug use: Not Currently    Types: Marijuana    Comment: not for a couple years  . Sexual activity: Yes    Partners: Male    Birth control/protection: Surgical    Comment:  Husband  Lifestyle  . Physical activity:    Days per week: Not on file    Minutes per session: Not on file  . Stress: Not on file  Relationships  . Social connections:    Talks on phone: Not on file    Gets together: Not on file    Attends religious service: Not on file    Active member of club or organization: Not on file    Attends meetings of clubs or organizations: Not on file    Relationship status: Not on file  . Intimate partner violence:    Fear of current or ex partner: Not on file    Emotionally abused: Not  on file    Physically abused: Not on file    Forced sexual activity: Not on file  Other Topics Concern  . Not on file  Social History Narrative   [employer]    Lives with husband and 2 children    Son- 57 yo and daughter 32 yo    Pets: 2 dogs    Caffeine- 2 sodas 20 oz, coffee 1 cup occasionally    Enjoys being outside with family     Past Medical History, Surgical history, Social history, and Family history were reviewed and updated as appropriate.   Please see review of systems for further details on the patient's review from today.   Objective:   Physical Exam:  There were no vitals taken for this visit.  Physical Exam Constitutional:      General: She is not in acute distress.    Appearance: She is well-developed.  Musculoskeletal:        General: No deformity.  Neurological:     Mental Status: She is alert and oriented to person, place, and time.     Motor: No tremor.     Coordination: Coordination normal.     Gait: Gait normal.  Psychiatric:        Attention and Perception: Attention normal.        Mood and Affect: Mood is not anxious or depressed. Affect is not labile, blunt, angry or inappropriate.        Speech: Speech normal.        Behavior: Behavior normal.        Thought Content: Thought content normal. Thought content does not include homicidal or suicidal ideation. Thought content does not include  homicidal or suicidal plan.        Cognition and Memory: Cognition normal.        Judgment: Judgment normal.     Comments: Insight intact. No auditory or visual hallucinations. No delusions.  Occ irritability.     Lab Review:     Component Value Date/Time   NA 139 05/31/2018 1530   NA 138 11/25/2014 2152   K 4.3 05/31/2018 1530   K 3.7 11/25/2014 2152   CL 104 05/31/2018 1530   CL 105 11/25/2014 2152   CO2 23 05/31/2018 1530   CO2 28 11/25/2014 2152   GLUCOSE 128 (H) 05/31/2018 1530   GLUCOSE 108 (H) 12/26/2016 0812   GLUCOSE 98 11/25/2014 2152   BUN 14 05/31/2018 1530   BUN 10 11/25/2014 2152   CREATININE 0.84 05/31/2018 1530   CREATININE 0.81 11/25/2014 2152   CALCIUM 10.2 05/31/2018 1530   CALCIUM 10.0 11/25/2014 2152   PROT 6.9 05/31/2018 1530   PROT 7.2 11/25/2014 2152   ALBUMIN 4.2 05/31/2018 1530   ALBUMIN 4.1 11/25/2014 2152   AST 11 05/31/2018 1530   AST 15 11/25/2014 2152   ALT 12 05/31/2018 1530   ALT 13 (L) 11/25/2014 2152   ALKPHOS 85 05/31/2018 1530   ALKPHOS 61 11/25/2014 2152   BILITOT <0.2 05/31/2018 1530   BILITOT 0.4 11/25/2014 2152   GFRNONAA 81 05/31/2018 1530   GFRNONAA >60 11/25/2014 2152   GFRAA 94 05/31/2018 1530   GFRAA >60 11/25/2014 2152       Component Value Date/Time   WBC 4.9 07/30/2018 1033   RBC 4.82 07/30/2018 1033   HGB 10.1 (L) 08/06/2018 0527   HGB 10.1 (L) 05/31/2018 1530   HCT 36.3 07/30/2018 1033   HCT 32.7 (  L) 05/31/2018 1530   PLT 449 (H) 07/30/2018 1033   PLT 489 (H) 05/31/2018 1530   MCV 75.3 (L) 07/30/2018 1033   MCV 73 (L) 05/31/2018 1530   MCV 77 (L) 11/25/2014 2152   MCH 22.4 (L) 07/30/2018 1033   MCHC 29.8 (L) 07/30/2018 1033   RDW 17.5 (H) 07/30/2018 1033   RDW 17.0 (H) 05/31/2018 1530   RDW 22.4 (H) 11/25/2014 2152   LYMPHSABS 2.2 07/30/2018 1033   LYMPHSABS 2.4 03/27/2013 0500   MONOABS 0.4 07/30/2018 1033   MONOABS 0.4 03/27/2013 0500   EOSABS 0.3 07/30/2018 1033   EOSABS 0.2 03/27/2013 0500    BASOSABS 0.0 07/30/2018 1033   BASOSABS 0.0 03/27/2013 0500    No results found for: POCLITH, LITHIUM   No results found for: PHENYTOIN, PHENOBARB, VALPROATE, CBMZ   .res Assessment: Plan:    Bipolar I disorder with seasonal pattern ([place])  Generalized anxiety disorder  PTSD (post-traumatic stress disorder)  Panic disorder with agoraphobia  B12 deficiency   [name] [name] has a history of bipolar disorder with heavy seasonal component but has had no seasonal depression so far this year.  She was recently diagnosed with low B12 but has not obtained the injections yet.  I will send that in again for her as a renewal and then have her primary care doctor take it over.  Cont meds DT benefit from them.  Disc mood issues and estrogen and progesterone and risks with and without.  Option NAC after gets B12 level optimized.  This could help the forgetfulness.  We discussed how low B12 levels are often associated with cognitive problems among other neurologic symptoms.  She still needs clonazepam for the anxiety but has been able to reduce it to the current level.  She is also been able to reduce the sertraline from 100 mg to 50 mg without persistent difficulty.  She does not feel ready to stop it until the spring.  We discussed the short-term risks associated with benzodiazepines including sedation and increased fall risk among others.  Discussed long-term side effect risk including dependence, potential withdrawal symptoms, and the potential eventual dose-related risk of dementia.  This appointment was 30 minutes  FU 6 mos  Lynder Parents, MD, DFAPA    Please see After Visit Summary for patient specific instructions.  No future appointments.  No orders of the defined types were placed in this encounter.     -------------------------------

## 2018-08-26 ENCOUNTER — Other Ambulatory Visit: Payer: Self-pay | Admitting: Psychiatry

## 2018-08-26 ENCOUNTER — Other Ambulatory Visit: Payer: Self-pay | Admitting: Obstetrics and Gynecology

## 2018-08-27 ENCOUNTER — Other Ambulatory Visit: Payer: Self-pay | Admitting: Psychiatry

## 2018-08-30 ENCOUNTER — Ambulatory Visit (INDEPENDENT_AMBULATORY_CARE_PROVIDER_SITE_OTHER): Payer: Managed Care, Other (non HMO) | Admitting: Obstetrics and Gynecology

## 2018-08-30 ENCOUNTER — Encounter: Payer: Self-pay | Admitting: Obstetrics and Gynecology

## 2018-08-30 VITALS — BP 121/73 | HR 76 | Ht 65.0 in | Wt 199.1 lb

## 2018-08-30 DIAGNOSIS — Z90722 Acquired absence of ovaries, bilateral: Secondary | ICD-10-CM

## 2018-08-30 DIAGNOSIS — Z9079 Acquired absence of other genital organ(s): Secondary | ICD-10-CM

## 2018-08-30 DIAGNOSIS — N809 Endometriosis, unspecified: Secondary | ICD-10-CM

## 2018-08-30 DIAGNOSIS — Z90710 Acquired absence of both cervix and uterus: Secondary | ICD-10-CM

## 2018-08-30 DIAGNOSIS — E8941 Symptomatic postprocedural ovarian failure: Secondary | ICD-10-CM

## 2018-08-30 DIAGNOSIS — Z4889 Encounter for other specified surgical aftercare: Secondary | ICD-10-CM

## 2018-08-30 DIAGNOSIS — M5430 Sciatica, unspecified side: Secondary | ICD-10-CM

## 2018-08-30 DIAGNOSIS — K5903 Drug induced constipation: Secondary | ICD-10-CM

## 2018-08-30 DIAGNOSIS — N8003 Adenomyosis of the uterus: Secondary | ICD-10-CM | POA: Insufficient documentation

## 2018-08-30 MED ORDER — HYDROMORPHONE HCL 2 MG PO TABS: 2.0000 mg | ORAL_TABLET | Freq: Four times a day (QID) | ORAL | 0 refills | Status: DC | PRN

## 2018-08-30 NOTE — Progress Notes (Signed)
OBSTETRICS/GYNECOLOGY POST-OPERATIVE CLINIC VISIT  Subjective:     [name] [name] is a 53 y.o. female who presents to the clinic 3 weeks status post total abdominal hysterectomy and bilateral oophorectomy for menorrhagia and dysmenorrhea. Eating a regular diet without difficulty.Patient is having pain with pinched nerve.  Notes surgical pain does not require pain medication. Has been using her pain medication to help treat her pinched nerve but is running out (is mostly using at night).   Notes that she also does not have the urge to go the restroom since the surgery.  In addition, she is noting pain with bowel movements, now taking stool softeners, and delayed transit.  Lastly she is also having hot flashes since her surgery.   The following portions of the patient's history were reviewed and updated as appropriate: allergies, current medications, past family history, past medical history, past social history, past surgical history and problem list.  Review of Systems Pertinent items noted in HPI and remainder of comprehensive ROS otherwise negative.    Objective:    BP 121/73   Pulse 76   Ht 5\' 5"  (1.651 m)   Wt 199 lb 1.6 oz (90.3 kg)   LMP  (LMP Unknown)   BMI 33.13 kg/m  General:  alert and no distress  Abdomen: soft, bowel sounds active, non-tender  Incision:   healing well, no drainage, no erythema, no hernia, no seroma, no swelling, no dehiscence, incision well approximated    Pathology:   DIAGNOSIS:  A. UTERUS WITH CERVIX, BILATERAL FALLOPIAN TUBES AND OVARIES; TOTAL  HYSTERECTOMY WITH BILATERAL SALPINGO-OOPHORECTOMY:  - BENIGN TRANSFORMATION ZONE; NEGATIVE FOR SQUAMOUS INTRAEPITHELIAL  LESION AND MALIGNANCY.  - DISORDERED PROLIFERATIVE ENDOMETRIUM; NEGATIVE FOR ATYPICAL  HYPERPLASIA/EIN AND MALIGNANCY.  - ADENOMYOSIS.  - LEIOMYOMATA UTERI; NEGATIVE FOR FEATURES OF MALIGNANCY.  - BILATERAL FALLOPIAN TUBES WITH NO SIGNIFICANT HISTOPATHOLOGIC CHANGE.  - BILATERAL  OVARIES WITH CYSTIC AND HEMORRHAGIC FOLLICLES; OTHERWISE  BENIGN PHYSIOLOGIC CHANGES.   Assessment:    Postoperative course complicated by :    1. Pinched nerve (sciatic nerve)   2. Constipation   3. Hot flushes (surgical menopause) Adenomyosis Plan:   1. Continue any current medications. Will refill pain medication until patient is able to see her chiropractor next week. Also discussed stretches for nerves. Advised to continue stool softener.  2. Patient with bothersome menopausal vasomotor symptoms. Discussed lifestyle interventions such as wearing light clothing, remaining in cool environments, having fan/air conditioner in the room, avoiding hot beverages etc.  Discussed using hormone therapy and concerns about increased risk of heart disease, cerebrovascular disease, thromboembolic disease,  and breast cancer.  Also discussed other medical options such as Paxil, Effexor, Brisdelle, Clonidine,  or Neurontin.   Also discussed alternative therapies such as herbal remedies but cautioned that most of the products contained phytoestrogens (plant estrogens) in unregulated amounts which can have the same effects on the body as the pharmaceutical estrogen preparations.  Also referred her to www.menopause.org for other alternative options.  Patient desires to think over her options, but thinks she may try OTC herbal remedies first. Also notes that she was recently prescribed Gabapentin by her PCP for a different issue, but has not initiated as of yet but will now try if it will also help her hot flushes.  3. Operative findings reviewed. Pathology report discussed. Hysterectomy curative for adenomyosis. 4. Activity restrictions: no excessive bending, stooping, or squatting, no lifting more than 10-15 pounds and pelvic rest x 3 weeks 5. Anticipated return  to work: 2-3 weeks. 6. Follow up: 3 weeks for final post-operative visit.    Rubie Maid, MD [facility]

## 2018-08-30 NOTE — Progress Notes (Signed)
Pt is present today to discuss having a hysterectomy. Pt stated that she is having really bad hot flashes.

## 2018-08-31 ENCOUNTER — Encounter: Payer: Self-pay | Admitting: Obstetrics and Gynecology

## 2018-09-11 ENCOUNTER — Telehealth: Payer: Self-pay

## 2018-09-11 ENCOUNTER — Other Ambulatory Visit: Payer: Self-pay | Admitting: Obstetrics and Gynecology

## 2018-09-11 NOTE — Telephone Encounter (Signed)
Call transferred from front desk- Pt not happy b/c her FMLA payment has been on hold d/t [place] not filling out a surgery confirmation sheet. She has not been pd for 6 weeks.   Pt was in the office on 09/03/2018 asking about her FMLA paperwork. She spoke with VT who let her know that we did not fax her paperwork. She came by and picked up. Chalonda asked if VT could fax it and she did with a confirmation.    Zissy informed  VT that Jamie(case manger with [employer]) did not need the original paperwork faxed in. They needed the surgery confirmation faxed in. VT made Amelia aware that when we received it she would put it in the drs box to sign and fax in.   VT and JW never received the surgery confirmation.   I spoke with Manuela Schwartz from the [employer] who took all information verbally. She did fax the surgery confirmation over but stated I did not need to fill it out. Manuela Schwartz is going to give all information to Cat[facility] and she is to approve it. The patient will be notified via email from [employer] once this done.   I apologized to the pt. Informed her if there was anything else I needed to do to please contact me.   Will contact pt in a few days to confirm she received everything and doesn't need any additional info from [place].

## 2018-09-13 NOTE — Telephone Encounter (Signed)
Called pt to ensure she and the [facility] recieved all necessary documentation. Hayslee sates they did. Her paperwork was expedited.   I apologized to the pt again and let her know if she needed anything else to call and ask to speak to me.   Pt appreciative of the call.

## 2018-09-19 ENCOUNTER — Encounter: Payer: Managed Care, Other (non HMO) | Admitting: Obstetrics and Gynecology

## 2018-09-23 ENCOUNTER — Encounter: Payer: Self-pay | Admitting: Obstetrics and Gynecology

## 2018-09-23 ENCOUNTER — Ambulatory Visit (INDEPENDENT_AMBULATORY_CARE_PROVIDER_SITE_OTHER): Payer: Managed Care, Other (non HMO) | Admitting: Obstetrics and Gynecology

## 2018-09-23 VITALS — BP 153/86 | HR 74 | Ht 65.0 in | Wt 201.0 lb

## 2018-09-23 DIAGNOSIS — Z9079 Acquired absence of other genital organ(s): Secondary | ICD-10-CM

## 2018-09-23 DIAGNOSIS — N8003 Adenomyosis of the uterus: Secondary | ICD-10-CM

## 2018-09-23 DIAGNOSIS — Z90710 Acquired absence of both cervix and uterus: Secondary | ICD-10-CM

## 2018-09-23 DIAGNOSIS — Z90722 Acquired absence of ovaries, bilateral: Secondary | ICD-10-CM

## 2018-09-23 DIAGNOSIS — N809 Endometriosis, unspecified: Secondary | ICD-10-CM

## 2018-09-23 DIAGNOSIS — Z1239 Encounter for other screening for malignant neoplasm of breast: Secondary | ICD-10-CM

## 2018-09-23 DIAGNOSIS — N951 Menopausal and female climacteric states: Secondary | ICD-10-CM

## 2018-09-23 DIAGNOSIS — Z4889 Encounter for other specified surgical aftercare: Secondary | ICD-10-CM

## 2018-09-23 MED ORDER — ESTRADIOL 0.05 MG/24HR TD PTWK: 0.0500 mg | MEDICATED_PATCH | TRANSDERMAL | 12 refills | Status: DC

## 2018-09-23 NOTE — Progress Notes (Signed)
Pt is present today for post-op visit. Pt stated that she was doing well. No complaints.

## 2018-09-23 NOTE — Progress Notes (Signed)
    OBSTETRICS/GYNECOLOGY POST-OPERATIVE CLINIC VISIT  Subjective:     [name] [name] is a 53 y.o. female who presents to the clinic 6 weeks status post total abdominal hysterectomy and bilateral oophorectomy for menorrhagia and dysmenorrhea. Eating a regular diet without difficulty.Patient denies pain. .   Patient reports hot flashes have continued since her last visit. She tried OTC herbal remedies with no relief.  Did not try Gabapentin as instructed. Notes that she would just like to try hormonal patches.   The following portions of the patient's history were reviewed and updated as appropriate: allergies, current medications, past family history, past medical history, past social history, past surgical history and problem list.  Review of Systems Pertinent items noted in HPI and remainder of comprehensive ROS otherwise negative.    Objective:    BP (!) 153/86   Pulse 74   Ht 5\' 5"  (1.651 m)   Wt 201 lb (91.2 kg)   LMP  (LMP Unknown)   BMI 33.45 kg/m  General:  alert and no distress  Abdomen: soft, bowel sounds active, non-tender  Incision:   healing well, no drainage, no erythema, no hernia, no seroma, no swelling, no dehiscence, incision well approximated    Pathology:   DIAGNOSIS:  A. UTERUS WITH CERVIX, BILATERAL FALLOPIAN TUBES AND OVARIES; TOTAL  HYSTERECTOMY WITH BILATERAL SALPINGO-OOPHORECTOMY:  - BENIGN TRANSFORMATION ZONE; NEGATIVE FOR SQUAMOUS INTRAEPITHELIAL  LESION AND MALIGNANCY.  - DISORDERED PROLIFERATIVE ENDOMETRIUM; NEGATIVE FOR ATYPICAL  HYPERPLASIA/EIN AND MALIGNANCY.  - ADENOMYOSIS.  - LEIOMYOMATA UTERI; NEGATIVE FOR FEATURES OF MALIGNANCY.  - BILATERAL FALLOPIAN TUBES WITH NO SIGNIFICANT HISTOPATHOLOGIC CHANGE.  - BILATERAL OVARIES WITH CYSTIC AND HEMORRHAGIC FOLLICLES; OTHERWISE  BENIGN PHYSIOLOGIC CHANGES.   Assessment:   Doing well postoperatively. Hot flushes (surgical menopause) Adenomyosis  Plan:   1. Overall doing well.  Postoperative course completed.  2. Patient with bothersome menopausal vasomotor symptoms. Would like to begin HRT.  Discussed using hormone therapy and concerns about increased risk of heart disease, cerebrovascular disease, thromboembolic disease,  and breast cancer. She understands the benefits as well as the risks discussed above. She  is advised that she may wish to discontinue the HRT at any time, and encouraged to discuss this with her other physicians also. Her personal risk factors have been reviewed carefully with her today. Estradiol 0.5 mg weekly patches prescribed, will increase to 1 mg if no alleviation of symptoms. She will return in 6 weeks for reevaluation.  Can also still try the Gabapentin (prescribed by a different provider for different reason, but can have good effects on hot flushes as well).   3. Operative findings reviewed last visit. Pathology report with noted adenomyosis. Hysterectomy curative for adenomyosis. 4. Activity restrictions: none. Has returned to work.  5. Follow up: 6 weeks  for annual exam and f/u on vasomotor symptoms. Is also due for a mammogram. Will order. Can schedule and have performed prior to annual exam.    Rubie Maid, MD [facility]

## 2018-09-23 NOTE — Patient Instructions (Signed)

## 2018-10-08 ENCOUNTER — Telehealth: Payer: Self-pay | Admitting: Obstetrics and Gynecology

## 2018-10-08 DIAGNOSIS — N3090 Cystitis, unspecified without hematuria: Secondary | ICD-10-CM

## 2018-10-08 NOTE — Telephone Encounter (Signed)
Patient called stating she is still having bladder issues and would like a referral to [facility]. Thanks

## 2018-10-08 NOTE — Telephone Encounter (Signed)
Pt called and informed that her referral to [facility] had been done.

## 2018-11-05 ENCOUNTER — Ambulatory Visit: Payer: Managed Care, Other (non HMO) | Admitting: Urology

## 2018-11-08 ENCOUNTER — Encounter: Payer: Managed Care, Other (non HMO) | Admitting: Obstetrics and Gynecology

## 2018-11-28 ENCOUNTER — Encounter: Payer: Self-pay | Admitting: Obstetrics and Gynecology

## 2018-11-28 ENCOUNTER — Encounter: Payer: Managed Care, Other (non HMO) | Admitting: Obstetrics and Gynecology

## 2018-11-28 ENCOUNTER — Other Ambulatory Visit: Payer: Self-pay

## 2018-11-28 ENCOUNTER — Ambulatory Visit (INDEPENDENT_AMBULATORY_CARE_PROVIDER_SITE_OTHER): Payer: Managed Care, Other (non HMO) | Admitting: Obstetrics and Gynecology

## 2018-11-28 VITALS — Ht 65.0 in | Wt 208.0 lb

## 2018-11-28 DIAGNOSIS — E8940 Asymptomatic postprocedural ovarian failure: Secondary | ICD-10-CM | POA: Diagnosis not present

## 2018-11-28 DIAGNOSIS — Z79890 Hormone replacement therapy (postmenopausal): Secondary | ICD-10-CM

## 2018-11-28 DIAGNOSIS — E559 Vitamin D deficiency, unspecified: Secondary | ICD-10-CM

## 2018-11-28 DIAGNOSIS — K219 Gastro-esophageal reflux disease without esophagitis: Secondary | ICD-10-CM | POA: Diagnosis not present

## 2018-11-28 DIAGNOSIS — Z862 Personal history of diseases of the blood and blood-forming organs and certain disorders involving the immune mechanism: Secondary | ICD-10-CM

## 2018-11-28 MED ORDER — FUSION PLUS PO CAPS: 1.0000 | ORAL_CAPSULE | Freq: Every day | ORAL | 1 refills | Status: DC

## 2018-11-28 MED ORDER — VITAMIN D3 125 MCG (5000 UT) PO CAPS: 1.0000 | ORAL_CAPSULE | Freq: Every day | ORAL | 11 refills | Status: DC

## 2018-11-28 MED ORDER — OMEPRAZOLE 20 MG PO CPDR: 20.0000 mg | DELAYED_RELEASE_CAPSULE | Freq: Every day | ORAL | 6 refills | Status: DC

## 2018-11-28 NOTE — Progress Notes (Signed)
Telephone visit: pt routed from front desk. CC, vitals, medications and allergies reviewed and updated. Pt stated that the patches estradiol are helping a lot with her menopausal symptoms.

## 2018-11-28 NOTE — Progress Notes (Signed)
Virtual Visit via Telephone Note  I connected with [name] [name] on 11/28/18 at  2:15 PM EDT by telephone and verified that I am speaking with the correct person using two identifiers.   I discussed the limitations, risks, security and privacy concerns of performing an evaluation and management service by telephone and the availability of in person appointments. I also discussed with the patient that there may be a patient responsible charge related to this service. The patient expressed understanding and agreed to proceed.   History of Present Illness: [name] [name] is a 53 y.o. [phone] female who presents for follow up of surgical menopausal symptoms s/p hysterectomy with BSO in December.  Was initiated on Estradiol 0.5 mg patches after failed trial of OTC herbal remedies in February. Patient overall notes that her symptoms have greatly improved.  Would like to continue the medication as she feels it is helping.  She is concerned about her 7 lb weight gain over the past month, wonders if it is attributable to the medication.   Patient also requests a refill of her Vitamin D, iron tablets, and her Omeprazole.     Observations/Objective: Height 5\' 5"  (1.651 m), weight 208 lb (94.3 kg) (patient reported).   Assessment and Plan: 1. Surgical menopause on HRT - patient desires to continue use of HRT.  Has refills for the remainder of the year.  Will assess symptoms yearly to see if continued use necessary. Discussed that weight changes can be common after menopause, however usually can be tapered with appropriate diet and lifestyle modifications.  2. Vitamin D insufficiency - was taking Vitamin D 50,000 IU weekly, has completed 12 week course and can now decrease to daily 5,000 IU. Discussed with patient. Will prescribe.  3. GERD - desires refill on Omeprazole.  Will prescribe.  4. H/o anemia, currently on daily iron therapy. Will refill.   Follow Up Instructions:   I discussed the  assessment and treatment plan with the patient. The patient was provided an opportunity to ask questions and all were answered. The patient agreed with the plan and demonstrated an understanding of the instructions.   The patient was advised to call back or seek an in-person evaluation if the symptoms worsen or if the condition fails to improve as anticipated.  I provided 10 minutes of non-face-to-face time during this encounter.   Rubie Maid, MD [facility]

## 2018-12-12 ENCOUNTER — Telehealth: Payer: Self-pay

## 2018-12-12 ENCOUNTER — Other Ambulatory Visit: Payer: Self-pay | Admitting: Cardiovascular Disease

## 2018-12-12 MED ORDER — METOPROLOL TARTRATE 25 MG PO TABS: 25.0000 mg | ORAL_TABLET | Freq: Every day | ORAL | 0 refills | Status: DC

## 2018-12-12 NOTE — Telephone Encounter (Signed)

## 2018-12-14 NOTE — Progress Notes (Signed)
Virtual Visit via Video Note   This visit type was conducted due to national recommendations for restrictions regarding the COVID-19 Pandemic (e.g. social distancing) in an effort to limit this patient's exposure and mitigate transmission in our community.  Due to her co-morbid illnesses, this patient is at least at moderate risk for complications without adequate follow up.  This format is felt to be most appropriate for this patient at this time.  All issues noted in this document were discussed and addressed.  A limited physical exam was performed with this format.  Please refer to the patient's chart for her consent to telehealth for [facility].   I connected with  [name] on 12/16/18 by a video enabled telemedicine application and verified that I am speaking with the correct person using two identifiers. I discussed the limitations of evaluation and management by telemedicine. The patient expressed understanding and agreed to proceed.   Evaluation Performed:  Follow-up visit  Date:  12/16/2018   ID:  [name], [name] May 28, 1966, MRN [id number]  Patient Location:  [address] [place] [address]   Provider location:   [facility], [place] office  PCP:  Lorelee Market, MD  Cardiologist:  Patsy Baltimore   Chief Complaint:  GERD sx   History of Present Illness:    [name] [name] is a 53 y.o. female who presents via audio/video conferencing for a telehealth visit today.   The patient does not symptoms concerning for COVID-19 infection (fever, chills, cough, or new SHORTNESS OF BREATH).   Patient has a past medical history of GERD,  hospital 03/27/2013 with atrial fibrillation with RVR, heart rate 140s Reported this was in the setting of severe stress, at the time had a 70-year-old started on Cardizem, beta blocker with improved heart rate control, converting to normal sinus rhythm  discharged on Cardizem 4 times a day, metoprolol 25  mg twice a day. She presents today for follow-up of her paroxysmal atrial fibrillation  Trip to ER 12/26/2016 She likely had a panic attack last night and I will prescribe anxiety medicine for her. No more chest pain  Denies atrial fib episodes, Gone through her medications today she is taking metoprolol tartrate 25 mg once a day for unclear reasons Has Jerrye Bushy Takes omeprazole Occasionally has breakthrough Previously seen by GI, told she might have a hiatal hernia  Lab work reviewed with her pain HBA1C 5.4 Total chol 180 nonsmoker  Was previously on metoprolol and flecainide but she stopped flecainide Previously metoprolol was giving her fatigue  Weight continues to trend up, previously was drinking lots of sweet tea  Other past medical history reviewed stopped all of her meds in May 2015,  She was doing well until October 2015, when she started having sx of fluttering, hard heart beats.  Echocardiogram August 2014 showing normal LV systolic function, LV diastolic relaxation abnormality, normal right ventricular systolic pressure TSH in the hospital was normal, 1.9  Dad with CAD, smoker hold   Prior CV studies:   The following studies were reviewed today:    Past Medical History:  Diagnosis Date  . A-fib ([place])   . Anemia   . Anxiety   . Arthritis   . B12 deficiency   . Chicken pox   . Depression   . Fibroid   . GERD (gastroesophageal reflux disease)   . History of hiatal hernia   . Hypertension   . Migraines    Past  Surgical History:  Procedure Laterality Date  . APPENDECTOMY  5/11  . C-spine surgery    . CESAREAN SECTION  10/98,9/12  . HYSTERECTOMY ABDOMINAL WITH SALPINGO-OOPHORECTOMY Bilateral 08/05/2018   Procedure: HYSTERECTOMY ABDOMINAL WITH BILATERAL SALPINGO-OOPHORECTOMY;  Surgeon: Brayton Mars, MD;  Location: [facility] ORS;  Service: Gynecology;  Laterality: Bilateral;  . TONSILLECTOMY  03/2008  . TUBAL LIGATION       Current Meds   Medication Sig  . Cholecalciferol (VITAMIN D3) 125 MCG (5000 UT) CAPS Take 1 capsule (5,000 Units total) by mouth daily.  . clonazePAM (KLONOPIN) 0.5 MG tablet Take 1 tablet (0.5 mg total) by mouth daily as needed for anxiety.  . cyanocobalamin (,VITAMIN B-12,) 1000 MCG/ML injection Inject 1 mL (1,000 mcg total) into the muscle every 30 (thirty) days.  Marland Kitchen estradiol (CLIMARA - DOSED IN MG/24 HR) 0.05 mg/24hr patch Place 1 patch (0.05 mg total) onto the skin once a week.  . fluticasone (FLONASE) 50 MCG/ACT nasal spray Place 1 spray into both nostrils daily as needed for congestion.  Marland Kitchen ibuprofen (ADVIL,MOTRIN) 600 MG tablet TAKE 1 TABLET BY MOUTH FOUR TIMES DAILY  . Iron-FA-B Cmp-C-Biot-Probiotic (FUSION PLUS) CAPS Take 1 capsule by mouth at bedtime.  . lamoTRIgine (LAMICTAL) 200 MG tablet TAKE 1 TABLET BY MOUTH  EVERY NIGHT AT BEDTIME  . metoprolol tartrate (LOPRESSOR) 25 MG tablet Take 1 tablet (25 mg total) by mouth at bedtime.  . Multiple Vitamin (MULTIVITAMIN WITH MINERALS) TABS tablet Take 1 tablet by mouth at bedtime.  Marland Kitchen omeprazole (PRILOSEC) 20 MG capsule Take 1 capsule (20 mg total) by mouth at bedtime.  . Oxcarbazepine (TRILEPTAL) 300 MG tablet TAKE 3 TABLETS BY MOUTH  EVERY NIGHT AT BEDTIME  . sertraline (ZOLOFT) 50 MG tablet Take 1 tablet (50 mg total) by mouth daily.     Allergies:   Phenylmercuric nitrate; Augmentin [amoxicillin-pot clavulanate]; Codeine; Diclofenac; Latex; Neomycin; and Percocet [oxycodone-acetaminophen]   Social History   Tobacco Use  . Smoking status: Former Smoker    Packs/day: 1.00    Years: 10.00    Pack years: 10.00    Types: Cigarettes    Last attempt to quit: 08/22/1991    Years since quitting: 27.3  . Smokeless tobacco: Never Used  Substance Use Topics  . Alcohol use: Yes    Alcohol/week: 0.0 standard drinks    Comment: twice a year   . Drug use: Not Currently    Types: Marijuana    Comment: not for a couple years     Current Outpatient  Medications on File Prior to Visit  Medication Sig Dispense Refill  . Cholecalciferol (VITAMIN D3) 125 MCG (5000 UT) CAPS Take 1 capsule (5,000 Units total) by mouth daily. 30 capsule 11  . clonazePAM (KLONOPIN) 0.5 MG tablet Take 1 tablet (0.5 mg total) by mouth daily as needed for anxiety. 30 tablet 5  . cyanocobalamin (,VITAMIN B-12,) 1000 MCG/ML injection Inject 1 mL (1,000 mcg total) into the muscle every 30 (thirty) days. 10 mL 1  . estradiol (CLIMARA - DOSED IN MG/24 HR) 0.05 mg/24hr patch Place 1 patch (0.05 mg total) onto the skin once a week. 4 patch 12  . fluticasone (FLONASE) 50 MCG/ACT nasal spray Place 1 spray into both nostrils daily as needed for congestion.  0  . ibuprofen (ADVIL,MOTRIN) 600 MG tablet TAKE 1 TABLET BY MOUTH FOUR TIMES DAILY 50 tablet 0  . Iron-FA-B Cmp-C-Biot-Probiotic (FUSION PLUS) CAPS Take 1 capsule by mouth at bedtime. 60 capsule 1  .  lamoTRIgine (LAMICTAL) 200 MG tablet TAKE 1 TABLET BY MOUTH  EVERY NIGHT AT BEDTIME 90 tablet 1  . metoprolol tartrate (LOPRESSOR) 25 MG tablet Take 1 tablet (25 mg total) by mouth at bedtime. 30 tablet 0  . Multiple Vitamin (MULTIVITAMIN WITH MINERALS) TABS tablet Take 1 tablet by mouth at bedtime.    Marland Kitchen omeprazole (PRILOSEC) 20 MG capsule Take 1 capsule (20 mg total) by mouth at bedtime. 60 capsule 6  . Oxcarbazepine (TRILEPTAL) 300 MG tablet TAKE 3 TABLETS BY MOUTH  EVERY NIGHT AT BEDTIME 270 tablet 1  . sertraline (ZOLOFT) 50 MG tablet Take 1 tablet (50 mg total) by mouth daily. 90 tablet 1   No current facility-administered medications on file prior to visit.      Family Hx: The patient's family history includes Alcohol abuse in her father, maternal aunt, maternal uncle, and paternal grandfather; Arthritis in her father, maternal grandmother, and sister; Diabetes in her mother; Heart disease in her father; Schizophrenia in her maternal grandmother. There is no history of Breast cancer.  ROS:   Please see the history of  present illness.    Review of Systems  Constitutional: Negative.   Respiratory: Negative.   Cardiovascular: Negative.   Gastrointestinal: Negative.   Musculoskeletal: Negative.   Neurological: Negative.   Psychiatric/Behavioral: Negative.   All other systems reviewed and are negative.    Labs/Other Tests and Data Reviewed:    Recent Labs: 05/31/2018: ALT 12; BUN 14; Creatinine, Ser 0.84; Potassium 4.3; Sodium 139; TSH 0.880 07/30/2018: Platelets 449 08/06/2018: Hemoglobin 10.1   Recent Lipid Panel Lab Results  Component Value Date/Time   CHOL 181 05/31/2018 03:30 PM   CHOL 173 03/27/2013 05:00 AM   TRIG 159 (H) 05/31/2018 03:30 PM   TRIG 84 03/27/2013 05:00 AM   HDL 68 05/31/2018 03:30 PM   HDL 45 03/27/2013 05:00 AM   CHOLHDL 2.7 05/31/2018 03:30 PM   LDLCALC 81 05/31/2018 03:30 PM   LDLCALC 111 (H) 03/27/2013 05:00 AM    Wt Readings from Last 3 Encounters:  11/28/18 208 lb (94.3 kg)  09/23/18 201 lb (91.2 kg)  08/30/18 199 lb 1.6 oz (90.3 kg)     Exam:    Vital Signs: Vital signs may also be detailed in the HPI LMP  (LMP Unknown)   Wt Readings from Last 3 Encounters:  11/28/18 208 lb (94.3 kg)  09/23/18 201 lb (91.2 kg)  08/30/18 199 lb 1.6 oz (90.3 kg)   Temp Readings from Last 3 Encounters:  08/07/18 98.2 F (36.8 C) (Oral)  05/18/18 98.7 F (37.1 C) (Oral)  12/26/16 98 F (36.7 C) (Oral)   BP Readings from Last 3 Encounters:  09/23/18 (!) 153/86  08/30/18 121/73  08/07/18 (!) 149/69   Pulse Readings from Last 3 Encounters:  09/23/18 74  08/30/18 76  08/07/18 73    120/80, 70s, resp 16  Well nourished, well developed female in no acute distress. Constitutional:  oriented to person, place, and time. No distress.  Head: Normocephalic and atraumatic.  Eyes:  no discharge. No scleral icterus.  Neck: Normal range of motion. Neck supple.  Pulmonary/Chest: No audible wheezing, no distress, appears comfortable Musculoskeletal: Normal range of  motion.  no  tenderness or deformity.  Neurological:   Coordination normal. Full exam not performed Skin:  No rash Psychiatric:  normal mood and affect. behavior is normal. Thought content normal.    ASSESSMENT & PLAN:    Paroxysmal atrial fibrillation (HCC) - Plan: EKG 12-Lead  Recommend she take metoprolol succinate 25 mg daily Previously was taking metoprolol tartrate 25 mg at nighttime Denies symptoms concerning for atrial fibrillation  Bipolar I disorder, most recent episode depressed ([place]) - Plan: EKG 12-Lead Reports her symptoms are stable Managed by primary care  Gastroesophageal reflux disease without esophagitis - Plan: EKG 12-Lead Takes omeprazole, occasion with breakthrough symptoms Discussed taking Tums and Pepcid as needed for breakthrough  Chest pain, unspecified type - Plan: EKG 12-Lead Suspect secondary to GERD Recent episode 2018 work-up negative no further episodes since that time She was told it might be a panic attack  Leg swelling - Plan: EKG 12-Lead Likely from weight gain, unable to exclude fluid retention Recommended lifestyle modification for weight loss    COVID-19 Education: The signs and symptoms of COVID-19 were discussed with the patient and how to seek care for testing (follow up with PCP or arrange E-visit).  The importance of social distancing was discussed today.  Patient Risk:   After full review of this patients clinical status, I feel that they are at least moderate risk at this time.  Time:   Today, I have spent 25 minutes with the patient with telehealth technology discussing the cardiac and medical problems/diagnoses detailed above   10 min spent reviewing the chart prior to patient visit today   Medication Adjustments/Labs and Tests Ordered: Current medicines are reviewed at length with the patient today.  Concerns regarding medicines are outlined above.   Tests Ordered: No tests ordered   Medication Changes: No changes  made   Disposition: Follow-up in 12 months   Signed, Ida Rogue, MD  12/16/2018 8:31 AM    [place] Office [address] [place] [address], [place], [place] [address]

## 2018-12-16 ENCOUNTER — Other Ambulatory Visit: Payer: Self-pay

## 2018-12-16 ENCOUNTER — Telehealth (INDEPENDENT_AMBULATORY_CARE_PROVIDER_SITE_OTHER): Payer: Managed Care, Other (non HMO) | Admitting: Cardiovascular Disease

## 2018-12-16 DIAGNOSIS — G43109 Migraine with aura, not intractable, without status migrainosus: Secondary | ICD-10-CM

## 2018-12-16 DIAGNOSIS — R079 Chest pain, unspecified: Secondary | ICD-10-CM

## 2018-12-16 DIAGNOSIS — R0789 Other chest pain: Secondary | ICD-10-CM | POA: Diagnosis not present

## 2018-12-16 DIAGNOSIS — I480 Paroxysmal atrial fibrillation: Secondary | ICD-10-CM

## 2018-12-16 DIAGNOSIS — D649 Anemia, unspecified: Secondary | ICD-10-CM

## 2018-12-16 DIAGNOSIS — M7989 Other specified soft tissue disorders: Secondary | ICD-10-CM

## 2018-12-16 MED ORDER — METOPROLOL SUCCINATE ER 25 MG PO TB24: 25.0000 mg | ORAL_TABLET | Freq: Every day | ORAL | 3 refills | Status: DC

## 2018-12-16 NOTE — Patient Instructions (Addendum)
Medication Instructions: Your physician has recommended you make the following change in your medication:  1. START Metoprolol Succinate 25 mg once a day (Sent into [facility]) 2. STOP Metoprolol tartrate   If you need a refill on your cardiac medications before your next appointment, please call your pharmacy.    Lab work: No new labs needed   If you have labs (blood work) drawn today and your tests are completely normal, you will receive your results only by: Marland Kitchen MyChart Message (if you have MyChart) OR . A paper copy in the mail If you have any lab test that is abnormal or we need to change your treatment, we will call you to review the results.   Testing/Procedures: No new testing needed   Follow-Up: At [facility], you and your health needs are our priority.  As part of our continuing mission to provide you with exceptional heart care, we have created designated Provider Care Teams.  These Care Teams include your primary Cardiologist (physician) and Advanced Practice Providers (APPs -  Physician Assistants and Nurse Practitioners) who all work together to provide you with the care you need, when you need it.  . You will need a follow up appointment in 12 months .   Please call our office 2 months in advance to schedule this appointment.    . Providers on your designated Care Team:   . Murray Hodgkins, NP . Christell Faith, PA-C . Marrianne Mood, PA-C  Any Other Special Instructions Will Be Listed Below (If Applicable).  For educational health videos Log in to : [email or web address] Or : [email or web address], password : [email or web address]

## 2019-01-09 ENCOUNTER — Other Ambulatory Visit: Payer: Self-pay | Admitting: Cardiovascular Disease

## 2019-01-14 ENCOUNTER — Other Ambulatory Visit: Payer: Self-pay | Admitting: Psychiatry

## 2019-02-13 ENCOUNTER — Other Ambulatory Visit: Payer: Self-pay | Admitting: Psychiatry

## 2019-02-13 ENCOUNTER — Telehealth: Payer: Self-pay

## 2019-02-13 NOTE — Progress Notes (Unsigned)
  PT is present today for her annual exam. Pt stated that she is doing well and denies any issues. No problems or concerns.    

## 2019-02-13 NOTE — Telephone Encounter (Signed)
Pt prescreened no symptoms has face mask.   Coronavirus (COVID-19) Are you at risk?  Are you at risk for the Coronavirus (COVID-19)?  To be considered HIGH RISK for Coronavirus (COVID-19), you have to meet the following criteria:  . Traveled to [place], [place], [place], [place] or [place]; or in the [place] to [place], [place], [place], or [place]; and have fever, cough, and shortness of breath within the last 2 weeks of travel OR . Been in close contact with a person diagnosed with COVID-19 within the last 2 weeks and have fever, cough, and shortness of breath . IF YOU DO NOT MEET THESE CRITERIA, YOU ARE CONSIDERED LOW RISK FOR COVID-19.  What to do if you are HIGH RISK for COVID-19?  . If you are having a medical emergency, call 911. . Seek medical care right away. Before you go to a doctor's office, urgent care or emergency department, call ahead and tell them about your recent travel, contact with someone diagnosed with COVID-19, and your symptoms. You should receive instructions from your physician's office regarding next steps of care.  . When you arrive at healthcare provider, tell the healthcare staff immediately you have returned from visiting [place], [place], [place], [place] or [place]; or traveled in the [place] to [place], [place], [place], or [place]; in the last two weeks or you have been in close contact with a person diagnosed with COVID-19 in the last 2 weeks.   . Tell the health care staff about your symptoms: fever, cough and shortness of breath. . After you have been seen by a medical provider, you will be either: o Tested for (COVID-19) and discharged home on quarantine except to seek medical care if symptoms worsen, and asked to  - Stay home and avoid contact with others until you get your results (4-5 days)  - Avoid travel on public transportation if possible (such as bus, train, or airplane) or o Sent to the Emergency Department by EMS for  evaluation, COVID-19 testing, and possible admission depending on your condition and test results.  What to do if you are LOW RISK for COVID-19?  Reduce your risk of any infection by using the same precautions used for avoiding the common cold or flu:  . Wash your hands often with soap and warm water for at least 20 seconds.  If soap and water are not readily available, use an alcohol-based hand sanitizer with at least 60% alcohol.  . If coughing or sneezing, cover your mouth and nose by coughing or sneezing into the elbow areas of your shirt or coat, into a tissue or into your sleeve (not your hands). . Avoid shaking hands with others and consider head nods or verbal greetings only. . Avoid touching your eyes, nose, or mouth with unwashed hands.  . Avoid close contact with people who are sick. . Avoid places or events with large numbers of people in one location, like concerts or sporting events. . Carefully consider travel plans you have or are making. . If you are planning any travel outside or inside the [place], visit the CDC's Travelers' Health webpage for the latest health notices. . If you have some symptoms but not all symptoms, continue to monitor at home and seek medical attention if your symptoms worsen. . If you are having a medical emergency, call 911.   ADDITIONAL HEALTHCARE OPTIONS FOR PATIENTS  [facility] / e-Visit: [email or web address]           [facility]: [phone]  [facility]: [phone]                   [facility]: [phone]  

## 2019-02-14 ENCOUNTER — Ambulatory Visit: Payer: Managed Care, Other (non HMO) | Admitting: Obstetrics and Gynecology

## 2019-02-14 ENCOUNTER — Other Ambulatory Visit: Payer: Self-pay

## 2019-02-14 ENCOUNTER — Encounter: Payer: Self-pay | Admitting: Obstetrics and Gynecology

## 2019-02-14 VITALS — BP 116/74 | HR 80 | Ht 65.0 in | Wt 206.4 lb

## 2019-02-14 NOTE — Patient Instructions (Signed)
Preventive Care 40-64 Years, Female Preventive care refers to lifestyle choices and visits with your health care provider that can promote health and wellness. What does preventive care include?   A yearly physical exam. This is also called an annual well check.  Dental exams once or twice a year.  Routine eye exams. Ask your health care provider how often you should have your eyes checked.  Personal lifestyle choices, including: ? Daily care of your teeth and gums. ? Regular physical activity. ? Eating a healthy diet. ? Avoiding tobacco and drug use. ? Limiting alcohol use. ? Practicing safe sex. ? Taking low-dose aspirin daily starting at age 39. ? Taking vitamin and mineral supplements as recommended by your health care provider. What happens during an annual well check? The services and screenings done by your health care provider during your annual well check will depend on your age, overall health, lifestyle risk factors, and family history of disease. Counseling Your health care provider may ask you questions about your:  Alcohol use.  Tobacco use.  Drug use.  Emotional well-being.  Home and relationship well-being.  Sexual activity.  Eating habits.  Work and work Statistician.  Method of birth control.  Menstrual cycle.  Pregnancy history. Screening You may have the following tests or measurements:  Height, weight, and BMI.  Blood pressure.  Lipid and cholesterol levels. These may be checked every 5 years, or more frequently if you are over 51 years old.  Skin check.  Lung cancer screening. You may have this screening every year starting at age 60 if you have a 30-pack-year history of smoking and currently smoke or have quit within the past 15 years.  Colorectal cancer screening. All adults should have this screening starting at age 55 and continuing until age 13. Your health care provider may recommend screening at age 67. You will have tests every  1-10 years, depending on your results and the type of screening test. People at increased risk should start screening at an earlier age. Screening tests may include: ? Guaiac-based fecal occult blood testing. ? Fecal immunochemical test (FIT). ? Stool DNA test. ? Virtual colonoscopy. ? Sigmoidoscopy. During this test, a flexible tube with a tiny camera (sigmoidoscope) is used to examine your rectum and lower colon. The sigmoidoscope is inserted through your anus into your rectum and lower colon. ? Colonoscopy. During this test, a long, thin, flexible tube with a tiny camera (colonoscope) is used to examine your entire colon and rectum.  Hepatitis C blood test.  Hepatitis B blood test.  Sexually transmitted disease (STD) testing.  Diabetes screening. This is done by checking your blood sugar (glucose) after you have not eaten for a while (fasting). You may have this done every 1-3 years.  Mammogram. This may be done every 1-2 years. Talk to your health care provider about when you should start having regular mammograms. This may depend on whether you have a family history of breast cancer.  BRCA-related cancer screening. This may be done if you have a family history of breast, ovarian, tubal, or peritoneal cancers.  Pelvic exam and Pap test. This may be done every 3 years starting at age 12. Starting at age 25, this may be done every 5 years if you have a Pap test in combination with an HPV test.  Bone density scan. This is done to screen for osteoporosis. You may have this scan if you are at high risk for osteoporosis. Discuss your test results, treatment options,  and if necessary, the need for more tests with your health care provider. Vaccines Your health care provider may recommend certain vaccines, such as:  Influenza vaccine. This is recommended every year.  Tetanus, diphtheria, and acellular pertussis (Tdap, Td) vaccine. You may need a Td booster every 10 years.  Varicella  vaccine. You may need this if you have not been vaccinated.  Zoster vaccine. You may need this after age 31.  Measles, mumps, and rubella (MMR) vaccine. You may need at least one dose of MMR if you were born in 1957 or later. You may also need a second dose.  Pneumococcal 13-valent conjugate (PCV13) vaccine. You may need this if you have certain conditions and were not previously vaccinated.  Pneumococcal polysaccharide (PPSV23) vaccine. You may need one or two doses if you smoke cigarettes or if you have certain conditions.  Meningococcal vaccine. You may need this if you have certain conditions.  Hepatitis A vaccine. You may need this if you have certain conditions or if you travel or work in places where you may be exposed to hepatitis A.  Hepatitis B vaccine. You may need this if you have certain conditions or if you travel or work in places where you may be exposed to hepatitis B.  Haemophilus influenzae type b (Hib) vaccine. You may need this if you have certain conditions. Talk to your health care provider about which screenings and vaccines you need and how often you need them. This information is not intended to replace advice given to you by your health care provider. Make sure you discuss any questions you have with your health care provider. Document Released: 09/03/2015 Document Revised: 09/27/2017 Document Reviewed: 06/08/2015 Elsevier Interactive Patient Education  2019 [employer] Breast self-awareness is knowing how your breasts look and feel. Doing breast self-awareness is important. It allows you to catch a breast problem early while it is still small and can be treated. All women should do breast self-awareness, including women who have had breast implants. Tell your doctor if you notice a change in your breasts. What you need:  A mirror.  A well-lit room. How to do a breast self-exam A breast self-exam is one way to learn what is normal for  your breasts and to check for changes. To do a breast self-exam: Look for changes  1. Take off all the clothes above your waist. 2. Stand in front of a mirror in a room with good lighting. 3. Put your hands on your hips. 4. Push your hands down. 5. Look at your breasts and nipples in the mirror to see if one breast or nipple looks different from the other. Check to see if: ? The shape of one breast is different. ? The size of one breast is different. ? There are wrinkles, dips, and bumps in one breast and not the other. 6. Look at each breast for changes in the skin, such as: ? Redness. ? Scaly areas. 7. Look for changes in your nipples, such as: ? Liquid around the nipples. ? Bleeding. ? Dimpling. ? Redness. ? A change in where the nipples are. Feel for changes  1. Lie on your back on the floor. 2. Feel each breast. To do this, follow these steps: ? Pick a breast to feel. ? Put the arm closest to that breast above your head. ? Use your other arm to feel the nipple area of your breast. Feel the area with the pads of your three middle fingers  by making small circles with your fingers. For the first circle, press lightly. For the second circle, press harder. For the third circle, press even harder. ? Keep making circles with your fingers at the different pressures as you move down your breast. Stop when you feel your ribs. ? Move your fingers a little toward the center of your body. ? Start making circles with your fingers again, this time going up until you reach your collarbone. ? Keep making up-and-down circles until you reach your armpit. Remember to keep using the three pressures. ? Feel the other breast in the same way. 3. Sit or stand in the tub or shower. 4. With soapy water on your skin, feel each breast the same way you did in step 2 when you were lying on the floor. Write down what you find Writing down what you find can help you remember what to tell your doctor. Write  down:  What is normal for each breast.  Any changes you find in each breast, including: ? The kind of changes you find. ? Whether you have pain. ? Size and location of any lumps.  When you last had your menstrual period. General tips  Check your breasts every month.  If you are breastfeeding, the best time to check your breasts is after you feed your baby or after you use a breast pump.  If you get menstrual periods, the best time to check your breasts is 5-7 days after your menstrual period is over.  With time, you will become comfortable with the self-exam, and you will begin to know if there are changes in your breasts. Contact a doctor if you:  See a change in the shape or size of your breasts or nipples.  See a change in the skin of your breast or nipples, such as red or scaly skin.  Have fluid coming from your nipples that is not normal.  Find a lump or thick area that was not there before.  Have pain in your breasts.  Have any concerns about your breast health. Summary  Breast self-awareness includes looking for changes in your breasts, as well as feeling for changes within your breasts.  Breast self-awareness should be done in front of a mirror in a well-lit room.  You should check your breasts every month. If you get menstrual periods, the best time to check your breasts is 5-7 days after your menstrual period is over.  Let your doctor know of any changes you see in your breasts, including changes in size, changes on the skin, pain or tenderness, or fluid from your nipples that is not normal. This information is not intended to replace advice given to you by your health care provider. Make sure you discuss any questions you have with your health care provider. Document Released: 01/24/2008 Document Revised: 03/26/2018 Document Reviewed: 03/26/2018 Elsevier Patient Education  2020 [employer].

## 2019-02-19 ENCOUNTER — Encounter: Payer: Managed Care, Other (non HMO) | Admitting: Obstetrics and Gynecology

## 2019-02-19 ENCOUNTER — Other Ambulatory Visit: Payer: Self-pay | Admitting: Psychiatry

## 2019-02-25 ENCOUNTER — Telehealth: Payer: Self-pay

## 2019-02-25 NOTE — Telephone Encounter (Signed)
Copied from [facility] [phone]. Topic: Appointment Scheduling - Scheduling Inquiry for Clinic >> Feb 25, 2019  4:09 PM Rainey Pines A wrote: Patient would like to establish care with Dr .Olivia Mackie and she also is having some stomach issues and would like a callback.

## 2019-02-25 NOTE — Telephone Encounter (Signed)
Patient said that she has ongoing reflux and stomach issues.  Patient wants to establish care with Dr. Aundra Dubin. Pt was scheduled a virtual appointment on 02/27/19 @ 3:30 pm.

## 2019-02-27 ENCOUNTER — Encounter: Payer: Self-pay | Admitting: Internal Medicine

## 2019-02-27 ENCOUNTER — Ambulatory Visit (INDEPENDENT_AMBULATORY_CARE_PROVIDER_SITE_OTHER): Payer: Managed Care, Other (non HMO) | Admitting: Internal Medicine

## 2019-02-27 ENCOUNTER — Other Ambulatory Visit: Payer: Self-pay

## 2019-02-27 DIAGNOSIS — Z0184 Encounter for antibody response examination: Secondary | ICD-10-CM

## 2019-02-27 DIAGNOSIS — Z780 Asymptomatic menopausal state: Secondary | ICD-10-CM | POA: Diagnosis not present

## 2019-02-27 DIAGNOSIS — Z1389 Encounter for screening for other disorder: Secondary | ICD-10-CM

## 2019-02-27 DIAGNOSIS — D509 Iron deficiency anemia, unspecified: Secondary | ICD-10-CM

## 2019-02-27 DIAGNOSIS — Z1211 Encounter for screening for malignant neoplasm of colon: Secondary | ICD-10-CM

## 2019-02-27 DIAGNOSIS — E538 Deficiency of other specified B group vitamins: Secondary | ICD-10-CM | POA: Diagnosis not present

## 2019-02-27 DIAGNOSIS — Z0000 Encounter for general adult medical examination without abnormal findings: Secondary | ICD-10-CM

## 2019-02-27 DIAGNOSIS — R748 Abnormal levels of other serum enzymes: Secondary | ICD-10-CM

## 2019-02-27 DIAGNOSIS — Z1159 Encounter for screening for other viral diseases: Secondary | ICD-10-CM

## 2019-02-27 DIAGNOSIS — E781 Pure hyperglyceridemia: Secondary | ICD-10-CM | POA: Diagnosis not present

## 2019-02-27 DIAGNOSIS — Z1231 Encounter for screening mammogram for malignant neoplasm of breast: Secondary | ICD-10-CM

## 2019-02-27 DIAGNOSIS — K219 Gastro-esophageal reflux disease without esophagitis: Secondary | ICD-10-CM

## 2019-02-27 DIAGNOSIS — E559 Vitamin D deficiency, unspecified: Secondary | ICD-10-CM

## 2019-02-27 MED ORDER — PANTOPRAZOLE SODIUM 40 MG PO TBEC: 40.0000 mg | DELAYED_RELEASE_TABLET | Freq: Every day | ORAL | 3 refills | Status: DC

## 2019-02-27 NOTE — Patient Instructions (Signed)
Food Choices for Gastroesophageal Reflux Disease, Adult When you have gastroesophageal reflux disease (GERD), the foods you eat and your eating habits are very important. Choosing the right foods can help ease the discomfort of GERD. Consider working with a diet and nutrition specialist (dietitian) to help you make healthy food choices. What general guidelines should I follow?  Eating plan  Choose healthy foods low in fat, such as fruits, vegetables, whole grains, low-fat dairy products, and lean meat, fish, and poultry.  Eat frequent, small meals instead of three large meals each day. Eat your meals slowly, in a relaxed setting. Avoid bending over or lying down until 2-3 hours after eating.  Limit high-fat foods such as fatty meats or fried foods.  Limit your intake of oils, butter, and shortening to less than 8 teaspoons each day.  Avoid the following: ? Foods that cause symptoms. These may be different for different people. Keep a food diary to keep track of foods that cause symptoms. ? Alcohol. ? Drinking large amounts of liquid with meals. ? Eating meals during the 2-3 hours before bed.  Cook foods using methods other than frying. This may include baking, grilling, or broiling. Lifestyle  Maintain a healthy weight. Ask your health care provider what weight is healthy for you. If you need to lose weight, work with your health care provider to do so safely.  Exercise for at least 30 minutes on 5 or more days each week, or as told by your health care provider.  Avoid wearing clothes that fit tightly around your waist and chest.  Do not use any products that contain nicotine or tobacco, such as cigarettes and e-cigarettes. If you need help quitting, ask your health care provider.  Sleep with the head of your bed raised. Use a wedge under the mattress or blocks under the bed frame to raise the head of the bed. What foods are not recommended? The items listed may not be a complete  list. Talk with your dietitian about what dietary choices are best for you. Grains Pastries or quick breads with added fat. French toast. Vegetables Deep fried vegetables. French fries. Any vegetables prepared with added fat. Any vegetables that cause symptoms. For some people this may include tomatoes and tomato products, chili peppers, onions and garlic, and horseradish. Fruits Any fruits prepared with added fat. Any fruits that cause symptoms. For some people this may include citrus fruits, such as oranges, grapefruit, pineapple, and lemons. Meats and other protein foods High-fat meats, such as fatty beef or pork, hot dogs, ribs, ham, sausage, salami and bacon. Fried meat or protein, including fried fish and fried chicken. Nuts and nut butters. Dairy Whole milk and chocolate milk. Sour cream. Cream. Ice cream. Cream cheese. Milk shakes. Beverages Coffee and tea, with or without caffeine. Carbonated beverages. Sodas. Energy drinks. Fruit juice made with acidic fruits (such as orange or grapefruit). Tomato juice. Alcoholic drinks. Fats and oils Butter. Margarine. Shortening. Ghee. Sweets and desserts Chocolate and cocoa. Donuts. Seasoning and other foods Pepper. Peppermint and spearmint. Any condiments, herbs, or seasonings that cause symptoms. For some people, this may include curry, hot sauce, or vinegar-based salad dressings. Summary  When you have gastroesophageal reflux disease (GERD), food and lifestyle choices are very important to help ease the discomfort of GERD.  Eat frequent, small meals instead of three large meals each day. Eat your meals slowly, in a relaxed setting. Avoid bending over or lying down until 2-3 hours after eating.  Limit high-fat   foods such as fatty meat or fried foods. This information is not intended to replace advice given to you by your health care provider. Make sure you discuss any questions you have with your health care provider. Document Released:  08/07/2005 Document Revised: 11/28/2018 Document Reviewed: 08/08/2016 Elsevier Patient Education  2020 [employer].  High Cholesterol  High cholesterol is a condition in which the blood has high levels of a white, waxy, fat-like substance (cholesterol). The human body needs small amounts of cholesterol. The liver makes all the cholesterol that the body needs. Extra (excess) cholesterol comes from the food that we eat. Cholesterol is carried from the liver by the blood through the blood vessels. If you have high cholesterol, deposits (plaques) may build up on the walls of your blood vessels (arteries). Plaques make the arteries narrower and stiffer. Cholesterol plaques increase your risk for heart attack and stroke. Work with your health care provider to keep your cholesterol levels in a healthy range. What increases the risk? This condition is more likely to develop in people who:  Eat foods that are high in animal fat (saturated fat) or cholesterol.  Are overweight.  Are not getting enough exercise.  Have a family history of high cholesterol. What are the signs or symptoms? There are no symptoms of this condition. How is this diagnosed? This condition may be diagnosed from the results of a blood test.  If you are older than age 69, your health care provider may check your cholesterol every 4-6 years.  You may be checked more often if you already have high cholesterol or other risk factors for heart disease. The blood test for cholesterol measures:  "Bad" cholesterol (LDL cholesterol). This is the main type of cholesterol that causes heart disease. The desired level for LDL is less than 100.  "Good" cholesterol (HDL cholesterol). This type helps to protect against heart disease by cleaning the arteries and carrying the LDL away. The desired level for HDL is 60 or higher.  Triglycerides. These are fats that the body can store or burn for energy. The desired number for triglycerides is  lower than 150.  Total cholesterol. This is a measure of the total amount of cholesterol in your blood, including LDL cholesterol, HDL cholesterol, and triglycerides. A healthy number is less than 200. How is this treated? This condition is treated with diet changes, lifestyle changes, and medicines. Diet changes  This may include eating more whole grains, fruits, vegetables, nuts, and fish.  This may also include cutting back on red meat and foods that have a lot of added sugar. Lifestyle changes  Changes may include getting at least 40 minutes of aerobic exercise 3 times a week. Aerobic exercises include walking, biking, and swimming. Aerobic exercise along with a healthy diet can help you maintain a healthy weight.  Changes may also include quitting smoking. Medicines  Medicines are usually given if diet and lifestyle changes have failed to reduce your cholesterol to healthy levels.  Your health care provider may prescribe a statin medicine. Statin medicines have been shown to reduce cholesterol, which can reduce the risk of heart disease. Follow these instructions at home: Eating and drinking If told by your health care provider:  Eat chicken (without skin), fish, veal, shellfish, ground [place] breast, and round or loin cuts of red meat.  Do not eat fried foods or fatty meats, such as hot dogs and salami.  Eat plenty of fruits, such as apples.  Eat plenty of vegetables, such  as broccoli, potatoes, and carrots.  Eat beans, peas, and lentils.  Eat grains such as barley, rice, couscous, and bulgur wheat.  Eat pasta without cream sauces.  Use skim or nonfat milk, and eat low-fat or nonfat yogurt and cheeses.  Do not eat or drink whole milk, cream, ice cream, egg yolks, or hard cheeses.  Do not eat stick margarine or tub margarines that contain trans fats (also called partially hydrogenated oils).  Do not eat saturated tropical oils, such as coconut oil and palm oil.  Do  not eat cakes, cookies, crackers, or other baked goods that contain trans fats.  General instructions  Exercise as directed by your health care provider. Increase your activity level with activities such as gardening, walking, and taking the stairs.  Take over-the-counter and prescription medicines only as told by your health care provider.  Do not use any products that contain nicotine or tobacco, such as cigarettes and e-cigarettes. If you need help quitting, ask your health care provider.  Keep all follow-up visits as told by your health care provider. This is important. Contact a health care provider if:  You are struggling to maintain a healthy diet or weight.  You need help to start on an exercise program.  You need help to stop smoking. Get help right away if:  You have chest pain.  You have trouble breathing. This information is not intended to replace advice given to you by your health care provider. Make sure you discuss any questions you have with your health care provider. Document Released: 08/07/2005 Document Revised: 08/10/2017 Document Reviewed: 02/05/2016 Elsevier Patient Education  2020 [employer].

## 2019-02-27 NOTE — Progress Notes (Signed)
Virtual Visit via Video Note  I connected with [name] [name]   on 02/27/19 at  3:45 PM EDT by a video enabled telemedicine application and verified that I am speaking with the correct person using two identifiers.  Location patient: work [employer] Persons participating in the virtual visit: patient, provider  I discussed the limitations of evaluation and management by telemedicine and the availability of in person appointments. The patient expressed understanding and agreed to proceed.   HPI: 1. Iron def anemia, B12, vitamin D she feels fatigue and craving sugar she has never had GI work up. She reports fusion plus iron pills made her constipated and she wants labs retested  2. Snoring a lot and stopping breathing at night c/w sleep apnea never had sleep study  3. GERD is worse on prilosec 20 mg qd worse at night 4. H/o Afib f/u cardiology Dr. Rockey Situ no current issues  ROS: See pertinent positives and negatives per HPI.  General: weight gain of 12+ lbs since 08/2018  HEENT: denies sore throat  CV: denies chest pain  Lungs: no sob  GI: worsening GERD  GU: no issues  Skin no issues MSK: no issues  Psych: +bipolar/+insomnia  Neuro: no h/a  Past Medical History:  Diagnosis Date  . A-fib ([place])   . Anemia   . Anxiety   . Arthritis   . B12 deficiency   . Chicken pox   . Depression   . Fibroid   . GERD (gastroesophageal reflux disease)   . History of hiatal hernia   . Hypertension   . Migraines     Past Surgical History:  Procedure Laterality Date  . APPENDECTOMY  5/11  . C-spine surgery    . CESAREAN SECTION  10/98,9/12  . HYSTERECTOMY ABDOMINAL WITH SALPINGO-OOPHORECTOMY Bilateral 08/05/2018   Procedure: HYSTERECTOMY ABDOMINAL WITH BILATERAL SALPINGO-OOPHORECTOMY;  Surgeon: Brayton Mars, MD;  Location: [facility] ORS;  Service: Gynecology;  Laterality: Bilateral;  . TONSILLECTOMY  03/2008  . TUBAL LIGATION      Family History  Problem Relation Age of  Onset  . Diabetes Mother   . Alcohol abuse Father   . Arthritis Father   . Heart disease Father   . Arthritis Sister   . Alcohol abuse Maternal Aunt   . Alcohol abuse Maternal Uncle   . Alcohol abuse Paternal Grandfather   . Lung cancer Paternal Grandfather        smoker   . Arthritis Maternal Grandmother   . Schizophrenia Maternal Grandmother   . Leukemia Maternal Grandfather   . Breast cancer Neg Hx     SOCIAL HX: 2 kids works [employer]    Current Outpatient Medications:  .  Cholecalciferol (VITAMIN D3) 125 MCG (5000 UT) CAPS, Take 1 capsule (5,000 Units total) by mouth daily., Disp: 30 capsule, Rfl: 11 .  clonazePAM (KLONOPIN) 0.5 MG tablet, Take 1 tablet (0.5 mg total) by mouth daily as needed for anxiety., Disp: 30 tablet, Rfl: 5 .  cyanocobalamin (,VITAMIN B-12,) 1000 MCG/ML injection, Inject 1 mL (1,000 mcg total) into the muscle every 30 (thirty) days., Disp: 10 mL, Rfl: 1 .  estradiol (CLIMARA - DOSED IN MG/24 HR) 0.05 mg/24hr patch, Place 1 patch (0.05 mg total) onto the skin once a week., Disp: 4 patch, Rfl: 12 .  flecainide (TAMBOCOR) 50 MG tablet, Take 50 mg by mouth 2 (two) times daily as needed., Disp: , Rfl:  .  fluticasone (FLONASE) 50 MCG/ACT nasal spray, Place 1 spray into both nostrils  daily as needed for congestion., Disp: , Rfl: 0 .  ibuprofen (ADVIL,MOTRIN) 600 MG tablet, TAKE 1 TABLET BY MOUTH FOUR TIMES DAILY, Disp: 50 tablet, Rfl: 0 .  Iron-FA-B Cmp-C-Biot-Probiotic (FUSION PLUS) CAPS, Take 1 capsule by mouth at bedtime., Disp: 60 capsule, Rfl: 1 .  lamoTRIgine (LAMICTAL) 200 MG tablet, TAKE 1 TABLET BY MOUTH  EVERY NIGHT AT BEDTIME (Patient taking differently: Take 300 mg by mouth daily. ), Disp: 90 tablet, Rfl: 1 .  metoprolol succinate (TOPROL XL) 25 MG 24 hr tablet, Take 1 tablet (25 mg total) by mouth daily., Disp: 90 tablet, Rfl: 3 .  Multiple Vitamin (MULTIVITAMIN WITH MINERALS) TABS tablet, Take 1 tablet by mouth at bedtime., Disp: , Rfl:  .   Oxcarbazepine (TRILEPTAL) 300 MG tablet, TAKE 3 TABLETS BY MOUTH  EVERY NIGHT AT BEDTIME, Disp: 270 tablet, Rfl: 1 .  sertraline (ZOLOFT) 50 MG tablet, TAKE 1 TABLET BY MOUTH  DAILY, Disp: 90 tablet, Rfl: 0 .  pantoprazole (PROTONIX) 40 MG tablet, Take 1 tablet (40 mg total) by mouth daily. 30 min before food, Disp: 90 tablet, Rfl: 3  EXAM:  VITALS per patient if applicable:  GENERAL: alert, oriented, appears well and in no acute distress  HEENT: atraumatic, conjunttiva clear, no obvious abnormalities on inspection of external nose and ears  NECK: normal movements of the head and neck  LUNGS: on inspection no signs of respiratory distress, breathing rate appears normal, no obvious gross SOB, gasping or wheezing  CV: no obvious cyanosis  MS: moves all visible extremities without noticeable abnormality  PSYCH/NEURO: pleasant and cooperative, no obvious depression or anxiety, speech and thought processing grossly intact  ASSESSMENT AND PLAN:  Discussed the following assessment and plan:  Iron deficiency anemia, unspecified iron deficiency anemia type - Plan: Iron, TIBC and Ferritin Panel, Ambulatory referral to Gastroenterology for EGD/colonoscopy  Consider h/o for iron infusions   Hypertriglyceridemia - Plan: Lipid panel  Menopause - Plan: FSH, Estradiol, f/u ob/gyn  B12 deficiency - Plan: Vitamin B12  Vitamin D deficiency - Plan: Vitamin D (25 hydroxy)  Gastroesophageal reflux disease, esophagitis presence not specified - Plan: pantoprazole (PROTONIX) 40 MG tablet d/c prilosec 20 mg qd, Ambulatory referral to Gastroenterology consider EGD  HM Flu utd  Tdap had 06/18/17  Consider shingrix in future   sch fastnig labs [facility] check hep B and MMR   Mammogram referred pt to call [place] to schedule apt EGD colonoscopy referred [facility]  Pap 05/21/18 s/p hystrectomy 07/2018   Dr Rockey Situ cards  Psych Dr. Clovis Pu in [place] Dr. Everitt Amber appt sch 03/12/19   Consider  sleep study in the future with snoring   I discussed the assessment and treatment plan with the patient. The patient was provided an opportunity to ask questions and all were answered. The patient agreed with the plan and demonstrated an understanding of the instructions.   The patient was advised to call back or seek an in-person evaluation if the symptoms worsen or if the condition fails to improve as anticipated.  Time spent 25 minutes  Delorise Jackson, MD

## 2019-02-28 ENCOUNTER — Ambulatory Visit: Payer: Managed Care, Other (non HMO) | Admitting: Psychiatry

## 2019-03-03 ENCOUNTER — Encounter: Payer: Self-pay | Admitting: Internal Medicine

## 2019-03-05 ENCOUNTER — Ambulatory Visit: Payer: Managed Care, Other (non HMO) | Admitting: Gastroenterology

## 2019-03-06 ENCOUNTER — Other Ambulatory Visit: Payer: Self-pay

## 2019-03-06 ENCOUNTER — Ambulatory Visit: Payer: Managed Care, Other (non HMO) | Admitting: Gastroenterology

## 2019-03-06 ENCOUNTER — Encounter: Payer: Self-pay | Admitting: Gastroenterology

## 2019-03-06 VITALS — BP 133/82 | HR 71 | Temp 97.7°F | Ht 65.0 in | Wt 207.6 lb

## 2019-03-06 DIAGNOSIS — N200 Calculus of kidney: Secondary | ICD-10-CM | POA: Insufficient documentation

## 2019-03-06 DIAGNOSIS — K219 Gastro-esophageal reflux disease without esophagitis: Secondary | ICD-10-CM

## 2019-03-06 DIAGNOSIS — Z8719 Personal history of other diseases of the digestive system: Secondary | ICD-10-CM | POA: Insufficient documentation

## 2019-03-06 DIAGNOSIS — D509 Iron deficiency anemia, unspecified: Secondary | ICD-10-CM

## 2019-03-06 DIAGNOSIS — Z1211 Encounter for screening for malignant neoplasm of colon: Secondary | ICD-10-CM

## 2019-03-06 DIAGNOSIS — F419 Anxiety disorder, unspecified: Secondary | ICD-10-CM | POA: Insufficient documentation

## 2019-03-06 NOTE — Progress Notes (Signed)
[name] [name] [address]  [place]  [place], [place] [address]  Main: [phone]  Fax: [phone]   Gastroenterology Consultation  Referring Provider:     McLean-Scocuzza, Olivia Mackie * Primary Care Physician:  McLean-Scocuzza, Nino Glow, MD Reason for Consultation:     Iron deficiency anemia        HPI:    Chief Complaint  Patient presents with  . New Patient (Initial Visit)    IDA, GERD    [name] [name] is a 53 y.o. y/o female referred for consultation & management  by Dr. Terese Door, Nino Glow, MD.  Patient reports previous history of menorrhagia, and underwent hysterectomy and bilateral salpingo-nephrectomy in December 2019.  Ferritin prior to that surgery was 12.  Repeat labs have been ordered by primary care provider and patient is planning on having them done this week.  No episodes of bleeding from any source.  Patient states she does not tolerate oral iron well pates her and she talked to her primary care doctor about IV iron infusions and she wanted to repeat iron levels prior to referral for hematology  No family history of colon cancer  Patient previously evaluated in November 2015 by Dr. Candace Cruise of [facility] and as per their note:  Had both colonoscopy and EGD in 2013 for generalized abdominal pain and dysphagia. Colon was normal. 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 Gastric bx's were negative.   Also reports daily heartburn for which she was started on Protonix by PCP last week.  This is helping.  Also reports intermittent bright red blood per rectum and feels that she has hemorrhoids.  Past Medical History:  Diagnosis Date  . A-fib ([place])   . Anemia   . Anxiety   . Arthritis   . B12 deficiency   . Chicken pox   . Depression   . Fibroid   . GERD (gastroesophageal reflux disease)   . History of hiatal hernia   . Hypertension   . Migraines     Past Surgical History:  Procedure Laterality Date  . APPENDECTOMY  5/11  .  C-spine surgery    . CESAREAN SECTION  10/98,9/12  . HYSTERECTOMY ABDOMINAL WITH SALPINGO-OOPHORECTOMY Bilateral 08/05/2018   Procedure: HYSTERECTOMY ABDOMINAL WITH BILATERAL SALPINGO-OOPHORECTOMY;  Surgeon: Brayton Mars, MD;  Location: [facility] ORS;  Service: Gynecology;  Laterality: Bilateral;  . TONSILLECTOMY  03/2008  . TUBAL LIGATION      Prior to Admission medications   Medication Sig Start Date End Date Taking? Authorizing Provider  sertraline (ZOLOFT) 50 MG tablet TAKE 1 TABLET BY MOUTH  DAILY 02/19/19  Yes Cottle, Billey Co., MD  benzonatate (TESSALON) 200 MG capsule TK 1 C PO TID PRN 09/27/18   [provider]  Cholecalciferol (VITAMIN D3) 125 MCG (5000 UT) CAPS Take 1 capsule (5,000 Units total) by mouth daily. 11/28/18   Rubie Maid, MD  clonazePAM (KLONOPIN) 0.5 MG tablet Take 1 tablet (0.5 mg total) by mouth daily as needed for anxiety. 08/23/18   Cottle, Billey Co., MD  cyanocobalamin (,VITAMIN B-12,) 1000 MCG/ML injection Inject 1 mL (1,000 mcg total) into the muscle every 30 (thirty) days. 08/23/18   Cottle, Billey Co., MD  estradiol (CLIMARA - DOSED IN MG/24 HR) 0.05 mg/24hr patch Place 1 patch (0.05 mg total) onto the skin once a week. 09/23/18   Rubie Maid, MD  flecainide (TAMBOCOR) 50 MG tablet Take 50 mg by mouth 2 (two) times daily as  needed.    [provider]  fluticasone (FLONASE) 50 MCG/ACT nasal spray Place 1 spray into both nostrils daily as needed for congestion. 07/22/18   [provider]  ibuprofen (ADVIL,MOTRIN) 600 MG tablet TAKE 1 TABLET BY MOUTH FOUR TIMES DAILY 08/26/18   Defrancesco, Alanda Slim, MD  Iron-FA-B Cmp-C-Biot-Probiotic (FUSION PLUS) CAPS Take 1 capsule by mouth at bedtime. Patient not taking: Reported on 03/06/2019 11/28/18   Rubie Maid, MD  lamoTRIgine (LAMICTAL) 200 MG tablet TAKE 1 TABLET BY MOUTH  EVERY NIGHT AT BEDTIME Patient taking differently: Take 300 mg by mouth daily.  02/14/19   Cottle, Billey Co., MD   metoprolol succinate (TOPROL XL) 25 MG 24 hr tablet Take 1 tablet (25 mg total) by mouth daily. 12/16/18   Minna Merritts, MD  Multiple Vitamin (MULTIVITAMIN WITH MINERALS) TABS tablet Take 1 tablet by mouth at bedtime.    [provider]  Oxcarbazepine (TRILEPTAL) 300 MG tablet TAKE 3 TABLETS BY MOUTH  EVERY NIGHT AT BEDTIME 02/14/19   Cottle, Billey Co., MD  pantoprazole (PROTONIX) 40 MG tablet Take 1 tablet (40 mg total) by mouth daily. 30 min before food Patient not taking: Reported on 03/06/2019 02/27/19   McLean-Scocuzza, Nino Glow, MD  tiZANidine (ZANAFLEX) 4 MG capsule TK 1 C PO TID PRN 02/13/19   [provider]    Family History  Problem Relation Age of Onset  . Diabetes Mother   . Alcohol abuse Father   . Arthritis Father   . Heart disease Father   . Arthritis Sister   . Alcohol abuse Maternal Aunt   . Alcohol abuse Maternal Uncle   . Alcohol abuse Paternal Grandfather   . Lung cancer Paternal Grandfather        smoker   . Arthritis Maternal Grandmother   . Schizophrenia Maternal Grandmother   . Leukemia Maternal Grandfather   . Breast cancer Neg Hx      Social History   Tobacco Use  . Smoking status: Former Smoker    Packs/day: 1.00    Years: 10.00    Pack years: 10.00    Types: Cigarettes    Quit date: 08/22/1991    Years since quitting: 27.5  . Smokeless tobacco: Never Used  Substance Use Topics  . Alcohol use: Yes    Alcohol/week: 0.0 standard drinks    Comment: twice a year   . Drug use: Not Currently    Types: Marijuana    Comment: not for a couple years    Allergies as of 03/06/2019 - Review Complete 03/06/2019  Allergen Reaction Noted  . Phenylmercuric nitrate Rash 07/02/2014  . Augmentin [amoxicillin-pot clavulanate] Nausea Only 02/10/2013  . Codeine Nausea Only 02/10/2013  . Diclofenac Nausea Only   . Latex Rash 02/10/2013  . Neomycin Rash 06/26/2015  . Percocet [oxycodone-acetaminophen] Rash 02/10/2013    Review of Systems:     All systems reviewed and negative except where noted in HPI.   Physical Exam:  BP 133/82 (BP Location: Right Arm, Patient Position: Sitting, Cuff Size: Large)   Pulse 71   Temp 97.7 F (36.5 C)   Ht 5\' 5"  (1.651 m)   Wt 207 lb 9.6 oz (94.2 kg)   LMP  (LMP Unknown)   BMI 34.55 kg/m  No LMP recorded (lmp unknown). Patient has had a hysterectomy. Psych:  Alert and cooperative. Normal mood and affect. General:   Alert,  Well-developed, well-nourished, pleasant and cooperative in NAD Head:  Normocephalic and  atraumatic. Eyes:  Sclera clear, no icterus.   Conjunctiva pink. Ears:  Normal auditory acuity. Nose:  No deformity, discharge, or lesions. Mouth:  No deformity or lesions,oropharynx pink & moist. Neck:  Supple; no masses or thyromegaly. Abdomen:  Normal bowel sounds.  No bruits.  Soft, non-tender and non-distended without masses, hepatosplenomegaly or hernias noted.  No guarding or rebound tenderness.    Msk:  Symmetrical without gross deformities. Good, equal movement & strength bilaterally. Pulses:  Normal pulses noted. Extremities:  No clubbing or edema.  No cyanosis. Neurologic:  Alert and oriented x3;  grossly normal neurologically. Skin:  Intact without significant lesions or rashes. No jaundice. Lymph Nodes:  No significant cervical adenopathy. Psych:  Alert and cooperative. Normal mood and affect.   Labs: CBC    Component Value Date/Time   WBC 4.9 07/30/2018 1033   RBC 4.82 07/30/2018 1033   HGB 10.1 (L) 08/06/2018 0527   HGB 10.1 (L) 05/31/2018 1530   HCT 36.3 07/30/2018 1033   HCT 32.7 (L) 05/31/2018 1530   PLT 449 (H) 07/30/2018 1033   PLT 489 (H) 05/31/2018 1530   MCV 75.3 (L) 07/30/2018 1033   MCV 73 (L) 05/31/2018 1530   MCV 77 (L) 11/25/2014 2152   MCH 22.4 (L) 07/30/2018 1033   MCHC 29.8 (L) 07/30/2018 1033   RDW 17.5 (H) 07/30/2018 1033   RDW 17.0 (H) 05/31/2018 1530   RDW 22.4 (H) 11/25/2014 2152   LYMPHSABS 2.2 07/30/2018 1033   LYMPHSABS 2.4  03/27/2013 0500   MONOABS 0.4 07/30/2018 1033   MONOABS 0.4 03/27/2013 0500   EOSABS 0.3 07/30/2018 1033   EOSABS 0.2 03/27/2013 0500   BASOSABS 0.0 07/30/2018 1033   BASOSABS 0.0 03/27/2013 0500   CMP     Component Value Date/Time   NA 139 05/31/2018 1530   NA 138 11/25/2014 2152   K 4.3 05/31/2018 1530   K 3.7 11/25/2014 2152   CL 104 05/31/2018 1530   CL 105 11/25/2014 2152   CO2 23 05/31/2018 1530   CO2 28 11/25/2014 2152   GLUCOSE 128 (H) 05/31/2018 1530   GLUCOSE 108 (H) 12/26/2016 0812   GLUCOSE 98 11/25/2014 2152   BUN 14 05/31/2018 1530   BUN 10 11/25/2014 2152   CREATININE 0.84 05/31/2018 1530   CREATININE 0.81 11/25/2014 2152   CALCIUM 10.2 05/31/2018 1530   CALCIUM 10.0 11/25/2014 2152   PROT 6.9 05/31/2018 1530   PROT 7.2 11/25/2014 2152   ALBUMIN 4.2 05/31/2018 1530   ALBUMIN 4.1 11/25/2014 2152   AST 11 05/31/2018 1530   AST 15 11/25/2014 2152   ALT 12 05/31/2018 1530   ALT 13 (L) 11/25/2014 2152   ALKPHOS 85 05/31/2018 1530   ALKPHOS 61 11/25/2014 2152   BILITOT <0.2 05/31/2018 1530   BILITOT 0.4 11/25/2014 2152   GFRNONAA 81 05/31/2018 1530   GFRNONAA >60 11/25/2014 2152   GFRAA 94 05/31/2018 1530   GFRAA >60 11/25/2014 2152    Imaging Studies: No results found.  Assessment and Plan:   JANETTA VANDOREN is a 53 y.o. y/o female has been referred for iron deficiency anemia and GERD  Patient will need EGD and colonoscopy both for iron deficiency anemia, and also patient is due for screening colonoscopy  Patient educated extensively on acid reflux lifestyle modification, including buying a bed wedge, not eating 3 hrs before bedtime, diet modifications, and handout given for the same.   (Risks of PPI use were discussed with patient including bone  loss, C. Diff diarrhea, pneumonia, infections, CKD, electrolyte abnormalities.  Pt. Verbalizes understanding and chooses to continue the medication.)  Can discontinue or decrease Protonix on future visits  depending on symptoms and the EGD findings  Intermittent bright red per rectum possibly due to hemorrhoids.  Will evaluate during colonoscopy  Patient was asked to get the blood work done with PCP ordered as well  I have discussed alternative options, risks & benefits,  which include, but are not limited to, bleeding, infection, perforation,respiratory complication & drug reaction.  The patient agrees with this plan & written consent will be obtained.      Dr [name] [name]  Speech recognition software was used to dictate the above note.

## 2019-03-09 LAB — CBC WITH DIFFERENTIAL/PLATELET
Basophils Absolute: 0 10*3/uL (ref 0.0–0.2)
Basos: 0 %
EOS (ABSOLUTE): 0.3 10*3/uL (ref 0.0–0.4)
Eos: 4 %
Hematocrit: 40.9 % (ref 34.0–46.6)
Hemoglobin: 13.9 g/dL (ref 11.1–15.9)
Immature Grans (Abs): 0 10*3/uL (ref 0.0–0.1)
Immature Granulocytes: 0 %
Lymphocytes Absolute: 2.2 10*3/uL (ref 0.7–3.1)
Lymphs: 34 %
MCH: 28.1 pg (ref 26.6–33.0)
MCHC: 34 g/dL (ref 31.5–35.7)
MCV: 83 fL (ref 79–97)
Monocytes Absolute: 0.5 10*3/uL (ref 0.1–0.9)
Monocytes: 7 %
Neutrophils Absolute: 3.5 10*3/uL (ref 1.4–7.0)
Neutrophils: 55 %
Platelets: 338 10*3/uL (ref 150–450)
RBC: 4.94 x10E6/uL (ref 3.77–5.28)
RDW: 14.4 % (ref 11.7–15.4)
WBC: 6.5 10*3/uL (ref 3.4–10.8)

## 2019-03-09 LAB — URINALYSIS, ROUTINE W REFLEX MICROSCOPIC
Bilirubin, UA: NEGATIVE
Glucose, UA: NEGATIVE
Ketones, UA: NEGATIVE
Leukocytes,UA: NEGATIVE
Nitrite, UA: NEGATIVE
Protein,UA: NEGATIVE
RBC, UA: NEGATIVE
Specific Gravity, UA: >=1.03 — AB (ref 1.005–1.030)
Urobilinogen, Ur: 0.2 mg/dL (ref 0.2–1.0)
pH, UA: 6 (ref 5.0–7.5)

## 2019-03-09 LAB — IRON,TIBC AND FERRITIN PANEL
Ferritin: 23 ng/mL (ref 15–150)
Iron Saturation: 17 % (ref 15–55)
Iron: 65 ug/dL (ref 27–159)
Total Iron Binding Capacity: 393 ug/dL (ref 250–450)
UIBC: 328 ug/dL (ref 131–425)

## 2019-03-09 LAB — COMPREHENSIVE METABOLIC PANEL
ALT: 14 IU/L (ref 0–32)
AST: 19 IU/L (ref 0–40)
Albumin/Globulin Ratio: 1.8 (ref 1.2–2.2)
Albumin: 4.8 g/dL (ref 3.8–4.9)
Alkaline Phosphatase: 118 IU/L — ABNORMAL HIGH (ref 39–117)
BUN/Creatinine Ratio: 18 (ref 9–23)
BUN: 15 mg/dL (ref 6–24)
Bilirubin Total: 0.2 mg/dL (ref 0.0–1.2)
CO2: 20 mmol/L (ref 20–29)
Calcium: 10.9 mg/dL — ABNORMAL HIGH (ref 8.7–10.2)
Chloride: 102 mmol/L (ref 96–106)
Creatinine, Ser: 0.84 mg/dL (ref 0.57–1.00)
GFR calc Af Amer: 93 mL/min/{1.73_m2} (ref >59)
GFR calc non Af Amer: 81 mL/min/{1.73_m2} (ref >59)
Globulin, Total: 2.7 g/dL (ref 1.5–4.5)
Glucose: 95 mg/dL (ref 65–99)
Potassium: 4.6 mmol/L (ref 3.5–5.2)
Sodium: 140 mmol/L (ref 134–144)
Total Protein: 7.5 g/dL (ref 6.0–8.5)

## 2019-03-09 LAB — MEASLES/MUMPS/RUBELLA IMMUNITY
MUMPS ABS, IGG: <9 AU/mL — ABNORMAL LOW (ref >10.9)
RUBEOLA AB, IGG: >300 AU/mL (ref >16.4)
Rubella Antibodies, IGG: 4.27 index (ref >0.99)

## 2019-03-09 LAB — LIPID PANEL
Chol/HDL Ratio: 3.2 ratio (ref 0.0–4.4)
Cholesterol, Total: 217 mg/dL — ABNORMAL HIGH (ref 100–199)
HDL: 68 mg/dL (ref >39)
LDL Calculated: 124 mg/dL — ABNORMAL HIGH (ref 0–99)
Triglycerides: 126 mg/dL (ref 0–149)
VLDL Cholesterol Cal: 25 mg/dL (ref 5–40)

## 2019-03-09 LAB — VITAMIN B12: Vitamin B-12: 235 pg/mL (ref 232–1245)

## 2019-03-09 LAB — FOLLICLE STIMULATING HORMONE: FSH: 83.2 m[IU]/mL

## 2019-03-09 LAB — VITAMIN D 25 HYDROXY (VIT D DEFICIENCY, FRACTURES): Vit D, 25-Hydroxy: 26.1 ng/mL — ABNORMAL LOW (ref 30.0–100.0)

## 2019-03-09 LAB — HEPATITIS B SURFACE ANTIBODY, QUANTITATIVE: Hepatitis B Surf Ab Quant: 4.2 m[IU]/mL — ABNORMAL LOW (ref >9.9)

## 2019-03-09 LAB — ESTRADIOL: Estradiol: 16.5 pg/mL

## 2019-03-10 ENCOUNTER — Other Ambulatory Visit: Payer: Self-pay | Admitting: Internal Medicine

## 2019-03-12 ENCOUNTER — Other Ambulatory Visit: Payer: Self-pay | Admitting: Internal Medicine

## 2019-03-12 ENCOUNTER — Encounter: Payer: Self-pay | Admitting: Internal Medicine

## 2019-03-12 DIAGNOSIS — E785 Hyperlipidemia, unspecified: Secondary | ICD-10-CM | POA: Insufficient documentation

## 2019-03-12 DIAGNOSIS — E559 Vitamin D deficiency, unspecified: Secondary | ICD-10-CM | POA: Insufficient documentation

## 2019-03-12 NOTE — Addendum Note (Signed)
Addended by: Orland Mustard on: 03/12/2019 09:27 AM   Modules accepted: Orders

## 2019-03-21 ENCOUNTER — Other Ambulatory Visit: Payer: Self-pay

## 2019-03-21 ENCOUNTER — Encounter: Payer: Self-pay | Admitting: Psychiatry

## 2019-03-21 ENCOUNTER — Other Ambulatory Visit: Payer: Managed Care, Other (non HMO) | Attending: Gastroenterology

## 2019-03-21 ENCOUNTER — Ambulatory Visit (INDEPENDENT_AMBULATORY_CARE_PROVIDER_SITE_OTHER): Payer: 59 | Admitting: Psychiatry

## 2019-03-21 DIAGNOSIS — F4001 Agoraphobia with panic disorder: Secondary | ICD-10-CM | POA: Diagnosis not present

## 2019-03-21 DIAGNOSIS — F4310 Post-traumatic stress disorder, unspecified: Secondary | ICD-10-CM

## 2019-03-21 DIAGNOSIS — F40240 Claustrophobia: Secondary | ICD-10-CM

## 2019-03-21 DIAGNOSIS — F411 Generalized anxiety disorder: Secondary | ICD-10-CM | POA: Diagnosis not present

## 2019-03-21 DIAGNOSIS — F319 Bipolar disorder, unspecified: Secondary | ICD-10-CM

## 2019-03-21 DIAGNOSIS — E538 Deficiency of other specified B group vitamins: Secondary | ICD-10-CM

## 2019-03-21 MED ORDER — SERTRALINE HCL 100 MG PO TABS: 100.0000 mg | ORAL_TABLET | Freq: Every day | ORAL | 1 refills | Status: DC

## 2019-03-21 NOTE — Progress Notes (Signed)
ESSENCE MERLE [id number] Feb 22, 1966 53 y.o.  Subjective:   Patient ID:  [name] [name] is a 53 y.o. (DOB February 14, 1966) female.  Chief Complaint:  Chief Complaint  Patient presents with  . Follow-up    Medication Management  . Depression    Medication Management  . Anxiety    Medication Management  . Other    PTSD    Depression        Associated symptoms include no decreased concentration and no suicidal ideas.  Past medical history includes anxiety.   Anxiety Patient reports no confusion, decreased concentration, nervous/anxious behavior or suicidal ideas.     [name] [name] presents to the office today for follow-up of several dxes.  Last visit was January 2020.  She had recently been diagnosed with low vitamin B12.  She was initiating treatment.  No meds were changed.  Ok overall.  Was furloughed a while.  Back at work.  More anxiety and panic attacks and thinks it's related to wearing masks and dealing with people.  Claustrophobic and has to wear a mask with others.  Feels SOB.  No longer wants to ride elevators.  Afraid of Covid.  Has to go to the hospital to work.  Increased daily HA.  Panic attacks from 0-2 weekly.    She reduced sertraline to 25 and then stopped and got more depressed and restarted  25 mg.  Gained 20# since October.  Hair coming out. 205# from 170.  Patient reports stable mood and denies depressed or irritable moods other than mild fluctuations.  No mania.   More anger and irritability.   Patient denies difficulty with sleep initiation or maintenance. Denies appetite disturbance.  Patient reports that energy and motivation have been good.  She still has complaints of concentration and forgetfulness as previously noted.  Patient denies any suicidal ideation.  Still forgetful.  Was told she had low b12, still working on getting it up.  Getting shots.  Just had hysterectomy for endometriosis and plans to start the patch.  Worried about hormones affecting  mood.  Hx bad PMS.  Prior psychiatric medication trials include sertraline, Depakote, Saphris, lithium, lamotrigine, risperidone, Trileptal Review of Systems:  Review of Systems  Constitutional: Positive for fever.  Neurological: Negative for tremors and weakness.  Psychiatric/Behavioral: Positive for depression. Negative for agitation, behavioral problems, confusion, decreased concentration, dysphoric mood, hallucinations, self-injury, sleep disturbance and suicidal ideas. The patient is not nervous/anxious and is not hyperactive.     Medications: I have reviewed the patient's current medications.  Current Outpatient Medications  Medication Sig Dispense Refill  . Cholecalciferol (VITAMIN D3) 125 MCG (5000 UT) CAPS Take 1 capsule (5,000 Units total) by mouth daily. 30 capsule 11  . clonazePAM (KLONOPIN) 0.5 MG tablet Take 1 tablet (0.5 mg total) by mouth daily as needed for anxiety. 30 tablet 5  . cyanocobalamin (,VITAMIN B-12,) 1000 MCG/ML injection Inject 1 mL (1,000 mcg total) into the muscle every 30 (thirty) days. 10 mL 1  . estradiol (CLIMARA - DOSED IN MG/24 HR) 0.05 mg/24hr patch Place 1 patch (0.05 mg total) onto the skin once a week. 4 patch 12  . flecainide (TAMBOCOR) 50 MG tablet Take 50 mg by mouth 2 (two) times daily as needed.    . fluticasone (FLONASE) 50 MCG/ACT nasal spray Place 1 spray into both nostrils daily as needed for congestion.  0  . ibuprofen (ADVIL,MOTRIN) 600 MG tablet TAKE 1 TABLET BY MOUTH FOUR TIMES DAILY 50 tablet 0  .  lamoTRIgine (LAMICTAL) 200 MG tablet TAKE 1 TABLET BY MOUTH  EVERY NIGHT AT BEDTIME (Patient taking differently: Take 200 mg by mouth daily. ) 90 tablet 1  . metoprolol succinate (TOPROL XL) 25 MG 24 hr tablet Take 1 tablet (25 mg total) by mouth daily. 90 tablet 3  . Multiple Vitamin (MULTIVITAMIN WITH MINERALS) TABS tablet Take 1 tablet by mouth at bedtime.    . Oxcarbazepine (TRILEPTAL) 300 MG tablet TAKE 3 TABLETS BY MOUTH  EVERY NIGHT AT  BEDTIME 270 tablet 1  . pantoprazole (PROTONIX) 40 MG tablet Take 1 tablet (40 mg total) by mouth daily. 30 min before food 90 tablet 3  . sertraline (ZOLOFT) 50 MG tablet TAKE 1 TABLET BY MOUTH  DAILY 90 tablet 0  . tiZANidine (ZANAFLEX) 4 MG capsule TK 1 C PO TID PRN    . Iron-FA-B Cmp-C-Biot-Probiotic (FUSION PLUS) CAPS Take 1 capsule by mouth at bedtime. (Patient not taking: Reported on 03/21/2019) 60 capsule 1   No current facility-administered medications for this visit.     Medication Side Effects: None  Allergies:  Allergies  Allergen Reactions  . Phenylmercuric Nitrate Rash  . Augmentin [Amoxicillin-Pot Clavulanate] Nausea Only    Has patient had a PCN reaction causing immediate rash, facial/tongue/throat swelling, SOB or lightheadedness with hypotension: No Has patient had a PCN reaction causing severe rash involving mucus membranes or skin necrosis: No Has patient had a PCN reaction that required hospitalization: No Has patient had a PCN reaction occurring within the last 10 years: Yes If all of the above answers are "NO", then may proceed with Cephalosporin use.   . Codeine Nausea Only  . Diclofenac Nausea Only  . Latex Rash  . Neomycin Rash  . Percocet [Oxycodone-Acetaminophen] Rash    Hives     Past Medical History:  Diagnosis Date  . A-fib ([place])   . Anemia   . Anxiety   . Arthritis   . B12 deficiency   . Chicken pox   . Depression   . Fibroid   . GERD (gastroesophageal reflux disease)   . History of hiatal hernia   . Hypertension   . Migraines     Family History  Problem Relation Age of Onset  . Diabetes Mother   . Alcohol abuse Father   . Arthritis Father   . Heart disease Father   . Arthritis Sister   . Alcohol abuse Maternal Aunt   . Alcohol abuse Maternal Uncle   . Alcohol abuse Paternal Grandfather   . Lung cancer Paternal Grandfather        smoker   . Arthritis Maternal Grandmother   . Schizophrenia Maternal Grandmother   . Leukemia  Maternal Grandfather   . Breast cancer Neg Hx     Social History   Socioeconomic History  . Marital status: Married    Spouse name: Not on file  . Number of children: Not on file  . Years of education: Not on file  . Highest education level: Not on file  Occupational History  . Not on file  Social Needs  . Financial resource strain: Not on file  . Food insecurity    Worry: Not on file    Inability: Not on file  . Transportation needs    Medical: Not on file    Non-medical: Not on file  Tobacco Use  . Smoking status: Former Smoker    Packs/day: 1.00    Years: 10.00    Pack years: 10.00  Types: Cigarettes    Quit date: 08/22/1991    Years since quitting: 27.5  . Smokeless tobacco: Never Used  Substance and Sexual Activity  . Alcohol use: Yes    Alcohol/week: 0.0 standard drinks    Comment: twice a year   . Drug use: Not Currently    Types: Marijuana    Comment: not for a couple years  . Sexual activity: Yes    Partners: Male    Birth control/protection: Surgical    Comment: Husband  Lifestyle  . Physical activity    Days per week: Not on file    Minutes per session: Not on file  . Stress: Not on file  Relationships  . Social Herbalist on phone: Not on file    Gets together: Not on file    Attends religious service: Not on file    Active member of club or organization: Not on file    Attends meetings of clubs or organizations: Not on file    Relationship status: Not on file  . Intimate partner violence    Fear of current or ex partner: Not on file    Emotionally abused: Not on file    Physically abused: Not on file    Forced sexual activity: Not on file  Other Topics Concern  . Not on file  Social History Narrative   [employer]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    Lives with husband and 2 children    Son- 39 yo and daughter 80 yo    Pets: 2 dogs    Caffeine- 2 sodas 20 oz, coffee 1 cup occasionally    Enjoys being outside with family       DPR  mom Horton Chin [phone]     Past Medical History, Surgical history, Social history, and Family history were reviewed and updated as appropriate.   Please see review of systems for further details on the patient's review from today.   Objective:   Physical Exam:  LMP  (LMP Unknown)   Physical Exam Constitutional:      General: She is not in acute distress.    Appearance: She is well-developed. She is obese.  Musculoskeletal:        General: No deformity.  Neurological:     Mental Status: She is alert and oriented to person, place, and time.     Motor: No tremor.     Coordination: Coordination normal.     Gait: Gait normal.  Psychiatric:        Attention and Perception: Attention normal.        Mood and Affect: Mood is anxious. Mood is not depressed. Affect is not labile, blunt, angry or inappropriate.        Speech: Speech normal.        Behavior: Behavior normal.        Thought Content: Thought content normal. Thought content does not include homicidal or suicidal ideation. Thought content does not include homicidal or suicidal plan.        Cognition and Memory: Cognition normal.        Judgment: Judgment normal.     Comments: Insight intact. No auditory or visual hallucinations. No delusions.  Occ irritability.     Lab Review:     Component Value Date/Time   NA 140 03/07/2019 1227   NA 138 11/25/2014 2152   K 4.6 03/07/2019 1227   K 3.7 11/25/2014 2152   CL 102 03/07/2019  1227   CL 105 11/25/2014 2152   CO2 20 03/07/2019 1227   CO2 28 11/25/2014 2152   GLUCOSE 95 03/07/2019 1227   GLUCOSE 108 (H) 12/26/2016 0812   GLUCOSE 98 11/25/2014 2152   BUN 15 03/07/2019 1227   BUN 10 11/25/2014 2152   CREATININE 0.84 03/07/2019 1227   CREATININE 0.81 11/25/2014 2152   CALCIUM 10.9 (H) 03/07/2019 1227   CALCIUM 10.0 11/25/2014 2152   PROT 7.5 03/07/2019 1227   PROT 7.2 11/25/2014 2152   ALBUMIN 4.8 03/07/2019 1227   ALBUMIN 4.1 11/25/2014 2152   AST 19  03/07/2019 1227   AST 15 11/25/2014 2152   ALT 14 03/07/2019 1227   ALT 13 (L) 11/25/2014 2152   ALKPHOS 118 (H) 03/07/2019 1227   ALKPHOS 61 11/25/2014 2152   BILITOT 0.2 03/07/2019 1227   BILITOT 0.4 11/25/2014 2152   GFRNONAA 81 03/07/2019 1227   GFRNONAA >60 11/25/2014 2152   GFRAA 93 03/07/2019 1227   GFRAA >60 11/25/2014 2152       Component Value Date/Time   WBC 6.5 03/07/2019 1227   WBC 4.9 07/30/2018 1033   RBC 4.94 03/07/2019 1227   RBC 4.82 07/30/2018 1033   HGB 13.9 03/07/2019 1227   HCT 40.9 03/07/2019 1227   PLT 338 03/07/2019 1227   MCV 83 03/07/2019 1227   MCV 77 (L) 11/25/2014 2152   MCH 28.1 03/07/2019 1227   MCH 22.4 (L) 07/30/2018 1033   MCHC 34.0 03/07/2019 1227   MCHC 29.8 (L) 07/30/2018 1033   RDW 14.4 03/07/2019 1227   RDW 22.4 (H) 11/25/2014 2152   LYMPHSABS 2.2 03/07/2019 1227   LYMPHSABS 2.4 03/27/2013 0500   MONOABS 0.4 07/30/2018 1033   MONOABS 0.4 03/27/2013 0500   EOSABS 0.3 03/07/2019 1227   EOSABS 0.2 03/27/2013 0500   BASOSABS 0.0 03/07/2019 1227   BASOSABS 0.0 03/27/2013 0500    No results found for: POCLITH, LITHIUM   No results found for: PHENYTOIN, PHENOBARB, VALPROATE, CBMZ   .res Assessment: Plan:    [name] [name] was seen today for follow-up, depression, anxiety and other.  Diagnoses and all orders for this visit:  Panic disorder with agoraphobia  Claustrophobia  Generalized anxiety disorder  PTSD (post-traumatic stress disorder)  Bipolar I disorder with seasonal pattern ([place])  B12 deficiency   [name] [name] has a history of bipolar disorder with heavy seasonal component .  Her mood is been fairly stable since she is was here last.  However,She's more anxious in part DT Covid and more claustrophobia and stress and panic.  Some of it likely related to sertraline taper.  She was stable on 100 mg sertraline but it was tapered to help minimize meds.  Increase sertraline back to 100 mg daily.  Disc risk mood cycling.  She  asks about taking a leave from work.  However I believe this problem is treatable by increasing the sertraline back up.  We will not initiate any leave from work at this point.  Continue other meds.  Disc mood issues and estrogen and progesterone and risks with and without.  Recommend start NAC .  This could help the forgetfulness.  We discussed how low B12 levels are often associated with cognitive problems among other neurologic symptoms.  She still needs clonazepam for the anxiety but has been able to reduce it to the current level.  She is also been able to reduce the sertraline from 100 mg to 50 mg without persistent difficulty.  She does not  feel ready to stop it until the spring.  We discussed the short-term risks associated with benzodiazepines including sedation and increased fall risk among others.  Discussed long-term side effect risk including dependence, potential withdrawal symptoms, and the potential eventual dose-related risk of dementia.  This appointment was 30 minutes  FU 6 mos  Lynder Parents, MD, DFAPA    Please see After Visit Summary for patient specific instructions.  Future Appointments  Date Time Provider [place]  03/21/2019  2:45 PM [facility] None  04/10/2019  2:30 PM Virgel Manifold, MD [facility] None  06/03/2019  3:30 PM McLean-Scocuzza, Nino Glow, MD [facility]    No orders of the defined types were placed in this encounter.     -------------------------------

## 2019-03-24 ENCOUNTER — Other Ambulatory Visit: Payer: Self-pay

## 2019-03-24 ENCOUNTER — Other Ambulatory Visit
Admission: RE | Admit: 2019-03-24 | Discharge: 2019-03-24 | Disposition: A | Discharge status: Home / Self Care | Payer: Managed Care, Other (non HMO) | Source: Ambulatory Visit | Attending: Gastroenterology | Admitting: Gastroenterology

## 2019-03-24 DIAGNOSIS — Z20828 Contact with and (suspected) exposure to other viral communicable diseases: Secondary | ICD-10-CM | POA: Insufficient documentation

## 2019-03-24 DIAGNOSIS — Z01812 Encounter for preprocedural laboratory examination: Secondary | ICD-10-CM | POA: Diagnosis not present

## 2019-03-24 LAB — SARS CORONAVIRUS 2 (TAT 6-24 HRS): SARS Coronavirus 2: NEGATIVE

## 2019-03-26 ENCOUNTER — Ambulatory Visit
Admission: RE | Admit: 2019-03-26 | Discharge: 2019-03-26 | Disposition: A | Discharge status: Home / Self Care | Payer: Managed Care, Other (non HMO) | Source: Ambulatory Visit | Attending: Gastroenterology | Admitting: Gastroenterology

## 2019-03-26 ENCOUNTER — Encounter
Admission: RE | Disposition: A | Discharge status: Home / Self Care | Payer: Self-pay | Source: Ambulatory Visit | Attending: Gastroenterology

## 2019-03-26 ENCOUNTER — Ambulatory Visit: Payer: Managed Care, Other (non HMO) | Admitting: Anesthesiology

## 2019-03-26 ENCOUNTER — Other Ambulatory Visit: Payer: Self-pay

## 2019-03-26 ENCOUNTER — Encounter: Payer: Self-pay | Admitting: *Deleted

## 2019-03-26 DIAGNOSIS — K635 Polyp of colon: Secondary | ICD-10-CM

## 2019-03-26 DIAGNOSIS — Z1211 Encounter for screening for malignant neoplasm of colon: Secondary | ICD-10-CM | POA: Diagnosis present

## 2019-03-26 DIAGNOSIS — L538 Other specified erythematous conditions: Secondary | ICD-10-CM | POA: Insufficient documentation

## 2019-03-26 DIAGNOSIS — D509 Iron deficiency anemia, unspecified: Secondary | ICD-10-CM | POA: Insufficient documentation

## 2019-03-26 DIAGNOSIS — F329 Major depressive disorder, single episode, unspecified: Secondary | ICD-10-CM | POA: Diagnosis not present

## 2019-03-26 DIAGNOSIS — K3189 Other diseases of stomach and duodenum: Secondary | ICD-10-CM | POA: Diagnosis not present

## 2019-03-26 DIAGNOSIS — R1319 Other dysphagia: Secondary | ICD-10-CM

## 2019-03-26 DIAGNOSIS — K219 Gastro-esophageal reflux disease without esophagitis: Secondary | ICD-10-CM | POA: Diagnosis not present

## 2019-03-26 DIAGNOSIS — I10 Essential (primary) hypertension: Secondary | ICD-10-CM | POA: Insufficient documentation

## 2019-03-26 DIAGNOSIS — Z1239 Encounter for other screening for malignant neoplasm of breast: Secondary | ICD-10-CM

## 2019-03-26 DIAGNOSIS — Z8249 Family history of ischemic heart disease and other diseases of the circulatory system: Secondary | ICD-10-CM | POA: Insufficient documentation

## 2019-03-26 DIAGNOSIS — Z79899 Other long term (current) drug therapy: Secondary | ICD-10-CM | POA: Diagnosis not present

## 2019-03-26 DIAGNOSIS — I4891 Unspecified atrial fibrillation: Secondary | ICD-10-CM | POA: Insufficient documentation

## 2019-03-26 DIAGNOSIS — F419 Anxiety disorder, unspecified: Secondary | ICD-10-CM | POA: Diagnosis not present

## 2019-03-26 DIAGNOSIS — L539 Erythematous condition, unspecified: Secondary | ICD-10-CM | POA: Insufficient documentation

## 2019-03-26 DIAGNOSIS — K6289 Other specified diseases of anus and rectum: Secondary | ICD-10-CM | POA: Insufficient documentation

## 2019-03-26 DIAGNOSIS — R1310 Dysphagia, unspecified: Secondary | ICD-10-CM | POA: Insufficient documentation

## 2019-03-26 DIAGNOSIS — K317 Polyp of stomach and duodenum: Secondary | ICD-10-CM | POA: Diagnosis not present

## 2019-03-26 DIAGNOSIS — Z87891 Personal history of nicotine dependence: Secondary | ICD-10-CM | POA: Insufficient documentation

## 2019-03-26 DIAGNOSIS — D125 Benign neoplasm of sigmoid colon: Secondary | ICD-10-CM | POA: Insufficient documentation

## 2019-03-26 DIAGNOSIS — Z90710 Acquired absence of both cervix and uterus: Secondary | ICD-10-CM | POA: Insufficient documentation

## 2019-03-26 DIAGNOSIS — R12 Heartburn: Secondary | ICD-10-CM

## 2019-03-26 DIAGNOSIS — Z791 Long term (current) use of non-steroidal anti-inflammatories (NSAID): Secondary | ICD-10-CM | POA: Diagnosis not present

## 2019-03-26 DIAGNOSIS — K5730 Diverticulosis of large intestine without perforation or abscess without bleeding: Secondary | ICD-10-CM | POA: Diagnosis not present

## 2019-03-26 HISTORY — PX: ESOPHAGOGASTRODUODENOSCOPY (EGD) WITH PROPOFOL: SHX5813

## 2019-03-26 HISTORY — PX: COLONOSCOPY WITH PROPOFOL: SHX5780

## 2019-03-26 SURGERY — ESOPHAGOGASTRODUODENOSCOPY (EGD) WITH PROPOFOL
Anesthesia: General

## 2019-03-26 MED ORDER — PROPOFOL 10 MG/ML IV BOLUS
INTRAVENOUS | Status: AC
  Filled 2019-03-26: qty 20

## 2019-03-26 MED ORDER — GLYCOPYRROLATE 0.2 MG/ML IJ SOLN
INTRAMUSCULAR | Status: DC | PRN
  Administered 2019-03-26: 0.2 mg via INTRAVENOUS

## 2019-03-26 MED ORDER — PROPOFOL 500 MG/50ML IV EMUL
INTRAVENOUS | Status: AC
  Filled 2019-03-26: qty 50

## 2019-03-26 MED ORDER — SODIUM CHLORIDE 0.9 % IV SOLN
INTRAVENOUS | Status: DC
  Administered 2019-03-26 (×2): via INTRAVENOUS

## 2019-03-26 MED ORDER — PROPOFOL 500 MG/50ML IV EMUL
INTRAVENOUS | Status: DC | PRN
  Administered 2019-03-26: 100 ug/kg/min via INTRAVENOUS

## 2019-03-26 MED ORDER — ACETAMINOPHEN 500 MG PO TABS
ORAL_TABLET | ORAL | Status: AC
  Filled 2019-03-26: qty 2

## 2019-03-26 MED ORDER — PROPOFOL 10 MG/ML IV BOLUS
INTRAVENOUS | Status: DC | PRN
  Administered 2019-03-26 (×6): 20 mg via INTRAVENOUS

## 2019-03-26 MED ORDER — LIDOCAINE HCL (CARDIAC) PF 100 MG/5ML IV SOSY
PREFILLED_SYRINGE | INTRAVENOUS | Status: DC | PRN
  Administered 2019-03-26: 100 mg via INTRAVENOUS

## 2019-03-26 NOTE — H&P (Signed)
[name] Antigua, MD [address] [place], [place], [place], [address] [address], [place], [place], [place], [address] Phone: [phone]  Fax: [phone]  Primary Care Physician:  McLean-Scocuzza, Nino Glow, MD   Pre-Procedure History & Physical: HPI:  [name] [name] is a 53 y.o. female is here for a colonoscopy and EGD.   Past Medical History:  Diagnosis Date  . A-fib ([place])   . Anemia   . Anxiety   . Arthritis   . B12 deficiency   . Chicken pox   . Depression   . Fibroid   . GERD (gastroesophageal reflux disease)   . History of hiatal hernia   . Hypertension   . Migraines     Past Surgical History:  Procedure Laterality Date  . ABDOMINAL HYSTERECTOMY    . APPENDECTOMY  5/11  . C-spine surgery    . CESAREAN SECTION  10/98,9/12  . HYSTERECTOMY ABDOMINAL WITH SALPINGO-OOPHORECTOMY Bilateral 08/05/2018   Procedure: HYSTERECTOMY ABDOMINAL WITH BILATERAL SALPINGO-OOPHORECTOMY;  Surgeon: Brayton Mars, MD;  Location: [facility];  Service: Gynecology;  Laterality: Bilateral;  . TONSILLECTOMY  03/2008  . TUBAL LIGATION      Prior to Admission medications   Medication Sig Start Date End Date Taking? Authorizing Provider  clonazePAM (KLONOPIN) 0.5 MG tablet Take 1 tablet (0.5 mg total) by mouth daily as needed for anxiety. 08/23/18  Yes Cottle, Billey Co., MD  cyanocobalamin (,VITAMIN B-12,) 1000 MCG/ML injection Inject 1 mL (1,000 mcg total) into the muscle every 30 (thirty) days. 08/23/18  Yes Cottle, Billey Co., MD  estradiol (CLIMARA - DOSED IN MG/24 HR) 0.05 mg/24hr patch Place 1 patch (0.05 mg total) onto the skin once a week. 09/23/18  Yes Rubie Maid, MD  flecainide (TAMBOCOR) 50 MG tablet Take 50 mg by mouth 2 (two) times daily as needed.   Yes [provider]  fluticasone (FLONASE) 50 MCG/ACT nasal spray Place 1 spray into both nostrils daily as needed for congestion. 07/22/18  Yes [provider]  ibuprofen (ADVIL,MOTRIN) 600 MG tablet  TAKE 1 TABLET BY MOUTH FOUR TIMES DAILY 08/26/18  Yes Defrancesco, Alanda Slim, MD  lamoTRIgine (LAMICTAL) 200 MG tablet TAKE 1 TABLET BY MOUTH  EVERY NIGHT AT BEDTIME Patient taking differently: Take 200 mg by mouth daily.  02/14/19  Yes Cottle, Billey Co., MD  metoprolol succinate (TOPROL XL) 25 MG 24 hr tablet Take 1 tablet (25 mg total) by mouth daily. 12/16/18  Yes Minna Merritts, MD  Multiple Vitamin (MULTIVITAMIN WITH MINERALS) TABS tablet Take 1 tablet by mouth at bedtime.   Yes [provider]  Oxcarbazepine (TRILEPTAL) 300 MG tablet TAKE 3 TABLETS BY MOUTH  EVERY NIGHT AT BEDTIME 02/14/19  Yes Cottle, Billey Co., MD  pantoprazole (PROTONIX) 40 MG tablet Take 1 tablet (40 mg total) by mouth daily. 30 min before food 02/27/19  Yes McLean-Scocuzza, Nino Glow, MD  sertraline (ZOLOFT) 100 MG tablet Take 1 tablet (100 mg total) by mouth daily. 03/21/19  Yes Cottle, Billey Co., MD  sertraline (ZOLOFT) 50 MG tablet TAKE 1 TABLET BY MOUTH  DAILY 02/19/19  Yes Cottle, Billey Co., MD  tiZANidine (ZANAFLEX) 4 MG capsule TK 1 C PO TID PRN 02/13/19  Yes [provider]  Cholecalciferol (VITAMIN D3) 125 MCG (5000 UT) CAPS Take 1 capsule (5,000 Units total) by mouth daily. Patient not taking: Reported on 03/26/2019 11/28/18   Rubie Maid, MD  Iron-FA-B Cmp-C-Biot-Probiotic (FUSION PLUS) CAPS Take 1  capsule by mouth at bedtime. Patient not taking: Reported on 03/21/2019 11/28/18   Rubie Maid, MD    Allergies as of 03/06/2019 - Review Complete 03/06/2019  Allergen Reaction Noted  . Phenylmercuric nitrate Rash 07/02/2014  . Augmentin [amoxicillin-pot clavulanate] Nausea Only 02/10/2013  . Codeine Nausea Only 02/10/2013  . Diclofenac Nausea Only   . Latex Rash 02/10/2013  . Neomycin Rash 06/26/2015  . Percocet [oxycodone-acetaminophen] Rash 02/10/2013    Family History  Problem Relation Age of Onset  . Diabetes Mother   . Alcohol abuse Father   . Arthritis Father   . Heart disease Father    . Arthritis Sister   . Alcohol abuse Maternal Aunt   . Alcohol abuse Maternal Uncle   . Alcohol abuse Paternal Grandfather   . Lung cancer Paternal Grandfather        smoker   . Arthritis Maternal Grandmother   . Schizophrenia Maternal Grandmother   . Leukemia Maternal Grandfather   . Breast cancer Neg Hx     Social History   Socioeconomic History  . Marital status: Married    Spouse name: Not on file  . Number of children: Not on file  . Years of education: Not on file  . Highest education level: Not on file  Occupational History  . Not on file  Social Needs  . Financial resource strain: Not on file  . Food insecurity    Worry: Not on file    Inability: Not on file  . Transportation needs    Medical: Not on file    Non-medical: Not on file  Tobacco Use  . Smoking status: Former Smoker    Packs/day: 1.00    Years: 10.00    Pack years: 10.00    Types: Cigarettes    Quit date: 08/22/1991    Years since quitting: 27.6  . Smokeless tobacco: Never Used  Substance and Sexual Activity  . Alcohol use: Not Currently    Alcohol/week: 0.0 standard drinks    Comment: twice a year   . Drug use: Not Currently    Types: Marijuana    Comment: not for a couple years  . Sexual activity: Yes    Partners: Male    Birth control/protection: Surgical    Comment: Husband  Lifestyle  . Physical activity    Days per week: Not on file    Minutes per session: Not on file  . Stress: Not on file  Relationships  . Social Herbalist on phone: Not on file    Gets together: Not on file    Attends religious service: Not on file    Active member of club or organization: Not on file    Attends meetings of clubs or organizations: Not on file    Relationship status: Not on file  . Intimate partner violence    Fear of current or ex partner: Not on file    Emotionally abused: Not on file    Physically abused: Not on file    Forced sexual activity: Not on file  Other Topics  Concern  . Not on file  Social History Narrative   [employer]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    Lives with husband and 2 children    Son- 61 yo and daughter 83 yo    Pets: 2 dogs    Caffeine- 2 sodas 20 oz, coffee 1 cup occasionally    Enjoys being outside with family  DPR mom Horton Chin [phone]     Review of Systems: See HPI, otherwise negative ROS  Physical Exam: BP (!) 157/97   Pulse 65   Temp (!) 96.8 F (36 C) (Tympanic)   Resp 18   Ht 5\' 5"  (1.651 m)   Wt 92.5 kg   LMP  (LMP Unknown)   SpO2 98%   BMI 33.95 kg/m  General:   Alert,  pleasant and cooperative in NAD Head:  Normocephalic and atraumatic. Neck:  Supple; no masses or thyromegaly. Lungs:  Clear throughout to auscultation, normal respiratory effort.    Heart:  +S1, +S2, Regular rate and rhythm, No edema. Abdomen:  Soft, nontender and nondistended. Normal bowel sounds, without guarding, and without rebound.   Neurologic:  Alert and  oriented x4;  grossly normal neurologically.  Impression/Plan: [name] [name] is here for a colonoscopy to be performed for average risk screening and EGD for iron def anemia  Risks, benefits, limitations, and alternatives regarding the procedures have been reviewed with the patient.  Questions have been answered.  All parties agreeable.   Virgel Manifold, MD  03/26/2019, 10:06 AM

## 2019-03-26 NOTE — Transfer of Care (Signed)
Immediate Anesthesia Transfer of Care Note  Patient: [name] [name]  Procedure(s) Performed: ESOPHAGOGASTRODUODENOSCOPY (EGD) WITH PROPOFOL (N/A ) COLONOSCOPY WITH PROPOFOL (N/A )  Patient Location: PACU and Endoscopy Unit  Anesthesia Type:MAC  Level of Consciousness: awake  Airway & Oxygen Therapy: Patient Spontanous Breathing  Post-op Assessment: Report given to RN  Post vital signs: Reviewed  Last Vitals:  Vitals Value Taken Time  BP 127/86 03/26/19 1108  Temp    Pulse 86 03/26/19 1109  Resp 24 03/26/19 1109  SpO2 100 % 03/26/19 1109  Vitals shown include unvalidated device data.  Last Pain:  Vitals:   03/26/19 1100  TempSrc: Tympanic  PainSc:          Complications: No apparent anesthesia complications

## 2019-03-26 NOTE — OR Nursing (Signed)
C/0 headache. Pt ok to take tylenol.

## 2019-03-26 NOTE — Op Note (Signed)
[facility] Gastroenterology Patient Name: [name] [name] Procedure Date: 03/26/2019 10:13 AM MRN: [id number] Account #: [id number] Date of Birth: 1966/02/12 Admit Type: Outpatient Age: 53 Room: [facility] ENDO ROOM 4 Gender: Female Note Status: Finalized Procedure:            Colonoscopy Indications:          Screening for colorectal malignant neoplasm Providers:            Cletis Muma B. Bonna Gains MD, MD Referring MD:         Nino Glow Mclean-Scocuzza MD, MD (Referring MD) Medicines:            Monitored Anesthesia Care Complications:        No immediate complications. Procedure:            Pre-Anesthesia Assessment:                       - ASA Grade Assessment: II - A patient with mild                        systemic disease.                       - Prior to the procedure, a History and Physical was                        performed, and patient medications, allergies and                        sensitivities were reviewed. The patient's tolerance of                        previous anesthesia was reviewed.                       - The risks and benefits of the procedure and the                        sedation options and risks were discussed with the                        patient. All questions were answered and informed                        consent was obtained.                       - Patient identification and proposed procedure were                        verified prior to the procedure by the physician, the                        nurse, the anesthesiologist, the anesthetist and the                        technician. The procedure was verified in the procedure                        room.  After obtaining informed consent, the colonoscope was                        passed under direct vision. Throughout the procedure,                        the patient's blood pressure, pulse, and oxygen                        saturations were monitored  continuously. The                        Colonoscope was introduced through the anus and                        advanced to the the cecum, identified by appendiceal                        orifice and ileocecal valve. The colonoscopy was                        performed with ease. The patient tolerated the                        procedure well. The quality of the bowel preparation                        was good. Findings:      The perianal and digital rectal examinations were normal.      Two sessile polyps were found in the sigmoid colon. The polyps were 4 mm       in size. These polyps were removed with a cold biopsy forceps. Resection       and retrieval were complete.      Multiple diverticula were found in the sigmoid colon.      The exam was otherwise without abnormality.      The rectum, sigmoid colon, descending colon, transverse colon, ascending       colon and cecum appeared normal.      Anal papilla(e) were hypertrophied.      The retroflexed view of the distal rectum and anal verge was normal and       showed no anal or rectal abnormalities. Impression:           - Two 4 mm polyps in the sigmoid colon, removed with a                        cold biopsy forceps. Resected and retrieved.                       - Diverticulosis in the sigmoid colon.                       - The examination was otherwise normal.                       - The rectum, sigmoid colon, descending colon,                        transverse colon, ascending colon and cecum are normal.                       -  Anal papilla(e) were hypertrophied.                       - The distal rectum and anal verge are normal on                        retroflexion view. Recommendation:       - Discharge patient to home (with escort).                       - High fiber diet.                       - Advance diet as tolerated.                       - Continue present medications.                       - Await pathology  results.                       - Repeat colonoscopy in 5 years.                       - The findings and recommendations were discussed with                        the patient.                       - The findings and recommendations were discussed with                        the patient's family.                       - Return to primary care physician as previously                        scheduled. Procedure Code(s):    --- Professional ---                       [phone], Colonoscopy, flexible; with biopsy, single or                        multiple Diagnosis Code(s):    --- Professional ---                       Z12.11, Encounter for screening for malignant neoplasm                        of colon                       K63.5, Polyp of colon                       K62.89, Other specified diseases of anus and rectum                       K57.30, Diverticulosis of large intestine without  perforation or abscess without bleeding CPT copyright 2019 American Medical Association. All rights reserved. The codes documented in this report are preliminary and upon coder review may  be revised to meet current compliance requirements.  Vonda Antigua, MD Margretta Sidle B. Bonna Gains MD, MD 03/26/2019 11:06:30 AM This report has been signed electronically. Number of Addenda: 0 Note Initiated On: 03/26/2019 10:13 AM Scope Withdrawal Time: 0 hours 18 minutes 57 seconds  Total Procedure Duration: 0 hours 22 minutes 5 seconds  Estimated Blood Loss: Estimated blood loss: none.      [facility]

## 2019-03-26 NOTE — Op Note (Addendum)
[facility] Gastroenterology Patient Name: [name] [name] Procedure Date: 03/26/2019 10:13 AM MRN: [id number] Account #: [id number] Date of Birth: Jul 26, 1966 Admit Type: Outpatient Age: 53 Room: [facility] ENDO ROOM 4 Gender: Female Note Status: Finalized Procedure:            Upper GI endoscopy Indications:          Iron deficiency anemia, Dysphagia, Heartburn Providers:            Varnita B. Bonna Gains MD, MD Referring MD:         Nino Glow Mclean-Scocuzza MD, MD (Referring MD) Medicines:            Monitored Anesthesia Care Complications:        No immediate complications. Procedure:            Pre-Anesthesia Assessment:                       - Prior to the procedure, a History and Physical was                        performed, and patient medications, allergies and                        sensitivities were reviewed. The patient's tolerance of                        previous anesthesia was reviewed.                       - The risks and benefits of the procedure and the                        sedation options and risks were discussed with the                        patient. All questions were answered and informed                        consent was obtained.                       - Patient identification and proposed procedure were                        verified prior to the procedure by the physician, the                        nurse, the anesthesiologist, the anesthetist and the                        technician. The procedure was verified in the procedure                        room.                       - ASA Grade Assessment: II - A patient with mild                        systemic disease.  After obtaining informed consent, the endoscope was                        passed under direct vision. Throughout the procedure,                        the patient's blood pressure, pulse, and oxygen                        saturations were monitored  continuously. The Endoscope                        was introduced through the mouth, and advanced to the                        second part of duodenum. The upper GI endoscopy was                        accomplished with ease. The patient tolerated the                        procedure well. Findings:      The examined esophagus was normal. Biopsies were obtained from the       proximal and distal esophagus with cold forceps for histology of       suspected eosinophilic esophagitis.      Patchy mildly erythematous mucosa without bleeding was found in the       gastric antrum. Biopsies were taken with a cold forceps for histology.       Biopsies were obtained in the gastric body, at the incisura and in the       gastric antrum with cold forceps for histology.      Multiple 3 to 4 mm sessile polyps with no bleeding and no stigmata of       recent bleeding were found in the stomach. Biopsies were taken with a       cold forceps for histology.      The duodenal bulb, second portion of the duodenum and examined duodenum       were normal. Biopsies for histology were taken with a cold forceps for       evaluation of celiac disease. Impression:           - Normal esophagus. Biopsied.                       - Erythematous mucosa in the antrum. Biopsied.                       - Multiple gastric polyps. Biopsied.                       - Normal duodenal bulb, second portion of the duodenum                        and examined duodenum. Biopsied.                       - Biopsies were obtained in the gastric body, at the                        incisura and in the  gastric antrum. Recommendation:       - Await pathology results.                       - Discharge patient to home (with escort).                       - Advance diet as tolerated.                       - Continue present medications.                       - Patient has a contact number available for                        emergencies. The  signs and symptoms of potential                        delayed complications were discussed with the patient.                        Return to normal activities tomorrow. Written discharge                        instructions were provided to the patient.                       - Discharge patient to home (with escort).                       - The findings and recommendations were discussed with                        the patient.                       - The findings and recommendations were discussed with                        the patient's family. Procedure Code(s):    --- Professional ---                       [phone], Esophagogastroduodenoscopy, flexible, transoral;                        with biopsy, single or multiple Diagnosis Code(s):    --- Professional ---                       K31.89, Other diseases of stomach and duodenum                       K31.7, Polyp of stomach and duodenum                       D50.9, Iron deficiency anemia, unspecified                       R13.10, Dysphagia, unspecified                       R12, Heartburn CPT copyright 2019 American Medical Association. All rights reserved. The codes documented  in this report are preliminary and upon coder review may  be revised to meet current compliance requirements.  Vonda Antigua, MD Margretta Sidle B. Bonna Gains MD, MD 03/26/2019 10:37:22 AM This report has been signed electronically. Number of Addenda: 0 Note Initiated On: 03/26/2019 10:13 AM Estimated Blood Loss: Estimated blood loss: none.      [facility]

## 2019-03-26 NOTE — Anesthesia Post-op Follow-up Note (Signed)
Anesthesia QCDR form completed.        

## 2019-03-26 NOTE — Anesthesia Preprocedure Evaluation (Addendum)
Anesthesia Evaluation  Patient identified by MRN, date of birth, ID band Patient awake    Reviewed: Allergy & Precautions, NPO status , Patient's Chart, lab work & pertinent test results  History of Anesthesia Complications Negative for: history of anesthetic complications  Airway Mallampati: III       Dental   Pulmonary neg sleep apnea, neg COPD, former smoker,           Cardiovascular hypertension, Pt. on medications (-) Past MI and (-) CHF + dysrhythmias (hx of afib, no problems in years) Atrial Fibrillation (-) Valvular Problems/Murmurs     Neuro/Psych neg Seizures Anxiety Depression Bipolar Disorder    GI/Hepatic Neg liver ROS, hiatal hernia, GERD  Medicated,  Endo/Other  neg diabetes  Renal/GU Renal disease (stones)     Musculoskeletal   Abdominal   Peds  Hematology  (+) anemia ,   Anesthesia Other Findings   Reproductive/Obstetrics                            Anesthesia Physical Anesthesia Plan  ASA: III  Anesthesia Plan: General   Post-op Pain Management:    Induction:   PONV Risk Score and Plan: 3 and TIVA, Propofol infusion and Treatment may vary due to age or medical condition  Airway Management Planned: Nasal Cannula  Additional Equipment:   Intra-op Plan:   Post-operative Plan:   Informed Consent: I have reviewed the patients History and Physical, chart, labs and discussed the procedure including the risks, benefits and alternatives for the proposed anesthesia with the patient or authorized representative who has indicated his/her understanding and acceptance.       Plan Discussed with:   Anesthesia Plan Comments:         Anesthesia Quick Evaluation

## 2019-03-26 NOTE — Anesthesia Postprocedure Evaluation (Signed)
Anesthesia Post Note  Patient: [name] [name]  Procedure(s) Performed: ESOPHAGOGASTRODUODENOSCOPY (EGD) WITH PROPOFOL (N/A ) COLONOSCOPY WITH PROPOFOL (N/A )  Patient location during evaluation: Endoscopy Anesthesia Type: General Level of consciousness: awake and alert Pain management: pain level controlled Vital Signs Assessment: post-procedure vital signs reviewed and stable Respiratory status: spontaneous breathing and respiratory function stable Cardiovascular status: stable Anesthetic complications: no     Last Vitals:  Vitals:   03/26/19 1110 03/26/19 1120  BP: 127/86 (!) 157/105  Pulse: 74 60  Resp: 15 15  Temp:    SpO2: 99% 98%    Last Pain:  Vitals:   03/26/19 1100  TempSrc: Tympanic  PainSc:                  KEPHART,WILLIAM K

## 2019-03-27 ENCOUNTER — Encounter: Payer: Self-pay | Admitting: Gastroenterology

## 2019-03-27 LAB — SURGICAL PATHOLOGY

## 2019-04-01 ENCOUNTER — Encounter: Payer: Self-pay | Admitting: Gastroenterology

## 2019-04-10 ENCOUNTER — Encounter: Payer: Self-pay | Admitting: Gastroenterology

## 2019-04-10 ENCOUNTER — Ambulatory Visit (INDEPENDENT_AMBULATORY_CARE_PROVIDER_SITE_OTHER): Payer: Managed Care, Other (non HMO) | Admitting: Gastroenterology

## 2019-04-10 ENCOUNTER — Other Ambulatory Visit: Payer: Self-pay

## 2019-04-10 VITALS — BP 134/81 | HR 66 | Temp 95.0°F | Ht 65.0 in | Wt 204.6 lb

## 2019-04-10 DIAGNOSIS — K219 Gastro-esophageal reflux disease without esophagitis: Secondary | ICD-10-CM

## 2019-04-10 NOTE — Progress Notes (Signed)
Vonda Antigua, MD [address]  [place]  [place], [place] [address]  Main: [phone]  Fax: [phone]   Primary Care Physician: McLean-Scocuzza, Nino Glow, MD   Chief Complaint  Patient presents with  . Follow-up    iron deficiency anemia    HPI: [name] [name] is a 53 y.o. female here for follow-up of iron deficiency anemia.  This has resolved with latest blood work in July 2020 showing a normal hemoglobin above 13, and improved iron levels.  Patient is on Protonix once daily which controls her indigestion symptoms.  Without this she has severe symptoms.  The patient denies abdominal or flank pain, anorexia, nausea or vomiting, dysphagia, change in bowel habits or black or bloody stools or weight loss.  Does report very occasional symptoms once every other month of a tightness in her chest without exertion that last a few seconds and is relieved.   August 2020 procedures: Impression:           - Normal esophagus. Biopsied.                       - Erythematous mucosa in the antrum. Biopsied.                       - Multiple gastric polyps. Biopsied.                       - Normal duodenal bulb, second portion of the duodenum                        and examined duodenum. Biopsied.                       - Biopsies were obtained in the gastric body, at the                        incisura and in the gastric antrum.   Impression:           - Two 4 mm polyps in the sigmoid colon, removed with a                        cold biopsy forceps. Resected and retrieved.                       - Diverticulosis in the sigmoid colon.                       - The examination was otherwise normal.                       - The rectum, sigmoid colon, descending colon,                        transverse colon, ascending colon and cecum are normal.                       - Anal papilla(e) were hypertrophied.                       - The distal rectum and anal verge are normal on                  retroflexion  view.  Pathology: A. DUODENUM; COLD BIOPSY:  - DUODENAL MUCOSA WITH INTACT VILLI.  - NEGATIVE FOR ACTIVE INFLAMMATION, INTRAEPITHELIAL LYMPHOCYTOSIS, AND  INFECTIOUS AGENTS.   B. STOMACH, ERYTHEMA; COLD BIOPSY:  - ANTRAL AND OXYNTIC MUCOSA WITHOUT SPECIFIC PATHOLOGIC CHANGES.  - NEGATIVE FOR H. PYLORI, INTESTINAL METAPLASIA, DYSPLASIA, AND  MALIGNANCY.   C. STOMACH POLYP X 1; COLD BIOPSY:  - FUNDIC GLAND POLYP.  - NEGATIVE FOR DYSPLASIA AND MALIGNANCY.   D. ESOPHAGUS, DISTAL; COLD BIOPSY:  - STRATIFIED SQUAMOUS EPITHELIUM WITH VASCULAR LAKES AND FOCAL BASAL  HYPERPLASIA, SUGGESTIVE OF REFLUX ESOPHAGITIS.  - NEGATIVE FOR INTRAEPITHELIAL EOSINOPHILS AND NEUTROPHILS.  - NEGATIVE FOR DYSPLASIA AND MALIGNANCY.   E. ESOPHAGUS, PROXIMAL; COLD BIOPSY:  - STRATIFIED SQUAMOUS EPITHELIUM WITHOUT EOSINOPHILS, NEUTROPHILS, OR  REACTIVE CHANGES.  - NEGATIVE FOR DYSPLASIA AND MALIGNANCY.   F. COLON POLYP X 2, SIGMOID; COLD BIOPSY:  - HYPERPLASTIC POLYPS, 2 FRAGMENTS.  - NEGATIVE FOR DYSPLASIA AND MALIGNANCY.   Current Outpatient Medications  Medication Sig Dispense Refill  . Cholecalciferol (VITAMIN D3) 125 MCG (5000 UT) CAPS Take 1 capsule (5,000 Units total) by mouth daily. 30 capsule 11  . clonazePAM (KLONOPIN) 0.5 MG tablet Take 1 tablet (0.5 mg total) by mouth daily as needed for anxiety. 30 tablet 5  . cyanocobalamin (,VITAMIN B-12,) 1000 MCG/ML injection Inject 1 mL (1,000 mcg total) into the muscle every 30 (thirty) days. 10 mL 1  . estradiol (CLIMARA - DOSED IN MG/24 HR) 0.05 mg/24hr patch Place 1 patch (0.05 mg total) onto the skin once a week. 4 patch 12  . flecainide (TAMBOCOR) 50 MG tablet Take 50 mg by mouth 2 (two) times daily as needed.    . fluticasone (FLONASE) 50 MCG/ACT nasal spray Place 1 spray into both nostrils daily as needed for congestion.  0  . ibuprofen (ADVIL,MOTRIN) 600 MG tablet TAKE 1 TABLET BY MOUTH FOUR TIMES DAILY 50 tablet  0  . Insulin Syringe-Needle U-100 (INSULIN SYRINGE 1CC/31GX5/16") 31G X 5/16" 1 ML MISC U UTD    . Iron-FA-B Cmp-C-Biot-Probiotic (FUSION PLUS) CAPS Take 1 capsule by mouth at bedtime. 60 capsule 1  . lamoTRIgine (LAMICTAL) 200 MG tablet TAKE 1 TABLET BY MOUTH  EVERY NIGHT AT BEDTIME (Patient taking differently: Take 200 mg by mouth daily. ) 90 tablet 1  . metoprolol succinate (TOPROL XL) 25 MG 24 hr tablet Take 1 tablet (25 mg total) by mouth daily. 90 tablet 3  . Multiple Vitamin (MULTIVITAMIN WITH MINERALS) TABS tablet Take 1 tablet by mouth at bedtime.    Marland Kitchen omeprazole (PRILOSEC) 20 MG capsule     . Oxcarbazepine (TRILEPTAL) 300 MG tablet TAKE 3 TABLETS BY MOUTH  EVERY NIGHT AT BEDTIME 270 tablet 1  . pantoprazole (PROTONIX) 40 MG tablet Take 1 tablet (40 mg total) by mouth daily. 30 min before food 90 tablet 3  . progesterone (PROMETRIUM) 200 MG capsule TK 1 C PO QHS    . sertraline (ZOLOFT) 100 MG tablet Take 1 tablet (100 mg total) by mouth daily. 90 tablet 1  . sertraline (ZOLOFT) 50 MG tablet TAKE 1 TABLET BY MOUTH  DAILY 90 tablet 0  . tiZANidine (ZANAFLEX) 4 MG capsule TK 1 C PO TID PRN     No current facility-administered medications for this visit.     Allergies as of 04/10/2019 - Review Complete 04/10/2019  Allergen Reaction Noted  . Phenylmercuric nitrate Rash 07/02/2014  . Augmentin [amoxicillin-pot clavulanate] Nausea Only 02/10/2013  . Codeine Nausea Only 02/10/2013  .  Diclofenac Nausea Only   . Latex Rash 02/10/2013  . Neomycin Rash 06/26/2015  . Percocet [oxycodone-acetaminophen] Rash 02/10/2013    ROS:  General: Negative for anorexia, weight loss, fever, chills, fatigue, weakness. ENT: Negative for hoarseness, difficulty swallowing , nasal congestion. CV: Negative for chest pain, angina, palpitations, dyspnea on exertion, peripheral edema.  Respiratory: Negative for dyspnea at rest, dyspnea on exertion, cough, sputum, wheezing.  GI: See history of present  illness. GU:  Negative for dysuria, hematuria, urinary incontinence, urinary frequency, nocturnal urination.  Endo: Negative for unusual weight change.    Physical Examination:   BP 134/81   Pulse 66   Temp (!) 95 F (35 C) (Skin)   Ht 5\' 5"  (1.651 m)   Wt 204 lb 9.6 oz (92.8 kg)   LMP  (LMP Unknown)   BMI 34.05 kg/m   General: Well-nourished, well-developed in no acute distress.  Eyes: No icterus. Conjunctivae pink. Mouth: Oropharyngeal mucosa moist and pink , no lesions erythema or exudate. Neck: Supple, Trachea midline Abdomen: Bowel sounds are normal, nontender, nondistended, no hepatosplenomegaly or masses, no abdominal bruits or hernia , no rebound or guarding.   Extremities: No lower extremity edema. No clubbing or deformities. Neuro: Alert and oriented x 3.  Grossly intact. Skin: Warm and dry, no jaundice.   Psych: Alert and cooperative, normal mood and affect.   Labs: CMP     Component Value Date/Time   NA 140 03/07/2019 1227   NA 138 11/25/2014 2152   K 4.6 03/07/2019 1227   K 3.7 11/25/2014 2152   CL 102 03/07/2019 1227   CL 105 11/25/2014 2152   CO2 20 03/07/2019 1227   CO2 28 11/25/2014 2152   GLUCOSE 95 03/07/2019 1227   GLUCOSE 108 (H) 12/26/2016 0812   GLUCOSE 98 11/25/2014 2152   BUN 15 03/07/2019 1227   BUN 10 11/25/2014 2152   CREATININE 0.84 03/07/2019 1227   CREATININE 0.81 11/25/2014 2152   CALCIUM 10.9 (H) 03/07/2019 1227   CALCIUM 10.0 11/25/2014 2152   PROT 7.5 03/07/2019 1227   PROT 7.2 11/25/2014 2152   ALBUMIN 4.8 03/07/2019 1227   ALBUMIN 4.1 11/25/2014 2152   AST 19 03/07/2019 1227   AST 15 11/25/2014 2152   ALT 14 03/07/2019 1227   ALT 13 (L) 11/25/2014 2152   ALKPHOS 118 (H) 03/07/2019 1227   ALKPHOS 61 11/25/2014 2152   BILITOT 0.2 03/07/2019 1227   BILITOT 0.4 11/25/2014 2152   GFRNONAA 81 03/07/2019 1227   GFRNONAA >60 11/25/2014 2152   GFRAA 93 03/07/2019 1227   GFRAA >60 11/25/2014 2152   Lab Results  Component  Value Date   WBC 6.5 03/07/2019   HGB 13.9 03/07/2019   HCT 40.9 03/07/2019   MCV 83 03/07/2019   PLT 338 03/07/2019    Imaging Studies: No results found.  Assessment and Plan:   [name] [name] is a 53 y.o. y/o female here for follow-up of GERD and iron deficiency anemia  GERD well controlled with once daily PPI Patient reports significant symptoms without PPI and would like to continue on the medication (Risks of PPI use were discussed with patient including bone loss, C. Diff diarrhea, pneumonia, infections, CKD, electrolyte abnormalities. Pt. Verbalizes understanding and chooses to continue the medication.)  Occasional symptoms as described in HPI of intermittent tightness in her chest, lasting a few seconds could be due to esophageal spasm. Not related to exertion or with shortness of breath. Motility study discussed but pt refuses  at this time.   No alarm symptoms present  Patient's iron deficiency anemia was likely due to menorrhagia which has resolved after hysterectomy.  Hemoglobin was lower prior to the hysterectomy and now is normalized, consistent with her previous menorrhagia being the etiology.   If anemia reoccurs her ferritin does not continue to improve can consider small bowel capsule study.  PCP can refer patient to [place] again at that time or notify us if this occurs.  Currently it is continuing to improve as expected  Follow-up as needed  Dr Vonda Antigua

## 2019-04-29 ENCOUNTER — Telehealth: Payer: Self-pay | Admitting: Cardiovascular Disease

## 2019-04-29 NOTE — Telephone Encounter (Signed)
Pt c/o BP issue: STAT if pt c/o blurred vision, one-sided weakness or slurred speech  1. What are your last 5 BP readings? 9/5-173/93 9/2-180/90 142/73-not sure what day last week  2. Are you having any other symptoms (ex. Dizziness, headache, blurred vision, passed out)? Headache, lightheaded  3. What is your BP issue? Fluctuating, thinks it is her Metoprolol

## 2019-04-30 ENCOUNTER — Other Ambulatory Visit: Payer: Self-pay

## 2019-04-30 ENCOUNTER — Encounter: Payer: Self-pay | Admitting: Nurse Practitioner

## 2019-04-30 ENCOUNTER — Ambulatory Visit (INDEPENDENT_AMBULATORY_CARE_PROVIDER_SITE_OTHER): Payer: Managed Care, Other (non HMO) | Admitting: Nurse Practitioner

## 2019-04-30 ENCOUNTER — Ambulatory Visit (INDEPENDENT_AMBULATORY_CARE_PROVIDER_SITE_OTHER): Payer: Managed Care, Other (non HMO)

## 2019-04-30 VITALS — BP 120/80 | HR 68 | Ht 65.0 in | Wt 206.8 lb

## 2019-04-30 DIAGNOSIS — I10 Essential (primary) hypertension: Secondary | ICD-10-CM | POA: Diagnosis not present

## 2019-04-30 DIAGNOSIS — R0683 Snoring: Secondary | ICD-10-CM

## 2019-04-30 DIAGNOSIS — I480 Paroxysmal atrial fibrillation: Secondary | ICD-10-CM | POA: Diagnosis not present

## 2019-04-30 NOTE — Patient Instructions (Signed)
Medication Instructions:  Your physician recommends that you continue on your current medications as directed. Please refer to the Current Medication list given to you today.  If you need a refill on your cardiac medications before your next appointment, please call your pharmacy.   Lab work: Art gallery manager, Psychologist, occupational, Mag Please take the lab orders given to you today to [facility]  If you have labs (blood work) drawn today and your tests are completely normal, you will receive your results only by: Marland Kitchen MyChart Message (if you have MyChart) OR . A paper copy in the mail If you have any lab test that is abnormal or we need to change your treatment, we will call you to review the results.  Testing/Procedures: Your physician has recommended that you wear a zio monitor. Zio monitors are medical devices that record the heart's electrical activity. Doctors most often use these monitors to diagnose arrhythmias. Arrhythmias are problems with the speed or rhythm of the heartbeat. The monitor is a small, portable device. You can wear one while you do your normal daily activities. This is usually used to diagnose what is causing palpitations/syncope (passing out).    Follow-Up: At [facility], you and your health needs are our priority.  As part of our continuing mission to provide you with exceptional heart care, we have created designated Provider Care Teams.  These Care Teams include your primary Cardiologist (physician) and Advanced Practice Providers (APPs -  Physician Assistants and Nurse Practitioners) who all work together to provide you with the care you need, when you need it. You will need a follow up appointment in 4-6 weeks.  Please call our office 2 months in advance to schedule this appointment.  You may see Ida Rogue, MD or one of the following Advanced Practice Providers on your designated Care Team:   Murray Hodgkins, NP Christell Faith, PA-C . Marrianne Mood, PA-C  Any Other Special Instructions  Will Be Listed Below (If Applicable). Your physician has recommended that you wear a Zio monitor. This monitor is a medical device that records the heart's electrical activity. Doctors most often use these monitors to diagnose arrhythmias. Arrhythmias are problems with the speed or rhythm of the heartbeat. The monitor is a small device applied to your chest. You can wear one while you do your normal daily activities. While wearing this monitor if you have any symptoms to push the button and record what you felt. Once you have worn this monitor for the period of time provider prescribed (Usually 14 days), you will return the monitor device in the postage paid box. Once it is returned they will download the data collected and provide [place] with a report which the provider will then review and we will call you with those results. Important tips:  1. Avoid showering during the first 24 hours of wearing the monitor. 2. Avoid excessive sweating to help maximize wear time. 3. Do not submerge the device, no hot tubs, and no swimming pools. 4. Keep any lotions or oils away from the patch. 5. After 24 hours you may shower with the patch on. Take brief showers with your back facing the shower head.  6. Do not remove patch once it has been placed because that will interrupt data and decrease adhesive wear time. 7. Push the button when you have any symptoms and write down what you were feeling. 8. Once you have completed wearing your monitor, remove and place into box which has postage paid and place in your  outgoing mailbox.  9. If for some reason you have misplaced your box then call our office and we can provide another box and/or mail it off for you.

## 2019-04-30 NOTE — Telephone Encounter (Signed)
Spoke with patient and she reports that over the last couple of days her blood pressures have been all over the place and elevated with the highest being 180/90. Confirmed she is taking metoprolol tartrate 25 mg once daily and she now feels like she may be back in atrial fibrillation. She works upstairs and wanted to know if she could come in to have it checked. Placed on hold to check with providers and scheduled her to come in and see APP at 3pm.

## 2019-04-30 NOTE — Progress Notes (Signed)
Office Visit    Patient Name: [name] [name] Date of Encounter: 04/30/2019  Primary Care Provider:  McLean-Scocuzza, Nino Glow, MD Primary Cardiologist:  Ida Rogue, MD  Chief Complaint    53 year old female with a history of paroxysmal atrial fibrillation diagnosed in 2014, hypertension, iron deficiency anemia, GERD, anxiety, headaches, and snoring, who presents for follow-up secondary to palpitations.  Past Medical History    Past Medical History:  Diagnosis Date   Anxiety    Arthritis    B12 deficiency    Chicken pox    Depression    Fibroid    GERD (gastroesophageal reflux disease)    History of hiatal hernia    Hypertension    Iron deficiency anemia    Menorrhagia    Migraines    PAF (paroxysmal atrial fibrillation) ([place])    a. 04/2013 Echo: EF 55-60%, impaired relaxation. Mild MR/TR. Nl RV fxn; b. CHA2DS2VASc = 2; c. PRN flecainide.   Snores    Past Surgical History:  Procedure Laterality Date   ABDOMINAL HYSTERECTOMY     APPENDECTOMY  5/11   C-spine surgery     CESAREAN SECTION  10/98,9/12   COLONOSCOPY WITH PROPOFOL N/A 03/26/2019   Procedure: COLONOSCOPY WITH PROPOFOL;  Surgeon: Virgel Manifold, MD;  Location: [facility] ENDOSCOPY;  Service: Endoscopy;  Laterality: N/A;   ESOPHAGOGASTRODUODENOSCOPY (EGD) WITH PROPOFOL N/A 03/26/2019   Procedure: ESOPHAGOGASTRODUODENOSCOPY (EGD) WITH PROPOFOL;  Surgeon: Virgel Manifold, MD;  Location: [facility] ENDOSCOPY;  Service: Endoscopy;  Laterality: N/A;   HYSTERECTOMY ABDOMINAL WITH SALPINGO-OOPHORECTOMY Bilateral 08/05/2018   Procedure: HYSTERECTOMY ABDOMINAL WITH BILATERAL SALPINGO-OOPHORECTOMY;  Surgeon: Brayton Mars, MD;  Location: [facility] ORS;  Service: Gynecology;  Laterality: Bilateral;   TONSILLECTOMY  03/2008   TUBAL LIGATION      Allergies  Allergies  Allergen Reactions   Phenylmercuric Nitrate Rash   Augmentin [Amoxicillin-Pot Clavulanate] Nausea Only    Has patient had a  PCN reaction causing immediate rash, facial/tongue/throat swelling, SOB or lightheadedness with hypotension: No Has patient had a PCN reaction causing severe rash involving mucus membranes or skin necrosis: No Has patient had a PCN reaction that required hospitalization: No Has patient had a PCN reaction occurring within the last 10 years: Yes If all of the above answers are "NO", then may proceed with Cephalosporin use.    Codeine Nausea Only   Diclofenac Nausea Only   Latex Rash   Neomycin Rash   Percocet [Oxycodone-Acetaminophen] Rash    Hives     History of Present Illness    53-ye ar-old female with the above past medical history including paroxysmal atrial fibrillation, which was diagnosed in 2014.  She was initially managed with calcium channel blocker therapy but had bradycardia resulting in discontinuation.  She was later placed on low-dose metoprolol and flecainide.  Flecainide was initially prescribed as 50 mg twice daily but in the absence of symptoms, she has been taking it on a as needed basis only and has not required this in years.  As noted above, other history includes hypertension, iron deficiency anemia, GERD, anxiety, headaches, and snoring.  Regarding snoring, she feels that this may have gotten worse over the years and is interested in pursuing a sleep study.  She was last seen via telemedicine visit in April.  At that visit, it was noted that she was taking short acting metoprolol once nightly and she was switched to Toprol XL 25 mg daily.  For the most part, she has done well since  then but has noted intermittent fatigue and more recently, variable blood pressures in the setting of headaches.  In the setting of a headache, pressure may rise up to 170 though in the absence of a headache is much more likely to be in the normal range.  She says she does not sleep well at night and feels that sleep apnea may be playing a role and in that setting, there is some degree of  fatigue and daytime somnolence.  She awoke this morning about 6 AM with irregular palpitations.  She says her heart rate was in the 80s while her blood pressure was 100/60.  She felt very fatigued.  Palpitations persisted throughout the morning and she went into work late, at about 11 AM.  She called our office around 1:30 seeking input and an appointment, as she was concerned that perhaps she was having more A. fib.  After being given an appointment for 3:00, symptoms resolved at about 2 PM.  She is in sinus rhythm currently with a blood pressure of 120/80.  She denies chest pain, dyspnea, PND, orthopnea, dizziness, syncope, edema, or early satiety.  Home Medications    Prior to Admission medications   Medication Sig Start Date End Date Taking? Authorizing Provider  aspirin EC 81 MG tablet Take 81 mg by mouth daily.   Yes [provider]  Cholecalciferol (VITAMIN D3) 125 MCG (5000 UT) CAPS Take 1 capsule (5,000 Units total) by mouth daily. 11/28/18  Yes Rubie Maid, MD  clonazePAM (KLONOPIN) 0.5 MG tablet Take 1 tablet (0.5 mg total) by mouth daily as needed for anxiety. 08/23/18  Yes Cottle, Billey Co., MD  cyanocobalamin (,VITAMIN B-12,) 1000 MCG/ML injection Inject 1 mL (1,000 mcg total) into the muscle every 30 (thirty) days. 08/23/18  Yes Cottle, Billey Co., MD  estradiol (CLIMARA - DOSED IN MG/24 HR) 0.05 mg/24hr patch Place 1 patch (0.05 mg total) onto the skin once a week. 09/23/18  Yes Rubie Maid, MD  flecainide (TAMBOCOR) 50 MG tablet Take 50 mg by mouth 2 (two) times daily as needed.   Yes [provider]  fluticasone (FLONASE) 50 MCG/ACT nasal spray Place 1 spray into both nostrils daily as needed for congestion. 07/22/18  Yes [provider]  ibuprofen (ADVIL,MOTRIN) 600 MG tablet TAKE 1 TABLET BY MOUTH FOUR TIMES DAILY 08/26/18  Yes Defrancesco, Alanda Slim, MD  Insulin Syringe-Needle U-100 (INSULIN SYRINGE 1CC/31GX5/16") 31G X 5/16" 1 ML MISC U UTD 03/25/19  Yes  [provider]  lamoTRIgine (LAMICTAL) 200 MG tablet TAKE 1 TABLET BY MOUTH  EVERY NIGHT AT BEDTIME Patient taking differently: Take 200 mg by mouth daily.  02/14/19  Yes Cottle, Billey Co., MD  metoprolol succinate (TOPROL XL) 25 MG 24 hr tablet Take 1 tablet (25 mg total) by mouth daily. 12/16/18  Yes Minna Merritts, MD  Multiple Vitamin (MULTIVITAMIN WITH MINERALS) TABS tablet Take 1 tablet by mouth at bedtime.   Yes [provider]  Oxcarbazepine (TRILEPTAL) 300 MG tablet TAKE 3 TABLETS BY MOUTH  EVERY NIGHT AT BEDTIME 02/14/19  Yes Cottle, Billey Co., MD  pantoprazole (PROTONIX) 40 MG tablet Take 1 tablet (40 mg total) by mouth daily. 30 min before food 02/27/19  Yes McLean-Scocuzza, Nino Glow, MD  progesterone (PROMETRIUM) 200 MG capsule TK 1 C PO QHS 03/25/19  Yes [provider]  sertraline (ZOLOFT) 100 MG tablet Take 1 tablet (100 mg total) by mouth daily. 03/21/19  Yes Cottle, Allayne Butcher  Brooke Bonito., MD  tiZANidine (ZANAFLEX) 4 MG capsule TK 1 C PO TID PRN 02/13/19  Yes [provider]    Review of Systems    Daytime somnolence, fatigue, headaches, and palpitations this morning.  She denies chest pain, dyspnea, PND, orthopnea, dizziness, syncope, edema, or early satiety.  All other systems reviewed and are otherwise negative except as noted above.  Physical Exam    VS:  BP 120/80 (BP Location: Left Arm, Patient Position: Sitting, Cuff Size: Normal)    Pulse 68    Ht 5\' 5"  (1.651 m)    Wt 206 lb 12 oz (93.8 kg)    LMP  (LMP Unknown)    SpO2 95%    BMI 34.41 kg/m  , BMI Body mass index is 34.41 kg/m. GEN: Well nourished, well developed, in no acute distress. HEENT: normal. Neck: Supple, no JVD, carotid bruits, or masses. Cardiac: RRR, no murmurs, rubs, or gallops. No clubbing, cyanosis, edema.  Radials/PT 2+ and equal bilaterally.  Respiratory:  Respirations regular and unlabored, clear to auscultation bilaterally. GI: Soft, nontender, nondistended, BS + x  4. MS: no deformity or atrophy. Skin: warm and dry, no rash. Neuro:  Strength and sensation are intact. Psych: Normal affect.  Accessory Clinical Findings    ECG personally reviewed by me today -regular sinus rhythm, 68, nonspecific T changes- no acute changes.  Lab Results  Component Value Date   WBC 6.5 03/07/2019   HGB 13.9 03/07/2019   HCT 40.9 03/07/2019   MCV 83 03/07/2019   PLT 338 03/07/2019   Lab Results  Component Value Date   CREATININE 0.84 03/07/2019   BUN 15 03/07/2019   NA 140 03/07/2019   K 4.6 03/07/2019   CL 102 03/07/2019   CO2 20 03/07/2019   Lab Results  Component Value Date   ALT 14 03/07/2019   AST 19 03/07/2019   ALKPHOS 118 (H) 03/07/2019   BILITOT 0.2 03/07/2019   Lab Results  Component Value Date   CHOL 217 (H) 03/07/2019   HDL 68 03/07/2019   LDLCALC 124 (H) 03/07/2019   TRIG 126 03/07/2019   CHOLHDL 3.2 03/07/2019     Assessment & Plan    1.  Paroxysmal atrial fibrillation/palpitations: Patient with a history of paroxysmal atrial fibrillation diagnosed in 2014.  She has been on daily beta-blocker therapy and has been using flecainide on a as needed basis though notes that she has not used this in years.  She had a prolonged episode of palpitations this morning with stable heart rates in the 80s.  She noted irregularity and felt on edge during this episode which lasted from 6 AM to about 2 PM today.  She is currently in sinus rhythm with stable heart rate and blood pressure.  She is concerned that she may be having more A. fib and also notes that she snores quite a bit and is concerned that she may have sleep apnea.  I will check a basic metabolic panel, magnesium, and TSH today.  I am also going to place the Zio monitor to assess for recurrent arrhythmias and will refer to pulmonology for sleep study.  Continue current dose of beta-blocker and we reviewed usage of flecainide for recurrent A. fib.  If she is seen to have a large burden of A.  fib, she may need to go back on twice daily flecainide and in the setting of a CHA2DS2VASc of 2, would also need to reconsider oral anticoagulation.  Otherwise, plan to follow-up  in approximately 1 month.  2.  Essential hypertension: She has had variable blood pressures over the past few weeks, most notably, she is trending high during episodes of headaches.  Pressure is 120/80 today.  I am continuing her current dose of metoprolol.  She will continue to follow blood pressure at home.  3.  Snoring: Patient says she has a long history of snoring and is concerned that she has sleep apnea.  She says she frequently awakes at night and has not had a restful night of sleep a long time.  This leads to daytime fatigue and somnolence and also headaches.  She is interested in referral to pulmonology for sleep study and we will place this today.  We discussed the importance of appropriate sleep apnea treatment, if present, in the management of A. fib.  4.  Disposition: Follow-up lab work in 0 monitor.  Refer for sleep study.  Follow-up in clinic in 1 month or sooner if necessary.  Murray Hodgkins, NP 04/30/2019, 3:51 PM

## 2019-04-30 NOTE — Telephone Encounter (Signed)
Patient is calling back, states she is in active afib. Please call.

## 2019-05-01 ENCOUNTER — Institutional Professional Consult (permissible substitution): Payer: Managed Care, Other (non HMO) | Admitting: Internal Medicine

## 2019-05-03 LAB — TSH: TSH: 1.13 u[IU]/mL (ref 0.450–4.500)

## 2019-05-03 LAB — BASIC METABOLIC PANEL
BUN/Creatinine Ratio: 15 (ref 9–23)
BUN: 11 mg/dL (ref 6–24)
CO2: 24 mmol/L (ref 20–29)
Calcium: 10.3 mg/dL — ABNORMAL HIGH (ref 8.7–10.2)
Chloride: 102 mmol/L (ref 96–106)
Creatinine, Ser: 0.75 mg/dL (ref 0.57–1.00)
GFR calc Af Amer: 107 mL/min/{1.73_m2} (ref >59)
GFR calc non Af Amer: 93 mL/min/{1.73_m2} (ref >59)
Glucose: 82 mg/dL (ref 65–99)
Potassium: 4.2 mmol/L (ref 3.5–5.2)
Sodium: 140 mmol/L (ref 134–144)

## 2019-05-03 LAB — MAGNESIUM: Magnesium: 2.2 mg/dL (ref 1.6–2.3)

## 2019-05-05 ENCOUNTER — Encounter: Payer: Managed Care, Other (non HMO) | Admitting: Internal Medicine

## 2019-05-05 ENCOUNTER — Other Ambulatory Visit: Payer: Self-pay

## 2019-05-05 NOTE — Progress Notes (Signed)
 This encounter was created in error - please disregard.

## 2019-05-06 NOTE — Addendum Note (Signed)
Addended by: Britt Bottom on: 05/06/2019 07:49 AM   Modules accepted: Orders

## 2019-05-16 ENCOUNTER — Ambulatory Visit: Payer: 59 | Admitting: Psychiatry

## 2019-05-27 ENCOUNTER — Other Ambulatory Visit: Payer: Self-pay | Admitting: Internal Medicine

## 2019-05-27 DIAGNOSIS — K219 Gastro-esophageal reflux disease without esophagitis: Secondary | ICD-10-CM

## 2019-05-27 MED ORDER — PANTOPRAZOLE SODIUM 40 MG PO TBEC: 40.0000 mg | DELAYED_RELEASE_TABLET | Freq: Every day | ORAL | 3 refills | Status: DC

## 2019-05-28 ENCOUNTER — Ambulatory Visit: Payer: Managed Care, Other (non HMO) | Admitting: Gastroenterology

## 2019-05-28 ENCOUNTER — Other Ambulatory Visit: Payer: Self-pay

## 2019-05-28 ENCOUNTER — Encounter: Payer: Self-pay | Admitting: Gastroenterology

## 2019-05-28 VITALS — BP 133/85 | HR 69 | Temp 98.4°F | Wt 203.4 lb

## 2019-05-28 DIAGNOSIS — D509 Iron deficiency anemia, unspecified: Secondary | ICD-10-CM

## 2019-05-28 NOTE — Progress Notes (Deleted)
[name] Darby, MD [address]  [place]  [place], [place] [address]  Main: [phone]  Fax: [phone] Pager: [phone]   Primary Care Physician: McLean-Scocuzza, Nino Glow, MD  Primary Gastroenterologist:  Dr. Cephas [name]  Chief Complaint  Patient presents with  . Hemorrhoids    Patient has had rectal pain and rectal bleeding for the last 3 weeks. Patient states that she does not want to have banding because she heard it was painful from a friend     HPI: [name] [name] is a 53 y.o. female  Current Outpatient Medications  Medication Sig Dispense Refill  . aspirin EC 81 MG tablet Take 81 mg by mouth daily.    . Cholecalciferol (VITAMIN D3) 125 MCG (5000 UT) CAPS Take 1 capsule (5,000 Units total) by mouth daily. 30 capsule 11  . clonazePAM (KLONOPIN) 0.5 MG tablet Take 1 tablet (0.5 mg total) by mouth daily as needed for anxiety. 30 tablet 5  . cyanocobalamin (,VITAMIN B-12,) 1000 MCG/ML injection Inject 1 mL (1,000 mcg total) into the muscle every 30 (thirty) days. 10 mL 1  . estradiol (CLIMARA - DOSED IN MG/24 HR) 0.05 mg/24hr patch Place 1 patch (0.05 mg total) onto the skin once a week. 4 patch 12  . flecainide (TAMBOCOR) 50 MG tablet Take 50 mg by mouth 2 (two) times daily as needed.    . fluticasone (FLONASE) 50 MCG/ACT nasal spray Place 1 spray into both nostrils daily as needed for congestion.  0  . ibuprofen (ADVIL,MOTRIN) 600 MG tablet TAKE 1 TABLET BY MOUTH FOUR TIMES DAILY 50 tablet 0  . Insulin Syringe-Needle U-100 (INSULIN SYRINGE 1CC/31GX5/16") 31G X 5/16" 1 ML MISC U UTD    . lamoTRIgine (LAMICTAL) 200 MG tablet TAKE 1 TABLET BY MOUTH  EVERY NIGHT AT BEDTIME (Patient taking differently: Take 200 mg by mouth daily. ) 90 tablet 1  . Multiple Vitamin (MULTIVITAMIN WITH MINERALS) TABS tablet Take 1 tablet by mouth at bedtime.    . Oxcarbazepine (TRILEPTAL) 300 MG tablet TAKE 3 TABLETS BY MOUTH  EVERY NIGHT AT BEDTIME 270 tablet 1  . pantoprazole  (PROTONIX) 40 MG tablet Take 1 tablet (40 mg total) by mouth daily. 30 min before food 90 tablet 3  . progesterone (PROMETRIUM) 200 MG capsule TK 1 C PO QHS    . sertraline (ZOLOFT) 100 MG tablet Take 1 tablet (100 mg total) by mouth daily. 90 tablet 1  . tiZANidine (ZANAFLEX) 4 MG capsule TK 1 C PO TID PRN     No current facility-administered medications for this visit.     Allergies as of 05/28/2019 - Review Complete 05/28/2019  Allergen Reaction Noted  . Phenylmercuric nitrate Rash 07/02/2014  . Augmentin [amoxicillin-pot clavulanate] Nausea Only 02/10/2013  . Codeine Nausea Only 02/10/2013  . Diclofenac Nausea Only   . Latex Rash 02/10/2013  . Neomycin Rash 06/26/2015  . Percocet [oxycodone-acetaminophen] Rash 02/10/2013    NSAIDs: ***  Antiplts/Anticoagulants/Anti thrombotics: ***  GI procedures: ***  ROS:  General: Negative for anorexia, weight loss, fever, chills, fatigue, weakness. ENT: Negative for hoarseness, difficulty swallowing , nasal congestion. CV: Negative for chest pain, angina, palpitations, dyspnea on exertion, peripheral edema.  Respiratory: Negative for dyspnea at rest, dyspnea on exertion, cough, sputum, wheezing.  GI: See history of present illness. GU:  Negative for dysuria, hematuria, urinary incontinence, urinary frequency, nocturnal urination.  Endo: Negative for unusual weight change.    Physical Examination:   BP 133/85 (BP Location:  Left Arm, Patient Position: Sitting, Cuff Size: Normal)   Pulse 69   Temp 98.4 F (36.9 C) (Oral)   Wt 203 lb 6 oz (92.3 kg)   LMP  (LMP Unknown)   BMI 33.84 kg/m   General: Well-nourished, well-developed in no acute distress.  Eyes: No icterus. Conjunctivae pink. Mouth: Oropharyngeal mucosa moist and pink , no lesions erythema or exudate. Lungs: Clear to auscultation bilaterally. Non-labored. Heart: Regular rate and rhythm, no murmurs rubs or gallops.  Abdomen: Bowel sounds are normal, nontender,  nondistended, no hepatosplenomegaly or masses, no hernia , no rebound or guarding.   Extremities: No lower extremity edema. No clubbing or deformities. Neuro: Alert and oriented x 3.  Grossly intact. Skin: Warm and dry, no jaundice.   Psych: Alert and cooperative, normal mood and affect.   Imaging Studies: No results found.  Assessment and Plan:   [name] [name] is a 53 y.o. female ***  Follow up in ***   Dr Sherri Sear, MD

## 2019-06-03 ENCOUNTER — Ambulatory Visit: Payer: Managed Care, Other (non HMO) | Admitting: Internal Medicine

## 2019-06-03 DIAGNOSIS — Z0289 Encounter for other administrative examinations: Secondary | ICD-10-CM

## 2019-06-04 ENCOUNTER — Ambulatory Visit: Payer: Managed Care, Other (non HMO) | Admitting: Nurse Practitioner

## 2019-06-10 ENCOUNTER — Telehealth: Payer: Self-pay | Admitting: *Deleted

## 2019-06-10 NOTE — Telephone Encounter (Signed)
2 attempts to contact patient .  lmov to change appt .  If patient comes in will reschedule.

## 2019-06-10 NOTE — Progress Notes (Deleted)
Virtual Visit via Video Note   This visit type was conducted due to national recommendations for restrictions regarding the COVID-19 Pandemic (e.g. social distancing) in an effort to limit this patient's exposure and mitigate transmission in our community.  Due to her co-morbid illnesses, this patient is at least at moderate risk for complications without adequate follow up.  This format is felt to be most appropriate for this patient at this time.  All issues noted in this document were discussed and addressed.  A limited physical exam was performed with this format.  Please refer to the patient's chart for her consent to telehealth for [facility].   I connected with  [name] [name] on 06/10/19 by a video enabled telemedicine application and verified that I am speaking with the correct person using two identifiers. I discussed the limitations of evaluation and management by telemedicine. The patient expressed understanding and agreed to proceed.   Evaluation Performed:  Follow-up visit  Date:  06/10/2019   ID:  [name], [name] 03-Feb-1966, MRN [id number]  Patient Location:  [address] [place] [address]   Provider location:   [facility], [place] office  PCP:  McLean-Scocuzza, Nino Glow, MD  Cardiologist:  Patsy [place]   Chief Complaint:  GERD sx   History of Present Illness:    [name] [name] is a 53 y.o. female who presents via audio/video conferencing for a telehealth visit today.   The patient does not symptoms concerning for COVID-19 infection (fever, chills, cough, or new SHORTNESS OF BREATH).   Patient has a past medical history of GERD,  hospital 03/27/2013 with atrial fibrillation with RVR, heart rate 140s Reported this was in the setting of severe stress, at the time had a 60-year-old started on Cardizem, beta blocker with improved heart rate control, converting to normal sinus rhythm  discharged on Cardizem 4 times a day,  metoprolol 25 mg twice a day. She presents today for follow-up of her paroxysmal atrial fibrillation  Trip to ER 12/26/2016 She likely had a panic attack last night and I will prescribe anxiety medicine for her. No more chest pain  Denies atrial fib episodes, Gone through her medications today she is taking metoprolol tartrate 25 mg once a day for unclear reasons Has Jerrye Bushy Takes omeprazole Occasionally has breakthrough Previously seen by GI, told she might have a hiatal hernia  Lab work reviewed with her pain HBA1C 5.4 Total chol 180 nonsmoker  Was previously on metoprolol and flecainide but she stopped flecainide Previously metoprolol was giving her fatigue  Weight continues to trend up, previously was drinking lots of sweet tea  Other past medical history reviewed stopped all of her meds in May 2015,  She was doing well until October 2015, when she started having sx of fluttering, hard heart beats.  Echocardiogram August 2014 showing normal LV systolic function, LV diastolic relaxation abnormality, normal right ventricular systolic pressure TSH in the hospital was normal, 1.9  Dad with CAD, smoker hold   Prior CV studies:   The following studies were reviewed today:    Past Medical History:  Diagnosis Date   Anxiety    Arthritis    B12 deficiency    Chicken pox    Depression    Fibroid    GERD (gastroesophageal reflux disease)    History of hiatal hernia    Hypertension    Iron deficiency anemia    Menorrhagia    Migraines  PAF (paroxysmal atrial fibrillation) ([place])    a. 04/2013 Echo: EF 55-60%, impaired relaxation. Mild MR/TR. Nl RV fxn; b. CHA2DS2VASc = 2; c. PRN flecainide.   Snores    Past Surgical History:  Procedure Laterality Date   ABDOMINAL HYSTERECTOMY     APPENDECTOMY  5/11   C-spine surgery     CESAREAN SECTION  10/98,9/12   COLONOSCOPY WITH PROPOFOL N/A 03/26/2019   Procedure: COLONOSCOPY WITH PROPOFOL;  Surgeon:  Virgel Manifold, MD;  Location: [facility] ENDOSCOPY;  Service: Endoscopy;  Laterality: N/A;   ESOPHAGOGASTRODUODENOSCOPY (EGD) WITH PROPOFOL N/A 03/26/2019   Procedure: ESOPHAGOGASTRODUODENOSCOPY (EGD) WITH PROPOFOL;  Surgeon: Virgel Manifold, MD;  Location: [facility] ENDOSCOPY;  Service: Endoscopy;  Laterality: N/A;   HYSTERECTOMY ABDOMINAL WITH SALPINGO-OOPHORECTOMY Bilateral 08/05/2018   Procedure: HYSTERECTOMY ABDOMINAL WITH BILATERAL SALPINGO-OOPHORECTOMY;  Surgeon: Brayton Mars, MD;  Location: [facility] ORS;  Service: Gynecology;  Laterality: Bilateral;   TONSILLECTOMY  03/2008   TUBAL LIGATION       No outpatient medications have been marked as taking for the 06/11/19 encounter (Appointment) with Minna Merritts, MD.     Allergies:   Phenylmercuric nitrate, Augmentin [amoxicillin-pot clavulanate], Codeine, Diclofenac, Latex, Neomycin, and Percocet [oxycodone-acetaminophen]   Social History   Tobacco Use   Smoking status: Former Smoker    Packs/day: 1.00    Years: 10.00    Pack years: 10.00    Types: Cigarettes    Quit date: 08/22/1991    Years since quitting: 27.8   Smokeless tobacco: Never Used  Substance Use Topics   Alcohol use: Not Currently    Alcohol/week: 0.0 standard drinks    Comment: twice a year    Drug use: Not Currently    Types: Marijuana    Comment: not for a couple years     Current Outpatient Medications on File Prior to Visit  Medication Sig Dispense Refill   aspirin EC 81 MG tablet Take 81 mg by mouth daily.     Cholecalciferol (VITAMIN D3) 125 MCG (5000 UT) CAPS Take 1 capsule (5,000 Units total) by mouth daily. 30 capsule 11   clonazePAM (KLONOPIN) 0.5 MG tablet Take 1 tablet (0.5 mg total) by mouth daily as needed for anxiety. 30 tablet 5   cyanocobalamin (,VITAMIN B-12,) 1000 MCG/ML injection Inject 1 mL (1,000 mcg total) into the muscle every 30 (thirty) days. 10 mL 1   estradiol (CLIMARA - DOSED IN MG/24 HR) 0.05 mg/24hr patch  Place 1 patch (0.05 mg total) onto the skin once a week. 4 patch 12   flecainide (TAMBOCOR) 50 MG tablet Take 50 mg by mouth 2 (two) times daily as needed.     fluticasone (FLONASE) 50 MCG/ACT nasal spray Place 1 spray into both nostrils daily as needed for congestion.  0   ibuprofen (ADVIL,MOTRIN) 600 MG tablet TAKE 1 TABLET BY MOUTH FOUR TIMES DAILY 50 tablet 0   Insulin Syringe-Needle U-100 (INSULIN SYRINGE 1CC/31GX5/16") 31G X 5/16" 1 ML MISC U UTD     lamoTRIgine (LAMICTAL) 200 MG tablet TAKE 1 TABLET BY MOUTH  EVERY NIGHT AT BEDTIME (Patient taking differently: Take 200 mg by mouth daily. ) 90 tablet 1   Multiple Vitamin (MULTIVITAMIN WITH MINERALS) TABS tablet Take 1 tablet by mouth at bedtime.     Oxcarbazepine (TRILEPTAL) 300 MG tablet TAKE 3 TABLETS BY MOUTH  EVERY NIGHT AT BEDTIME 270 tablet 1   pantoprazole (PROTONIX) 40 MG tablet Take 1 tablet (40 mg total) by mouth daily. 30 min before  food 90 tablet 3   progesterone (PROMETRIUM) 200 MG capsule TK 1 C PO QHS     sertraline (ZOLOFT) 100 MG tablet Take 1 tablet (100 mg total) by mouth daily. 90 tablet 1   tiZANidine (ZANAFLEX) 4 MG capsule TK 1 C PO TID PRN     No current facility-administered medications on file prior to visit.      Family Hx: The patient's family history includes Alcohol abuse in her father, maternal aunt, maternal uncle, and paternal grandfather; Arthritis in her father, maternal grandmother, and sister; Diabetes in her mother; Heart disease in her father; Leukemia in her maternal grandfather; Lung cancer in her paternal grandfather; Schizophrenia in her maternal grandmother. There is no history of Breast cancer.  ROS:   Please see the history of present illness.    Review of Systems  Constitutional: Negative.   Respiratory: Negative.   Cardiovascular: Negative.   Gastrointestinal: Negative.   Musculoskeletal: Negative.   Neurological: Negative.   Psychiatric/Behavioral: Negative.   All other  systems reviewed and are negative.    Labs/Other Tests and Data Reviewed:    Recent Labs: 03/07/2019: ALT 14; Hemoglobin 13.9; Platelets 338 05/02/2019: BUN 11; Creatinine, Ser 0.75; Magnesium 2.2; Potassium 4.2; Sodium 140; TSH 1.130   Recent Lipid Panel Lab Results  Component Value Date/Time   CHOL 217 (H) 03/07/2019 12:27 PM   CHOL 173 03/27/2013 05:00 AM   TRIG 126 03/07/2019 12:27 PM   TRIG 84 03/27/2013 05:00 AM   HDL 68 03/07/2019 12:27 PM   HDL 45 03/27/2013 05:00 AM   CHOLHDL 3.2 03/07/2019 12:27 PM   LDLCALC 124 (H) 03/07/2019 12:27 PM   LDLCALC 111 (H) 03/27/2013 05:00 AM    Wt Readings from Last 3 Encounters:  05/28/19 203 lb 6 oz (92.3 kg)  04/30/19 206 lb 12 oz (93.8 kg)  04/10/19 204 lb 9.6 oz (92.8 kg)     Exam:    Vital Signs: Vital signs may also be detailed in the HPI LMP  (LMP Unknown)   Wt Readings from Last 3 Encounters:  05/28/19 203 lb 6 oz (92.3 kg)  04/30/19 206 lb 12 oz (93.8 kg)  04/10/19 204 lb 9.6 oz (92.8 kg)   Temp Readings from Last 3 Encounters:  05/28/19 98.4 F (36.9 C) (Oral)  04/10/19 (!) 95 F (35 C) (Skin)  03/26/19 (!) 96.8 F (36 C) (Tympanic)   BP Readings from Last 3 Encounters:  05/28/19 133/85  04/30/19 120/80  04/10/19 134/81   Pulse Readings from Last 3 Encounters:  05/28/19 69  04/30/19 68  04/10/19 66    120/80, 70s, resp 16  Well nourished, well developed female in no acute distress. Constitutional:  oriented to person, place, and time. No distress.  Head: Normocephalic and atraumatic.  Eyes:  no discharge. No scleral icterus.  Neck: Normal range of motion. Neck supple.  Pulmonary/Chest: No audible wheezing, no distress, appears comfortable Musculoskeletal: Normal range of motion.  no  tenderness or deformity.  Neurological:   Coordination normal. Full exam not performed Skin:  No rash Psychiatric:  normal mood and affect. behavior is normal. Thought content normal.    ASSESSMENT & PLAN:     Paroxysmal atrial fibrillation (HCC) - Plan: EKG 12-Lead Recommend she take metoprolol succinate 25 mg daily Previously was taking metoprolol tartrate 25 mg at nighttime Denies symptoms concerning for atrial fibrillation  Bipolar I disorder, most recent episode depressed ([place]) - Plan: EKG 12-Lead Reports her symptoms are stable Managed by  primary care  Gastroesophageal reflux disease without esophagitis - Plan: EKG 12-Lead Takes omeprazole, occasion with breakthrough symptoms Discussed taking Tums and Pepcid as needed for breakthrough  Chest pain, unspecified type - Plan: EKG 12-Lead Suspect secondary to GERD Recent episode 2018 work-up negative no further episodes since that time She was told it might be a panic attack  Leg swelling - Plan: EKG 12-Lead Likely from weight gain, unable to exclude fluid retention Recommended lifestyle modification for weight loss    COVID-19 Education: The signs and symptoms of COVID-19 were discussed with the patient and how to seek care for testing (follow up with PCP or arrange E-visit).  The importance of social distancing was discussed today.  Patient Risk:   After full review of this patients clinical status, I feel that they are at least moderate risk at this time.  Time:   Today, I have spent 25 minutes with the patient with telehealth technology discussing the cardiac and medical problems/diagnoses detailed above   10 min spent reviewing the chart prior to patient visit today   Medication Adjustments/Labs and Tests Ordered: Current medicines are reviewed at length with the patient today.  Concerns regarding medicines are outlined above.   Tests Ordered: No tests ordered   Medication Changes: No changes made   Disposition: Follow-up in 12 months   Signed, Ida Rogue, MD  06/10/2019 2:44 PM    [place] Office [address] [place] [address], [place], [place] [address]

## 2019-06-10 NOTE — Telephone Encounter (Signed)
Will forward to scheduling to please r/s her follow up appointment out at least a week.

## 2019-06-10 NOTE — Telephone Encounter (Signed)
-----   Message from Britt Bottom, [place] sent at 06/10/2019  9:20 AM EDT ----- [place] hasn't [place]. She will be mailing monitor today to [employer]. She would like to push her appointment out since she hasn't returned her monitor. ----- Message ----- From: Emily Filbert, RN Sent: 06/10/2019   9:13 AM EDT To: Britt Bottom, CMA  Do you mind calling her to see if she mailed it back and if so, when? It is reading as "pending return" in [employer].  ----- Message ----- From: Britt Bottom, CMA Sent: 06/10/2019   8:22 AM EDT To: Rebeca Alert Burl Triage  Pt has upcoming appointment 06/11/2019 @ 9:00 with Dr. Rockey Situ. Pt f/u from [employer] monitor.  Monitor hasn't been uploaded please advise.  Thank you, Lenda Kelp

## 2019-06-11 ENCOUNTER — Telehealth: Payer: Managed Care, Other (non HMO) | Admitting: Cardiovascular Disease

## 2019-06-13 ENCOUNTER — Ambulatory Visit: Payer: 59 | Admitting: Psychiatry

## 2019-06-18 NOTE — Telephone Encounter (Signed)
Incoming fax received from [employer] stating, "it has been 31 days since activation of monitor. If not received by 45 days, I will be marked lost".   Heather RN had conversation with patient 10/20 and pt was aware and would send monitor back that day.   I made call to [employer] to make them aware that pt has mailed monitor back. She made notation in the chart.

## 2019-06-23 NOTE — Progress Notes (Signed)
Virtual Visit via Video Note   This visit type was conducted due to national recommendations for restrictions regarding the COVID-19 Pandemic (e.g. social distancing) in an effort to limit this patient's exposure and mitigate transmission in our community.  Due to her co-morbid illnesses, this patient is at least at moderate risk for complications without adequate follow up.  This format is felt to be most appropriate for this patient at this time.  All issues noted in this document were discussed and addressed.  A limited physical exam was performed with this format.  Please refer to the patient's chart for her consent to telehealth for [facility].   I connected with  [name] [name] on 06/23/19 by a video enabled telemedicine application and verified that I am speaking with the correct person using two identifiers. I discussed the limitations of evaluation and management by telemedicine. The patient expressed understanding and agreed to proceed.   Evaluation Performed:  Follow-up visit  Date:  06/23/2019   ID:  [name], [name] 01/07/1966, MRN [id number]  Patient Location:  [address] [place] [address]   Provider location:   [facility], [place] office  PCP:  McLean-Scocuzza, Nino Glow, MD  Cardiologist:  Patsy [place]  Cc: palpitations   History of Present Illness:    [name] [name] is a 53 y.o. female who presents via audio/video conferencing for a telehealth visit today.   The patient does not symptoms concerning for COVID-19 infection (fever, chills, cough, or new SHORTNESS OF BREATH).   Patient has a past medical history of GERD,  hospital 03/27/2013 with atrial fibrillation with RVR, heart rate 140s Reported this was in the setting of severe stress, at the time had a 76-year-old started on Cardizem, beta blocker with improved heart rate control, converting to normal sinus rhythm  discharged on Cardizem 4 times a day, metoprolol 25 mg  twice a day. 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 No more chest pain  Lab work reviewed with her pain HBA1C 5.4 Total chol 180 nonsmoker  stopped all of her meds in May 2015,  She was doing well until October 2015, when she started having sx of fluttering, hard heart beats.  Echocardiogram August 2014 showing normal LV systolic function, LV diastolic relaxation abnormality, normal right ventricular systolic pressure TSH in the hospital was normal, 1.9  Dad with CAD, smoker hold   Prior CV studies:   The following studies were reviewed today:    Past Medical History:  Diagnosis Date  . Anxiety   . Arthritis   . B12 deficiency   . Chicken pox   . Depression   . Fibroid   . GERD (gastroesophageal reflux disease)   . History of hiatal hernia   . Hypertension   .  Iron deficiency anemia   . Menorrhagia   . Migraines   . PAF (paroxysmal atrial fibrillation) ([place])    a. 04/2013 Echo: EF 55-60%, impaired relaxation. Mild MR/TR. Nl RV fxn; b. CHA2DS2VASc = 2; c. PRN flecainide.  . Snores    Past Surgical History:  Procedure Laterality Date  . ABDOMINAL HYSTERECTOMY    . APPENDECTOMY  5/11  . C-spine surgery    . CESAREAN SECTION  10/98,9/12  . COLONOSCOPY WITH PROPOFOL N/A 03/26/2019   Procedure: COLONOSCOPY WITH PROPOFOL;  Surgeon:  Virgel Manifold, MD;  Location: [facility] ENDOSCOPY;  Service: Endoscopy;  Laterality: N/A;  . ESOPHAGOGASTRODUODENOSCOPY (EGD) WITH PROPOFOL N/A 03/26/2019   Procedure: ESOPHAGOGASTRODUODENOSCOPY (EGD) WITH PROPOFOL;  Surgeon: Virgel Manifold, MD;  Location: [facility] ENDOSCOPY;  Service: Endoscopy;  Laterality: N/A;  . HYSTERECTOMY ABDOMINAL WITH SALPINGO-OOPHORECTOMY Bilateral 08/05/2018   Procedure: HYSTERECTOMY ABDOMINAL WITH BILATERAL SALPINGO-OOPHORECTOMY;  Surgeon: Brayton Mars, MD;  Location: [facility] ORS;  Service: Gynecology;  Laterality: Bilateral;  . TONSILLECTOMY  03/2008  . TUBAL LIGATION       No outpatient medications have been marked as taking for the 06/25/19 encounter (Appointment) with Minna Merritts, MD.     Allergies:   Phenylmercuric nitrate, Augmentin [amoxicillin-pot clavulanate], Codeine, Diclofenac, Latex, Neomycin, and Percocet [oxycodone-acetaminophen]   Social History   Tobacco Use  . Smoking status: Former Smoker    Packs/day: 1.00    Years: 10.00    Pack years: 10.00    Types: Cigarettes    Quit date: 08/22/1991    Years since quitting: 27.8  . Smokeless tobacco: Never Used  Substance Use Topics  . Alcohol use: Not Currently    Alcohol/week: 0.0 standard drinks    Comment: twice a year   . Drug use: Not Currently    Types: Marijuana    Comment: not for a couple years     Current Outpatient Medications on File Prior to Visit  Medication Sig Dispense Refill  . aspirin EC 81 MG tablet Take 81 mg by mouth daily.    . Cholecalciferol (VITAMIN D3) 125 MCG (5000 UT) CAPS Take 1 capsule (5,000 Units total) by mouth daily. 30 capsule 11  . clonazePAM (KLONOPIN) 0.5 MG tablet Take 1 tablet (0.5 mg total) by mouth daily as needed for anxiety. 30 tablet 5  . cyanocobalamin (,VITAMIN B-12,) 1000 MCG/ML injection Inject 1 mL (1,000 mcg total) into the muscle every 30 (thirty) days. 10 mL 1  . estradiol (CLIMARA - DOSED IN MG/24 HR) 0.05 mg/24hr patch Place  1 patch (0.05 mg total) onto the skin once a week. 4 patch 12  . flecainide (TAMBOCOR) 50 MG tablet Take 50 mg by mouth 2 (two) times daily as needed.    . fluticasone (FLONASE) 50 MCG/ACT nasal spray Place 1 spray into both nostrils daily as needed for congestion.  0  . ibuprofen (ADVIL,MOTRIN) 600 MG tablet TAKE 1 TABLET BY MOUTH FOUR TIMES DAILY 50 tablet 0  . Insulin Syringe-Needle U-100 (INSULIN SYRINGE 1CC/31GX5/16") 31G X 5/16" 1 ML MISC U UTD    . lamoTRIgine (LAMICTAL) 200 MG tablet TAKE 1 TABLET BY MOUTH  EVERY NIGHT AT BEDTIME (Patient taking differently: Take 200 mg by mouth daily. ) 90 tablet 1  . Multiple Vitamin (MULTIVITAMIN WITH MINERALS) TABS tablet Take 1 tablet by mouth at bedtime.    . Oxcarbazepine (TRILEPTAL) 300 MG tablet TAKE 3 TABLETS BY MOUTH  EVERY NIGHT AT BEDTIME 270 tablet 1  . pantoprazole (PROTONIX) 40  MG tablet Take 1 tablet (40 mg total) by mouth daily. 30 min before food 90 tablet 3  . progesterone (PROMETRIUM) 200 MG capsule TK 1 C PO QHS    . sertraline (ZOLOFT) 100 MG tablet Take 1 tablet (100 mg total) by mouth daily. 90 tablet 1  . tiZANidine (ZANAFLEX) 4 MG capsule TK 1 C PO TID PRN     No current facility-administered medications on file prior to visit.      Family Hx: The patient's family history includes Alcohol abuse in her father, maternal aunt, maternal uncle, and paternal grandfather; Arthritis in her father, maternal grandmother, and sister; Diabetes in her mother; Heart disease in her father; Leukemia in her maternal grandfather; Lung cancer in her paternal grandfather; Schizophrenia in her maternal grandmother. There is no history of Breast cancer.  ROS:   Please see the history of present illness.    Review of Systems  Constitutional: Negative.   HENT: Negative.   Respiratory: Negative.   Cardiovascular: Positive for palpitations.  Gastrointestinal: Negative.   Musculoskeletal: Negative.   Neurological: Negative.    Psychiatric/Behavioral: Negative.   All other systems reviewed and are negative.    Labs/Other Tests and Data Reviewed:    Recent Labs: 03/07/2019: ALT 14; Hemoglobin 13.9; Platelets 338 05/02/2019: BUN 11; Creatinine, Ser 0.75; Magnesium 2.2; Potassium 4.2; Sodium 140; TSH 1.130   Recent Lipid Panel Lab Results  Component Value Date/Time   CHOL 217 (H) 03/07/2019 12:27 PM   CHOL 173 03/27/2013 05:00 AM   TRIG 126 03/07/2019 12:27 PM   TRIG 84 03/27/2013 05:00 AM   HDL 68 03/07/2019 12:27 PM   HDL 45 03/27/2013 05:00 AM   CHOLHDL 3.2 03/07/2019 12:27 PM   LDLCALC 124 (H) 03/07/2019 12:27 PM   LDLCALC 111 (H) 03/27/2013 05:00 AM    Wt Readings from Last 3 Encounters:  05/28/19 203 lb 6 oz (92.3 kg)  04/30/19 206 lb 12 oz (93.8 kg)  04/10/19 204 lb 9.6 oz (92.8 kg)     Exam:    Vital Signs: Vital signs may also be detailed in the HPI LMP  (LMP Unknown)   Wt Readings from Last 3 Encounters:  05/28/19 203 lb 6 oz (92.3 kg)  04/30/19 206 lb 12 oz (93.8 kg)  04/10/19 204 lb 9.6 oz (92.8 kg)   Temp Readings from Last 3 Encounters:  05/28/19 98.4 F (36.9 C) (Oral)  04/10/19 (!) 95 F (35 C) (Skin)  03/26/19 (!) 96.8 F (36 C) (Tympanic)   BP Readings from Last 3 Encounters:  05/28/19 133/85  04/30/19 120/80  04/10/19 134/81   Pulse Readings from Last 3 Encounters:  05/28/19 69  04/30/19 68  04/10/19 66     Well nourished, well developed female in no acute distress. Constitutional:  oriented to person, place, and time. No distress.    ASSESSMENT & PLAN:    PVCs Seen on monitor, symtoms Worse with stress,  Recommended she discuss with psychiatry to refill her Klonopin PVCs with a bigeminal pattern, since with symptoms more notable on stressful days She can take metoprolol, flecainide, would be nice to have Klonopin as needed for arrhythmia  Paroxysmal atrial fibrillation ([place]) - Plan: EKG 12-Lead Could not tolerate metoprolol secondary to low blood  pressure fatigue side effects Monitor performed off the metoprolol showing no significant atrial fibrillation We will monitor for now  Bipolar I disorder, most recent episode depressed ([name]) - Managed by primary care, needs Klonopin as needed for arrhythmia  Gastroesophageal reflux disease without esophagitis -  On PPI, Tums and Pepcid for breakthrough  Chest pain, unspecified type - Plan: EKG 12-Lead We will need to monitor for anxiety symptoms No further ischemic work-up needed  Leg swelling - Plan: EKG 12-Lead Likely from weight gain, unable to exclude fluid retention Recommended lifestyle modification for weight loss Recommend compression hose    COVID-19 Education: The signs and symptoms of COVID-19 were discussed with the patient and how to seek care for testing (follow up with PCP or arrange E-visit).  The importance of social distancing was discussed today.  Patient Risk:   After full review of this patients clinical status, I feel that they are at least moderate risk at this time.  Time:   Today, I have spent 25 minutes with the patient with telehealth technology discussing the cardiac and medical problems/diagnoses detailed above   10 min spent reviewing the chart prior to patient visit today   Medication Adjustments/Labs and Tests Ordered: Current medicines are reviewed at length with the patient today.  Concerns regarding medicines are outlined above.   Tests Ordered: No tests ordered   Medication Changes: No changes made   Disposition: Follow-up in 12 months   Signed, Ida Rogue, MD  06/23/2019 1:41 PM    [place] Office [address] [place] [address], [place], [place] [address]

## 2019-06-25 ENCOUNTER — Encounter: Payer: Self-pay | Admitting: Cardiovascular Disease

## 2019-06-25 ENCOUNTER — Telehealth (INDEPENDENT_AMBULATORY_CARE_PROVIDER_SITE_OTHER): Payer: Managed Care, Other (non HMO) | Admitting: Cardiovascular Disease

## 2019-06-25 ENCOUNTER — Other Ambulatory Visit: Payer: Self-pay

## 2019-06-25 ENCOUNTER — Telehealth: Payer: Self-pay

## 2019-06-25 ENCOUNTER — Other Ambulatory Visit: Payer: Self-pay | Admitting: Cardiovascular Disease

## 2019-06-25 VITALS — Ht 65.0 in | Wt 198.0 lb

## 2019-06-25 DIAGNOSIS — M7989 Other specified soft tissue disorders: Secondary | ICD-10-CM

## 2019-06-25 DIAGNOSIS — I480 Paroxysmal atrial fibrillation: Secondary | ICD-10-CM

## 2019-06-25 DIAGNOSIS — R079 Chest pain, unspecified: Secondary | ICD-10-CM

## 2019-06-25 DIAGNOSIS — I10 Essential (primary) hypertension: Secondary | ICD-10-CM

## 2019-06-25 DIAGNOSIS — D649 Anemia, unspecified: Secondary | ICD-10-CM

## 2019-06-25 MED ORDER — METOPROLOL TARTRATE 25 MG PO TABS: 25.0000 mg | ORAL_TABLET | Freq: Two times a day (BID) | ORAL | 1 refills | Status: DC | PRN

## 2019-06-25 NOTE — Telephone Encounter (Signed)
Virtual Visit Pre-Appointment Phone Call  "[name] [name], I am calling you today to discuss your upcoming appointment. We are currently trying to limit exposure to the virus that causes COVID-19 by seeing patients at home rather than in the office."  1. "What is the BEST phone number to call the day of the visit?" - include this in appointment notes  2. Do you have or have access to (through a family member/friend) a smartphone with video capability that we can use for your visit?" a. If yes - list this number in appt notes as cell (if different from BEST phone #) and list the appointment type as a VIDEO visit in appointment notes b. If no - list the appointment type as a PHONE visit in appointment notes  3. Confirm consent - "In the setting of the current Covid19 crisis, you are scheduled for a phone visit with your provider on 06/25/2019 at 8:40.  Just as we do with many in-office visits, in order for you to participate in this visit, we must obtain consent.  If you'd like, I can send this to your mychart (if signed up) or email for you to review.  Otherwise, I can obtain your verbal consent now.  All virtual visits are billed to your insurance company just like a normal visit would be.  By agreeing to a virtual visit, we'd like you to understand that the technology does not allow for your provider to perform an examination, and thus may limit your provider's ability to fully assess your condition. If your provider identifies any concerns that need to be evaluated in person, we will make arrangements to do so.  Finally, though the technology is pretty good, we cannot assure that it will always work on either your or our end, and in the setting of a video visit, we may have to convert it to a phone-only visit.  In either situation, we cannot ensure that we have a secure connection.  Are you willing to proceed?" STAFF: Did the patient verbally acknowledge consent to telehealth visit? Document YES/NO  here: YES  4. Advise patient to be prepared - "Two hours prior to your appointment, go ahead and check your blood pressure, pulse, oxygen saturation, and your weight (if you have the equipment to check those) and write them all down. When your visit starts, your provider will ask you for this information. If you have an Apple Watch or Kardia device, please plan to have heart rate information ready on the day of your appointment. Please have a pen and paper handy nearby the day of the visit as well."  5. Give patient instructions for MyChart download to smartphone OR [email or web address] as below if video visit (depending on what platform provider is using)  6. Inform patient they will receive a phone call 15 minutes prior to their appointment time (may be from unknown caller ID) so they should be prepared to answer    TELEPHONE CALL NOTE  [name] [name] has been deemed a candidate for a follow-up tele-health visit to limit community exposure during the Covid-19 pandemic. I spoke with the patient via phone to ensure availability of phone/video source, confirm preferred email & phone number, and discuss instructions and expectations.  I reminded [name] [name] to be prepared with any vital sign and/or heart rhythm information that could potentially be obtained via home monitoring, at the time of her visit. I reminded [name] [name] to expect a phone call prior to her visit.  Rene Paci McClain 06/25/2019 8:41 AM     FULL LENGTH CONSENT FOR TELE-HEALTH VISIT   I hereby voluntarily request, consent and authorize [facility] and its employed or contracted physicians, physician assistants, nurse practitioners or other licensed health care professionals (the Practitioner), to provide me with telemedicine health care services (the Services") as deemed necessary by the treating Practitioner. I acknowledge and consent to receive the Services by the Practitioner via telemedicine. I understand  that the telemedicine visit will involve communicating with the Practitioner through live audiovisual communication technology and the disclosure of certain medical information by electronic transmission. I acknowledge that I have been given the opportunity to request an in-person assessment or other available alternative prior to the telemedicine visit and am voluntarily participating in the telemedicine visit.  I understand that I have the right to withhold or withdraw my consent to the use of telemedicine in the course of my care at any time, without affecting my right to future care or treatment, and that the Practitioner or I may terminate the telemedicine visit at any time. I understand that I have the right to inspect all information obtained and/or recorded in the course of the telemedicine visit and may receive copies of available information for a reasonable fee.  I understand that some of the potential risks of receiving the Services via telemedicine include:   Delay or interruption in medical evaluation due to technological equipment failure or disruption;  Information transmitted may not be sufficient (e.g. poor resolution of images) to allow for appropriate medical decision making by the Practitioner; and/or   In rare instances, security protocols could fail, causing a breach of personal health information.  Furthermore, I acknowledge that it is my responsibility to provide information about my medical history, conditions and care that is complete and accurate to the best of my ability. I acknowledge that Practitioner's advice, recommendations, and/or decision may be based on factors not within their control, such as incomplete or inaccurate data provided by me or distortions of diagnostic images or specimens that may result from electronic transmissions. I understand that the practice of medicine is not an exact science and that Practitioner makes no warranties or guarantees regarding  treatment outcomes. I acknowledge that I will receive a copy of this consent concurrently upon execution via email to the email address I last provided but may also request a printed copy by calling the office of [place].    I understand that my insurance will be billed for this visit.   I have read or had this consent read to me.  I understand the contents of this consent, which adequately explains the benefits and risks of the Services being provided via telemedicine.   I have been provided ample opportunity to ask questions regarding this consent and the Services and have had my questions answered to my satisfaction.  I give my informed consent for the services to be provided through the use of telemedicine in my medical care  By participating in this telemedicine visit I agree to the above.

## 2019-06-25 NOTE — Patient Instructions (Signed)
Medication Instructions:  Stay on metoprolol tartrate 25 mg as needed for breakthrough PVCs If symptoms do not improve take flecainide 50 mg as needed x1   If you need a refill on your cardiac medications before your next appointment, please call your pharmacy.    Lab work: No new labs needed   If you have labs (blood work) drawn today and your tests are completely normal, you will receive your results only by: Marland Kitchen MyChart Message (if you have MyChart) OR . A paper copy in the mail If you have any lab test that is abnormal or we need to change your treatment, we will call you to review the results.   Testing/Procedures: No new testing needed   Follow-Up: At [facility], you and your health needs are our priority.  As part of our continuing mission to provide you with exceptional heart care, we have created designated Provider Care Teams.  These Care Teams include your primary Cardiologist (physician) and Advanced Practice Providers (APPs -  Physician Assistants and Nurse Practitioners) who all work together to provide you with the care you need, when you need it.  . You will need a follow up appointment in 12 months .   Please call our office 2 months in advance to schedule this appointment.    . Providers on your designated Care Team:   . Murray Hodgkins, NP . Christell Faith, PA-C . Marrianne Mood, PA-C  Any Other Special Instructions Will Be Listed Below (If Applicable).  For educational health videos Log in to : [email or web address] Or : [email or web address], password : [email or web address]

## 2019-07-01 ENCOUNTER — Other Ambulatory Visit: Payer: Self-pay | Admitting: Psychiatry

## 2019-07-01 ENCOUNTER — Ambulatory Visit (INDEPENDENT_AMBULATORY_CARE_PROVIDER_SITE_OTHER): Payer: 59 | Admitting: Psychiatry

## 2019-07-01 ENCOUNTER — Encounter: Payer: Self-pay | Admitting: Psychiatry

## 2019-07-01 ENCOUNTER — Other Ambulatory Visit: Payer: Self-pay

## 2019-07-01 DIAGNOSIS — F4310 Post-traumatic stress disorder, unspecified: Secondary | ICD-10-CM | POA: Diagnosis not present

## 2019-07-01 DIAGNOSIS — F4001 Agoraphobia with panic disorder: Secondary | ICD-10-CM

## 2019-07-01 DIAGNOSIS — F319 Bipolar disorder, unspecified: Secondary | ICD-10-CM

## 2019-07-01 DIAGNOSIS — F411 Generalized anxiety disorder: Secondary | ICD-10-CM

## 2019-07-01 DIAGNOSIS — F40240 Claustrophobia: Secondary | ICD-10-CM

## 2019-07-01 DIAGNOSIS — E538 Deficiency of other specified B group vitamins: Secondary | ICD-10-CM

## 2019-07-01 MED ORDER — LORAZEPAM 1 MG PO TABS: 1.0000 mg | ORAL_TABLET | Freq: Three times a day (TID) | ORAL | 0 refills | Status: DC

## 2019-07-01 NOTE — Telephone Encounter (Signed)
Apt today.

## 2019-07-01 NOTE — Telephone Encounter (Signed)
Dosage changed to sertraline 100 mg tablets 1 daily

## 2019-07-01 NOTE — Progress Notes (Signed)
[name] [name] [id number] May 06, 1966 53 y.o.  Subjective:   Patient ID:  [name] [name] is a 53 y.o. (DOB 11-Jun-1966) female.  Chief Complaint:  Chief Complaint  Patient presents with  . Anxiety  . Manic Behavior  . Follow-up    Bipolar disorder anxiety and med changes    Depression        Associated symptoms include no decreased concentration and no suicidal ideas.  Past medical history includes anxiety.   Anxiety Symptoms include nervous/anxious behavior and palpitations. Patient reports no confusion, decreased concentration or suicidal ideas.     [name] [name] presents to the office today for follow-up of several dxes.  At visit  January 2020.  She had recently been diagnosed with low vitamin B12.  She was initiating treatment.  No meds were changed.  Last visit July 2020.  her anxiety was higher after reducing sertraline.  Therefore it was increased back to 100 mg daily.  Cardiologist said she had chronic tx for afib and PVC's and it acts up more when she's under more stress.  Cardiologist suggested Bz when having the episodes.  Better at present but has periods of them daily.  They last an hour or more.  Rarely takes clonazepam, but it helps in 30 mins.  Tolerates clonazepam well otherwise.  She felt increase sertraline helped some with anxiety.  It is better.  More irritable last couple of weeks than anxious unless heart episode.  Missed a couple of weeks of hormones which might have caused this.  Panic attacks from 0-2 weekly is typical but episodes where she may have them daily for a couple of weeks associated with the palpitations as noted.  Patient reports stable mood and denies depressed or irritable moods other than mild fluctuations.  No mania.   More anger and irritability.   Patient denies difficulty with sleep initiation or maintenance. Denies appetite disturbance.  Patient reports that energy and motivation have been good.  She still has complaints of  concentration and forgetfulness as previously noted.  Patient denies any suicidal ideation.  Still forgetful.   Was told she had low b12, still working on getting it up.  Getting shots.  hysterectomy for endometriosis and normally takes a patch distributing hormones though she is recently been out and it worsened her mood when she was not taking it..  Worried about hormones affecting mood.  Hx bad PMS.  Prior psychiatric medication trials include sertraline, Depakote, Saphris, lithium, lamotrigine, risperidone, Trileptal 900, Clonazepam.  Xanax SE.  Review of Systems:  Review of Systems  Constitutional: Negative for fever.  Cardiovascular: Positive for palpitations.  Neurological: Negative for tremors and weakness.  Psychiatric/Behavioral: Positive for agitation and depression. Negative for behavioral problems, confusion, decreased concentration, dysphoric mood, hallucinations, self-injury, sleep disturbance and suicidal ideas. The patient is nervous/anxious. The patient is not hyperactive.     Medications: I have reviewed the patient's current medications.  Current Outpatient Medications  Medication Sig Dispense Refill  . aspirin EC 81 MG tablet Take 81 mg by mouth daily.    . Cholecalciferol (VITAMIN D3) 125 MCG (5000 UT) CAPS Take 1 capsule (5,000 Units total) by mouth daily. 30 capsule 11  . clonazePAM (KLONOPIN) 0.5 MG tablet Take 1 tablet (0.5 mg total) by mouth daily as needed for anxiety. 30 tablet 5  . cyanocobalamin (,VITAMIN B-12,) 1000 MCG/ML injection Inject 1 mL (1,000 mcg total) into the muscle every 30 (thirty) days. 10 mL 1  . estradiol (CLIMARA - DOSED  IN MG/24 HR) 0.05 mg/24hr patch Place 1 patch (0.05 mg total) onto the skin once a week. 4 patch 12  . flecainide (TAMBOCOR) 50 MG tablet Take 50 mg by mouth 2 (two) times daily as needed.    . fluticasone (FLONASE) 50 MCG/ACT nasal spray Place 1 spray into both nostrils daily as needed for congestion.  0  . ibuprofen  (ADVIL,MOTRIN) 600 MG tablet TAKE 1 TABLET BY MOUTH FOUR TIMES DAILY (Patient taking differently: as needed. ) 50 tablet 0  . Insulin Syringe-Needle U-100 (INSULIN SYRINGE 1CC/31GX5/16") 31G X 5/16" 1 ML MISC U UTD    . lamoTRIgine (LAMICTAL) 200 MG tablet TAKE 1 TABLET BY MOUTH  EVERY NIGHT AT BEDTIME (Patient taking differently: Take 200 mg by mouth daily. ) 90 tablet 1  . metoprolol tartrate (LOPRESSOR) 25 MG tablet Take 1 tablet (25 mg total) by mouth 2 (two) times daily as needed. May take an additional tablet for palpitations or tachycardia 60 tablet 1  . Multiple Vitamin (MULTIVITAMIN WITH MINERALS) TABS tablet Take 1 tablet by mouth at bedtime.    . Oxcarbazepine (TRILEPTAL) 300 MG tablet TAKE 3 TABLETS BY MOUTH  EVERY NIGHT AT BEDTIME 270 tablet 1  . pantoprazole (PROTONIX) 40 MG tablet Take 1 tablet (40 mg total) by mouth daily. 30 min before food 90 tablet 3  . progesterone (PROMETRIUM) 200 MG capsule TK 1 C PO QHS    . sertraline (ZOLOFT) 100 MG tablet Take 1 tablet (100 mg total) by mouth daily. 90 tablet 1  . tiZANidine (ZANAFLEX) 4 MG capsule TK 1 C PO TID PRN    . LORazepam (ATIVAN) 1 MG tablet Take 1 tablet (1 mg total) by mouth every 8 (eight) hours. 30 tablet 0   No current facility-administered medications for this visit.     Medication Side Effects: None  Allergies:  Allergies  Allergen Reactions  . Phenylmercuric Nitrate Rash  . Augmentin [Amoxicillin-Pot Clavulanate] Nausea Only    Has patient had a PCN reaction causing immediate rash, facial/tongue/throat swelling, SOB or lightheadedness with hypotension: No Has patient had a PCN reaction causing severe rash involving mucus membranes or skin necrosis: No Has patient had a PCN reaction that required hospitalization: No Has patient had a PCN reaction occurring within the last 10 years: Yes If all of the above answers are "NO", then may proceed with Cephalosporin use.   . Codeine Nausea Only  . Diclofenac Nausea Only   . Latex Rash  . Neomycin Rash  . Percocet [Oxycodone-Acetaminophen] Rash    Hives     Past Medical History:  Diagnosis Date  . Anxiety   . Arthritis   . B12 deficiency   . Chicken pox   . Depression   . Fibroid   . GERD (gastroesophageal reflux disease)   . History of hiatal hernia   . Hypertension   . Iron deficiency anemia   . Menorrhagia   . Migraines   . PAF (paroxysmal atrial fibrillation) ([place])    a. 04/2013 Echo: EF 55-60%, impaired relaxation. Mild MR/TR. Nl RV fxn; b. CHA2DS2VASc = 2; c. PRN flecainide.  . Snores     Family History  Problem Relation Age of Onset  . Diabetes Mother   . Alcohol abuse Father   . Arthritis Father   . Heart disease Father   . Arthritis Sister   . Alcohol abuse Maternal Aunt   . Alcohol abuse Maternal Uncle   . Alcohol abuse Paternal Grandfather   .  Lung cancer Paternal Grandfather        smoker   . Arthritis Maternal Grandmother   . Schizophrenia Maternal Grandmother   . Leukemia Maternal Grandfather   . Breast cancer Neg Hx     Social History   Socioeconomic History  . Marital status: Married    Spouse name: Not on file  . Number of children: Not on file  . Years of education: Not on file  . Highest education level: Not on file  Occupational History  . Not on file  Social Needs  . Financial resource strain: Not on file  . Food insecurity    Worry: Not on file    Inability: Not on file  . Transportation needs    Medical: Not on file    Non-medical: Not on file  Tobacco Use  . Smoking status: Former Smoker    Packs/day: 1.00    Years: 10.00    Pack years: 10.00    Types: Cigarettes    Quit date: 08/22/1991    Years since quitting: 27.8  . Smokeless tobacco: Never Used  Substance and Sexual Activity  . Alcohol use: Not Currently    Alcohol/week: 0.0 standard drinks    Comment: twice a year   . Drug use: Not Currently    Types: Marijuana    Comment: not for a couple years  . Sexual activity: Yes     Partners: Male    Birth control/protection: Surgical    Comment: Husband  Lifestyle  . Physical activity    Days per week: Not on file    Minutes per session: Not on file  . Stress: Not on file  Relationships  . Social Herbalist on phone: Not on file    Gets together: Not on file    Attends religious service: Not on file    Active member of club or organization: Not on file    Attends meetings of clubs or organizations: Not on file    Relationship status: Not on file  . Intimate partner violence    Fear of current or ex partner: Not on file    Emotionally abused: Not on file    Physically abused: Not on file    Forced sexual activity: Not on file  Other Topics Concern  . Not on file  Social History Narrative   [employer]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    Lives with husband and 2 children    Son- 23 yo and daughter 12 yo    Pets: 2 dogs    Caffeine- 2 sodas 20 oz, coffee 1 cup occasionally    Enjoys being outside with family       DPR mom Horton Chin [phone]     Past Medical History, Surgical history, Social history, and Family history were reviewed and updated as appropriate.   Please see review of systems for further details on the patient's review from today.   Objective:   Physical Exam:  LMP  (LMP Unknown)   Physical Exam Constitutional:      General: She is not in acute distress.    Appearance: She is well-developed. She is obese.  Musculoskeletal:        General: No deformity.  Neurological:     Mental Status: She is alert and oriented to person, place, and time.     Motor: No tremor.     Coordination: Coordination normal.     Gait: Gait normal.  Psychiatric:  Attention and Perception: Attention normal.        Mood and Affect: Mood is anxious. Mood is not depressed. Affect is not labile, blunt, angry or inappropriate.        Speech: Speech normal.        Behavior: Behavior normal.        Thought Content: Thought content normal.  Thought content does not include homicidal or suicidal ideation. Thought content does not include homicidal or suicidal plan.        Cognition and Memory: Cognition normal.        Judgment: Judgment normal.     Comments: Insight intact. No auditory or visual hallucinations. No delusions.  Occ irritability.     Lab Review:     Component Value Date/Time   NA 140 05/02/2019 1336   NA 138 11/25/2014 2152   K 4.2 05/02/2019 1336   K 3.7 11/25/2014 2152   CL 102 05/02/2019 1336   CL 105 11/25/2014 2152   CO2 24 05/02/2019 1336   CO2 28 11/25/2014 2152   GLUCOSE 82 05/02/2019 1336   GLUCOSE 108 (H) 12/26/2016 0812   GLUCOSE 98 11/25/2014 2152   BUN 11 05/02/2019 1336   BUN 10 11/25/2014 2152   CREATININE 0.75 05/02/2019 1336   CREATININE 0.81 11/25/2014 2152   CALCIUM 10.3 (H) 05/02/2019 1336   CALCIUM 10.0 11/25/2014 2152   PROT 7.5 03/07/2019 1227   PROT 7.2 11/25/2014 2152   ALBUMIN 4.8 03/07/2019 1227   ALBUMIN 4.1 11/25/2014 2152   AST 19 03/07/2019 1227   AST 15 11/25/2014 2152   ALT 14 03/07/2019 1227   ALT 13 (L) 11/25/2014 2152   ALKPHOS 118 (H) 03/07/2019 1227   ALKPHOS 61 11/25/2014 2152   BILITOT 0.2 03/07/2019 1227   BILITOT 0.4 11/25/2014 2152   GFRNONAA 93 05/02/2019 1336   GFRNONAA >60 11/25/2014 2152   GFRAA 107 05/02/2019 1336   GFRAA >60 11/25/2014 2152       Component Value Date/Time   WBC 6.5 03/07/2019 1227   WBC 4.9 07/30/2018 1033   RBC 4.94 03/07/2019 1227   RBC 4.82 07/30/2018 1033   HGB 13.9 03/07/2019 1227   HCT 40.9 03/07/2019 1227   PLT 338 03/07/2019 1227   MCV 83 03/07/2019 1227   MCV 77 (L) 11/25/2014 2152   MCH 28.1 03/07/2019 1227   MCH 22.4 (L) 07/30/2018 1033   MCHC 34.0 03/07/2019 1227   MCHC 29.8 (L) 07/30/2018 1033   RDW 14.4 03/07/2019 1227   RDW 22.4 (H) 11/25/2014 2152   LYMPHSABS 2.2 03/07/2019 1227   LYMPHSABS 2.4 03/27/2013 0500   MONOABS 0.4 07/30/2018 1033   MONOABS 0.4 03/27/2013 0500   EOSABS 0.3 03/07/2019  1227   EOSABS 0.2 03/27/2013 0500   BASOSABS 0.0 03/07/2019 1227   BASOSABS 0.0 03/27/2013 0500    No results found for: POCLITH, LITHIUM   No results found for: PHENYTOIN, PHENOBARB, VALPROATE, CBMZ   .res Assessment: Plan:    [name] [name] was seen today for anxiety, manic behavior and follow-up.  Diagnoses and all orders for this visit:  Panic disorder with agoraphobia  Claustrophobia  Generalized anxiety disorder  PTSD (post-traumatic stress disorder)  Bipolar I disorder with seasonal pattern ([place])  B12 deficiency  Other orders -     LORazepam (ATIVAN) 1 MG tablet; Take 1 tablet (1 mg total) by mouth every 8 (eight) hours.   Greater than 50% of 30 minutes of non-face to face time with patient was  spent on counseling and coordination of care. We discussed [name] [name] has a history of bipolar disorder with heavy seasonal component .  Her mood is been fairly stable since she is was here last.  She has some mild and occasionally moderate irritability.  Her anxiety is improved since increasing sertraline however she still has episodes that appear to be mild panic attacks associated with palpitations.  This is of real concern because she does have underlying cardiology problems.  Cardiologist has suggested as needed benzo to help reduce her cardiac symptoms.  She uses clonazepam but it takes about 30 minutes to work.  We will switch to lorazepam to see if it works faster.  She was informed to let us know if it does not work.  She also needs to let us know if these episodes become daily again for more than 2 weeks in which case we need to consider adjustment of the SSRI to prevent these episodes.  She previously had exacerbation of anxiety when she attempted to wean off sertraline.  Continue other meds.  Disc mood issues and estrogen and progesterone and risks with and without.  DC clonazepam Start lorazepam 1 mg every 8 hours as needed anxiety attacks.  Call back if it is less effective  than clonazepam or less well-tolerated.  We discussed the short-term risks associated with benzodiazepines including sedation and increased fall risk among others.  Discussed long-term side effect risk including dependence, potential withdrawal symptoms, and the potential eventual dose-related risk of dementia.  This appointment was 30 minutes  FU 6 mos or sooner as needed  Lynder Parents, MD, DFAPA    Please see After Visit Summary for patient specific instructions.  No future appointments.  No orders of the defined types were placed in this encounter.     -------------------------------

## 2019-07-16 ENCOUNTER — Other Ambulatory Visit: Payer: Self-pay | Admitting: Psychiatry

## 2019-07-29 ENCOUNTER — Other Ambulatory Visit: Payer: Self-pay | Admitting: Psychiatry

## 2019-07-30 ENCOUNTER — Other Ambulatory Visit: Payer: Self-pay | Admitting: Psychiatry

## 2019-08-07 ENCOUNTER — Ambulatory Visit: Payer: Managed Care, Other (non HMO) | Admitting: Surgery

## 2019-08-28 ENCOUNTER — Ambulatory Visit: Payer: Managed Care, Other (non HMO) | Admitting: Surgery

## 2019-09-04 ENCOUNTER — Ambulatory Visit: Payer: Managed Care, Other (non HMO) | Admitting: Surgery

## 2019-11-17 ENCOUNTER — Encounter: Payer: Self-pay | Admitting: *Deleted

## 2019-11-28 ENCOUNTER — Other Ambulatory Visit: Payer: Self-pay

## 2019-11-28 ENCOUNTER — Other Ambulatory Visit: Payer: Self-pay | Admitting: Internal Medicine

## 2019-11-28 DIAGNOSIS — F4310 Post-traumatic stress disorder, unspecified: Secondary | ICD-10-CM

## 2019-11-28 DIAGNOSIS — F40240 Claustrophobia: Secondary | ICD-10-CM

## 2019-11-28 DIAGNOSIS — K219 Gastro-esophageal reflux disease without esophagitis: Secondary | ICD-10-CM

## 2019-11-28 DIAGNOSIS — F411 Generalized anxiety disorder: Secondary | ICD-10-CM

## 2019-11-28 DIAGNOSIS — F4001 Agoraphobia with panic disorder: Secondary | ICD-10-CM

## 2019-11-28 MED ORDER — PANTOPRAZOLE SODIUM 40 MG PO TBEC: 40.0000 mg | DELAYED_RELEASE_TABLET | Freq: Every day | ORAL | 3 refills | Status: DC

## 2019-11-28 MED ORDER — SERTRALINE HCL 100 MG PO TABS: 100.0000 mg | ORAL_TABLET | Freq: Every day | ORAL | 1 refills | Status: DC

## 2020-02-18 ENCOUNTER — Other Ambulatory Visit: Payer: Self-pay | Admitting: Psychiatry

## 2020-02-18 DIAGNOSIS — F40240 Claustrophobia: Secondary | ICD-10-CM

## 2020-02-18 DIAGNOSIS — F4310 Post-traumatic stress disorder, unspecified: Secondary | ICD-10-CM

## 2020-02-18 DIAGNOSIS — F4001 Agoraphobia with panic disorder: Secondary | ICD-10-CM

## 2020-02-18 DIAGNOSIS — F411 Generalized anxiety disorder: Secondary | ICD-10-CM

## 2020-03-07 ENCOUNTER — Other Ambulatory Visit: Payer: Self-pay | Admitting: Internal Medicine

## 2020-03-07 DIAGNOSIS — K219 Gastro-esophageal reflux disease without esophagitis: Secondary | ICD-10-CM

## 2020-03-07 MED ORDER — PANTOPRAZOLE SODIUM 40 MG PO TBEC: 40.0000 mg | DELAYED_RELEASE_TABLET | Freq: Every day | ORAL | 1 refills | Status: DC

## 2020-04-06 ENCOUNTER — Ambulatory Visit (INDEPENDENT_AMBULATORY_CARE_PROVIDER_SITE_OTHER): Payer: 59 | Admitting: Psychiatry

## 2020-04-06 ENCOUNTER — Other Ambulatory Visit: Payer: Self-pay

## 2020-04-06 ENCOUNTER — Encounter: Payer: Self-pay | Admitting: Psychiatry

## 2020-04-06 DIAGNOSIS — F411 Generalized anxiety disorder: Secondary | ICD-10-CM | POA: Diagnosis not present

## 2020-04-06 DIAGNOSIS — F4310 Post-traumatic stress disorder, unspecified: Secondary | ICD-10-CM

## 2020-04-06 DIAGNOSIS — F4001 Agoraphobia with panic disorder: Secondary | ICD-10-CM

## 2020-04-06 DIAGNOSIS — F319 Bipolar disorder, unspecified: Secondary | ICD-10-CM

## 2020-04-06 DIAGNOSIS — F40240 Claustrophobia: Secondary | ICD-10-CM | POA: Diagnosis not present

## 2020-04-06 DIAGNOSIS — E538 Deficiency of other specified B group vitamins: Secondary | ICD-10-CM

## 2020-04-06 DIAGNOSIS — R7989 Other specified abnormal findings of blood chemistry: Secondary | ICD-10-CM

## 2020-04-06 DIAGNOSIS — Z5181 Encounter for therapeutic drug level monitoring: Secondary | ICD-10-CM

## 2020-04-06 DIAGNOSIS — R5382 Chronic fatigue, unspecified: Secondary | ICD-10-CM

## 2020-04-06 DIAGNOSIS — Z79899 Other long term (current) drug therapy: Secondary | ICD-10-CM

## 2020-04-06 MED ORDER — SERTRALINE HCL 100 MG PO TABS: 100.0000 mg | ORAL_TABLET | Freq: Every day | ORAL | 1 refills | Status: DC

## 2020-04-06 MED ORDER — LORAZEPAM 1 MG PO TABS: 1.0000 mg | ORAL_TABLET | Freq: Three times a day (TID) | ORAL | 3 refills | Status: DC

## 2020-04-06 MED ORDER — OXCARBAZEPINE 300 MG PO TABS: 900.0000 mg | ORAL_TABLET | Freq: Every day | ORAL | 1 refills | Status: DC

## 2020-04-06 MED ORDER — LAMOTRIGINE 200 MG PO TABS: 200.0000 mg | ORAL_TABLET | Freq: Every day | ORAL | 1 refills | Status: DC

## 2020-04-06 NOTE — Progress Notes (Signed)
[name] [name] [id number] 1966-01-25 54 y.o.  Subjective:   Patient ID:  [name] [name] is a 54 y.o. (DOB 1966/08/01) female.  Chief Complaint:  No chief complaint on file.   Depression        Associated symptoms include fatigue.  Associated symptoms include no decreased concentration and no suicidal ideas.  Past medical history includes anxiety.   Anxiety Symptoms include nervous/anxious behavior and palpitations. Patient reports no confusion, decreased concentration or suicidal ideas.     [name] [name] presents to the office today for follow-up of several dxes.  At visit  January 2020.  She had recently been diagnosed with low vitamin B12.  She was initiating treatment.  No meds were changed.  visit July 2020.  her anxiety was higher after reducing sertraline.  Therefore it was increased back to 100 mg daily.  06/2020 appt with the following noted: Cardiologist said she had chronic tx for afib and PVC's and it acts up more when she's under more stress.  Cardiologist suggested Bz when having the episodes.  Better at present but has periods of them daily.  They last an hour or more.  Rarely takes clonazepam, but it helps in 30 mins.  Tolerates clonazepam well otherwise. She felt increase sertraline helped some with anxiety.  It is better.  More irritable last couple of weeks than anxious unless heart episode.  Missed a couple of weeks of hormones which might have caused this. Panic attacks from 0-2 weekly is typical but episodes where she may have them daily for a couple of weeks associated with the palpitations as noted. Plan: DC clonazepam Start lorazepam 1 mg every 8 hours as needed anxiety attacks.  Call back if it is less effective than clonazepam or less well-tolerated.  04/06/20 appt with the following noted: Real stressed out and feels everything caving in on her.  Work, home and everything.  D broke her leg and ex H had to stay with them. Supervisor stress and  short-staffed.  Doesn't want the vaccine. Only one RX lorazepam.  It helped.  But afraid of addiction.  Tends to isolate weekends bc stress. Consistent with oxcarb 900 mg HS, sertraline 100, lamotrigine 200. Wanting to stay up late and then has EMA.   Not hyper, never.  Is irritable.   ExH's family gives her a hard time and drags D into it.  Patient reports stable mood and denies depressed or irritable moods other than mild fluctuations.  No mania.   More anger and irritability.   Patient denies difficulty with sleep initiation or maintenance. Denies appetite disturbance.  Patient reports that energy and motivation have been good.  She still has complaints of concentration and forgetfulness as previously noted.  Patient denies any suicidal ideation.  Still forgetful.   Was told she had low b12, still working on getting it up.  Getting shots.  hysterectomy for endometriosis and normally takes a patch distributing hormones though she is recently been out and it worsened her mood when she was not taking it..  Worried about hormones affecting mood.  Hx bad PMS.  Prior psychiatric medication trials include sertraline, Depakote, Saphris, lithium, lamotrigine, risperidone, Trileptal 900, Clonazepam.  Xanax SE.  Review of Systems:  Review of Systems  Constitutional: Positive for fatigue. Negative for fever.  Cardiovascular: Positive for palpitations.  Neurological: Negative for tremors and weakness.  Psychiatric/Behavioral: Positive for agitation and depression. Negative for behavioral problems, confusion, decreased concentration, dysphoric mood, hallucinations, self-injury, sleep disturbance and suicidal  ideas. The patient is nervous/anxious. The patient is not hyperactive.     Medications: I have reviewed the patient's current medications.  Current Outpatient Medications  Medication Sig Dispense Refill  . aspirin EC 81 MG tablet Take 81 mg by mouth daily.    . Cholecalciferol (VITAMIN D3) 125  MCG (5000 UT) CAPS Take 1 capsule (5,000 Units total) by mouth daily. 30 capsule 11  . cyanocobalamin (,VITAMIN B-12,) 1000 MCG/ML injection Inject 1 mL (1,000 mcg total) into the muscle every 30 (thirty) days. 10 mL 1  . estradiol (CLIMARA - DOSED IN MG/24 HR) 0.05 mg/24hr patch Place 1 patch (0.05 mg total) onto the skin once a week. 4 patch 12  . flecainide (TAMBOCOR) 50 MG tablet Take 50 mg by mouth 2 (two) times daily as needed.    . fluticasone (FLONASE) 50 MCG/ACT nasal spray Place 1 spray into both nostrils daily as needed for congestion.  0  . ibuprofen (ADVIL,MOTRIN) 600 MG tablet TAKE 1 TABLET BY MOUTH FOUR TIMES DAILY (Patient taking differently: as needed. ) 50 tablet 0  . Insulin Syringe-Needle U-100 (INSULIN SYRINGE 1CC/31GX5/16") 31G X 5/16" 1 ML MISC U UTD    . metoprolol tartrate (LOPRESSOR) 25 MG tablet Take 1 tablet (25 mg total) by mouth 2 (two) times daily as needed. May take an additional tablet for palpitations or tachycardia 60 tablet 1  . Multiple Vitamin (MULTIVITAMIN WITH MINERALS) TABS tablet Take 1 tablet by mouth at bedtime.    . pantoprazole (PROTONIX) 40 MG tablet Take 1 tablet (40 mg total) by mouth daily. 30 min before food. appt for further refills call the clinic for appt 90 tablet 1  . progesterone (PROMETRIUM) 200 MG capsule TK 1 C PO QHS    . tiZANidine (ZANAFLEX) 4 MG capsule TK 1 C PO TID PRN    . lamoTRIgine (LAMICTAL) 200 MG tablet Take 1 tablet (200 mg total) by mouth at bedtime. 90 tablet 1  . LORazepam (ATIVAN) 1 MG tablet Take 1 tablet (1 mg total) by mouth every 8 (eight) hours. 30 tablet 3  . Oxcarbazepine (TRILEPTAL) 300 MG tablet Take 3 tablets (900 mg total) by mouth at bedtime. 270 tablet 1  . sertraline (ZOLOFT) 100 MG tablet Take 1 tablet (100 mg total) by mouth daily. 90 tablet 1   No current facility-administered medications for this visit.    Medication Side Effects: None  Allergies:  Allergies  Allergen Reactions  . Phenylmercuric  Nitrate Rash  . Augmentin [Amoxicillin-Pot Clavulanate] Nausea Only    Has patient had a PCN reaction causing immediate rash, facial/tongue/throat swelling, SOB or lightheadedness with hypotension: No Has patient had a PCN reaction causing severe rash involving mucus membranes or skin necrosis: No Has patient had a PCN reaction that required hospitalization: No Has patient had a PCN reaction occurring within the last 10 years: Yes If all of the above answers are "NO", then may proceed with Cephalosporin use.   . Codeine Nausea Only  . Diclofenac Nausea Only  . Latex Rash  . Neomycin Rash  . Percocet [Oxycodone-Acetaminophen] Rash    Hives     Past Medical History:  Diagnosis Date  . Anxiety   . Arthritis   . B12 deficiency   . Chicken pox   . Depression   . Fibroid   . GERD (gastroesophageal reflux disease)   . History of hiatal hernia   . Hypertension   . Iron deficiency anemia   . Menorrhagia   .  Migraines   . PAF (paroxysmal atrial fibrillation) ([place])    a. 04/2013 Echo: EF 55-60%, impaired relaxation. Mild MR/TR. Nl RV fxn; b. CHA2DS2VASc = 2; c. PRN flecainide.  . Snores     Family History  Problem Relation Age of Onset  . Diabetes Mother   . Alcohol abuse Father   . Arthritis Father   . Heart disease Father   . Arthritis Sister   . Alcohol abuse Maternal Aunt   . Alcohol abuse Maternal Uncle   . Alcohol abuse Paternal Grandfather   . Lung cancer Paternal Grandfather        smoker   . Arthritis Maternal Grandmother   . Schizophrenia Maternal Grandmother   . Leukemia Maternal Grandfather   . Breast cancer Neg Hx     Social History   Socioeconomic History  . Marital status: Married    Spouse name: Not on file  . Number of children: Not on file  . Years of education: Not on file  . Highest education level: Not on file  Occupational History  . Not on file  Tobacco Use  . Smoking status: Former Smoker    Packs/day: 1.00    Years: 10.00    Pack years:  10.00    Types: Cigarettes    Quit date: 08/22/1991    Years since quitting: 28.6  . Smokeless tobacco: Never Used  Vaping Use  . Vaping Use: Never used  Substance and Sexual Activity  . Alcohol use: Not Currently    Alcohol/week: 0.0 standard drinks    Comment: twice a year   . Drug use: Not Currently    Types: Marijuana    Comment: not for a couple years  . Sexual activity: Yes    Partners: Male    Birth control/protection: Surgical    Comment: Husband  Other Topics Concern  . 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 Difficulty of Paying Living Expenses:   Food Insecurity:   . Worried About Charity fundraiser in the Last Year:   . Arboriculturist in the Last Year:   Transportation Needs:   . Film/video editor (Medical):   Marland Kitchen Lack of Transportation (Non-Medical):   Physical Activity:   . Days of Exercise per Week:   . Minutes of Exercise per Session:   Stress:   . Feeling of Stress :   Social Connections:   . Frequency of Communication with Friends and Family:   . Frequency of Social Gatherings with Friends and Family:   . Attends Religious Services:   . Active Member of Clubs or Organizations:   . Attends Archivist Meetings:   Marland Kitchen Marital Status:   Intimate Partner Violence:   . Fear of Current or Ex-Partner:   . Emotionally Abused:   Marland Kitchen Physically Abused:   . Sexually Abused:     Past Medical History, Surgical history, Social history, and Family history were reviewed and updated as appropriate.   Please see review of systems for further  details on the patient's review from today.   Objective:   Physical Exam:  LMP  (LMP  Unknown)   Physical Exam Constitutional:      General: She is not in acute distress.    Appearance: She is well-developed. She is obese.  Musculoskeletal:        General: No deformity.  Neurological:     Mental Status: She is alert and oriented to person, place, and time.     Motor: No tremor.     Coordination: Coordination normal.     Gait: Gait normal.  Psychiatric:        Attention and Perception: Attention normal.        Mood and Affect: Mood is anxious. Mood is not depressed. Affect is not labile, blunt, angry or inappropriate.        Speech: Speech normal. Speech is not slurred.        Behavior: Behavior normal.        Thought Content: Thought content normal. Thought content does not include homicidal or suicidal ideation. Thought content does not include homicidal or suicidal plan.        Cognition and Memory: Cognition normal.        Judgment: Judgment normal.     Comments: Insight intact. No auditory or visual hallucinations. No delusions.  Occ irritability.     Lab Review:     Component Value Date/Time   NA 140 05/02/2019 1336   NA 138 11/25/2014 2152   K 4.2 05/02/2019 1336   K 3.7 11/25/2014 2152   CL 102 05/02/2019 1336   CL 105 11/25/2014 2152   CO2 24 05/02/2019 1336   CO2 28 11/25/2014 2152   GLUCOSE 82 05/02/2019 1336   GLUCOSE 108 (H) 12/26/2016 0812   GLUCOSE 98 11/25/2014 2152   BUN 11 05/02/2019 1336   BUN 10 11/25/2014 2152   CREATININE 0.75 05/02/2019 1336   CREATININE 0.81 11/25/2014 2152   CALCIUM 10.3 (H) 05/02/2019 1336   CALCIUM 10.0 11/25/2014 2152   PROT 7.5 03/07/2019 1227   PROT 7.2 11/25/2014 2152   ALBUMIN 4.8 03/07/2019 1227   ALBUMIN 4.1 11/25/2014 2152   AST 19 03/07/2019 1227   AST 15 11/25/2014 2152   ALT 14 03/07/2019 1227   ALT 13 (L) 11/25/2014 2152   ALKPHOS 118 (H) 03/07/2019 1227   ALKPHOS 61 11/25/2014 2152   BILITOT 0.2 03/07/2019 1227   BILITOT 0.4 11/25/2014 2152   GFRNONAA 93 05/02/2019 1336   GFRNONAA >60  11/25/2014 2152   GFRAA 107 05/02/2019 1336   GFRAA >60 11/25/2014 2152       Component Value Date/Time   WBC 6.5 03/07/2019 1227   WBC 4.9 07/30/2018 1033   RBC 4.94 03/07/2019 1227   RBC 4.82 07/30/2018 1033   HGB 13.9 03/07/2019 1227   HCT 40.9 03/07/2019 1227   PLT 338 03/07/2019 1227   MCV 83 03/07/2019 1227   MCV 77 (L) 11/25/2014 2152   MCH 28.1 03/07/2019 1227   MCH 22.4 (L) 07/30/2018 1033   MCHC 34.0 03/07/2019 1227   MCHC 29.8 (L) 07/30/2018 1033   RDW 14.4 03/07/2019 1227   RDW 22.4 (H) 11/25/2014 2152   LYMPHSABS 2.2 03/07/2019 1227   LYMPHSABS 2.4 03/27/2013 0500   MONOABS 0.4 07/30/2018 1033   MONOABS 0.4 03/27/2013 0500   EOSABS 0.3 03/07/2019 1227   EOSABS 0.2 03/27/2013 0500   BASOSABS 0.0 03/07/2019 1227   BASOSABS 0.0 03/27/2013 0500  No results found for: POCLITH, LITHIUM   No results found for: PHENYTOIN, PHENOBARB, VALPROATE, CBMZ   .res Assessment: Plan:    Diagnoses and all orders for this visit:  Bipolar I disorder with seasonal pattern ([place]) -     Vitamin D 1,25 dihydroxy -     B12 and Folate Panel -     lamoTRIgine (LAMICTAL) 200 MG tablet; Take 1 tablet (200 mg total) by mouth at bedtime. -     Oxcarbazepine (TRILEPTAL) 300 MG tablet; Take 3 tablets (900 mg total) by mouth at bedtime.  Panic disorder with agoraphobia -     LORazepam (ATIVAN) 1 MG tablet; Take 1 tablet (1 mg total) by mouth every 8 (eight) hours. -     sertraline (ZOLOFT) 100 MG tablet; Take 1 tablet (100 mg total) by mouth daily.  Claustrophobia -     sertraline (ZOLOFT) 100 MG tablet; Take 1 tablet (100 mg total) by mouth daily.  Generalized anxiety disorder -     sertraline (ZOLOFT) 100 MG tablet; Take 1 tablet (100 mg total) by mouth daily.  PTSD (post-traumatic stress disorder) -     sertraline (ZOLOFT) 100 MG tablet; Take 1 tablet (100 mg total) by mouth daily.  B12 deficiency -     B12 and Folate Panel  Low vitamin D level -     Vitamin D 1,25  dihydroxy  Chronic fatigue -     Vitamin D 1,25 dihydroxy -     B12 and Folate Panel  Encounter for monitoring anticonvulsant therapy -     B12 and Folate Panel   Greater than 50% of 30 minutes of non-face to face time with patient was spent on counseling and coordination of care. We discussed [name] [name] has a history of bipolar disorder with heavy seasonal component .  Her mood is been fairly stable since she is was here last.  She has some mild and occasionally moderate irritability.  Her anxiety is improved since increasing sertraline however she still has episodes that appear to be mild panic attacks associated with palpitations.  This is of real concern because she does have underlying cardiology problems.  Cardiologist has suggested as needed benzo to help reduce her cardiac symptoms.  She uses clonazepam but it takes about 30 minutes to work.  We will switch to lorazepam to see if it works faster.  She was informed to let us know if it does not work.  She also needs to let us know if these episodes become daily again for more than 2 weeks in which case we need to consider adjustment of the SSRI to prevent these episodes.  She previously had exacerbation of anxiety when she attempted to wean off sertraline.  Continue other meds.  Disc fears of vaccine and encouraged it.  Disc mood issues and estrogen and progesterone and risks with and without.   Low B12 and vitamin D can contribute to psych problems and can be consequence of AEDs.  We discussed the short-term risks associated with benzodiazepines including sedation and increased fall risk among others.  Discussed long-term side effect risk including dependence, potential withdrawal symptoms, and the potential eventual dose-related risk of dementia.  Disc risk dependence in detail in terms of the amount that can be used without it.  This appointment was 30 minutes  FU 6 mos or sooner as needed  Lynder Parents, MD, DFAPA    Please see  After Visit Summary for patient specific instructions.  Future Appointments  Date  Time Provider [place]  04/09/2020 11:00 AM Loel Dubonnet, NP [facility]  04/23/2020 10:00 AM Venita Lick, NP [facility]  05/27/2020  3:30 PM Cottle, Billey Co., MD [facility] None    Orders Placed This Encounter  Procedures  . Vitamin D 1,25 dihydroxy  . B12 and Folate Panel      -------------------------------

## 2020-04-09 ENCOUNTER — Ambulatory Visit: Payer: Managed Care, Other (non HMO) | Admitting: Family

## 2020-04-16 ENCOUNTER — Ambulatory Visit: Payer: Managed Care, Other (non HMO) | Admitting: Family

## 2020-04-16 LAB — VITAMIN D 1,25 DIHYDROXY
Vitamin D 1, 25 (OH)2 Total: 75 pg/mL — ABNORMAL HIGH
Vitamin D2 1, 25 (OH)2: 10 pg/mL
Vitamin D3 1, 25 (OH)2: 65 pg/mL

## 2020-04-16 LAB — B12 AND FOLATE PANEL
Folate: 9.9 ng/mL (ref >3.0)
Vitamin B-12: 175 pg/mL — ABNORMAL LOW (ref 232–1245)

## 2020-04-19 NOTE — Progress Notes (Signed)
Pt. Made aware. She stated you used to call in the vials and she would give herself the B 12 shot. Can you call these in? If not can you send in an Rx for B12?

## 2020-04-23 ENCOUNTER — Ambulatory Visit: Payer: Managed Care, Other (non HMO) | Admitting: Nurse Practitioner

## 2020-05-04 ENCOUNTER — Telehealth: Payer: Self-pay

## 2020-05-04 NOTE — Telephone Encounter (Signed)
Pt called this morning requesting an office appt with you today due to pain in her abdomen for 3 to 4 days, rectal bleeding that fills the toilet, dark stools, diarrhea, nausea and vomiting with a slight fever yesterday. I advised pt she may need to be evaluated at the ER due to the severity of her symptoms but refuses. Please advise me or Maritiza of your plan. Looks like you referred her to Dr. Marius Ditch for hemorrhoid banding but left before being seen.

## 2020-05-06 NOTE — Telephone Encounter (Signed)
Virgel Manifold, MD  You; Ellison Hughs, Ginger, CMA 20 hours ago (2:30 PM)   She has not seen [place] in over a year. Her symptoms are acute and she likely needs stat labs and other workup. She should go to the ER      Called patient back to let her know what Dr. Michele Mcalpine recommendations were for her to be evaluated by the ED due to the severity of her symptoms and not been seen about a year ago at our office. Patient agreed with recommendation.

## 2020-05-11 ENCOUNTER — Ambulatory Visit: Payer: Managed Care, Other (non HMO) | Admitting: Nurse Practitioner

## 2020-05-24 ENCOUNTER — Other Ambulatory Visit: Payer: Self-pay

## 2020-05-24 ENCOUNTER — Encounter: Payer: Self-pay | Admitting: Nurse Practitioner

## 2020-05-24 ENCOUNTER — Ambulatory Visit (INDEPENDENT_AMBULATORY_CARE_PROVIDER_SITE_OTHER): Payer: Managed Care, Other (non HMO) | Admitting: Nurse Practitioner

## 2020-05-24 VITALS — BP 113/71 | HR 74 | Temp 98.4°F | Ht 65.0 in | Wt 206.0 lb

## 2020-05-24 DIAGNOSIS — I480 Paroxysmal atrial fibrillation: Secondary | ICD-10-CM

## 2020-05-24 DIAGNOSIS — R1084 Generalized abdominal pain: Secondary | ICD-10-CM | POA: Insufficient documentation

## 2020-05-24 DIAGNOSIS — F4310 Post-traumatic stress disorder, unspecified: Secondary | ICD-10-CM

## 2020-05-24 DIAGNOSIS — D649 Anemia, unspecified: Secondary | ICD-10-CM

## 2020-05-24 DIAGNOSIS — E559 Vitamin D deficiency, unspecified: Secondary | ICD-10-CM

## 2020-05-24 DIAGNOSIS — F3130 Bipolar disorder, current episode depressed, mild or moderate severity, unspecified: Secondary | ICD-10-CM | POA: Diagnosis not present

## 2020-05-24 DIAGNOSIS — E538 Deficiency of other specified B group vitamins: Secondary | ICD-10-CM

## 2020-05-24 DIAGNOSIS — K219 Gastro-esophageal reflux disease without esophagitis: Secondary | ICD-10-CM

## 2020-05-24 MED ORDER — CHOLECALCIFEROL 1.25 MG (50000 UT) PO TABS: 1.0000 | ORAL_TABLET | ORAL | 0 refills | Status: DC

## 2020-05-24 NOTE — Patient Instructions (Signed)
Start oral Vitamin B12 1000 MCG daily and start taking Vitamin D as per prescription taken in.  Vitamin B12 Deficiency Vitamin B12 deficiency occurs when the body does not have enough vitamin B12, which is an important vitamin. The body needs this vitamin:  To make red blood cells.  To make DNA. This is the genetic material inside cells.  To help the nerves work properly so they can carry messages from the brain to the body. Vitamin B12 deficiency can cause various health problems, such as a low red blood cell count (anemia) or nerve damage. What are the causes? This condition may be caused by:  Not eating enough foods that contain vitamin B12.  Not having enough stomach acid and digestive fluids to properly absorb vitamin B12 from the food that you eat.  Certain digestive system diseases that make it hard to absorb vitamin B12. These diseases include Crohn's disease, chronic pancreatitis, and cystic fibrosis.  A condition in which the body does not make enough of a protein (intrinsic factor), resulting in too few red blood cells (pernicious anemia).  Having a surgery in which part of the stomach or small intestine is removed.  Taking certain medicines that make it hard for the body to absorb vitamin B12. These medicines include: ? Heartburn medicines (antacids and proton pump inhibitors). ? Certain antibiotic medicines. ? Some medicines that are used to treat diabetes, tuberculosis, gout, or high cholesterol. What increases the risk? The following factors may make you more likely to develop a B12 deficiency:  Being older than age 36.  Eating a vegetarian or vegan diet, especially while you are pregnant.  Eating a poor diet while you are pregnant.  Taking certain medicines.  Having alcoholism. What are the signs or symptoms? In some cases, there are no symptoms of this condition. If the condition leads to anemia or nerve damage, various symptoms can occur, such  as:  Weakness.  Fatigue.  Loss of appetite.  Weight loss.  Numbness or tingling in your hands and feet.  Redness and burning of the tongue.  Confusion or memory problems.  Depression.  Sensory problems, such as color blindness, ringing in the ears, or loss of taste.  Diarrhea or constipation.  Trouble walking. If anemia is severe, symptoms can include:  Shortness of breath.  Dizziness.  Rapid heart rate (tachycardia). How is this diagnosed? This condition may be diagnosed with a blood test to measure the level of vitamin B12 in your blood. You may also have other tests, including:  A group of tests that measure certain characteristics of blood cells (complete blood count, CBC).  A blood test to measure intrinsic factor.  A procedure where a thin tube with a camera on the end is used to look into your stomach or intestines (endoscopy). Other tests may be needed to discover the cause of B12 deficiency. How is this treated? Treatment for this condition depends on the cause. This condition may be treated by:  Changing your eating and drinking habits, such as: ? Eating more foods that contain vitamin B12. ? Drinking less alcohol or no alcohol.  Getting vitamin B12 injections.  Taking vitamin B12 supplements. Your health care provider will tell you which dosage is best for you. Follow these instructions at home: Eating and drinking   Eat lots of healthy foods that contain vitamin B12, including: ? Meats and poultry. This includes beef, pork, chicken, [place], and organ meats, such as liver. ? Seafood. This includes clams, rainbow trout, salmon,  tuna, and haddock. ? Eggs. ? Cereal and dairy products that are fortified. This means that vitamin B12 has been added to the food. Check the label on the package to see if the food is fortified. The items listed above may not be a complete list of recommended foods and beverages. Contact a dietitian for more  information. General instructions  Get any injections that are prescribed by your health care provider.  Take supplements only as told by your health care provider. Follow the directions carefully.  Do not drink alcohol if your health care provider tells you not to. In some cases, you may only be asked to limit alcohol use.  Keep all follow-up visits as told by your health care provider. This is important. Contact a health care provider if:  Your symptoms come back. Get help right away if you:  Develop shortness of breath.  Have a rapid heart rate.  Have chest pain.  Become dizzy or lose consciousness. Summary  Vitamin B12 deficiency occurs when the body does not have enough vitamin B12.  The main causes of vitamin B12 deficiency include dietary deficiency, digestive diseases, pernicious anemia, and having a surgery in which part of the stomach or small intestine is removed.  In some cases, there are no symptoms of this condition. If the condition leads to anemia or nerve damage, various symptoms can occur, such as weakness, shortness of breath, and numbness.  Treatment may include getting vitamin B12 injections or taking vitamin B12 supplements. Eat lots of healthy foods that contain vitamin B12. This information is not intended to replace advice given to you by your health care provider. Make sure you discuss any questions you have with your health care provider. Document Revised: 01/24/2019 Document Reviewed: 04/16/2018 Elsevier Patient Education  2020 [employer].

## 2020-05-24 NOTE — Assessment & Plan Note (Signed)
Chronic, ongoing.  Followed by psychiatry in [place].  Continue current medication regimen as prescribed by them.

## 2020-05-24 NOTE — Assessment & Plan Note (Signed)
Continue to collaborate with GI.  Recommend she schedule follow-up with them.  CBC, CMP, TSH, iron, and stool sample ordered today.

## 2020-05-24 NOTE — Assessment & Plan Note (Signed)
Chronic, ongoing.  Recommend she start Vitamin D 1000 units daily and will plan to recheck level next visit.

## 2020-05-24 NOTE — Assessment & Plan Note (Signed)
Chronic, stable.  Followed by cardiology.  Continue current medication regimen as prescribed by them and adjust as needed.

## 2020-05-24 NOTE — Assessment & Plan Note (Signed)
Chronic, ongoing with poor response to Protonix.  Continue to collaborate with GI team, recommend she schedule to see them as soon as possible.

## 2020-05-24 NOTE — Assessment & Plan Note (Signed)
No current iron supplements, to take B12 daily.  Check CBC and iron, ferritin, TIBC today.

## 2020-05-24 NOTE — Assessment & Plan Note (Signed)
Chronic, ongoing.  Followed by psychiatry in [place].  Continue current medication regimen as prescribed by them.  Denies SI/HI.  Will place referral to therapy, as she would like to start attending sessions.

## 2020-05-24 NOTE — Progress Notes (Signed)
New Patient Office Visit  Subjective:  [name], female    DOB: 09-Feb-1966  Age: 54 y.o. MRN: [id number]  CC:  Chief Complaint  Patient presents with  . New Patient (Initial Visit)    HPI [name] [name] presents for new patient visit to establish care.  Introduced to Designer, jewellery role and practice setting.  All questions answered.  Discussed provider/patient relationship and expectations.  ABDOMINAL PAIN Followed by [facility], last October 2020 -- attempted to get appointment recently but was unable to get appointment prior to new NP visit today.  Had colonoscopy and endoscopy in August 2020 -- to repeat colonoscopy in 2025.  Noted anal papilla were hypertrophied.  States she has a hiatal hernia and underlying IBS.  Struggles with GERD and took Protonix, but reports little benefit with this .  Reports she has had a deep aching pain, cramping feeling across lower abdomen which has been present a long while and then last week started having diarrhea and nausea with this.  Has been passing bright red blood in stool two weeks ago, noted when wiping, and then this improved last week + had pain in rectal area when this presented, now improved.  Recent August labs noted a B12 of 175 and Vitamin D 26.1.  Not consistently taking supplements. Duration:weeks Onset: gradual Severity: 6/10 Quality: dull, aching and cramping Location:  diffuse  Episode duration:  Radiation: no Frequency: intermittent Alleviating factors:  Aggravating factors: Status: fluctuating Treatments attempted: PPI Fever: no Nausea: yes Vomiting: yes Weight loss: no Decreased appetite: no Diarrhea: flucuates Constipation: fluctuates Blood in stool: bright red recently, improved one week ago Heartburn: yes Jaundice: no Rash: no Dysuria/urinary frequency: no Hematuria: no History of sexually transmitted disease: no Recurrent NSAID use: no   ATRIAL FIBRILLATION  Saw cardiology last  06/25/2019 with a normal EKG at that time.  Has tried Metoprolol in past with side effects.  Currently taking Tambocor and ASA. Atrial fibrillation status: stable Satisfied with current treatment: yes  Medication side effects:  no Medication compliance: good compliance Etiology of atrial fibrillation: unknown Palpitations:  no Chest pain:  no Dyspnea on exertion:  no Orthopnea:  no Syncope:  no Edema:  no Ventricular rate control: Tambocor Anti-coagulation: aspirin  BIPOLAR DISORDER/PTSD Currently followed by Dr. Curt Jews at [facility] in [place] - last seen 04/06/20 and sees every 6 months.  Currently taking Lamictal, Sertraline, Trileptal, and Ativan. Mood status: stable Satisfied with current treatment?: yes Symptom severity: moderate  Duration of current treatment : chronic Side effects: no Medication compliance: good compliance Psychotherapy/counseling: yes in the past Previous psychiatric medications: multiple medications in past Depressed mood: yes Anxious mood: no Anhedonia: no Significant weight loss or gain: no Insomnia: none Fatigue: no Feelings of worthlessness or guilt: no Impaired concentration/indecisiveness: no Suicidal ideations: no Hopelessness: no Crying spells: no Depression screen [facility] 2/9 05/24/2020 12/10/2014  Decreased Interest 2 0  Down, Depressed, Hopeless 0 0  PHQ - 2 Score 2 0  Altered sleeping 0 -  Tired, decreased energy 3 -  Change in appetite 3 -  Feeling bad or failure about yourself  0 -  Trouble concentrating 3 -  Moving slowly or fidgety/restless 0 -  Suicidal thoughts 0 -  PHQ-9 Score 11 -  Difficult doing work/chores Somewhat difficult -    Past Medical History:  Diagnosis Date  . Anxiety   . Arthritis   . B12 deficiency   . Chicken pox   . Depression   .  Fibroid   . GERD (gastroesophageal reflux disease)   . History of hiatal hernia   . Hypertension   . Iron deficiency anemia   . Menorrhagia   . Migraines   . PAF (paroxysmal  atrial fibrillation) ([place])    a. 04/2013 Echo: EF 55-60%, impaired relaxation. Mild MR/TR. Nl RV fxn; b. CHA2DS2VASc = 2; c. PRN flecainide.  . Snores     Past Surgical History:  Procedure Laterality Date  . ABDOMINAL HYSTERECTOMY    . APPENDECTOMY  5/11  . C-spine surgery    . CESAREAN SECTION  10/98,9/12  . COLONOSCOPY WITH PROPOFOL N/A 03/26/2019   Procedure: COLONOSCOPY WITH PROPOFOL;  Surgeon: Virgel Manifold, MD;  Location: [facility] ENDOSCOPY;  Service: Endoscopy;  Laterality: N/A;  . ESOPHAGOGASTRODUODENOSCOPY (EGD) WITH PROPOFOL N/A 03/26/2019   Procedure: ESOPHAGOGASTRODUODENOSCOPY (EGD) WITH PROPOFOL;  Surgeon: Virgel Manifold, MD;  Location: [facility] ENDOSCOPY;  Service: Endoscopy;  Laterality: N/A;  . HYSTERECTOMY ABDOMINAL WITH SALPINGO-OOPHORECTOMY Bilateral 08/05/2018   Procedure: HYSTERECTOMY ABDOMINAL WITH BILATERAL SALPINGO-OOPHORECTOMY;  Surgeon: Brayton Mars, MD;  Location: [facility] ORS;  Service: Gynecology;  Laterality: Bilateral;  . TONSILLECTOMY  03/2008  . TUBAL LIGATION      Family History  Problem Relation Age of Onset  . Diabetes Mother   . Alcohol abuse Father   . Arthritis Father   . Heart disease Father   . Arthritis Sister   . Alcohol abuse Maternal Aunt   . Alcohol abuse Maternal Uncle   . Alcohol abuse Paternal Grandfather   . Lung cancer Paternal Grandfather        smoker   . Arthritis Maternal Grandmother   . Schizophrenia Maternal Grandmother   . Leukemia Maternal Grandfather   . Breast cancer Neg Hx     Social History   Socioeconomic History  . Marital status: Married    Spouse name: Not on file  . Number of children: Not on file  . Years of education: Not on file  . Highest education level: Not on file  Occupational History  . Not on file  Tobacco Use  . Smoking status: Former Smoker    Packs/day: 1.00    Years: 10.00    Pack years: 10.00    Types: Cigarettes    Quit date: 08/22/1991    Years since quitting: 28.7  .  Smokeless tobacco: Never Used  Vaping Use  . Vaping Use: Never used  Substance and Sexual Activity  . Alcohol use: Not Currently    Alcohol/week: 0.0 standard drinks    Comment: twice a year   . Drug use: Not Currently    Types: Marijuana    Comment: not for a couple years  . Sexual activity: Yes    Partners: Male    Birth control/protection: Surgical    Comment: Husband  Other Topics Concern  . Not on file  Social History Narrative   [employer]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    Lives with husband and 2 children    Son- 14 yo and daughter 30 yo    Pets: 2 dogs    Caffeine- 2 sodas 20 oz, coffee 1 cup occasionally    Enjoys being outside with family       DPR mom Horton Chin [phone]    Social Determinants of Health   Financial Resource Strain:   . Difficulty of Paying Living Expenses: Not on file  Food Insecurity:   . Worried About Charity fundraiser in the  Last Year: Not on file  . Ran Out of Food in the Last Year: Not on file  Transportation Needs:   . Lack of Transportation (Medical): Not on file  . Lack of Transportation (Non-Medical): Not on file  Physical Activity:   . Days of Exercise per Week: Not on file  . Minutes of Exercise per Session: Not on file  Stress:   . Feeling of Stress : Not on file  Social Connections:   . Frequency of Communication with Friends and Family: Not on file  . Frequency of Social Gatherings with Friends and Family: Not on file  . Attends Religious Services: Not on file  . Active Member of Clubs or Organizations: Not on file  . Attends Archivist Meetings: Not on file  . Marital Status: Not on file  Intimate Partner Violence:   . Fear of Current or Ex-Partner: Not on file  . Emotionally Abused: Not on file  . Physically Abused: Not on file  . Sexually Abused: Not on file    ROS Review of Systems  Constitutional: Negative for activity change, appetite change, diaphoresis, fatigue and fever.  Respiratory:  Negative for cough, chest tightness and shortness of breath.   Cardiovascular: Negative for chest pain, palpitations and leg swelling.  Gastrointestinal: Positive for abdominal pain, blood in stool and nausea. Negative for abdominal distention, constipation, diarrhea and vomiting.  Endocrine: Negative for cold intolerance and heat intolerance.  Neurological: Negative.   Psychiatric/Behavioral: Negative.     Objective:   Today's Vitals: BP 113/71 (BP Location: Left Arm, Patient Position: Sitting, Cuff Size: Normal)   Pulse 74   Temp 98.4 F (36.9 C) (Oral)   Ht 5\' 5"  (1.651 m)   Wt 206 lb (93.4 kg)   LMP  (LMP Unknown)   SpO2 98%   BMI 34.28 kg/m   Physical Exam Vitals and nursing note reviewed.  Constitutional:      General: She is awake. She is not in acute distress.    Appearance: She is well-developed and well-groomed. She is obese. She is not ill-appearing.  HENT:     Head: Normocephalic.     Right Ear: Hearing normal.     Left Ear: Hearing normal.     Nose: Nose normal.  Eyes:     General: Lids are normal.        Right eye: No discharge.        Left eye: No discharge.     Conjunctiva/sclera: Conjunctivae normal.     Pupils: Pupils are equal, round, and reactive to light.  Neck:     Thyroid: No thyromegaly.     Vascular: No carotid bruit.  Cardiovascular:     Rate and Rhythm: Normal rate and regular rhythm.     Heart sounds: Normal heart sounds. No murmur heard.  No gallop.   Pulmonary:     Effort: Pulmonary effort is normal. No accessory muscle usage or respiratory distress.     Breath sounds: Normal breath sounds.  Abdominal:     General: Bowel sounds are normal.     Palpations: Abdomen is soft. There is no hepatomegaly or splenomegaly.     Tenderness: There is no abdominal tenderness. There is no right CVA tenderness or left CVA tenderness.     Hernia: No hernia is present.     Comments: Deferred rectal exam at request  Musculoskeletal:     Cervical back:  Normal range of motion and neck supple.     Right  lower leg: No edema.     Left lower leg: No edema.  Lymphadenopathy:     Cervical: No cervical adenopathy.  Skin:    General: Skin is warm and dry.  Neurological:     Mental Status: She is alert and oriented to person, place, and time.  Psychiatric:        Attention and Perception: Attention normal.        Mood and Affect: Mood normal.        Speech: Speech normal.        Behavior: Behavior normal. Behavior is cooperative.        Thought Content: Thought content normal.     Assessment & Plan:   Problem List Items Addressed This Visit      Cardiovascular and Mediastinum   PAF (paroxysmal atrial fibrillation) (HCC)    Chronic, stable.  Followed by cardiology.  Continue current medication regimen as prescribed by them and adjust as needed.          Digestive   GERD (gastroesophageal reflux disease)    Chronic, ongoing with poor response to Protonix.  Continue to collaborate with GI team, recommend she schedule to see them as soon as possible.        Other   Bipolar I disorder, most recent episode depressed ([place]) - Primary    Chronic, ongoing.  Followed by psychiatry in [place].  Continue current medication regimen as prescribed by them.  Denies SI/HI.  Will place referral to therapy, as she would like to start attending sessions.      Relevant Orders   Ambulatory referral to Psychology   Anemia    No current iron supplements, to take B12 daily.  Check CBC and iron, ferritin, TIBC today.      PTSD (post-traumatic stress disorder)    Chronic, ongoing.  Followed by psychiatry in [place].  Continue current medication regimen as prescribed by them.      Relevant Orders   Ambulatory referral to Psychology   B12 deficiency    Noted on recent labs.  Recommend she take Vitamin B12 1000 MCG daily, will plan to recheck level in 6 weeks.  If poor benefit from oral will consider IM injections.  Could be underlying cause for  her fatigue.      Vitamin D deficiency    Chronic, ongoing.  Recommend she start Vitamin D 1000 units daily and will plan to recheck level next visit.      Generalized abdominal pain    Continue to collaborate with GI.  Recommend she schedule follow-up with them.  CBC, CMP, TSH, iron, and stool sample ordered today.      Relevant Orders   CBC with Differential/Platelet   Comprehensive metabolic panel   TSH   Iron, TIBC and Ferritin Panel   Fecal occult blood, imunochemical      Outpatient Encounter Medications as of 05/24/2020  Medication Sig  . aspirin EC 81 MG tablet Take 81 mg by mouth daily.  . cyanocobalamin (,VITAMIN B-12,) 1000 MCG/ML injection Inject 1 mL (1,000 mcg total) into the muscle every 30 (thirty) days.  . flecainide (TAMBOCOR) 50 MG tablet Take 50 mg by mouth 2 (two) times daily as needed.  . fluticasone (FLONASE) 50 MCG/ACT nasal spray Place 1 spray into both nostrils daily as needed for congestion.  Marland Kitchen ibuprofen (ADVIL,MOTRIN) 600 MG tablet TAKE 1 TABLET BY MOUTH FOUR TIMES DAILY (Patient taking differently: as needed. )  . Insulin Syringe-Needle U-100 (INSULIN SYRINGE 1CC/31GX5/16") 31G  X 5/16" 1 ML MISC U UTD  . lamoTRIgine (LAMICTAL) 200 MG tablet Take 1 tablet (200 mg total) by mouth at bedtime.  Marland Kitchen LORazepam (ATIVAN) 1 MG tablet Take 1 tablet (1 mg total) by mouth every 8 (eight) hours.  . metoprolol tartrate (LOPRESSOR) 25 MG tablet Take 1 tablet (25 mg total) by mouth 2 (two) times daily as needed. May take an additional tablet for palpitations or tachycardia  . Multiple Vitamin (MULTIVITAMIN WITH MINERALS) TABS tablet Take 1 tablet by mouth at bedtime.  . Oxcarbazepine (TRILEPTAL) 300 MG tablet Take 3 tablets (900 mg total) by mouth at bedtime.  . sertraline (ZOLOFT) 100 MG tablet Take 1 tablet (100 mg total) by mouth daily.  . [DISCONTINUED] Cholecalciferol (VITAMIN D3) 125 MCG (5000 UT) CAPS Take 1 capsule (5,000 Units total) by mouth daily.  .  Cholecalciferol 1.25 MG (50000 UT) TABS Take 1 tablet by mouth once a week. For 8 weeks and then stop.  Return to office for lab draw.  . estradiol (CLIMARA - DOSED IN MG/24 HR) 0.05 mg/24hr patch Place 1 patch (0.05 mg total) onto the skin once a week. (Patient not taking: Reported on 05/24/2020)  . pantoprazole (PROTONIX) 40 MG tablet Take 1 tablet (40 mg total) by mouth daily. 30 min before food. appt for further refills call the clinic for appt (Patient not taking: Reported on 05/24/2020)  . progesterone (PROMETRIUM) 200 MG capsule TK 1 C PO QHS (Patient not taking: Reported on 05/24/2020)  . tiZANidine (ZANAFLEX) 4 MG capsule TK 1 C PO TID PRN (Patient not taking: Reported on 05/24/2020)   No facility-administered encounter medications on file as of 05/24/2020.    Follow-up: Return in about 6 weeks (around 07/05/2020) for B12 and Vit D check + abdominal discomfort.   Venita Lick, NP

## 2020-05-24 NOTE — Assessment & Plan Note (Signed)
Noted on recent labs.  Recommend she take Vitamin B12 1000 MCG daily, will plan to recheck level in 6 weeks.  If poor benefit from oral will consider IM injections.  Could be underlying cause for her fatigue.

## 2020-05-25 LAB — IRON,TIBC AND FERRITIN PANEL
Ferritin: 24 ng/mL (ref 15–150)
Iron Saturation: 9 % — CL (ref 15–55)
Iron: 31 ug/dL (ref 27–159)
Total Iron Binding Capacity: 352 ug/dL (ref 250–450)
UIBC: 321 ug/dL (ref 131–425)

## 2020-05-25 LAB — COMPREHENSIVE METABOLIC PANEL
ALT: 13 IU/L (ref 0–32)
AST: 13 IU/L (ref 0–40)
Albumin/Globulin Ratio: 2 (ref 1.2–2.2)
Albumin: 4.6 g/dL (ref 3.8–4.9)
Alkaline Phosphatase: 108 IU/L (ref 44–121)
BUN/Creatinine Ratio: 13 (ref 9–23)
BUN: 11 mg/dL (ref 6–24)
Bilirubin Total: 0.2 mg/dL (ref 0.0–1.2)
CO2: 23 mmol/L (ref 20–29)
Calcium: 10.3 mg/dL — ABNORMAL HIGH (ref 8.7–10.2)
Chloride: 99 mmol/L (ref 96–106)
Creatinine, Ser: 0.86 mg/dL (ref 0.57–1.00)
GFR calc Af Amer: 90 mL/min/{1.73_m2} (ref >59)
GFR calc non Af Amer: 78 mL/min/{1.73_m2} (ref >59)
Globulin, Total: 2.3 g/dL (ref 1.5–4.5)
Glucose: 97 mg/dL (ref 65–99)
Potassium: 4 mmol/L (ref 3.5–5.2)
Sodium: 140 mmol/L (ref 134–144)
Total Protein: 6.9 g/dL (ref 6.0–8.5)

## 2020-05-25 LAB — CBC WITH DIFFERENTIAL/PLATELET
Basophils Absolute: 0 10*3/uL (ref 0.0–0.2)
Basos: 0 %
EOS (ABSOLUTE): 0.1 10*3/uL (ref 0.0–0.4)
Eos: 2 %
Hematocrit: 38.9 % (ref 34.0–46.6)
Hemoglobin: 13.2 g/dL (ref 11.1–15.9)
Immature Grans (Abs): 0 10*3/uL (ref 0.0–0.1)
Immature Granulocytes: 0 %
Lymphocytes Absolute: 2.3 10*3/uL (ref 0.7–3.1)
Lymphs: 31 %
MCH: 29.3 pg (ref 26.6–33.0)
MCHC: 33.9 g/dL (ref 31.5–35.7)
MCV: 86 fL (ref 79–97)
Monocytes Absolute: 0.5 10*3/uL (ref 0.1–0.9)
Monocytes: 7 %
Neutrophils Absolute: 4.4 10*3/uL (ref 1.4–7.0)
Neutrophils: 60 %
Platelets: 317 10*3/uL (ref 150–450)
RBC: 4.5 x10E6/uL (ref 3.77–5.28)
RDW: 13.1 % (ref 11.7–15.4)
WBC: 7.4 10*3/uL (ref 3.4–10.8)

## 2020-05-25 LAB — TSH: TSH: 1.26 u[IU]/mL (ref 0.450–4.500)

## 2020-05-25 NOTE — Progress Notes (Signed)
Contacted via [place] afternoon [name] [name], your labs have returned and overall look great, however, your iron level is on lower side of normal at 31.  I would recommend you start taking iron supplement daily -- I use Vitron which has Vitamin C in it and helps the iron absorb in the belly, but you can also use something like Slow FE over the counter which may not cause as much constipation.  Iron is important for overall health and may help with any fatigue you may have.  I HIGHLY recommend you schedule appointment as soon as possible with your GI provider for follow-up, especially due to recent symptoms and some mild anemia.  Any questions? Keep being awesome!!  Thank you for allowing me to participate in your care. Kindest regards, Alexcia Schools

## 2020-05-26 ENCOUNTER — Telehealth: Payer: Self-pay

## 2020-05-26 ENCOUNTER — Other Ambulatory Visit: Payer: Self-pay | Admitting: Nurse Practitioner

## 2020-05-26 DIAGNOSIS — K219 Gastro-esophageal reflux disease without esophagitis: Secondary | ICD-10-CM

## 2020-05-26 DIAGNOSIS — R1084 Generalized abdominal pain: Secondary | ICD-10-CM

## 2020-05-26 NOTE — Telephone Encounter (Signed)
Called pt to advise order is in per Jolene, no answer, left vm

## 2020-05-26 NOTE — Telephone Encounter (Signed)
Order in, discussed this issue at recent visit.

## 2020-05-26 NOTE — Telephone Encounter (Signed)
Copied from [place] [id number]. Topic: Referral - Request for Referral >> May 26, 2020  9:04 AM Hinda Lenis D wrote:   Has patient seen PCP for this complaint? {yes  *If NO, is insurance requiring patient see PCP for this issue before PCP can refer them? Referral for which specialty: Gastroenterology  Preferred provider/office:  [email or web address] > locations > [place] [address]. Phone: [phone]. Hours: 8:00 - 5:00 M-F  Reason for referral: Gastroenterology

## 2020-05-27 ENCOUNTER — Ambulatory Visit: Payer: 59 | Admitting: Psychiatry

## 2020-05-31 ENCOUNTER — Ambulatory Visit: Payer: 59 | Admitting: Psychiatry

## 2020-06-04 NOTE — Telephone Encounter (Signed)
Called pt advised order has been placed

## 2020-06-14 ENCOUNTER — Encounter: Payer: Self-pay | Admitting: *Deleted

## 2020-06-14 ENCOUNTER — Ambulatory Visit
Admission: EM | Admit: 2020-06-14 | Discharge: 2020-06-14 | Disposition: A | Discharge status: Home / Self Care | Payer: Managed Care, Other (non HMO) | Attending: Family Medicine | Admitting: Family Medicine

## 2020-06-14 DIAGNOSIS — K625 Hemorrhage of anus and rectum: Secondary | ICD-10-CM | POA: Diagnosis not present

## 2020-06-14 DIAGNOSIS — K648 Other hemorrhoids: Secondary | ICD-10-CM | POA: Diagnosis not present

## 2020-06-14 NOTE — Discharge Instructions (Addendum)
May use cortisone cream and dibucaine ointment as needed. You may make popcicle pads as well. Use aloe and witch hazel to a maxi pad and place in the refrigerator.  I would start taking miralax to help stool to become as soft as possible to make passing it less painful  We are checking a CBC today and will inform you of any abnormal results. Continue taking home iron regimen.  Follow up in the ER for dizziness, increased bleeding, trouble swallowing, increased pain,

## 2020-06-14 NOTE — ED Triage Notes (Signed)
Patient reports rectal discomfort 1.5 weeks, states she thinks it is hemorrhoids or a fissure. Reports discomfort to area with abdominal cramping. Reports rectal bleeding--intermittent with stools.

## 2020-06-15 LAB — CBC
Hematocrit: 41.7 % (ref 34.0–46.6)
Hemoglobin: 13.9 g/dL (ref 11.1–15.9)
MCH: 28.8 pg (ref 26.6–33.0)
MCHC: 33.3 g/dL (ref 31.5–35.7)
MCV: 86 fL (ref 79–97)
Platelets: 367 10*3/uL (ref 150–450)
RBC: 4.83 x10E6/uL (ref 3.77–5.28)
RDW: 13.2 % (ref 11.7–15.4)
WBC: 6.4 10*3/uL (ref 3.4–10.8)

## 2020-06-15 NOTE — ED Provider Notes (Signed)
Roderic Palau    CSN: [id number] Arrival date & time: 06/14/20  1459      History   Chief Complaint Chief Complaint  Patient presents with  . Hemorrhoids  . Abdominal Pain    HPI [name] [name] is a 54 y.o. female.   Reports rectal pain and bleeding with stools for the last week and a half. Has hx IBS, anal fissure and hemorrhoids. Reports that she has seen her PCP for this and has GI appt next month. Reports that she is in a lot of pain. Has been using hydrocortisone cream to the area with no pain relief. Reports that sometimes there is just a bit of blood on toilet paper after she moves her bowels and sometimes there is frank red blood in the toilet bowl. States that she does not want to go to the ER for this. She states that she feels like they will not help her any more than we can in urgent care. Denies headache, dizziness, weakness, rectal bleeding when not moving bowels, nausea, vomiting, diarrhea, rash, fever, other symptoms.  ROS per HPI  The history is provided by the patient.    Past Medical History:  Diagnosis Date  . Anxiety   . Arthritis   . B12 deficiency   . Chicken pox   . Depression   . Fibroid   . GERD (gastroesophageal reflux disease)   . History of hiatal hernia   . Hypertension   . Iron deficiency anemia   . Menorrhagia   . Migraines   . PAF (paroxysmal atrial fibrillation) ([place])    a. 04/2013 Echo: EF 55-60%, impaired relaxation. Mild MR/TR. Nl RV fxn; b. CHA2DS2VASc = 2; c. PRN flecainide.  . Snores     Patient Active Problem List   Diagnosis Date Noted  . Generalized abdominal pain 05/24/2020  . Gastric polyp   . Esophageal dysphagia   . Polyp of sigmoid colon   . Hypertrophy of anal papillae   . Diverticulosis of large intestine without diverticulitis   . HLD (hyperlipidemia) 03/12/2019  . Vitamin D deficiency 03/12/2019  . History of IBS 03/06/2019  . Pure hypertriglyceridemia 02/27/2019  . Menopause 02/27/2019  . Uterus,  adenomyosis 08/07/2018  . S/P total hysterectomy and BSO (bilateral salpingo-oophorectomy) 08/05/2018  . B12 deficiency 06/04/2018  . PTSD (post-traumatic stress disorder) 05/14/2018  . HSV (herpes simplex virus) infection 11/27/2017  . Anemia 03/08/2017  . HNP (herniated nucleus pulposus), cervical 07/29/2015  . Cervical radiculitis 04/22/2015  . Bipolar I disorder, most recent episode depressed ([place]) 12/10/2014  . GERD (gastroesophageal reflux disease) 12/10/2014  . Migraines 12/10/2014  . PAF (paroxysmal atrial fibrillation) ([place]) 04/10/2013    Past Surgical History:  Procedure Laterality Date  . ABDOMINAL HYSTERECTOMY    . APPENDECTOMY  5/11  . C-spine surgery    . CESAREAN SECTION  10/98,9/12  . COLONOSCOPY WITH PROPOFOL N/A 03/26/2019   Procedure: COLONOSCOPY WITH PROPOFOL;  Surgeon: Virgel Manifold, MD;  Location: [facility] ENDOSCOPY;  Service: Endoscopy;  Laterality: N/A;  . ESOPHAGOGASTRODUODENOSCOPY (EGD) WITH PROPOFOL N/A 03/26/2019   Procedure: ESOPHAGOGASTRODUODENOSCOPY (EGD) WITH PROPOFOL;  Surgeon: Virgel Manifold, MD;  Location: [facility] ENDOSCOPY;  Service: Endoscopy;  Laterality: N/A;  . HYSTERECTOMY ABDOMINAL WITH SALPINGO-OOPHORECTOMY Bilateral 08/05/2018   Procedure: HYSTERECTOMY ABDOMINAL WITH BILATERAL SALPINGO-OOPHORECTOMY;  Surgeon: Brayton Mars, MD;  Location: [facility] ORS;  Service: Gynecology;  Laterality: Bilateral;  . TONSILLECTOMY  03/2008  . TUBAL LIGATION      OB History  Gravida  5   Para  3   Term  2   Preterm  1   AB  2   Living  2     SAB      TAB  2   Ectopic      Multiple      Live Births  2        Obstetric Comments  1st Menstrual Cycle:  16 1st Pregnancy:  17         Home Medications    Prior to Admission medications   Medication Sig Start Date End Date Taking? Authorizing Provider  aspirin EC 81 MG tablet Take 81 mg by mouth daily.    [provider]  Cholecalciferol 1.25 MG (50000 UT) TABS Take  1 tablet by mouth once a week. For 8 weeks and then stop.  Return to office for lab draw. 05/24/20   Cannady, Henrine Screws T, NP  cyanocobalamin (,VITAMIN B-12,) 1000 MCG/ML injection Inject 1 mL (1,000 mcg total) into the muscle every 30 (thirty) days. 08/23/18   Cottle, Billey Co., MD  estradiol (CLIMARA - DOSED IN MG/24 HR) 0.05 mg/24hr patch Place 1 patch (0.05 mg total) onto the skin once a week. Patient not taking: Reported on 05/24/2020 09/23/18   Rubie Maid, MD  flecainide (TAMBOCOR) 50 MG tablet Take 50 mg by mouth 2 (two) times daily as needed.    [provider]  fluticasone (FLONASE) 50 MCG/ACT nasal spray Place 1 spray into both nostrils daily as needed for congestion. 07/22/18   [provider]  ibuprofen (ADVIL,MOTRIN) 600 MG tablet TAKE 1 TABLET BY MOUTH FOUR TIMES DAILY Patient taking differently: as needed.  08/26/18   Defrancesco, Alanda Slim, MD  Insulin Syringe-Needle U-100 (INSULIN SYRINGE 1CC/31GX5/16") 31G X 5/16" 1 ML MISC U UTD 03/25/19   [provider]  lamoTRIgine (LAMICTAL) 200 MG tablet Take 1 tablet (200 mg total) by mouth at bedtime. 04/06/20   Cottle, Billey Co., MD  LORazepam (ATIVAN) 1 MG tablet Take 1 tablet (1 mg total) by mouth every 8 (eight) hours. 04/06/20   Cottle, Billey Co., MD  metoprolol tartrate (LOPRESSOR) 25 MG tablet Take 1 tablet (25 mg total) by mouth 2 (two) times daily as needed. May take an additional tablet for palpitations or tachycardia 06/25/19   Gollan, Kathlene November, MD  Multiple Vitamin (MULTIVITAMIN WITH MINERALS) TABS tablet Take 1 tablet by mouth at bedtime.    [provider]  Oxcarbazepine (TRILEPTAL) 300 MG tablet Take 3 tablets (900 mg total) by mouth at bedtime. 04/06/20   Cottle, Billey Co., MD  pantoprazole (PROTONIX) 40 MG tablet Take 1 tablet (40 mg total) by mouth daily. 30 min before food. appt for further refills call the clinic for appt Patient not taking: Reported on 05/24/2020 03/07/20   McLean-Scocuzza,  Nino Glow, MD  progesterone (PROMETRIUM) 200 MG capsule TK 1 C PO QHS Patient not taking: Reported on 05/24/2020 03/25/19   [provider]  sertraline (ZOLOFT) 100 MG tablet Take 1 tablet (100 mg total) by mouth daily. 04/06/20   Cottle, Billey Co., MD  tiZANidine (ZANAFLEX) 4 MG capsule TK 1 C PO TID PRN Patient not taking: Reported on 05/24/2020 02/13/19   [provider]    Family History Family History  Problem Relation Age of Onset  . Diabetes Mother   . Alcohol abuse Father   . Arthritis Father   . Heart disease Father   . Arthritis  Sister   . Alcohol abuse Maternal Aunt   . Alcohol abuse Maternal Uncle   . Alcohol abuse Paternal Grandfather   . Lung cancer Paternal Grandfather        smoker   . Arthritis Maternal Grandmother   . Schizophrenia Maternal Grandmother   . Leukemia Maternal Grandfather   . Breast cancer Neg Hx     Social History Social History   Tobacco Use  . Smoking status: Former Smoker    Packs/day: 1.00    Years: 10.00    Pack years: 10.00    Types: Cigarettes    Quit date: 08/22/1991    Years since quitting: 28.8  . Smokeless tobacco: Never Used  Vaping Use  . Vaping Use: Never used  Substance Use Topics  . Alcohol use: Not Currently    Alcohol/week: 0.0 standard drinks    Comment: twice a year   . Drug use: Not Currently    Types: Marijuana    Comment: not for a couple years     Allergies   Phenylmercuric nitrate, Augmentin [amoxicillin-pot clavulanate], Codeine, Diclofenac, Latex, Neomycin, and Percocet [oxycodone-acetaminophen]   Review of Systems Review of Systems   Physical Exam Triage Vital Signs ED Triage Vitals  Enc Vitals Group     BP 06/14/20 1607 (!) 145/96     Pulse Rate 06/14/20 1607 68     Resp 06/14/20 1607 16     Temp 06/14/20 1607 97.9 F (36.6 C)     Temp Source 06/14/20 1607 Oral     SpO2 06/14/20 1607 98 %     Weight --      Height --      Head Circumference --      Peak Flow --      Pain  Score 06/14/20 1605 7     Pain Loc --      Pain Edu? --      Excl. in [place]? --    No data found.  Updated Vital Signs BP (!) 145/96 (BP Location: Left Arm)   Pulse 68   Temp 97.9 F (36.6 C) (Oral)   Resp 16   LMP  (LMP Unknown)   SpO2 98%      Physical Exam Vitals and nursing note reviewed.  Constitutional:      General: She is not in acute distress.    Appearance: She is well-developed.  HENT:     Head: Normocephalic and atraumatic.  Eyes:     Conjunctiva/sclera: Conjunctivae normal.  Cardiovascular:     Rate and Rhythm: Normal rate and regular rhythm.     Heart sounds: Normal heart sounds. No murmur heard.   Pulmonary:     Effort: Pulmonary effort is normal. No respiratory distress.     Breath sounds: Normal breath sounds.  Abdominal:     General: Bowel sounds are normal. There is no distension.     Palpations: Abdomen is soft. There is no mass.     Tenderness: There is no abdominal tenderness. There is no right CVA tenderness, left CVA tenderness, guarding or rebound.     Hernia: No hernia is present.  Genitourinary:      Comments: Area of visible hemorrhoids Musculoskeletal:        General: Normal range of motion.     Cervical back: Neck supple.  Skin:    General: Skin is warm and dry.     Capillary Refill: Capillary refill takes less than 2 seconds.  Neurological:  General: No focal deficit present.     Mental Status: She is alert and oriented to person, place, and time.  Psychiatric:        Mood and Affect: Mood normal.        Behavior: Behavior normal.        Thought Content: Thought content normal.      UC Treatments / Results  Labs (all labs ordered are listed, but only abnormal results are displayed) Labs Reviewed  CBC   Narrative:    Performed at:  [address], [place], [place]  [id number] Lab Director: Rush Farmer MD, Phone:  [phone]    EKG   Radiology No results found.  Procedures Procedures  (including critical care time)  Medications Ordered in UC Medications - No data to display  Initial Impression / Assessment and Plan / UC Course  I have reviewed the triage vital signs and the nursing notes.  Pertinent labs & imaging results that were available during my care of the patient were reviewed by me and considered in my medical decision making (see chart for details).     Hemorrhoids Rectal Bleeding  Discussed with patient that given her symptoms, she would be better served in the ER. She declines to go to the ER at this time Discussed that we cannot rule out GI bleeding or something else very serious without further imaging and testing, still declines the ER at this time May use dibucaine with cortisone cream to the area May apply aloe and witch hazel to maxi pads and place in the refrigerator, may use these as needed CBC pending Will contact with abnormal results Continue taking iron at home Follow up with GI as scheduled Follow up with the ER for increased bleeding, any dizziness, increased fatigue, loss of consciousness, other concerning symptoms Verbalized understanding and agrees to plan  Final Clinical Impressions(s) / UC Diagnoses   Final diagnoses:  Other hemorrhoids  Rectal bleeding     Discharge Instructions     May use cortisone cream and dibucaine ointment as needed. You may make popcicle pads as well. Use aloe and witch hazel to a maxi pad and place in the refrigerator.  I would start taking miralax to help stool to become as soft as possible to make passing it less painful  We are checking a CBC today and will inform you of any abnormal results. Continue taking home iron regimen.  Follow up in the ER for dizziness, increased bleeding, trouble swallowing, increased pain,     ED Prescriptions    None     PDMP not reviewed this encounter.   Faustino Congress, NP 06/15/20 [phone]

## 2020-06-18 ENCOUNTER — Other Ambulatory Visit: Payer: Self-pay | Admitting: Psychiatry

## 2020-06-18 DIAGNOSIS — F4310 Post-traumatic stress disorder, unspecified: Secondary | ICD-10-CM

## 2020-06-18 DIAGNOSIS — F40240 Claustrophobia: Secondary | ICD-10-CM

## 2020-06-18 DIAGNOSIS — F4001 Agoraphobia with panic disorder: Secondary | ICD-10-CM

## 2020-06-18 DIAGNOSIS — F411 Generalized anxiety disorder: Secondary | ICD-10-CM

## 2020-06-25 ENCOUNTER — Other Ambulatory Visit: Payer: Self-pay | Admitting: Physician Assistant

## 2020-06-25 DIAGNOSIS — F319 Bipolar disorder, unspecified: Secondary | ICD-10-CM

## 2020-07-05 ENCOUNTER — Ambulatory Visit: Payer: Managed Care, Other (non HMO) | Admitting: Nurse Practitioner

## 2020-07-28 ENCOUNTER — Encounter: Payer: Self-pay | Admitting: Gastroenterology

## 2020-07-28 ENCOUNTER — Ambulatory Visit (INDEPENDENT_AMBULATORY_CARE_PROVIDER_SITE_OTHER): Payer: Managed Care, Other (non HMO) | Admitting: Gastroenterology

## 2020-07-28 ENCOUNTER — Telehealth: Payer: Self-pay

## 2020-07-28 VITALS — BP 138/85 | HR 86 | Temp 97.6°F | Ht 65.0 in | Wt 207.0 lb

## 2020-07-28 DIAGNOSIS — K644 Residual hemorrhoidal skin tags: Secondary | ICD-10-CM

## 2020-07-28 DIAGNOSIS — E611 Iron deficiency: Secondary | ICD-10-CM

## 2020-07-28 DIAGNOSIS — K219 Gastro-esophageal reflux disease without esophagitis: Secondary | ICD-10-CM

## 2020-07-28 DIAGNOSIS — K625 Hemorrhage of anus and rectum: Secondary | ICD-10-CM | POA: Diagnosis not present

## 2020-07-28 MED ORDER — OMEPRAZOLE 20 MG PO CPDR: 20.0000 mg | DELAYED_RELEASE_CAPSULE | Freq: Every day | ORAL | 0 refills | Status: DC

## 2020-07-28 NOTE — Patient Instructions (Addendum)
We will send your referral to [facility] surgery and they will contact you to schedule your consult for your external hemorrhoids.  We will call in your Nifedipine ointment to [facility] since they are the only ones that have this ointment. [facility]: [phone]

## 2020-07-28 NOTE — Progress Notes (Signed)
Vonda Antigua, MD [address]  [place]  [place], [place] [address]  Main: [phone]  Fax: [phone]   Primary Care Physician: Venita Lick, NP   Chief Complaint  Patient presents with  . GI Bleeding    HPI: [name] [name] is a 54 y.o. female previously seen a year ago for GERD here for follow-up.  Her main complaint now is 6 weeks of bright red blood per rectum with normal hemoglobin since her symptoms started.  Patient reports daily bright red blood per rectum for the last 6 weeks.  Also reports rectal pain with bowel movements.  She has tried hydrocortisone creams, lidocaine ointment and Preparation H.  The lidocaine ointment was what helped the most, but pain continues.  She normally has 1-2 soft, almost mashed potato consistency bowel movements every day.  However, due to the pain she has been avoiding going to the bathroom and this has led to some hard stools very recently, but bleeding was continuing even before then.  Patient also describes having return of heartburn recently and started taking over-the-counter omeprazole over the last month and symptoms have completely resolved with this.  Previous history:  August 2020 procedures: Impression:           - Normal esophagus. Biopsied.                       - Erythematous mucosa in the antrum. Biopsied.                       - Multiple gastric polyps. Biopsied.                       - Normal duodenal bulb, second portion of the duodenum                        and examined duodenum. Biopsied.                       - Biopsies were obtained in the gastric body, at the                        incisura and in the gastric antrum.   Impression:           - Two 4 mm polyps in the sigmoid colon, removed with a                        cold biopsy forceps. Resected and retrieved.                       - Diverticulosis in the sigmoid colon.                       - The examination was otherwise normal.                        - The rectum, sigmoid colon, descending colon,                        transverse colon, ascending colon and cecum are normal.                       - Anal papilla(e) were hypertrophied.                       -  The distal rectum and anal verge are normal on                        retroflexion view.  Pathology: A. DUODENUM; COLD BIOPSY:  - DUODENAL MUCOSA WITH INTACT VILLI.  - NEGATIVE FOR ACTIVE INFLAMMATION, INTRAEPITHELIAL LYMPHOCYTOSIS, AND  INFECTIOUS AGENTS.   B. STOMACH, ERYTHEMA; COLD BIOPSY:  - ANTRAL AND OXYNTIC MUCOSA WITHOUT SPECIFIC PATHOLOGIC CHANGES.  - NEGATIVE FOR H. PYLORI, INTESTINAL METAPLASIA, DYSPLASIA, AND  MALIGNANCY.   C. STOMACH POLYP X 1; COLD BIOPSY:  - FUNDIC GLAND POLYP.  - NEGATIVE FOR DYSPLASIA AND MALIGNANCY.   D. ESOPHAGUS, DISTAL; COLD BIOPSY:  - STRATIFIED SQUAMOUS EPITHELIUM WITH VASCULAR LAKES AND FOCAL BASAL  HYPERPLASIA, SUGGESTIVE OF REFLUX ESOPHAGITIS.  - NEGATIVE FOR INTRAEPITHELIAL EOSINOPHILS AND NEUTROPHILS.  - NEGATIVE FOR DYSPLASIA AND MALIGNANCY.   E. ESOPHAGUS, PROXIMAL; COLD BIOPSY:  - STRATIFIED SQUAMOUS EPITHELIUM WITHOUT EOSINOPHILS, NEUTROPHILS, OR  REACTIVE CHANGES.  - NEGATIVE FOR DYSPLASIA AND MALIGNANCY.   F. COLON POLYP X 2, SIGMOID; COLD BIOPSY:  - HYPERPLASTIC POLYPS, 2 FRAGMENTS.  - NEGATIVE FOR DYSPLASIA AND MALIGNANCY.   Current Outpatient Medications  Medication Sig Dispense Refill  . fluticasone (FLONASE) 50 MCG/ACT nasal spray Place 1 spray into both nostrils daily as needed for congestion.  0  . ibuprofen (ADVIL,MOTRIN) 600 MG tablet TAKE 1 TABLET BY MOUTH FOUR TIMES DAILY (Patient taking differently: as needed. ) 50 tablet 0  . lamoTRIgine (LAMICTAL) 200 MG tablet TAKE 1 TABLET BY MOUTH  EVERY NIGHT AT BEDTIME 90 tablet 3  . LORazepam (ATIVAN) 1 MG tablet Take 1 tablet (1 mg total) by mouth every 8 (eight) hours. 30 tablet 3  . Multiple Vitamin (MULTIVITAMIN WITH MINERALS) TABS tablet Take 1  tablet by mouth at bedtime.    Marland Kitchen omeprazole (PRILOSEC) 20 MG capsule Take 20 mg by mouth daily.    . Oxcarbazepine (TRILEPTAL) 300 MG tablet Take 3 tablets (900 mg total) by mouth at bedtime. 270 tablet 1  . sertraline (ZOLOFT) 100 MG tablet TAKE 1 TABLET BY MOUTH  DAILY 90 tablet 3   No current facility-administered medications for this visit.    Allergies as of 07/28/2020 - Review Complete 07/28/2020  Allergen Reaction Noted  . Phenylmercuric nitrate Rash 07/02/2014  . Augmentin [amoxicillin-pot clavulanate] Nausea Only 02/10/2013  . Codeine Nausea Only 02/10/2013  . Diclofenac Nausea Only   . Latex Rash 02/10/2013  . Neomycin Rash 06/26/2015  . Percocet [oxycodone-acetaminophen] Rash 02/10/2013    ROS:  General: Negative for anorexia, weight loss, fever, chills, fatigue, weakness. ENT: Negative for hoarseness, difficulty swallowing , nasal congestion. CV: Negative for chest pain, angina, palpitations, dyspnea on exertion, peripheral edema.  Respiratory: Negative for dyspnea at rest, dyspnea on exertion, cough, sputum, wheezing.  GI: See history of present illness. GU:  Negative for dysuria, hematuria, urinary incontinence, urinary frequency, nocturnal urination.  Endo: Negative for unusual weight change.    Physical Examination:   BP 138/85   Pulse 86   Temp 97.6 F (36.4 C) (Oral)   Ht 5\' 5"  (1.651 m)   Wt 207 lb (93.9 kg)   LMP  (LMP Unknown)   BMI 34.45 kg/m   General: Well-nourished, well-developed in no acute distress.  Eyes: No icterus. Conjunctivae pink. Mouth: Oropharyngeal mucosa moist and pink , no lesions erythema or exudate. Neck: Supple, Trachea midline Abdomen: Bowel sounds are normal, nontender, nondistended, no hepatosplenomegaly or masses, no abdominal bruits or hernia ,  no rebound or guarding.   Rectal exam: Nonbleeding external hemorrhoids present, one of them is particularly painful to palpation, but is not enlarged or erythematous.  DRE did not  reveal any rectal masses in the rectal vault. Extremities: No lower extremity edema. No clubbing or deformities. Neuro: Alert and oriented x 3.  Grossly intact. Skin: Warm and dry, no jaundice.   Psych: Alert and cooperative, normal mood and affect.   Labs: CMP     Component Value Date/Time   NA 140 05/24/2020 1701   NA 138 11/25/2014 2152   K 4.0 05/24/2020 1701   K 3.7 11/25/2014 2152   CL 99 05/24/2020 1701   CL 105 11/25/2014 2152   CO2 23 05/24/2020 1701   CO2 28 11/25/2014 2152   GLUCOSE 97 05/24/2020 1701   GLUCOSE 108 (H) 12/26/2016 0812   GLUCOSE 98 11/25/2014 2152   BUN 11 05/24/2020 1701   BUN 10 11/25/2014 2152   CREATININE 0.86 05/24/2020 1701   CREATININE 0.81 11/25/2014 2152   CALCIUM 10.3 (H) 05/24/2020 1701   CALCIUM 10.0 11/25/2014 2152   PROT 6.9 05/24/2020 1701   PROT 7.2 11/25/2014 2152   ALBUMIN 4.6 05/24/2020 1701   ALBUMIN 4.1 11/25/2014 2152   AST 13 05/24/2020 1701   AST 15 11/25/2014 2152   ALT 13 05/24/2020 1701   ALT 13 (L) 11/25/2014 2152   ALKPHOS 108 05/24/2020 1701   ALKPHOS 61 11/25/2014 2152   BILITOT 0.2 05/24/2020 1701   BILITOT 0.4 11/25/2014 2152   GFRNONAA 78 05/24/2020 1701   GFRNONAA >60 11/25/2014 2152   GFRAA 90 05/24/2020 1701   GFRAA >60 11/25/2014 2152   Lab Results  Component Value Date   WBC 6.4 06/14/2020   HGB 13.9 06/14/2020   HCT 41.7 06/14/2020   MCV 86 06/14/2020   PLT 367 06/14/2020    Imaging Studies: No results found.  Assessment and Plan:   KAE LAUMAN is a 54 y.o. y/o female with a history of reflux, presenting for bright blood per rectum for the last 6 weeks  Given patient's ongoing bright red blood per rectum despite no straining with stools, soft stools, and external hemorrhoids present on exam, will refer to surgery for evaluation of hemorrhoidectomy  However, the level of pain during her rectal exam today does not match the small benign external hemorrhoids seen.  Patient may have a  small anal fissure present that was not visualized during the exam today, as patient was not able to tolerate the exam more than a brief period of time, due to pain and discomfort during the rectal exam  Therefore, we will prescribe nifedipine ointment that is used for anal fissures and assess if her pain gets better.  She has already tried lidocaine ointment, hydrocortisone topically and continues to have symptoms despite this  Continue high-fiber diet  Patient unable to tolerate oral iron as it leads to constipation and further exacerbation of above issues.  Refer to hematology for iron deficiency  If iron deficiency continues once right lower per rectum resolves, consider small bowel capsule study  Okay to take PPI once a day for 1 to 2 months and then patient was encouraged to discontinue this after that  (Risks of PPI use were discussed with patient including bone loss, C. Diff diarrhea, pneumonia, infections, CKD, electrolyte abnormalities.  Pt. Verbalizes understanding and chooses to continue the medication.)  Patient educated extensively on acid reflux lifestyle modification, including buying a bed wedge, not eating  3 hrs before bedtime, diet modifications, and handout given for the same.    Dr Vonda Antigua

## 2020-07-28 NOTE — Telephone Encounter (Signed)
Referral was faxed to [facility] [place] [facility] for a consult. They will contact the patient to schedule an appointment. Phone # [phone]

## 2020-08-02 ENCOUNTER — Ambulatory Visit: Payer: Managed Care, Other (non HMO) | Admitting: Nurse Practitioner

## 2020-08-05 ENCOUNTER — Inpatient Hospital Stay: Payer: Managed Care, Other (non HMO) | Attending: Oncology

## 2020-08-05 ENCOUNTER — Inpatient Hospital Stay: Payer: Managed Care, Other (non HMO) | Admitting: Oncology

## 2020-08-17 ENCOUNTER — Inpatient Hospital Stay: Payer: Managed Care, Other (non HMO) | Admitting: Oncology

## 2020-08-17 NOTE — Progress Notes (Deleted)
Hematology/Oncology Consult note [facility] [phone] [phone]  Patient Care Team: Venita Lick, NP as PCP - General (Nurse Practitioner) Minna Merritts, MD as PCP - Cardiology (Cardiology) Christene Lye, MD (General Surgery)   Name of the patient: [name] [name]  MR:[id number]  1966/01/11    Reason for referral-iron deficiency anemia   Referring physician-Dr. Bonna Gains  Date of visit: 08/17/20   History of presenting illness- Patient is a 54 year old female with history of for which she follows up with Dr. Bonna Gains.  She has undergone EGD and colonoscopy in 2020 which showed polyps in the sigmoid colon that was negative for malignancy.  Her most recent CBC from 06/14/2020 showed H&H of 13.9/21.7.  Iron study showed a low ferritin of 24 and iron saturation of 9%.  She has tried taking oral iron but is unable to tolerate it due to constipation.  She has been referred to [place] for consideration for IV iron  ECOG PS- ***  Pain scale- ***   Review of systems- ROS  Allergies  Allergen Reactions  . Phenylmercuric Nitrate Rash  . Augmentin [Amoxicillin-Pot Clavulanate] Nausea Only    Has patient had a PCN reaction causing immediate rash, facial/tongue/throat swelling, SOB or lightheadedness with hypotension: No Has patient had a PCN reaction causing severe rash involving mucus membranes or skin necrosis: No Has patient had a PCN reaction that required hospitalization: No Has patient had a PCN reaction occurring within the last 10 years: Yes If all of the above answers are "NO", then may proceed with Cephalosporin use.   . Codeine Nausea Only  . Diclofenac Nausea Only  . Latex Rash  . Neomycin Rash  . Percocet [Oxycodone-Acetaminophen] Rash    Hives     Patient Active Problem List   Diagnosis Date Noted  . Generalized abdominal pain 05/24/2020  . Gastric polyp   . Esophageal dysphagia   . Polyp of sigmoid colon    . Hypertrophy of anal papillae   . Diverticulosis of large intestine without diverticulitis   . HLD (hyperlipidemia) 03/12/2019  . Vitamin D deficiency 03/12/2019  . History of IBS 03/06/2019  . Pure hypertriglyceridemia 02/27/2019  . Menopause 02/27/2019  . Uterus, adenomyosis 08/07/2018  . S/P total hysterectomy and BSO (bilateral salpingo-oophorectomy) 08/05/2018  . B12 deficiency 06/04/2018  . PTSD (post-traumatic stress disorder) 05/14/2018  . HSV (herpes simplex virus) infection 11/27/2017  . Anemia 03/08/2017  . HNP (herniated nucleus pulposus), cervical 07/29/2015  . Cervical radiculitis 04/22/2015  . Bipolar I disorder, most recent episode depressed ([place]) 12/10/2014  . GERD (gastroesophageal reflux disease) 12/10/2014  . Migraines 12/10/2014  . PAF (paroxysmal atrial fibrillation) ([place]) 04/10/2013     Past Medical History:  Diagnosis Date  . Anxiety   . Arthritis   . B12 deficiency   . Chicken pox   . Depression   . Fibroid   . GERD (gastroesophageal reflux disease)   . History of hiatal hernia   . Hypertension   . Iron deficiency anemia   . Menorrhagia   . Migraines   . PAF (paroxysmal atrial fibrillation) ([place])    a. 04/2013 Echo: EF 55-60%, impaired relaxation. Mild MR/TR. Nl RV fxn; b. CHA2DS2VASc = 2; c. PRN flecainide.  . Snores      Past Surgical History:  Procedure Laterality Date  . ABDOMINAL HYSTERECTOMY    . APPENDECTOMY  5/11  . C-spine surgery    . CESAREAN SECTION  10/98,9/12  . COLONOSCOPY WITH PROPOFOL  N/A 03/26/2019   Procedure: COLONOSCOPY WITH PROPOFOL;  Surgeon: Virgel Manifold, MD;  Location: [facility] END[facility]COPY;  Service: Endoscopy;  Laterality: N/A;  . ESOPHAGOGASTRODUODENOSCOPY (EGD) WITH PROPOFOL N/A 03/26/2019   Procedure: ESOPHAGOGASTRODUODENOSCOPY (EGD) WITH PROPOFOL;  Surgeon: Virgel Manifold, MD;  Location: [facility] ENDOSCOPY;  Service: Endoscopy;  Laterality: N/A;  . HYSTERECTOMY ABDOMINAL WITH SALPINGO-OOPHORECTOMY Bilateral  08/05/2018   Procedure: HYSTERECTOMY ABDOMINAL WITH BILATERAL SALPINGO-OOPHORECTOMY;  Surgeon: Brayton Mars, MD;  Location: [facility] ORS;  Service: Gynecology;  Laterality: Bilateral;  . TONSILLECTOMY  03/2008  . TUBAL LIGATION      Social History   Socioeconomic History  . Marital status: Legally Separated    Spouse name: Not on file  . Number of children: Not on file  . Years of education: Not on file  . Highest education level: Not on file  Occupational History  . Not on file  Tobacco Use  . Smoking status: Former Smoker    Packs/day: 1.00    Years: 10.00    Pack years: 10.00    Types: Cigarettes    Quit date: 08/22/1991    Years since quitting: 29.0  . Smokeless tobacco: Never Used  Vaping Use  . Vaping Use: Never used  Substance and Sexual Activity  . Alcohol use: Not Currently    Alcohol/week: 0.0 standard drinks    Comment: twice a year   . Drug use: Not Currently    Types: Marijuana    Comment: not for a couple years  . Sexual activity: Yes    Partners: Male    Birth control/protection: Surgical    Comment: Husband  Other Topics Concern  . Not on file  Social History Narrative   [employer]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    Lives with husband and 2 children    Son- 44 yo and daughter 39 yo    Pets: 2 dogs    Caffeine- 2 sodas 20 oz, coffee 1 cup occasionally    Enjoys being outside with family       DPR mom Horton Chin [phone]    Social Determinants of Health   Financial Resource Strain: Not on file  Food Insecurity: Not on file  Transportation Needs: Not on file  Physical Activity: Not on file  Stress: Not on file  Social Connections: Not on file  Intimate Partner Violence: Not on file     Family History  Problem Relation Age of Onset  . Diabetes Mother   . Alcohol abuse Father   . Arthritis Father   . Heart disease Father   . Arthritis Sister   . Alcohol abuse Maternal Aunt   . Alcohol abuse Maternal Uncle   . Alcohol abuse  Paternal Grandfather   . Lung cancer Paternal Grandfather        smoker   . Arthritis Maternal Grandmother   . Schizophrenia Maternal Grandmother   . Leukemia Maternal Grandfather   . Breast cancer Neg Hx      Current Outpatient Medications:  .  fluticasone (FLONASE) 50 MCG/ACT nasal spray, Place 1 spray into both nostrils daily as needed for congestion., Disp: , Rfl: 0 .  ibuprofen (ADVIL,MOTRIN) 600 MG tablet, TAKE 1 TABLET BY MOUTH FOUR TIMES DAILY (Patient taking differently: as needed. ), Disp: 50 tablet, Rfl: 0 .  lamoTRIgine (LAMICTAL) 200 MG tablet, TAKE 1 TABLET BY MOUTH  EVERY NIGHT AT BEDTIME, Disp: 90 tablet, Rfl: 3 .  LORazepam (ATIVAN) 1 MG tablet, Take 1 tablet (  1 mg total) by mouth every 8 (eight) hours., Disp: 30 tablet, Rfl: 3 .  Multiple Vitamin (MULTIVITAMIN WITH MINERALS) TABS tablet, Take 1 tablet by mouth at bedtime., Disp: , Rfl:  .  omeprazole (PRILOSEC) 20 MG capsule, Take 1 capsule (20 mg total) by mouth daily., Disp: 30 capsule, Rfl: 0 .  Oxcarbazepine (TRILEPTAL) 300 MG tablet, Take 3 tablets (900 mg total) by mouth at bedtime., Disp: 270 tablet, Rfl: 1 .  sertraline (ZOLOFT) 100 MG tablet, TAKE 1 TABLET BY MOUTH  DAILY, Disp: 90 tablet, Rfl: 3   Physical exam: There were no vitals filed for this visit. Physical Exam     CMP Latest Ref Rng & Units 05/24/2020  Glucose 65 - 99 mg/dL 97  BUN 6 - 24 mg/dL 11  Creatinine 0.34 - 7.42 mg/dL 5.95  Sodium 638 - 756 mmol/L 140  Potassium 3.5 - 5.2 mmol/L 4.0  Chloride 96 - 106 mmol/L 99  CO2 20 - 29 mmol/L 23  Calcium 8.7 - 10.2 mg/dL 10.3(H)  Total Protein 6.0 - 8.5 g/dL 6.9  Total Bilirubin 0.0 - 1.2 mg/dL 0.2  Alkaline Phos 44 - 121 IU/L 108  AST 0 - 40 IU/L 13  ALT 0 - 32 IU/L 13   CBC Latest Ref Rng & Units 06/14/2020  WBC 3.4 - 10.8 x10E3/uL 6.4  Hemoglobin 11.1 - 15.9 g/dL 43.3  Hematocrit 29.5 - 46.6 % 41.7  Platelets 150 - 450 x10E3/uL 367    No images are attached to the encounter.  No  results found.  Assessment and plan- Patient is a 54 y.o. female referred for iron deficiency anemia  Patient is not overtly anemic at this time but recent iron studies from October did not show a low ferritin of 24 and a low iron saturation of 9%.  Patient has not been able to tolerate oral iron due to constipation.  Back in August 2021 she was also found to have a low B12 level.  Today I will check a CBC with differential ferritin and iron studies and B12.   Thank you for this kind referral and the opportunity to participate in the care of this  Patient   Visit Diagnosis No diagnosis found.  Dr. Owens Shark, MD, MPH [facility] at [facility] [phone] 08/17/2020

## 2020-08-22 ENCOUNTER — Ambulatory Visit
Admission: EM | Admit: 2020-08-22 | Discharge: 2020-08-22 | Disposition: A | Discharge status: Home / Self Care | Payer: Managed Care, Other (non HMO) | Attending: Family Medicine | Admitting: Family Medicine

## 2020-08-22 ENCOUNTER — Encounter: Payer: Self-pay | Admitting: Emergency Medicine

## 2020-08-22 ENCOUNTER — Other Ambulatory Visit: Payer: Self-pay

## 2020-08-22 DIAGNOSIS — Z20822 Contact with and (suspected) exposure to covid-19: Secondary | ICD-10-CM | POA: Diagnosis not present

## 2020-08-22 DIAGNOSIS — B349 Viral infection, unspecified: Secondary | ICD-10-CM | POA: Diagnosis present

## 2020-08-22 LAB — GROUP A STREP BY PCR: Group A Strep by PCR: NOT DETECTED

## 2020-08-22 MED ORDER — ONDANSETRON 4 MG PO TBDP: 4.0000 mg | ORAL_TABLET | Freq: Three times a day (TID) | ORAL | 0 refills | Status: DC | PRN

## 2020-08-22 NOTE — ED Triage Notes (Signed)
Patient c/o nasal congestion, headache and fatigue that started this morning.

## 2020-08-22 NOTE — ED Triage Notes (Signed)
Pt requesting Strep testing as well

## 2020-08-22 NOTE — Discharge Instructions (Signed)
Medication as prescribed.  Tylenol and ibuprofen as needed for fever, headache, body aches.  Stay home.  Covid test results should be back in the morning.

## 2020-08-22 NOTE — ED Provider Notes (Addendum)
[facility]    CSN: [id number] Arrival date & time: 08/22/20  1552  History   Chief Complaint Chief Complaint  Patient presents with  . Headache    [phone]  . Nasal Congestion        HPI   55 year old female presents with respiratory symptoms.  Symptoms started this morning.  Patient states that she had some preceding fatigue over the past 2 days.  She reports congestion, headache.  She had some nausea as well.  Also has had body aches.  No documented fever.  She works in [employer].  No other reported symptoms.  No other complaints.  Past Medical History:  Diagnosis Date  . Anxiety   . Arthritis   . B12 deficiency   . Chicken pox   . Depression   . Fibroid   . GERD (gastroesophageal reflux disease)   . History of hiatal hernia   . Hypertension   . Iron deficiency anemia   . Menorrhagia   . Migraines   . PAF (paroxysmal atrial fibrillation) ([place])    a. 04/2013 Echo: EF 55-60%, impaired relaxation. Mild MR/TR. Nl RV fxn; b. CHA2DS2VASc = 2; c. PRN flecainide.  . Snores     Patient Active Problem List   Diagnosis Date Noted  . Generalized abdominal pain 05/24/2020  . Gastric polyp   . Esophageal dysphagia   . Polyp of sigmoid colon   . Hypertrophy of anal papillae   . Diverticulosis of large intestine without diverticulitis   . HLD (hyperlipidemia) 03/12/2019  . Vitamin D deficiency 03/12/2019  . History of IBS 03/06/2019  . Pure hypertriglyceridemia 02/27/2019  . Menopause 02/27/2019  . Uterus, adenomyosis 08/07/2018  . S/P total hysterectomy and BSO (bilateral salpingo-oophorectomy) 08/05/2018  . B12 deficiency 06/04/2018  . PTSD (post-traumatic stress disorder) 05/14/2018  . HSV (herpes simplex virus) infection 11/27/2017  . Anemia 03/08/2017  . HNP (herniated nucleus pulposus), cervical 07/29/2015  . Cervical radiculitis 04/22/2015  . Bipolar I disorder, most recent episode depressed ([place]) 12/10/2014  . GERD (gastroesophageal reflux  disease) 12/10/2014  . Migraines 12/10/2014  . PAF (paroxysmal atrial fibrillation) ([place]) 04/10/2013    Past Surgical History:  Procedure Laterality Date  . ABDOMINAL HYSTERECTOMY    . APPENDECTOMY  5/11  . C-spine surgery    . CESAREAN SECTION  10/98,9/12  . COLONOSCOPY WITH PROPOFOL N/A 03/26/2019   Procedure: COLONOSCOPY WITH PROPOFOL;  Surgeon: Virgel Manifold, MD;  Location: [facility] END[facility];  Service: Endoscopy;  Laterality: N/A;  . ESOPHAGOGASTRODUODENOSCOPY (EGD) WITH PROPOFOL N/A 03/26/2019   Procedure: ESOPHAGOGASTRODUODENOSCOPY (EGD) WITH PROPOFOL;  Surgeon: Virgel Manifold, MD;  Location: [facility] END[facility];  Service: Endoscopy;  Laterality: N/A;  . HYSTERECTOMY ABDOMINAL WITH SALPINGO-OOPHORECTOMY Bilateral 08/05/2018   Procedure: HYSTERECTOMY ABDOMINAL WITH BILATERAL SALPINGO-OOPHORECTOMY;  Surgeon: Brayton Mars, MD;  Location: [facility] ORS;  Service: Gynecology;  Laterality: Bilateral;  . TONSILLECTOMY  03/2008  . TUBAL LIGATION      OB History    Gravida  5   Para  3   Term  2   Preterm  1   AB  2   Living  2     SAB      IAB  2   Ectopic      Multiple      Live Births  2        Obstetric Comments  1st Menstrual Cycle:  16 1st Pregnancy:  17         Home Medications  Prior to Admission medications   Medication Sig Start Date End Date Taking? Authorizing Provider  fluticasone (FLONASE) 50 MCG/ACT nasal spray Place 1 spray into both nostrils daily as needed for congestion. 07/22/18  Yes [provider]  ibuprofen (ADVIL,MOTRIN) 600 MG tablet TAKE 1 TABLET BY MOUTH FOUR TIMES DAILY Patient taking differently: as needed. 08/26/18  Yes Defrancesco, Alanda Slim, MD  lamoTRIgine (LAMICTAL) 200 MG tablet TAKE 1 TABLET BY MOUTH  EVERY NIGHT AT BEDTIME 06/28/20  Yes Cottle, Billey Co., MD  LORazepam (ATIVAN) 1 MG tablet Take 1 tablet (1 mg total) by mouth every 8 (eight) hours. 04/06/20  Yes Cottle, Billey Co., MD  Multiple Vitamin  (MULTIVITAMIN WITH MINERALS) TABS tablet Take 1 tablet by mouth at bedtime.   Yes [provider]  omeprazole (PRILOSEC) 20 MG capsule Take 1 capsule (20 mg total) by mouth daily. 07/28/20  Yes Virgel Manifold, MD  ondansetron (ZOFRAN ODT) 4 MG disintegrating tablet Take 1 tablet (4 mg total) by mouth every 8 (eight) hours as needed for nausea or vomiting. 08/22/20  Yes Mical Brun G, DO  Oxcarbazepine (TRILEPTAL) 300 MG tablet Take 3 tablets (900 mg total) by mouth at bedtime. 04/06/20  Yes Cottle, Billey Co., MD  sertraline (ZOLOFT) 100 MG tablet TAKE 1 TABLET BY MOUTH  DAILY 06/21/20  Yes Cottle, Billey Co., MD    Family History Family History  Problem Relation Age of Onset  . Diabetes Mother   . Alcohol abuse Father   . Arthritis Father   . Heart disease Father   . Arthritis Sister   . Alcohol abuse Maternal Aunt   . Alcohol abuse Maternal Uncle   . Alcohol abuse Paternal Grandfather   . Lung cancer Paternal Grandfather        smoker   . Arthritis Maternal Grandmother   . Schizophrenia Maternal Grandmother   . Leukemia Maternal Grandfather   . Breast cancer Neg Hx     Social History Social History   Tobacco Use  . Smoking status: Former Smoker    Packs/day: 1.00    Years: 10.00    Pack years: 10.00    Types: Cigarettes    Quit date: 08/22/1991    Years since quitting: 29.0  . Smokeless tobacco: Never Used  Vaping Use  . Vaping Use: Never used  Substance Use Topics  . Alcohol use: Not Currently    Alcohol/week: 0.0 standard drinks    Comment: twice a year   . Drug use: Not Currently    Types: Marijuana    Comment: not for a couple years     Allergies   Phenylmercuric nitrate, Augmentin [amoxicillin-pot clavulanate], Codeine, Diclofenac, Latex, Neomycin, and Percocet [oxycodone-acetaminophen]   Review of Systems Review of Systems Per HPI  Physical Exam Triage Vital Signs ED Triage Vitals  Enc Vitals Group     BP 08/22/20 1737 (!) 140/93      Pulse Rate 08/22/20 1737 80     Resp 08/22/20 1737 18     Temp 08/22/20 1737 98.5 F (36.9 C)     Temp Source 08/22/20 1737 Oral     SpO2 08/22/20 1737 98 %     Weight 08/22/20 1656 200 lb (90.7 kg)     Height 08/22/20 1656 5\' 5"  (1.651 m)     Head Circumference --      Peak Flow --      Pain Score 08/22/20 1655 4     Pain Loc --  Pain Edu? --      Excl. in GC? --    Updated Vital Signs BP (!) 140/93 (BP Location: Left Arm)   Pulse 80   Temp 98.5 F (36.9 C) (Oral)   Resp 18   Ht 5\' 5"  (1.651 m)   Wt 90.7 kg   LMP  (LMP Unknown)   SpO2 98%   BMI 33.28 kg/m   Visual Acuity Right Eye Distance:   Left Eye Distance:   Bilateral Distance:    Right Eye Near:   Left Eye Near:    Bilateral Near:     Physical Exam Vitals and nursing note reviewed.  Constitutional:      General: She is not in acute distress.    Appearance: Normal appearance. She is not ill-appearing.  HENT:     Head: Normocephalic and atraumatic.  Cardiovascular:     Rate and Rhythm: Normal rate and regular rhythm.     Heart sounds: No murmur heard.   Pulmonary:     Effort: Pulmonary effort is normal.     Breath sounds: Normal breath sounds. No wheezing, rhonchi or rales.  Neurological:     Mental Status: She is alert.  Psychiatric:        Mood and Affect: Mood normal.        Behavior: Behavior normal.     UC Treatments / Results  Labs (all labs ordered are listed, but only abnormal results are displayed) Labs Reviewed  SARS CORONAVIRUS 2 (TAT 6-24 HRS)  GROUP A STREP BY PCR    EKG   Radiology No results found.  Procedures Procedures (including critical care time)  Medications Ordered in UC Medications - No data to display  Initial Impression / Assessment and Plan / UC Course  I have reviewed the triage vital signs and the nursing notes.  Pertinent labs & imaging results that were available during my care of the patient were reviewed by me and considered in my medical  decision making (see chart for details).    55 year old female presents with a viral illness. Concern for COVID 19. Awaiting test results. Zofran as needed. Tylenol and Ibuprofen as needed. Supportive care.   Final Clinical Impressions(s) / UC Diagnoses   Final diagnoses:  Viral illness     Discharge Instructions     Medication as prescribed.  Tylenol and ibuprofen as needed for fever, headache, body aches.  Stay home.  Covid test results should be back in the morning.      ED Prescriptions    Medication Sig Dispense Auth. Provider   ondansetron (ZOFRAN ODT) 4 MG disintegrating tablet Take 1 tablet (4 mg total) by mouth every 8 (eight) hours as needed for nausea or vomiting. 20 tablet 44, DO     PDMP not reviewed this encounter.   Tommie Sams, DO 08/22/20 2345    10/20/20 G, DO 08/23/20 1038

## 2020-08-22 NOTE — Progress Notes (Deleted)
no show

## 2020-08-23 ENCOUNTER — Ambulatory Visit: Payer: Managed Care, Other (non HMO) | Admitting: Cardiovascular Disease

## 2020-08-23 DIAGNOSIS — I10 Essential (primary) hypertension: Secondary | ICD-10-CM

## 2020-08-23 DIAGNOSIS — I480 Paroxysmal atrial fibrillation: Secondary | ICD-10-CM

## 2020-08-23 DIAGNOSIS — R079 Chest pain, unspecified: Secondary | ICD-10-CM

## 2020-08-23 DIAGNOSIS — M7989 Other specified soft tissue disorders: Secondary | ICD-10-CM

## 2020-08-23 DIAGNOSIS — D649 Anemia, unspecified: Secondary | ICD-10-CM

## 2020-08-23 LAB — SARS CORONAVIRUS 2 (TAT 6-24 HRS): SARS Coronavirus 2: NEGATIVE

## 2020-08-24 ENCOUNTER — Ambulatory Visit: Payer: Managed Care, Other (non HMO) | Admitting: Nurse Practitioner

## 2020-08-25 ENCOUNTER — Other Ambulatory Visit: Payer: Self-pay | Admitting: Gastroenterology

## 2020-08-25 ENCOUNTER — Encounter: Payer: Self-pay | Admitting: Cardiovascular Disease

## 2020-08-25 DIAGNOSIS — K219 Gastro-esophageal reflux disease without esophagitis: Secondary | ICD-10-CM

## 2020-09-09 ENCOUNTER — Telehealth: Payer: Self-pay | Admitting: Cardiovascular Disease

## 2020-09-09 NOTE — Telephone Encounter (Signed)
  Patient Consent for Virtual Visit         [name] [name] has provided verbal consent on 09/09/2020 for a virtual visit (video or telephone).   CONSENT FOR VIRTUAL VISIT FOR:  [name] [name]  By participating in this virtual visit I agree to the following:  I hereby voluntarily request, consent and authorize [place] and its employed or contracted physicians, physician assistants, nurse practitioners or other licensed health care professionals (the Practitioner), to provide me with telemedicine health care services (the "Services") as deemed necessary by the treating Practitioner. I acknowledge and consent to receive the Services by the Practitioner via telemedicine. I understand that the telemedicine visit will involve communicating with the Practitioner through live audiovisual communication technology and the disclosure of certain medical information by electronic transmission. I acknowledge that I have been given the opportunity to request an in-person assessment or other available alternative prior to the telemedicine visit and am voluntarily participating in the telemedicine visit.  I understand that I have the right to withhold or withdraw my consent to the use of telemedicine in the course of my care at any time, without affecting my right to future care or treatment, and that the Practitioner or I may terminate the telemedicine visit at any time. I understand that I have the right to inspect all information obtained and/or recorded in the course of the telemedicine visit and may receive copies of available information for a reasonable fee.  I understand that some of the potential risks of receiving the Services via telemedicine include:  Marland Kitchen Delay or interruption in medical evaluation due to technological equipment failure or disruption; . Information transmitted may not be sufficient (e.g. poor resolution of images) to allow for appropriate medical decision making by the Practitioner;  and/or  . In rare instances, security protocols could fail, causing a breach of personal health information.  Furthermore, I acknowledge that it is my responsibility to provide information about my medical history, conditions and care that is complete and accurate to the best of my ability. I acknowledge that Practitioner's advice, recommendations, and/or decision may be based on factors not within their control, such as incomplete or inaccurate data provided by me or distortions of diagnostic images or specimens that may result from electronic transmissions. I understand that the practice of medicine is not an exact science and that Practitioner makes no warranties or guarantees regarding treatment outcomes. I acknowledge that a copy of this consent can be made available to me via my patient portal ([facility]), or I can request a printed copy by calling the office of [place].    I understand that my insurance will be billed for this visit.   I have read or had this consent read to me. . I understand the contents of this consent, which adequately explains the benefits and risks of the Services being provided via telemedicine.  . I have been provided ample opportunity to ask questions regarding this consent and the Services and have had my questions answered to my satisfaction. . I give my informed consent for the services to be provided through the use of telemedicine in my medical care

## 2020-09-10 ENCOUNTER — Encounter: Payer: Self-pay | Admitting: Cardiovascular Disease

## 2020-09-10 ENCOUNTER — Other Ambulatory Visit: Payer: Self-pay

## 2020-09-10 ENCOUNTER — Telehealth (INDEPENDENT_AMBULATORY_CARE_PROVIDER_SITE_OTHER): Payer: Managed Care, Other (non HMO) | Admitting: Cardiovascular Disease

## 2020-09-10 VITALS — Ht 64.0 in | Wt 200.0 lb

## 2020-09-10 DIAGNOSIS — F4310 Post-traumatic stress disorder, unspecified: Secondary | ICD-10-CM

## 2020-09-10 DIAGNOSIS — I480 Paroxysmal atrial fibrillation: Secondary | ICD-10-CM | POA: Diagnosis not present

## 2020-09-10 DIAGNOSIS — E782 Mixed hyperlipidemia: Secondary | ICD-10-CM

## 2020-09-10 NOTE — Progress Notes (Signed)
Virtual Visit via Telephone Note   This visit type was conducted due to national recommendations for restrictions regarding the COVID-19 Pandemic (e.g. social distancing) in an effort to limit this patient's exposure and mitigate transmission in our community.  Due to her co-morbid illnesses, this patient is at least at moderate risk for complications without adequate follow up.  This format is felt to be most appropriate for this patient at this time.  The patient did not have access to video technology/had technical difficulties with video requiring transitioning to audio format only (telephone).  All issues noted in this document were discussed and addressed.  No physical exam could be performed with this format.  Please refer to the patient's chart for her  consent to telehealth for [facility].   I connected with  [name] [name] on 09/10/20 by a video enabled telemedicine application and verified that I am speaking with the correct person using two identifiers. I am contacting the patient above from our cardiology clinic office or alternate office work station to their home, I discussed the limitations of evaluation and management by telemedicine. The patient expressed understanding and agreed to proceed.   Evaluation Performed:  Follow-up visit  Date:  09/10/2020   ID:  [name] [name], [name] [name] 10-18-1965, MRN [id number]  Patient Location:  [address] [place] [address]   Provider location:   [facility], [place] office  PCP:  Venita Lick, NP  Cardiologist:  Patsy Baltimore   Chief Complaint  Patient presents with   Follow-up    12 month F/U-No new cardiac concerns    History of Present Illness:    TALISE [name] [name] is a 55 y.o. female who presents via audio/video conferencing for a telehealth visit today.   The patient does not symptoms concerning for COVID-19 infection (fever, chills, cough, or new SHORTNESS OF BREATH).   Patient has a past  medical history of Ms. Kilgour is a 55 year old woman with history of GERD, presenting to the hospital 03/27/2013 with tachycardia, shortness of breath. Diagnosed with atrial fibrillation with RVR, heart rate 140s. She was started on Cardizem, beta blocker with improved heart rate control, converting to normal sinus rhythm with short runs of tachycardia. She was discharged on Cardizem 4 times a day, metoprolol 25 mg twice a day. She presents today for follow-up of her paroxysmal atrial fibrillation  Last seen by myself in 2019 Seen in our clinic September 2020 For palpitations Was using beta-blocker daily with flecainide as needed Has sleep apnea  On today's visit denies breakthrough tachycardia palpitations concerning for atrial fibrillation Not taking metoprolol or flecainide Metoprolol made her blood pressure dropped too low Blood pressure at home 120/80 Higher if she is stressed Weight trending higher, has a sitdown job, no regular exercise program  ZIO monitor , results reviewed Normal sinus rhythm Avg HR of 76 bpm.    Isolated SVEs were rare (<1.0%), SVE Couplets were rare (<1.0%), and no SVE Triplets were present. Isolated VEs were rare (<1.0%), VE Couplets were rare (<1.0%), and no VE Triplets were present. Ventricular Bigeminy and Trigeminy were present.  Patient triggered events were normal sinus rhythm, other triggered events were PVCs in bigeminal pattern   Other past medical history reviewed stopped all of her meds in May 2015,  She was doing well until October 2015, when she started having sx of fluttering, hard heart beats.  Echocardiogram August 2014 showing normal LV systolic function, LV diastolic relaxation  abnormality, normal right ventricular systolic pressure TSH in the hospital was normal, 1.9   Prior CV studies:   The following studies were reviewed today:    Past Medical History:  Diagnosis Date   Anxiety    Arthritis    B12 deficiency     Chicken pox    Depression    Fibroid    GERD (gastroesophageal reflux disease)    History of hiatal hernia    Hypertension    Iron deficiency anemia    Menorrhagia    Migraines    PAF (paroxysmal atrial fibrillation) (HCC)    a. 04/2013 Echo: EF 55-60%, impaired relaxation. Mild MR/TR. Nl RV fxn; b. CHA2DS2VASc = 2; c. PRN flecainide.   Snores    Past Surgical History:  Procedure Laterality Date   ABDOMINAL HYSTERECTOMY     APPENDECTOMY  5/11   C-spine surgery     CESAREAN SECTION  10/98,9/12   COLONOSCOPY WITH PROPOFOL N/A 03/26/2019   Procedure: COLONOSCOPY WITH PROPOFOL;  Surgeon: Pasty Spillers, MD;  Location: [facility] ENDOSCOPY;  Service: Endoscopy;  Laterality: N/A;   ESOPHAGOGASTRODUODENOSCOPY (EGD) WITH PROPOFOL N/A 03/26/2019   Procedure: ESOPHAGOGASTRODUODENOSCOPY (EGD) WITH PROPOFOL;  Surgeon: Pasty Spillers, MD;  Location: [facility] ENDOSCOPY;  Service: Endoscopy;  Laterality: N/A;   HYSTERECTOMY ABDOMINAL WITH SALPINGO-OOPHORECTOMY Bilateral 08/05/2018   Procedure: HYSTERECTOMY ABDOMINAL WITH BILATERAL SALPINGO-OOPHORECTOMY;  Surgeon: Herold Harms, MD;  Location: [facility] ORS;  Service: Gynecology;  Laterality: Bilateral;   TONSILLECTOMY  03/2008   TUBAL LIGATION        Allergies:   Phenylmercuric nitrate, Augmentin [amoxicillin-pot clavulanate], Codeine, Diclofenac, Latex, Neomycin, and Percocet [oxycodone-acetaminophen]   Social History   Tobacco Use   Smoking status: Former Smoker    Packs/day: 1.00    Years: 10.00    Pack years: 10.00    Types: Cigarettes    Quit date: 08/22/1991    Years since quitting: 29.0   Smokeless tobacco: Never Used  Vaping Use   Vaping Use: Never used  Substance Use Topics   Alcohol use: Not Currently    Alcohol/week: 0.0 standard drinks    Comment: twice a year    Drug use: Not Currently    Types: Marijuana    Comment: not for a couple years     Current Outpatient Medications on File Prior to  Visit  Medication Sig Dispense Refill   fluticasone (FLONASE) 50 MCG/ACT nasal spray Place 1 spray into both nostrils daily as needed for congestion.  0   ibuprofen (ADVIL) 200 MG tablet Takes 1-3 tablets as needed     lamoTRIgine (LAMICTAL) 200 MG tablet TAKE 1 TABLET BY MOUTH  EVERY NIGHT AT BEDTIME 90 tablet 3   LORazepam (ATIVAN) 1 MG tablet Take 1 tablet (1 mg total) by mouth every 8 (eight) hours. 30 tablet 3   Multiple Vitamin (MULTIVITAMIN WITH MINERALS) TABS tablet Take 1 tablet by mouth at bedtime.     omeprazole (PRILOSEC) 20 MG capsule TAKE 1 CAPSULE(20 MG) BY MOUTH DAILY 30 capsule 0   ondansetron (ZOFRAN ODT) 4 MG disintegrating tablet Take 1 tablet (4 mg total) by mouth every 8 (eight) hours as needed for nausea or vomiting. 20 tablet 0   Oxcarbazepine (TRILEPTAL) 300 MG tablet Take 3 tablets (900 mg total) by mouth at bedtime. 270 tablet 1   sertraline (ZOLOFT) 100 MG tablet TAKE 1 TABLET BY MOUTH  DAILY 90 tablet 3   No current facility-administered medications on file prior to visit.  Family Hx: The patient's family history includes Alcohol abuse in her father, maternal aunt, maternal uncle, and paternal grandfather; Arthritis in her father, maternal grandmother, and sister; Diabetes in her mother; Heart disease in her father; Leukemia in her maternal grandfather; Lung cancer in her paternal grandfather; Schizophrenia in her maternal grandmother. There is no history of Breast cancer.  ROS:   Please see the history of present illness.    Review of Systems  Constitutional: Negative.   HENT: Negative.   Respiratory: Negative.   Cardiovascular: Negative.   Gastrointestinal: Negative.   Musculoskeletal: Negative.   Neurological: Negative.   Psychiatric/Behavioral: Negative.   All other systems reviewed and are negative.    Labs/Other Tests and Data Reviewed:    Recent Labs: 05/24/2020: ALT 13; BUN 11; Creatinine, Ser 0.86; Potassium 4.0; Sodium 140; TSH  1.260 06/14/2020: Hemoglobin 13.9; Platelets 367   Recent Lipid Panel Lab Results  Component Value Date/Time   CHOL 217 (H) 03/07/2019 12:27 PM   CHOL 173 03/27/2013 05:00 AM   TRIG 126 03/07/2019 12:27 PM   TRIG 84 03/27/2013 05:00 AM   HDL 68 03/07/2019 12:27 PM   HDL 45 03/27/2013 05:00 AM   CHOLHDL 3.2 03/07/2019 12:27 PM   LDLCALC 124 (H) 03/07/2019 12:27 PM   LDLCALC 111 (H) 03/27/2013 05:00 AM    Wt Readings from Last 3 Encounters:  09/10/20 200 lb (90.7 kg)  08/22/20 200 lb (90.7 kg)  07/28/20 207 lb (93.9 kg)     Exam:    Vital Signs: Vital signs may also be detailed in the HPI Ht 5\' 4"  (1.626 m)    Wt 200 lb (90.7 kg)    LMP  (LMP Unknown)    BMI 34.33 kg/m   Wt Readings from Last 3 Encounters:  09/10/20 200 lb (90.7 kg)  08/22/20 200 lb (90.7 kg)  07/28/20 207 lb (93.9 kg)   Temp Readings from Last 3 Encounters:  08/22/20 98.5 F (36.9 C) (Oral)  07/28/20 97.6 F (36.4 C) (Oral)  06/14/20 97.9 F (36.6 C) (Oral)   BP Readings from Last 3 Encounters:  08/22/20 (!) 140/93  07/28/20 138/85  06/14/20 (!) 145/96   Pulse Readings from Last 3 Encounters:  08/22/20 80  07/28/20 86  06/14/20 68     Well nourished, well developed female in no acute distress. Constitutional:  oriented to person, place, and time. No distress.    ASSESSMENT & PLAN:    Problem List Items Addressed This Visit      Cardiology Problems   PAF (paroxysmal atrial fibrillation) (HCC) - Primary   HLD (hyperlipidemia)     Other   PTSD (post-traumatic stress disorder)     Paroxysmal atrial fibrillation No strong indication to start antiarrhythmic or other beta-blocker, not having significant symptoms As far as we know maintaining normal sinus rhythm No strong indication for anticoagulation at this time  Hyperlipidemia Numbers running mildly high, recommended dietary restriction, walking program, slow weight loss  PTSD Managed by primary care Feels things are  stable   COVID-19 Education: The signs and symptoms of COVID-19 were discussed with the patient and how to seek care for testing (follow up with PCP or arrange E-visit).  The importance of social distancing was discussed today.  Patient Risk:   After full review of this patients clinical status, I feel that they are at least moderate risk at this time.  Time:   Today, I have spent 25 minutes with the patient with telehealth technology discussing  the cardiac and medical problems/diagnoses detailed above   Additional 10 min spent reviewing the chart prior to patient visit today   Medication Adjustments/Labs and Tests Ordered: Current medicines are reviewed at length with the patient today.  Concerns regarding medicines are outlined above.   Tests Ordered: No tests ordered   Medication Changes: No changes made    Signed, Ida Rogue, MD  [place] [facility] [id number], [place], [place] [address]

## 2020-09-10 NOTE — Patient Instructions (Addendum)
Medication Instructions:  No changes-You currently are not on any cardiac medications that need refilling.   Lab work: No new labs needed  Testing/Procedures: No new testing needed   Follow-Up: At [facility], you and your health needs are our priority.  As part of our continuing mission to provide you with exceptional heart care, we have created designated Provider Care Teams.  These Care Teams include your primary Cardiologist (physician) and Advanced Practice Providers (APPs -  Physician Assistants and Nurse Practitioners) who all work together to provide you with the care you need, when you need it.   You will need a follow up appointment in 12 months   Providers on your designated Care Team:    Murray Hodgkins, NP  Christell Faith, PA-C  Marrianne Mood, PA-C  Any Other Special Instructions Will Be Listed Below (If Applicable).  COVID-19 Vaccine Information can be found at: [email or web address] For questions related to vaccine distribution or appointments, please email [email or web address] or call [phone].

## 2020-10-05 ENCOUNTER — Telehealth (INDEPENDENT_AMBULATORY_CARE_PROVIDER_SITE_OTHER): Payer: 59 | Admitting: Psychiatry

## 2020-10-05 ENCOUNTER — Encounter: Payer: Self-pay | Admitting: Psychiatry

## 2020-10-05 DIAGNOSIS — R5382 Chronic fatigue, unspecified: Secondary | ICD-10-CM

## 2020-10-05 DIAGNOSIS — F411 Generalized anxiety disorder: Secondary | ICD-10-CM

## 2020-10-05 DIAGNOSIS — R7989 Other specified abnormal findings of blood chemistry: Secondary | ICD-10-CM

## 2020-10-05 DIAGNOSIS — E538 Deficiency of other specified B group vitamins: Secondary | ICD-10-CM

## 2020-10-05 DIAGNOSIS — F4001 Agoraphobia with panic disorder: Secondary | ICD-10-CM

## 2020-10-05 DIAGNOSIS — F3162 Bipolar disorder, current episode mixed, moderate: Secondary | ICD-10-CM

## 2020-10-05 DIAGNOSIS — F4310 Post-traumatic stress disorder, unspecified: Secondary | ICD-10-CM | POA: Diagnosis not present

## 2020-10-05 DIAGNOSIS — F319 Bipolar disorder, unspecified: Secondary | ICD-10-CM

## 2020-10-05 DIAGNOSIS — F40240 Claustrophobia: Secondary | ICD-10-CM

## 2020-10-05 MED ORDER — LORAZEPAM 1 MG PO TABS: 1.0000 mg | ORAL_TABLET | Freq: Three times a day (TID) | ORAL | 3 refills | Status: DC

## 2020-10-05 MED ORDER — OXCARBAZEPINE 300 MG PO TABS: ORAL_TABLET | ORAL | 0 refills | Status: DC

## 2020-10-05 NOTE — Progress Notes (Signed)
[name] [name] [id number] 19-Nov-1965 55 y.o.   Video Visit via My Chart  I connected with pt by My Chart and verified that I am speaking with the correct person using two identifiers.   I discussed the limitations, risks, security and privacy concerns of performing an evaluation and management service by My Chart  and the availability of in person appointments. I also discussed with the patient that there may be a patient responsible charge related to this service. The patient expressed understanding and agreed to proceed.  I discussed the assessment and treatment plan with the patient. The patient was provided an opportunity to ask questions and all were answered. The patient agreed with the plan and demonstrated an understanding of the instructions.   The patient was advised to call back or seek an in-person evaluation if the symptoms worsen or if the condition fails to improve as anticipated.  I provided 30 minutes of video time during this encounter.  The patient was located at home and the provider was located office. Session started at 5 and ended 5:30 pm.  Subjective:   Patient ID:  [name] [name] is a 55 y.o. (DOB 11-05-1965) female.  Chief Complaint:  Chief Complaint  Patient presents with  . Bipolar I disorder with seasonal pattern ([place])  . Follow-up  . Anxiety  . Agitation    Depression        Associated symptoms include fatigue.  Associated symptoms include no decreased concentration and no suicidal ideas.  Past medical history includes anxiety.   Anxiety Symptoms include nervous/anxious behavior and palpitations. Patient reports no confusion, decreased concentration, dizziness or suicidal ideas.     [name] [name] presents to the office today for follow-up of several dxes.  At visit  January 2020.  She had recently been diagnosed with low vitamin B12.  She was initiating treatment.  No meds were changed.  visit July 2020.  her anxiety was higher after reducing  sertraline.  Therefore it was increased back to 100 mg daily.  06/2020 appt with the following noted: Cardiologist said she had chronic tx for afib and PVC's and it acts up more when she's under more stress.  Cardiologist suggested Bz when having the episodes.  Better at present but has periods of them daily.  They last an hour or more.  Rarely takes clonazepam, but it helps in 30 mins.  Tolerates clonazepam well otherwise. She felt increase sertraline helped some with anxiety.  It is better.  More irritable last couple of weeks than anxious unless heart episode.  Missed a couple of weeks of hormones which might have caused this. Panic attacks from 0-2 weekly is typical but episodes where she may have them daily for a couple of weeks associated with the palpitations as noted. Plan: DC clonazepam Start lorazepam 1 mg every 8 hours as needed anxiety attacks.  Call back if it is less effective than clonazepam or less well-tolerated.  04/06/20 appt with the following noted: Real stressed out and feels everything caving in on her.  Work, home and everything.  D broke her leg and ex H had to stay with them. Supervisor stress and short-staffed.  Doesn't want the vaccine. Only one RX lorazepam.  It helped.  But afraid of addiction.  Tends to isolate weekends bc stress. Consistent with oxcarb 900 mg HS, sertraline 100, lamotrigine 200. Wanting to stay up late and then has EMA.   Not hyper, never.  Is irritable.   ExH's family gives  her a hard time and drags D into it. Plan no med changes:  10/05/2020 appointment with the following noted: Some anger and anxiety but probably situational over the Covid situation and dealing with work situation.  Wakes that way a lot of times.  Irritable.  Walked out of work one day.  In the background all the time but will come out with triggers.  Controls it usually.  No panic.  Patient reports stable mood and denies depressed or irritable moods other than mild fluctuations.   No mania.   More anger and irritability.   Patient denies difficulty with sleep initiation or maintenance. Denies appetite disturbance.  Patient reports that energy and motivation have been good.  She still has complaints of concentration and forgetfulness as previously noted.  Patient denies any suicidal ideation.  Still forgetful.   Was told she had low b12, still working on getting it up.  Getting shots.  hysterectomy for endometriosis and normally takes a patch distributing hormones though she is recently been out and it worsened her mood when she was not taking it..  Worried about hormones affecting mood.  Hx bad PMS.  Prior psychiatric medication trials include  sertraline,  Depakote,  lithium, lamotrigine, Trileptal 900, Saphris, risperidone,  Clonazepam.  Xanax SE, lorazepam 1.  Review of Systems:  Review of Systems  Constitutional: Positive for fatigue. Negative for fever.  Cardiovascular: Positive for palpitations.  Neurological: Negative for dizziness, tremors and weakness.  Psychiatric/Behavioral: Positive for agitation and depression. Negative for behavioral problems, confusion, decreased concentration, dysphoric mood, hallucinations, self-injury, sleep disturbance and suicidal ideas. The patient is nervous/anxious. The patient is not hyperactive.     Medications: I have reviewed the patient's current medications.  Current Outpatient Medications  Medication Sig Dispense Refill  . fluticasone (FLONASE) 50 MCG/ACT nasal spray Place 1 spray into both nostrils daily as needed for congestion.  0  . ibuprofen (ADVIL) 200 MG tablet Takes 1-3 tablets as needed    . lamoTRIgine (LAMICTAL) 200 MG tablet TAKE 1 TABLET BY MOUTH  EVERY NIGHT AT BEDTIME 90 tablet 3  . LORazepam (ATIVAN) 1 MG tablet Take 1 tablet (1 mg total) by mouth every 8 (eight) hours. 30 tablet 3  . Multiple Vitamin (MULTIVITAMIN WITH MINERALS) TABS tablet Take 1 tablet by mouth at bedtime.    . Oxcarbazepine  (TRILEPTAL) 300 MG tablet Take 3 tablets (900 mg total) by mouth at bedtime. 270 tablet 1  . sertraline (ZOLOFT) 100 MG tablet TAKE 1 TABLET BY MOUTH  DAILY 90 tablet 3  . omeprazole (PRILOSEC) 20 MG capsule TAKE 1 CAPSULE(20 MG) BY MOUTH DAILY (Patient not taking: Reported on 10/05/2020) 30 capsule 0  . ondansetron (ZOFRAN ODT) 4 MG disintegrating tablet Take 1 tablet (4 mg total) by mouth every 8 (eight) hours as needed for nausea or vomiting. (Patient not taking: Reported on 10/05/2020) 20 tablet 0   No current facility-administered medications for this visit.    Medication Side Effects: None  Allergies:  Allergies  Allergen Reactions  . Phenylmercuric Nitrate Rash  . Augmentin [Amoxicillin-Pot Clavulanate] Nausea Only    Has patient had a PCN reaction causing immediate rash, facial/tongue/throat swelling, SOB or lightheadedness with hypotension: No Has patient had a PCN reaction causing severe rash involving mucus membranes or skin necrosis: No Has patient had a PCN reaction that required hospitalization: No Has patient had a PCN reaction occurring within the last 10 years: Yes If all of the above answers are "NO", then may proceed  with Cephalosporin use.   . Codeine Nausea Only  . Diclofenac Nausea Only  . Latex Rash  . Neomycin Rash  . Percocet [Oxycodone-Acetaminophen] Rash    Hives     Past Medical History:  Diagnosis Date  . Anxiety   . Arthritis   . B12 deficiency   . Chicken pox   . Depression   . Fibroid   . GERD (gastroesophageal reflux disease)   . History of hiatal hernia   . Hypertension   . Iron deficiency anemia   . Menorrhagia   . Migraines   . PAF (paroxysmal atrial fibrillation) ([place])    a. 04/2013 Echo: EF 55-60%, impaired relaxation. Mild MR/TR. Nl RV fxn; b. CHA2DS2VASc = 2; c. PRN flecainide.  . Snores     Family History  Problem Relation Age of Onset  . Diabetes Mother   . Alcohol abuse Father   . Arthritis Father   . Heart disease Father    . Arthritis Sister   . Alcohol abuse Maternal Aunt   . Alcohol abuse Maternal Uncle   . Alcohol abuse Paternal Grandfather   . Lung cancer Paternal Grandfather        smoker   . Arthritis Maternal Grandmother   . Schizophrenia Maternal Grandmother   . Leukemia Maternal Grandfather   . Breast cancer Neg Hx     Social History   Socioeconomic History  . Marital status: Legally Separated    Spouse name: Not on file  . Number of children: Not on file  . Years of education: Not on file  . Highest education level: Not on file  Occupational History  . Not on file  Tobacco Use  . Smoking status: Former Smoker    Packs/day: 1.00    Years: 10.00    Pack years: 10.00    Types: Cigarettes    Quit date: 08/22/1991    Years since quitting: 29.1  . Smokeless tobacco: Never Used  Vaping Use  . Vaping Use: Never used  Substance and Sexual Activity  . Alcohol use: Not Currently    Alcohol/week: 0.0 standard drinks    Comment: twice a year   . Drug use: Not Currently    Types: Marijuana    Comment: not for a couple years  . Sexual activity: Yes    Partners: Male    Birth control/protection: Surgical    Comment: Husband  Other Topics Concern  . Not on file  Social History Narrative   [employer]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    Lives with husband and 2 children    Son- 47 yo and daughter 43 yo    Pets: 2 dogs    Caffeine- 2 sodas 20 oz, coffee 1 cup occasionally    Enjoys being outside with family       DPR mom Horton Chin [phone]    Social Determinants of Health   Financial Resource Strain: Not on file  Food Insecurity: Not on file  Transportation Needs: Not on file  Physical Activity: Not on file  Stress: Not on file  Social Connections: Not on file  Intimate Partner Violence: Not on file    Past Medical History, Surgical history, Social history, and Family history were reviewed and updated as appropriate.   Please see review of systems for further details on  the patient's review from today.   Objective:   Physical Exam:  LMP  (LMP Unknown)   Physical Exam Neurological:  Mental Status: She is alert and oriented to person, place, and time.     Cranial Nerves: No dysarthria.  Psychiatric:        Attention and Perception: Attention and perception normal.        Mood and Affect: Mood is anxious. Mood is not depressed.        Speech: Speech normal.        Behavior: Behavior is cooperative.        Thought Content: Thought content normal. Thought content is not paranoid or delusional. Thought content does not include homicidal or suicidal ideation. Thought content does not include homicidal or suicidal plan.        Cognition and Memory: Cognition and memory normal.        Judgment: Judgment normal.     Comments: Insight intact Primary psych symptom is irritability.  This is been gone ongoing and consistent for a while.     Lab Review:     Component Value Date/Time   NA 140 05/24/2020 1701   NA 138 11/25/2014 2152   K 4.0 05/24/2020 1701   K 3.7 11/25/2014 2152   CL 99 05/24/2020 1701   CL 105 11/25/2014 2152   CO2 23 05/24/2020 1701   CO2 28 11/25/2014 2152   GLUCOSE 97 05/24/2020 1701   GLUCOSE 108 (H) 12/26/2016 0812   GLUCOSE 98 11/25/2014 2152   BUN 11 05/24/2020 1701   BUN 10 11/25/2014 2152   CREATININE 0.86 05/24/2020 1701   CREATININE 0.81 11/25/2014 2152   CALCIUM 10.3 (H) 05/24/2020 1701   CALCIUM 10.0 11/25/2014 2152   PROT 6.9 05/24/2020 1701   PROT 7.2 11/25/2014 2152   ALBUMIN 4.6 05/24/2020 1701   ALBUMIN 4.1 11/25/2014 2152   AST 13 05/24/2020 1701   AST 15 11/25/2014 2152   ALT 13 05/24/2020 1701   ALT 13 (L) 11/25/2014 2152   ALKPHOS 108 05/24/2020 1701   ALKPHOS 61 11/25/2014 2152   BILITOT 0.2 05/24/2020 1701   BILITOT 0.4 11/25/2014 2152   GFRNONAA 78 05/24/2020 1701   GFRNONAA >60 11/25/2014 2152   GFRAA 90 05/24/2020 1701   GFRAA >60 11/25/2014 2152       Component Value Date/Time   WBC  6.4 06/14/2020 1625   WBC 4.9 07/30/2018 1033   RBC 4.83 06/14/2020 1625   RBC 4.82 07/30/2018 1033   HGB 13.9 06/14/2020 1625   HCT 41.7 06/14/2020 1625   PLT 367 06/14/2020 1625   MCV 86 06/14/2020 1625   MCV 77 (L) 11/25/2014 2152   MCH 28.8 06/14/2020 1625   MCH 22.4 (L) 07/30/2018 1033   MCHC 33.3 06/14/2020 1625   MCHC 29.8 (L) 07/30/2018 1033   RDW 13.2 06/14/2020 1625   RDW 22.4 (H) 11/25/2014 2152   LYMPHSABS 2.3 05/24/2020 1701   LYMPHSABS 2.4 03/27/2013 0500   MONOABS 0.4 07/30/2018 1033   MONOABS 0.4 03/27/2013 0500   EOSABS 0.1 05/24/2020 1701   EOSABS 0.2 03/27/2013 0500   BASOSABS 0.0 05/24/2020 1701   BASOSABS 0.0 03/27/2013 0500    No results found for: POCLITH, LITHIUM   No results found for: PHENYTOIN, PHENOBARB, VALPROATE, CBMZ   .res Assessment: Plan:    Amarria was seen today for bipolar i disorder with seasonal pattern ([facility]cc), follow-up, anxiety and agitation.  Diagnoses and all orders for this visit:  Bipolar 1 disorder, mixed, moderate (HCC)  Panic disorder with agoraphobia  PTSD (post-traumatic stress disorder)  Generalized anxiety disorder  Claustrophobia  B12  deficiency  Low vitamin D level  Chronic fatigue   Greater than 50% of 30 minutes of non-face to face time with patient was spent on counseling and coordination of care. We discussed Shalita has a history of bipolar disorder with heavy seasonal component .  Her mood is been fairly stable since she is was here last.  She has some mild and occasionally moderate irritability.  Her anxiety is improved since increasing sertraline however she still has episodes that appear to be mild panic attacks associated with palpitations.  This is of real concern because she does have underlying cardiology problems.  Cardiologist has suggested as needed benzo to help reduce her cardiac symptoms.   We switch to lorazepam to see if it works faster.  She was informed to let us know if it does not work.   She also needs to let us know if these episodes become daily again for more than 2 weeks in which case we need to consider adjustment of the SSRI to prevent these episodes.  She previously had exacerbation of anxiety when she attempted to wean off sertraline.  Increase Trileptal to 300 mg AM & 900 mg PM.  If fails consider Latuda or Vraylar.  Disc mood issues and estrogen and progesterone and risks with and without.   Low B12 and vitamin D can contribute to psych problems and can be consequence of AEDs.  We discussed the short-term risks associated with benzodiazepines including sedation and increased fall risk among others.  Discussed long-term side effect risk including dependence, potential withdrawal symptoms, and the potential eventual dose-related risk of dementia.  Disc risk dependence in detail in terms of the amount that can be used without it.  This appointment was 30 minutes  FU 6 weeks  Lynder Parents, MD, DFAPA    Please see After Visit Summary for patient specific instructions.  No future appointments.  No orders of the defined types were placed in this encounter.     -------------------------------

## 2020-10-20 ENCOUNTER — Telehealth: Payer: Self-pay | Admitting: Psychiatry

## 2020-10-20 NOTE — Telephone Encounter (Signed)
Pt called and said that she has questions about the FMLA she is getting ready to download and send to [place]. Please give her a call at [phone]. She just had a telehealth visit with dr. Clovis Pu last week

## 2020-10-20 NOTE — Telephone Encounter (Signed)
Rtc to patient and she reports feeling overwhelmed and stressed at work causing increased anxiety. She reports "going off" on her boss Friday. She reports there is someone at work going around telling everyone that patient is bipolar. [name]. She doesn't know how she would know. [name]. I asked if this was discussed at her last visit on 10/05/2020 and she said no. We discussed what she is thinking she needs, she really wants to see what Dr. Clovis Pu thinks and get his opinion. She reports she thinks she needs 2 weeks off to get to feeling better and remove herself from work for right now. Then intermittent from that time. Informed her that is something he would probably do but I would discuss with him.  Advised her to go ahead and get the [facility] paperwork sent over.

## 2020-10-21 NOTE — Telephone Encounter (Signed)
Get her on cancellation list.  OK on requested time off for FMLA

## 2020-11-10 ENCOUNTER — Other Ambulatory Visit: Payer: Self-pay

## 2020-11-10 ENCOUNTER — Ambulatory Visit
Admission: EM | Admit: 2020-11-10 | Discharge: 2020-11-10 | Disposition: A | Discharge status: Home / Self Care | Payer: Managed Care, Other (non HMO) | Attending: Family Medicine | Admitting: Family Medicine

## 2020-11-10 DIAGNOSIS — J029 Acute pharyngitis, unspecified: Secondary | ICD-10-CM | POA: Diagnosis not present

## 2020-11-10 DIAGNOSIS — J069 Acute upper respiratory infection, unspecified: Secondary | ICD-10-CM

## 2020-11-10 LAB — POCT RAPID STREP A (OFFICE): Rapid Strep A Screen: NEGATIVE

## 2020-11-10 MED ORDER — BENZONATATE 100 MG PO CAPS: 100.0000 mg | ORAL_CAPSULE | Freq: Three times a day (TID) | ORAL | 0 refills | Status: DC

## 2020-11-10 NOTE — Discharge Instructions (Addendum)
Recommend mucinex  Tessalon pearls as needed for cough Rest, hydrate  Follow up as needed for continued or worsening symptoms

## 2020-11-10 NOTE — ED Provider Notes (Signed)
Roderic Palau    CSN: [id number] Arrival date & time: 11/10/20  0855      History   Chief Complaint Chief Complaint  Patient presents with  . Cough  . Sore Throat    HPI DEMPSEY KNOTEK is a 55 y.o. female.   Pt is a 55 year old female that presents with productive cough with green sputum, congestion, HA, nausea for approx 4 days. States sore throat onset Saturday. Diarrhea on Sunday, now resolved. Reports feelings of mild SOB at night with increased coughing.Two home COVID tests on Sunday and Monday, both negative. Denies fever, vomiting, ear pain. Took Nyquil, ibuprofen over the weekend. Took tessalon pearl last night with some improvement to cough. Pt here today b/c she is concerned for poss strep throat.   Cough Sore Throat    Past Medical History:  Diagnosis Date  . Anxiety   . Arthritis   . B12 deficiency   . Chicken pox   . Depression   . Fibroid   . GERD (gastroesophageal reflux disease)   . History of hiatal hernia   . Hypertension   . Iron deficiency anemia   . Menorrhagia   . Migraines   . PAF (paroxysmal atrial fibrillation) ([place])    a. 04/2013 Echo: EF 55-60%, impaired relaxation. Mild MR/TR. Nl RV fxn; b. CHA2DS2VASc = 2; c. PRN flecainide.  . Snores     Patient Active Problem List   Diagnosis Date Noted  . Generalized abdominal pain 05/24/2020  . Gastric polyp   . Esophageal dysphagia   . Polyp of sigmoid colon   . Hypertrophy of anal papillae   . Diverticulosis of large intestine without diverticulitis   . HLD (hyperlipidemia) 03/12/2019  . Vitamin D deficiency 03/12/2019  . History of IBS 03/06/2019  . Pure hypertriglyceridemia 02/27/2019  . Menopause 02/27/2019  . Uterus, adenomyosis 08/07/2018  . S/P total hysterectomy and BSO (bilateral salpingo-oophorectomy) 08/05/2018  . B12 deficiency 06/04/2018  . PTSD (post-traumatic stress disorder) 05/14/2018  . HSV (herpes simplex virus) infection 11/27/2017  . Anemia 03/08/2017  .  HNP (herniated nucleus pulposus), cervical 07/29/2015  . Cervical radiculitis 04/22/2015  . Bipolar I disorder, most recent episode depressed ([place]) 12/10/2014  . GERD (gastroesophageal reflux disease) 12/10/2014  . Migraines 12/10/2014  . PAF (paroxysmal atrial fibrillation) ([place]) 04/10/2013    Past Surgical History:  Procedure Laterality Date  . ABDOMINAL HYSTERECTOMY    . APPENDECTOMY  5/11  . C-spine surgery    . CESAREAN SECTION  10/98,9/12  . COLONOSCOPY WITH PROPOFOL N/A 03/26/2019   Procedure: COLONOSCOPY WITH PROPOFOL;  Surgeon: Virgel Manifold, MD;  Location: [facility] END[facility]COPY;  Service: Endoscopy;  Laterality: N/A;  . ESOPHAGOGASTRODUODENOSCOPY (EGD) WITH PROPOFOL N/A 03/26/2019   Procedure: ESOPHAGOGASTRODUODENOSCOPY (EGD) WITH PROPOFOL;  Surgeon: Virgel Manifold, MD;  Location: [facility] ENDOSCOPY;  Service: Endoscopy;  Laterality: N/A;  . HYSTERECTOMY ABDOMINAL WITH SALPINGO-OOPHORECTOMY Bilateral 08/05/2018   Procedure: HYSTERECTOMY ABDOMINAL WITH BILATERAL SALPINGO-OOPHORECTOMY;  Surgeon: Brayton Mars, MD;  Location: [facility] ORS;  Service: Gynecology;  Laterality: Bilateral;  . TONSILLECTOMY  03/2008  . TUBAL LIGATION      OB History    Gravida  5   Para  3   Term  2   Preterm  1   AB  2   Living  2     SAB      IAB  2   Ectopic      Multiple      Live  Births  2        Obstetric Comments  1st Menstrual Cycle:  16 1st Pregnancy:  17         Home Medications    Prior to Admission medications   Medication Sig Start Date End Date Taking? Authorizing Provider  benzonatate (TESSALON) 100 MG capsule Take 1 capsule (100 mg total) by mouth every 8 (eight) hours. 11/10/20  Yes Zanylah Hardie A, NP  fluticasone (FLONASE) 50 MCG/ACT nasal spray Place 1 spray into both nostrils daily as needed for congestion. 07/22/18  Yes [provider]  ibuprofen (ADVIL) 200 MG tablet Takes 1-3 tablets as needed   Yes [provider]  lamoTRIgine  (LAMICTAL) 200 MG tablet TAKE 1 TABLET BY MOUTH  EVERY NIGHT AT BEDTIME 06/28/20  Yes Cottle, Billey Co., MD  omeprazole (PRILOSEC) 20 MG capsule TAKE 1 CAPSULE(20 MG) BY MOUTH DAILY 08/25/20  Yes Virgel Manifold, MD  Oxcarbazepine (TRILEPTAL) 300 MG tablet 1 tablet in the morning and 3 tablets at night 10/05/20  Yes Cottle, Billey Co., MD  sertraline (ZOLOFT) 100 MG tablet TAKE 1 TABLET BY MOUTH  DAILY 06/21/20  Yes Cottle, Billey Co., MD  LORazepam (ATIVAN) 1 MG tablet Take 1 tablet (1 mg total) by mouth every 8 (eight) hours. 10/05/20   Cottle, Billey Co., MD  Multiple Vitamin (MULTIVITAMIN WITH MINERALS) TABS tablet Take 1 tablet by mouth at bedtime.    [provider]  ondansetron (ZOFRAN ODT) 4 MG disintegrating tablet Take 1 tablet (4 mg total) by mouth every 8 (eight) hours as needed for nausea or vomiting. Patient not taking: Reported on 10/05/2020 08/22/20   Coral Spikes, DO    Family History Family History  Problem Relation Age of Onset  . Diabetes Mother   . Alcohol abuse Father   . Arthritis Father   . Heart disease Father   . Arthritis Sister   . Alcohol abuse Maternal Aunt   . Alcohol abuse Maternal Uncle   . Alcohol abuse Paternal Grandfather   . Lung cancer Paternal Grandfather        smoker   . Arthritis Maternal Grandmother   . Schizophrenia Maternal Grandmother   . Leukemia Maternal Grandfather   . Breast cancer Neg Hx     Social History Social History   Tobacco Use  . Smoking status: Former Smoker    Packs/day: 1.00    Years: 10.00    Pack years: 10.00    Types: Cigarettes    Quit date: 08/22/1991    Years since quitting: 29.2  . Smokeless tobacco: Never Used  Vaping Use  . Vaping Use: Never used  Substance Use Topics  . Alcohol use: Not Currently    Alcohol/week: 0.0 standard drinks    Comment: twice a year   . Drug use: Not Currently    Types: Marijuana    Comment: not for a couple years     Allergies   Phenylmercuric nitrate,  Augmentin [amoxicillin-pot clavulanate], Codeine, Diclofenac, Latex, Neomycin, and Percocet [oxycodone-acetaminophen]   Review of Systems Review of Systems  Respiratory: Positive for cough.      Physical Exam Triage Vital Signs ED Triage Vitals  Enc Vitals Group     BP 11/10/20 0912 132/83     Pulse Rate 11/10/20 0912 77     Resp 11/10/20 0912 18     Temp 11/10/20 0912 98 F (36.7 C)     Temp Source 11/10/20 0912 Oral  SpO2 11/10/20 0912 98 %     Weight --      Height --      Head Circumference --      Peak Flow --      Pain Score 11/10/20 0908 4     Pain Loc --      Pain Edu? --      Excl. in [place]? --    No data found.  Updated Vital Signs BP 132/83 (BP Location: Left Arm)   Pulse 77   Temp 98 F (36.7 C) (Oral)   Resp 18   LMP  (LMP Unknown)   SpO2 98%   Visual Acuity Right Eye Distance:   Left Eye Distance:   Bilateral Distance:    Right Eye Near:   Left Eye Near:    Bilateral Near:     Physical Exam Vitals and nursing note reviewed.  Constitutional:      General: She is not in acute distress.    Appearance: Normal appearance. She is not ill-appearing, toxic-appearing or diaphoretic.  HENT:     Head: Normocephalic.     Right Ear: Tympanic membrane and ear canal normal.     Left Ear: Tympanic membrane and ear canal normal.     Nose: Nose normal.     Mouth/Throat:     Pharynx: Oropharynx is clear. Posterior oropharyngeal erythema present.  Eyes:     Conjunctiva/sclera: Conjunctivae normal.  Cardiovascular:     Rate and Rhythm: Normal rate and regular rhythm.     Heart sounds: Normal heart sounds.  Pulmonary:     Effort: Pulmonary effort is normal.     Breath sounds: Normal breath sounds.  Musculoskeletal:        General: Normal range of motion.     Cervical back: Normal range of motion.  Skin:    General: Skin is warm and dry.     Findings: No rash.  Neurological:     Mental Status: She is alert.  Psychiatric:        Mood and Affect:  Mood normal.      UC Treatments / Results  Labs (all labs ordered are listed, but only abnormal results are displayed) Labs Reviewed  POCT RAPID STREP A (OFFICE)    EKG   Radiology No results found.  Procedures Procedures (including critical care time)  Medications Ordered in UC Medications - No data to display  Initial Impression / Assessment and Plan / UC Course  I have reviewed the triage vital signs and the nursing notes.  Pertinent labs & imaging results that were available during my care of the patient were reviewed by me and considered in my medical decision making (see chart for details).     Viral URI with cough Recommend Mucinex.  Tessalon Perles for cough as needed.  Rest, hydrate. Follow up as needed for continued or worsening symptoms  Final Clinical Impressions(s) / UC Diagnoses   Final diagnoses:  Viral URI with cough     Discharge Instructions     Recommend mucinex  Tessalon pearls as needed for cough Rest, hydrate  Follow up as needed for continued or worsening symptoms     ED Prescriptions    Medication Sig Dispense Auth. Provider   benzonatate (TESSALON) 100 MG capsule Take 1 capsule (100 mg total) by mouth every 8 (eight) hours. 21 capsule Maxwell Martorano A, NP     PDMP not reviewed this encounter.   Orvan July, NP 11/10/20 [phone]

## 2020-11-10 NOTE — ED Triage Notes (Signed)
Pt c/o productive cough with green sputum, congestion, HA, nausea for approx 4 days. States sore throat onset Saturday. Diarrhea on Sunday, now resolved. Reports feelings of mild SOB at night with increased coughing.  Two home COVID tests on Sunday and Monday, both negative.  Denies fever, vomiting, ear pain.  Took Nyquil, ibuprofen over the weekend. Took tessalon pearl last night with some improvement to cough. Pt here today b/c she is concerned for poss strep throat. Bilateral lung sounds CTA

## 2020-11-22 ENCOUNTER — Other Ambulatory Visit: Payer: Self-pay | Admitting: Internal Medicine

## 2020-11-22 DIAGNOSIS — K219 Gastro-esophageal reflux disease without esophagitis: Secondary | ICD-10-CM

## 2020-11-23 ENCOUNTER — Telehealth: Payer: Self-pay | Admitting: Psychiatry

## 2020-11-23 NOTE — Telephone Encounter (Signed)
She will only speak to you Dr Clovis Pu.

## 2020-11-23 NOTE — Telephone Encounter (Signed)
Fia lm requesting a return call. She would like to discuss some things with only Dr Clovis Pu. # [id number] [phone].

## 2020-11-25 ENCOUNTER — Telehealth: Payer: Self-pay | Admitting: Cardiovascular Disease

## 2020-11-25 NOTE — Telephone Encounter (Signed)
Please review

## 2020-11-25 NOTE — Telephone Encounter (Signed)
Was able to reach out to pt regarding her BP, she reports her BP has elevated this morning and has a headache. Pt report she took of one of her old metoprolol tartrate 25mg  back from 2020 when she had as a PRN. Reports her BP was 184/101 this morning and after taking the old metoprolol it came down to 149/89, but HR was 43. She is not having any CP, shob, or dizziness. Pt reports headache and eye pain.  Advised pt the headache and eye pain could be r/t seasonal allergies, pollen is now out. Suggested reducing the headache to see if that help reduce her BP. Advised to take OTC Tylenol, drink water and go rest in a quite place w/o distraction for a period of time to see if that helps. If no improvement and BP is still elevated in the next day or two, call the clinic to have an appt schedule since pt is currently not on any BP medication. Pt verbalized understanding, thankful for the return call, otherwise all questions or concerns were address and no additional concerns at this time. Agreeable to plan, will call back for anything further.

## 2020-11-25 NOTE — Telephone Encounter (Signed)
Addendum to previous messages. Next appt is 11/29/20 at 1:30. She has called back and said that she is having trouble at work with her co-workers. She wants to check into a hospital but she told me she didn't have any suicide thoughts. She is under a lot of stress at work. Her best friend told someone at her employer that she has bipolar and co-workers are giving her a hard time because of the diagnosis. She said that she is still taking her meds. Her blood pressure when taken a couple of days ago was 184/101 and 148/88. She would like to be pointed in the right direction of what she needs to do? Her phone number is [phone]. She would like to speak to someone before her visit on Monday.

## 2020-11-25 NOTE — Telephone Encounter (Signed)
RTC  Message from staff: Addendum to previous messages. Next appt is 11/29/20 at 1:30. She has called back and said that she is having trouble at work with her co-workers. She wants to check into a hospital but she told me she didn't have any suicide thoughts. She is under a lot of stress at work. Her best friend told someone at her employer that she has bipolar and co-workers are giving her a hard time because of the diagnosis. She said that she is still taking her meds. Her blood pressure when taken a couple of days ago was 184/101 and 148/88. She would like to be pointed in the right direction of what she needs to do? Her phone number is [phone]. She would like to speak to someone before her visit on Monday.   Work stress with boss spreading info that she's bipolar.  Getting knit picked at work.  Did this to another friend who had bipolar disorder. Works [employer].  Talked to [place] yesterday.  Get a final warning writeup and feels it is unjust.  Working there since 2017 and all started in last 2 mos.    Was doing fine until this all started.  Feels discriminated against.  No SI.  Will see her Monday and start FMLA for a couple of weeks and we'll make med changes. She agrees to the plan.  Lynder Parents, MD, DFAPA

## 2020-11-25 NOTE — Telephone Encounter (Signed)
Pt c/o BP issue: STAT if pt c/o blurred vision, one-sided weakness or slurred speech  1. What are your last 5 BP readings?  4/7 184/101 4/6 139/92 4/5 140/89  2. Are you having any other symptoms (ex. Dizziness, headache, blurred vision, passed out)? Headache, eyes hurt   3. What is your BP issue? high

## 2020-11-29 ENCOUNTER — Encounter: Payer: Self-pay | Admitting: Psychiatry

## 2020-11-29 ENCOUNTER — Ambulatory Visit (INDEPENDENT_AMBULATORY_CARE_PROVIDER_SITE_OTHER): Payer: 59 | Admitting: Psychiatry

## 2020-11-29 ENCOUNTER — Other Ambulatory Visit: Payer: Self-pay

## 2020-11-29 DIAGNOSIS — F40240 Claustrophobia: Secondary | ICD-10-CM

## 2020-11-29 DIAGNOSIS — F411 Generalized anxiety disorder: Secondary | ICD-10-CM

## 2020-11-29 DIAGNOSIS — F4001 Agoraphobia with panic disorder: Secondary | ICD-10-CM

## 2020-11-29 DIAGNOSIS — F3162 Bipolar disorder, current episode mixed, moderate: Secondary | ICD-10-CM | POA: Diagnosis not present

## 2020-11-29 DIAGNOSIS — E538 Deficiency of other specified B group vitamins: Secondary | ICD-10-CM

## 2020-11-29 DIAGNOSIS — F4310 Post-traumatic stress disorder, unspecified: Secondary | ICD-10-CM

## 2020-11-29 DIAGNOSIS — R7989 Other specified abnormal findings of blood chemistry: Secondary | ICD-10-CM

## 2020-11-29 DIAGNOSIS — R5382 Chronic fatigue, unspecified: Secondary | ICD-10-CM

## 2020-11-29 NOTE — Progress Notes (Signed)
[name] [name] [id number] [birth year] 55 y.o.   Video Visit via My Chart  I connected with pt by My Chart and verified that I am speaking with the correct person using two identifiers.   I discussed the limitations, risks, security and privacy concerns of performing an evaluation and management service by My Chart  and the availability of in person appointments. I also discussed with the patient that there may be a patient responsible charge related to this service. The patient expressed understanding and agreed to proceed.  I discussed the assessment and treatment plan with the patient. The patient was provided an opportunity to ask questions and all were answered. The patient agreed with the plan and demonstrated an understanding of the instructions.   The patient was advised to call back or seek an in-person evaluation if the symptoms worsen or if the condition fails to improve as anticipated.  I provided 30 minutes of video time during this encounter.  The patient was located at home and the provider was located office. Session started at 5 and ended 5:30 pm.  Subjective:   Patient ID:  [name] [name] is a 55 y.o. (DOB 1966-02-07) female.  Chief Complaint:  Chief Complaint  Patient presents with  . Follow-up  . Bipolar 1 disorder, mixed, moderate ([place])  . Agitation  . Anxiety    Depression        Associated symptoms include fatigue.  Associated symptoms include no decreased concentration and no suicidal ideas.  Past medical history includes anxiety.   Anxiety Symptoms include nervous/anxious behavior and palpitations. Patient reports no confusion, decreased concentration, dizziness or suicidal ideas.     [name] [name] presents to the office today for follow-up of several dxes.  At visit  January 2020.  She had recently been diagnosed with low vitamin B12.  She was initiating treatment.  No meds were changed.  visit July 2020.  her anxiety was higher after reducing  sertraline.  Therefore it was increased back to 100 mg daily.  06/2020 appt with the following noted: Cardiologist said she had chronic tx for afib and PVC's and it acts up more when she's under more stress.  Cardiologist suggested Bz when having the episodes.  Better at present but has periods of them daily.  They last an hour or more.  Rarely takes clonazepam, but it helps in 30 mins.  Tolerates clonazepam well otherwise. She felt increase sertraline helped some with anxiety.  It is better.  More irritable last couple of weeks than anxious unless heart episode.  Missed a couple of weeks of hormones which might have caused this. Panic attacks from 0-2 weekly is typical but episodes where she may have them daily for a couple of weeks associated with the palpitations as noted. Plan: DC clonazepam Start lorazepam 1 mg every 8 hours as needed anxiety attacks.  Call back if it is less effective than clonazepam or less well-tolerated.  04/06/20 appt with the following noted: Real stressed out and feels everything caving in on her.  Work, home and everything.  D broke her leg and ex H had to stay with them. Supervisor stress and short-staffed.  Doesn't want the vaccine. Only one RX lorazepam.  It helped.  But afraid of addiction.  Tends to isolate weekends bc stress. Consistent with oxcarb 900 mg HS, sertraline 100, lamotrigine 200. Wanting to stay up late and then has EMA.   Not hyper, never.  Is irritable.   ExH's family gives her  a hard time and drags D into it. Plan no med changes:  10/05/2020 appointment with the following noted: Some anger and anxiety but probably situational over the Covid situation and dealing with work situation.  Wakes that way a lot of times.  Irritable.  Walked out of work one day.  In the background all the time but will come out with triggers.  Controls it usually.  No panic. Plan: Increase Trileptal to 300 mg AM & 900 mg PM.   10/20/20 TC Rtc to patient and she reports  feeling overwhelmed and stressed at work causing increased anxiety. She reports "going off" on her boss Friday. She reports there is someone at work going around telling everyone that patient is bipolar. [name]. She doesn't know how she would know. [name]. I asked if this was discussed at her last visit on 10/05/2020 and she said no. We discussed what she is thinking she needs, she really wants to see what Dr. Clovis Pu thinks and get his opinion. She reports she thinks she needs 2 weeks off to get to feeling better and remove herself from work for right now. Then intermittent from that time. Informed her that is something he would probably do but I would discuss with him. OK'd  FMLA  11/23/20 TC: Message from staff: Addendum to previous messages. Next appt is 11/29/20 at 1:30. She has called back and said that she is having trouble at work with her co-workers. She wants to check into a hospital but she told me she didn't have any suicide thoughts. She is under a lot of stress at work. Her best friend told someone at her employer that she has bipolar and co-workers are giving her a hard time because of the diagnosis. She said that she is still taking her meds. Her blood pressure when taken a couple of days ago was 184/101 and 148/88. She would like to be pointed in the right direction of what she needs to do? Her phone number is [phone]. She would like to speak to someone before her visit on Monday. MD TC with pt:  Work stress with boss spreading info that she's bipolar.  Getting knit picked at work.  Did this to another friend who had bipolar disorder. Works [employer].  Talked to [place] yesterday.  Get a final warning writeup and feels it is unjust.  Working there since 2017 and all started in last 2 mos.   Was doing fine until this all started.  Feels discriminated against.  No SI. Will see her Monday and  start FMLA for a couple of weeks and we'll make med changes. She agrees to the plan. Lynder Parents, MD, DFAPA  11/29/20 appt noted: Worked today.  Got new supervisor who's cleaning house.  She feels picked on.  This has made her mad and she has expressed anger at work and that's gotten her written up. I feel on edge all the time.  Also overwhelmed at home and problems with daughter as well.  Problems keeping things straight at home.  More anger and irritability.   Patient denies difficulty with sleep initiation or maintenance. Denies appetite disturbance.  Patient reports that energy and motivation have been good.  She still has complaints of concentration and forgetfulness as previously noted.  Patient denies any suicidal ideation.  Still forgetful.   Was told she had low b12, still working on getting  it up.  Getting shots.  hysterectomy for endometriosis and normally takes a patch distributing hormones though she is recently been out and it worsened her mood when she was not taking it..  Worried about hormones affecting mood.  Hx bad PMS.  Prior psychiatric medication trials include  sertraline,  Depakote,  lithium, lamotrigine, Trileptal 900, Saphris, risperidone,  Clonazepam.  Xanax SE, lorazepam 1.  Review of Systems:  Review of Systems  Constitutional: Positive for fatigue. Negative for fever.  Cardiovascular: Positive for palpitations.  Neurological: Negative for dizziness, tremors and weakness.  Psychiatric/Behavioral: Positive for agitation and depression. Negative for behavioral problems, confusion, decreased concentration, dysphoric mood, hallucinations, self-injury, sleep disturbance and suicidal ideas. The patient is nervous/anxious. The patient is not hyperactive.     Medications: I have reviewed the patient's current medications.  Current Outpatient Medications  Medication Sig Dispense Refill  . ibuprofen (ADVIL) 200 MG tablet Takes 1-3 tablets as needed    .  lamoTRIgine (LAMICTAL) 200 MG tablet TAKE 1 TABLET BY MOUTH  EVERY NIGHT AT BEDTIME 90 tablet 3  . LORazepam (ATIVAN) 1 MG tablet Take 1 tablet (1 mg total) by mouth every 8 (eight) hours. 30 tablet 3  . Multiple Vitamin (MULTIVITAMIN WITH MINERALS) TABS tablet Take 1 tablet by mouth at bedtime.    Marland Kitchen omeprazole (PRILOSEC) 20 MG capsule TAKE 1 CAPSULE(20 MG) BY MOUTH DAILY 30 capsule 0  . Oxcarbazepine (TRILEPTAL) 300 MG tablet 1 tablet in the morning and 3 tablets at night 360 tablet 0  . sertraline (ZOLOFT) 100 MG tablet TAKE 1 TABLET BY MOUTH  DAILY 90 tablet 3  . benzonatate (TESSALON) 100 MG capsule Take 1 capsule (100 mg total) by mouth every 8 (eight) hours. (Patient not taking: Reported on 11/29/2020) 21 capsule 0  . fluticasone (FLONASE) 50 MCG/ACT nasal spray Place 1 spray into both nostrils daily as needed for congestion. (Patient not taking: Reported on 11/29/2020)  0  . ondansetron (ZOFRAN ODT) 4 MG disintegrating tablet Take 1 tablet (4 mg total) by mouth every 8 (eight) hours as needed for nausea or vomiting. (Patient not taking: No sig reported) 20 tablet 0   No current facility-administered medications for this visit.    Medication Side Effects: None  Allergies:  Allergies  Allergen Reactions  . Phenylmercuric Nitrate Rash  . Augmentin [Amoxicillin-Pot Clavulanate] Nausea Only    Has patient had a PCN reaction causing immediate rash, facial/tongue/throat swelling, SOB or lightheadedness with hypotension: No Has patient had a PCN reaction causing severe rash involving mucus membranes or skin necrosis: No Has patient had a PCN reaction that required hospitalization: No Has patient had a PCN reaction occurring within the last 10 years: Yes If all of the above answers are "NO", then may proceed with Cephalosporin use.   . Codeine Nausea Only  . Diclofenac Nausea Only  . Latex Rash  . Neomycin Rash  . Percocet [Oxycodone-Acetaminophen] Rash    Hives     Past Medical  History:  Diagnosis Date  . Anxiety   . Arthritis   . B12 deficiency   . Chicken pox   . Depression   . Fibroid   . GERD (gastroesophageal reflux disease)   . History of hiatal hernia   . Hypertension   . Iron deficiency anemia   . Menorrhagia   . Migraines   . PAF (paroxysmal atrial fibrillation) ([place])    a. 04/2013 Echo: EF 55-60%, impaired relaxation. Mild MR/TR. Nl RV fxn; b. CHA2DS2VASc = 2;  c. PRN flecainide.  . Snores     Family History  Problem Relation Age of Onset  . Diabetes Mother   . Alcohol abuse Father   . Arthritis Father   . Heart disease Father   . Arthritis Sister   . Alcohol abuse Maternal Aunt   . Alcohol abuse Maternal Uncle   . Alcohol abuse Paternal Grandfather   . Lung cancer Paternal Grandfather        smoker   . Arthritis Maternal Grandmother   . Schizophrenia Maternal Grandmother   . Leukemia Maternal Grandfather   . Breast cancer Neg Hx     Social History   Socioeconomic History  . Marital status: Legally Separated    Spouse name: Not on file  . Number of children: Not on file  . Years of education: Not on file  . Highest education level: Not on file  Occupational History  . Not on file  Tobacco Use  . Smoking status: Former Smoker    Packs/day: 1.00    Years: 10.00    Pack years: 10.00    Types: Cigarettes    Quit date: 08/22/1991    Years since quitting: 29.2  . Smokeless tobacco: Never Used  Vaping Use  . Vaping Use: Never used  Substance and Sexual Activity  . Alcohol use: Not Currently    Alcohol/week: 0.0 standard drinks    Comment: twice a year   . Drug use: Not Currently    Types: Marijuana    Comment: not for a couple years  . Sexual activity: Yes    Partners: Male    Birth control/protection: Surgical    Comment: Husband  Other Topics Concern  . Not on file  Social History Narrative   [employer]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    Lives with husband and 2 children    Son- 75 yo and daughter 55 yo    Pets: 2 dogs     Caffeine- 2 sodas 20 oz, coffee 1 cup occasionally    Enjoys being outside with family       DPR mom Horton Chin [phone]    Social Determinants of Health   Financial Resource Strain: Not on file  Food Insecurity: Not on file  Transportation Needs: Not on file  Physical Activity: Not on file  Stress: Not on file  Social Connections: Not on file  Intimate Partner Violence: Not on file    Past Medical History, Surgical history, Social history, and Family history were reviewed and updated as appropriate.   Please see review of systems for further details on the patient's review from today.   Objective:   Physical Exam:  LMP  (LMP Unknown)   Physical Exam Neurological:     Mental Status: She is alert and oriented to person, place, and time.     Cranial Nerves: No dysarthria.  Psychiatric:        Attention and Perception: Attention and perception normal.        Mood and Affect: Mood is anxious. Mood is not depressed.        Speech: Speech normal.        Behavior: Behavior is cooperative.        Thought Content: Thought content normal. Thought content is not paranoid or delusional. Thought content does not include homicidal or suicidal ideation. Thought content does not include homicidal or suicidal plan.        Cognition and Memory: Cognition and memory normal.  Judgment: Judgment normal.     Comments: Insight intact Primary psych symptom is irritability.  This is been gone ongoing and consistent for a while.     Lab Review:     Component Value Date/Time   NA 140 05/24/2020 1701   NA 138 11/25/2014 2152   K 4.0 05/24/2020 1701   K 3.7 11/25/2014 2152   CL 99 05/24/2020 1701   CL 105 11/25/2014 2152   CO2 23 05/24/2020 1701   CO2 28 11/25/2014 2152   GLUCOSE 97 05/24/2020 1701   GLUCOSE 108 (H) 12/26/2016 0812   GLUCOSE 98 11/25/2014 2152   BUN 11 05/24/2020 1701   BUN 10 11/25/2014 2152   CREATININE 0.86 05/24/2020 1701   CREATININE 0.81  11/25/2014 2152   CALCIUM 10.3 (H) 05/24/2020 1701   CALCIUM 10.0 11/25/2014 2152   PROT 6.9 05/24/2020 1701   PROT 7.2 11/25/2014 2152   ALBUMIN 4.6 05/24/2020 1701   ALBUMIN 4.1 11/25/2014 2152   AST 13 05/24/2020 1701   AST 15 11/25/2014 2152   ALT 13 05/24/2020 1701   ALT 13 (L) 11/25/2014 2152   ALKPHOS 108 05/24/2020 1701   ALKPHOS 61 11/25/2014 2152   BILITOT 0.2 05/24/2020 1701   BILITOT 0.4 11/25/2014 2152   GFRNONAA 78 05/24/2020 1701   GFRNONAA >60 11/25/2014 2152   GFRAA 90 05/24/2020 1701   GFRAA >60 11/25/2014 2152       Component Value Date/Time   WBC 6.4 06/14/2020 1625   WBC 4.9 07/30/2018 1033   RBC 4.83 06/14/2020 1625   RBC 4.82 07/30/2018 1033   HGB 13.9 06/14/2020 1625   HCT 41.7 06/14/2020 1625   PLT 367 06/14/2020 1625   MCV 86 06/14/2020 1625   MCV 77 (L) 11/25/2014 2152   MCH 28.8 06/14/2020 1625   MCH 22.4 (L) 07/30/2018 1033   MCHC 33.3 06/14/2020 1625   MCHC 29.8 (L) 07/30/2018 1033   RDW 13.2 06/14/2020 1625   RDW 22.4 (H) 11/25/2014 2152   LYMPHSABS 2.3 05/24/2020 1701   LYMPHSABS 2.4 03/27/2013 0500   MONOABS 0.4 07/30/2018 1033   MONOABS 0.4 03/27/2013 0500   EOSABS 0.1 05/24/2020 1701   EOSABS 0.2 03/27/2013 0500   BASOSABS 0.0 05/24/2020 1701   BASOSABS 0.0 03/27/2013 0500    No results found for: POCLITH, LITHIUM   No results found for: PHENYTOIN, PHENOBARB, VALPROATE, CBMZ   .res Assessment: Plan:    Ameliya was seen today for follow-up, bipolar 1 disorder, mixed, moderate ([facility]), agitation and anxiety.  Diagnoses and all orders for this visit:  Bipolar 1 disorder, mixed, moderate ([facility])  Panic disorder with agoraphobia  PTSD (post-traumatic stress disorder)  Generalized anxiety disorder  Claustrophobia  B12 deficiency  Low vitamin D level  Chronic fatigue   Greater than 50% of 30 minutes of non-face to face time with patient was spent on counseling and coordination of care. We discussed Tyshawna has a  history of bipolar disorder with heavy seasonal component .  Her mood is been fairly stable since she is was here last.  She has some mild and occasionally moderate irritability.  Her anxiety is improved since increasing sertraline however she still has episodes that appear to be mild panic attacks associated with palpitations.  This is of real concern because she does have underlying cardiology problems.  Cardiologist has suggested as needed benzo to help reduce her cardiac symptoms.   We switch to lorazepam to see if it works faster.  She was  informed to let us know if it does not work.  She also needs to let us know if these episodes become daily again for more than 2 weeks in which case we need to consider adjustment of the SSRI to prevent these episodes.  She previously had exacerbation of anxiety when she attempted to wean off sertraline.  Increase Trileptal to 600 mg AM & 900 mg PM.  Call if no improvement next week If fails consider Latuda or Vraylar. Gave samples Vraylar as backup plan 1.5 mg daily.  Disc job problems.  She has been given a final warning at work.  We discussed the pros and cons of sharing mental health information with her employer.  She feels her job may be in jeopardy.  She was given a note which she can use at her discretion that says that she is under our medical care.  Part of her symptoms include irritability and that we are addressing that through medication changes.  There is no evidence of any dangerousness to herself or others.  Please take these matters into consideration.   Low B12 and vitamin D can contribute to psych problems and can be consequence of AEDs.  We discussed the short-term risks associated with benzodiazepines including sedation and increased fall risk among others.  Discussed long-term side effect risk including dependence, potential withdrawal symptoms, and the potential eventual dose-related risk of dementia.  Disc risk dependence in detail in terms  of the amount that can be used without it.  This appointment was 30 minutes  FU 6 weeks  Lynder Parents, MD, DFAPA    Please see After Visit Summary for patient specific instructions.  No future appointments.  No orders of the defined types were placed in this encounter.     -------------------------------

## 2020-11-30 NOTE — Telephone Encounter (Signed)
Patient calling  Would like to know if we can send in metoprolol 25 MG 1 a day to [facility] near [address] Her medication is expired

## 2020-12-01 ENCOUNTER — Other Ambulatory Visit: Payer: Self-pay

## 2020-12-01 NOTE — Telephone Encounter (Signed)
Please schedule office visit to discuss BP concerns per nurse note below. Thank you!

## 2020-12-01 NOTE — Telephone Encounter (Signed)
Please disregard previous message as appointment has already been scheduled. Thank you!

## 2020-12-01 NOTE — Telephone Encounter (Signed)
Reach out to pt regarding her request for metoprolol 25 mg QD, advised could not send in script at this time w/o an order from provider. As of virtual visit with Dr. Rockey Situ on 09/10/20 "No strong indication to start antiarrhythmic or other beta-blocker, not having significant symptoms" Pt was not on metoprolol at that time as well, no changes were made to her medication. Last known she was on metoprolol was Nov 2020.  Advised pt that she would need an appt in person to be evaluated regarding her HTN so a proper medication can be recommended to her. Pt verbalized understanding. Also, advised with her hx of migraines, it can contribute to HTN, if she can reduce her headache it may also reduce her BP, pt verbalized understanding, stated she thinks the "weather and pollen" is contributing to her migraines at this time. Was able to schedule her an appt with Laurann Montana, NP tomorrow, pt very grateful for the soon appt, will reframe from ED visit at this time, as she was headed there for her BP.  Appt 4/14 at 2:30pm

## 2020-12-02 ENCOUNTER — Encounter: Payer: Self-pay | Admitting: Family

## 2020-12-02 ENCOUNTER — Ambulatory Visit (INDEPENDENT_AMBULATORY_CARE_PROVIDER_SITE_OTHER): Payer: Managed Care, Other (non HMO) | Admitting: Family

## 2020-12-02 ENCOUNTER — Other Ambulatory Visit: Payer: Self-pay

## 2020-12-02 VITALS — BP 130/82 | HR 61 | Ht 65.0 in | Wt 202.0 lb

## 2020-12-02 DIAGNOSIS — R0789 Other chest pain: Secondary | ICD-10-CM

## 2020-12-02 DIAGNOSIS — K449 Diaphragmatic hernia without obstruction or gangrene: Secondary | ICD-10-CM

## 2020-12-02 DIAGNOSIS — I10 Essential (primary) hypertension: Secondary | ICD-10-CM

## 2020-12-02 DIAGNOSIS — I480 Paroxysmal atrial fibrillation: Secondary | ICD-10-CM | POA: Diagnosis not present

## 2020-12-02 DIAGNOSIS — K219 Gastro-esophageal reflux disease without esophagitis: Secondary | ICD-10-CM

## 2020-12-02 MED ORDER — LOSARTAN POTASSIUM 25 MG PO TABS: 12.5000 mg | ORAL_TABLET | Freq: Every day | ORAL | 2 refills | Status: DC

## 2020-12-02 NOTE — Progress Notes (Signed)
Office Visit    Patient Name: [name] [name] Date of Encounter: 12/02/2020  PCP:  Venita Lick, NP   [place]  Cardiologist:  Ida Rogue, MD  Advanced Practice Provider:  No care team member to display Electrophysiologist:  None   Chief Complaint    [name] [name] is a 55 y.o. female with a hx of PAF diagnosed in 2014, hypertension, iron deficiency anemia, GERD, anxiety, headaches, snoring, palpitations presents today for elevated blood pressure  Past Medical History    Past Medical History:  Diagnosis Date  . Anxiety   . Arthritis   . B12 deficiency   . Chicken pox   . Depression   . Fibroid   . GERD (gastroesophageal reflux disease)   . History of hiatal hernia   . Hypertension   . Iron deficiency anemia   . Menorrhagia   . Migraines   . PAF (paroxysmal atrial fibrillation) ([place])    a. 04/2013 Echo: EF 55-60%, impaired relaxation. Mild MR/TR. Nl RV fxn; b. CHA2DS2VASc = 2; c. PRN flecainide.  . Snores    Past Surgical History:  Procedure Laterality Date  . ABDOMINAL HYSTERECTOMY    . APPENDECTOMY  5/11  . C-spine surgery    . CESAREAN SECTION  10/98,9/12  . COLONOSCOPY WITH PROPOFOL N/A 03/26/2019   Procedure: COLONOSCOPY WITH PROPOFOL;  Surgeon: Virgel Manifold, MD;  Location: [facility] ENDOSCOPY;  Service: Endoscopy;  Laterality: N/A;  . ESOPHAGOGASTRODUODENOSCOPY (EGD) WITH PROPOFOL N/A 03/26/2019   Procedure: ESOPHAGOGASTRODUODENOSCOPY (EGD) WITH PROPOFOL;  Surgeon: Virgel Manifold, MD;  Location: [facility] ENDOSCOPY;  Service: Endoscopy;  Laterality: N/A;  . HYSTERECTOMY ABDOMINAL WITH SALPINGO-OOPHORECTOMY Bilateral 08/05/2018   Procedure: HYSTERECTOMY ABDOMINAL WITH BILATERAL SALPINGO-OOPHORECTOMY;  Surgeon: Brayton Mars, MD;  Location: [facility] ORS;  Service: Gynecology;  Laterality: Bilateral;  . TONSILLECTOMY  03/2008  . TUBAL LIGATION      Allergies  Allergies  Allergen Reactions  . Phenylmercuric Nitrate  Rash  . Augmentin [Amoxicillin-Pot Clavulanate] Nausea Only    Has patient had a PCN reaction causing immediate rash, facial/tongue/throat swelling, SOB or lightheadedness with hypotension: No Has patient had a PCN reaction causing severe rash involving mucus membranes or skin necrosis: No Has patient had a PCN reaction that required hospitalization: No Has patient had a PCN reaction occurring within the last 10 years: Yes If all of the above answers are "NO", then may proceed with Cephalosporin use.   . Codeine Nausea Only  . Diclofenac Nausea Only  . Latex Rash  . Neomycin Rash  . Percocet [Oxycodone-Acetaminophen] Rash    Hives     History of Present Illness    [name] [name] is a 55 y.o. female with a hx of PAF diagnosed in 2014, hypertension, iron deficiency anemia, GERD, anxiety, headaches, snoring, palpitations last seen 09/10/2020 via virtual visit.  Of note she was last seen in clinic 04/30/2019 and has been seen only virtually until today.  Her paroxysmal atrial fibrillation was initially managed with calcium blocker but had bradycardia resulting in discontinuation.  Later placed on low-dose metoprolol and flecainide.  Flecainide was initially prescribed 50 mg twice daily but in the absence of symptoms she was noted at clinic visit 04/2019 to only be taking as needed and not in many years.  She will resume her Synthroid 2020 with no evidence of recurrent atrial fibrillation.  Virtual visit 09/10/2020 noted breakthrough palpitations but not taking flecainide on metoprolol.  She contacted the office 11/25/2020  noting elevated blood pressure readings with reports of readings 140/89, 139/92, 184/101  She also noted a headache and eyes hurting.  She she taken a previous Rx for Metoprolol Tartrate with improvement in BP and bradycardia.   She presents today for follow-up. Tells me her blood pressure at home has been 184/101. Her BP cuff at home is an arm cuff. She attributes it with  headache. The headache is what prompts her to check her blood pressure. Tells me it feels like a "different" type of headache. She had an episode of chest pain last night which radiated across her back. Felt like someone squeezing. She was resting when this occurred while eating dinner.  She does have a hiatal hernia which causes her discomfort and wonders whether this is what caused it. Gets occasional lightheadedness that "feels like my head is floating above me" and endorses her BP will be high when that happens. Endorses work has been stressful recently.   EKGs/Labs/Other Studies Reviewed:   The following studies were reviewed today: Monitor 04/2019 Normal sinus rhythm Avg HR of 76 bpm.    Isolated SVEs were rare (<1.0%), SVE Couplets were rare (<1.0%), and no SVE Triplets were present. Isolated VEs were rare (<1.0%), VE Couplets were rare (<1.0%), and no VE Triplets were present. Ventricular Bigeminy and Trigeminy were present.   Patient triggered events were normal sinus rhythm, other triggered events were PVCs in bigeminal pattern   EKG:  EKG is  ordered today.  The ekg ordered today demonstrates NSR 61bpm with no acute ST/T wave changes.   Recent Labs: 05/24/2020: ALT 13; BUN 11; Creatinine, Ser 0.86; Potassium 4.0; Sodium 140; TSH 1.260 06/14/2020: Hemoglobin 13.9; Platelets 367  Recent Lipid Panel    Component Value Date/Time   CHOL 217 (H) 03/07/2019 1227   CHOL 173 03/27/2013 0500   TRIG 126 03/07/2019 1227   TRIG 84 03/27/2013 0500   HDL 68 03/07/2019 1227   HDL 45 03/27/2013 0500   CHOLHDL 3.2 03/07/2019 1227   VLDL 17 03/27/2013 0500   LDLCALC 124 (H) 03/07/2019 1227   LDLCALC 111 (H) 03/27/2013 0500     Home Medications   Current Meds  Medication Sig  . ibuprofen (ADVIL) 200 MG tablet Takes 1-3 tablets as needed  . lamoTRIgine (LAMICTAL) 200 MG tablet TAKE 1 TABLET BY MOUTH  EVERY NIGHT AT BEDTIME  . LORazepam (ATIVAN) 1 MG tablet Take 1 tablet (1 mg total) by  mouth every 8 (eight) hours.  . Multiple Vitamin (MULTIVITAMIN WITH MINERALS) TABS tablet Take 1 tablet by mouth at bedtime.  Marland Kitchen omeprazole (PRILOSEC) 20 MG capsule TAKE 1 CAPSULE(20 MG) BY MOUTH DAILY  . Oxcarbazepine (TRILEPTAL) 300 MG tablet 1 tablet in the morning and 3 tablets at night  . pantoprazole (PROTONIX) 40 MG tablet Take 1 tablet by mouth daily.  . sertraline (ZOLOFT) 100 MG tablet TAKE 1 TABLET BY MOUTH  DAILY     Review of Systems  All other systems reviewed and are otherwise negative except as noted above.  Physical Exam    VS:  BP 130/82 (BP Location: Left Arm, Patient Position: Sitting, Cuff Size: Normal)   Pulse 61   Ht 5\' 5"  (1.651 m)   Wt 202 lb (91.6 kg)   LMP  (LMP Unknown)   SpO2 98%   BMI 33.61 kg/m  , BMI Body mass index is 33.61 kg/m.  Wt Readings from Last 3 Encounters:  12/02/20 202 lb (91.6 kg)  09/10/20 200 lb (90.7  kg)  08/22/20 200 lb (90.7 kg)    GEN: Well nourished, overweight, well developed, in no acute distress. HEENT: normal. Neck: Supple, no JVD, carotid bruits, or masses. Cardiac: RRR, no murmurs, rubs, or gallops. No clubbing, cyanosis, edema.  Radials/PT 2+ and equal bilaterally.  Respiratory:  Respirations regular and unlabored, clear to auscultation bilaterally. GI: Soft, nontender, nondistended. MS: No deformity or atrophy. Skin: Warm and dry, no rash. Neuro:  Strength and sensation are intact. Psych: Normal affect.  Assessment & Plan    1. PAF/palpitations-PAF diagnosed in 2014.  Beta-blocker therapy discontinued due to bradycardia.  Previously on flecainide for no longer taking.  Reports no recurrent palpitations.  Long-term monitor 06/2019 with no evidence of PAF.  Addition of anticoagulation has been deferred.  2. Hypertension-blood pressure well controlled in clinic today but reports markedly elevated blood pressures at home associated with headache.  She will bring her blood pressure cuff to next office visit to reassess.   We will start losartan 12.5 mg daily.  Avoid beta-blocker calcium channel blocker due to previous bradycardia.  BMP in 1 week for monitoring of renal function.  If BP not well controlled could consider increased dose to 25 mg daily.  I do anticipate that stress and pain are also contributory to elevated blood pressure.  3. Chest pain / hiatal hernia / GERD -reports an episode of chest discomfort yesterday that felt like a band and radiated elevated on her chest.  This occurred at rest while eating dinner.  EKG today with no acute ST/T wave changes.  We discussed that symptoms are atypical for angina as they occurred at rest.  Likely etiology hiatal hernia vs GERD. Encouraged to follow up with GI. No indication for ischemic evaluation at this time.  If she has recurrent symptoms we could consider coronary CTA.  Disposition: Follow up in 1 month(s) with Dr. Rockey Situ or APP  Signed, Loel Dubonnet, NP 12/02/2020, 3:01 PM [place]

## 2020-12-02 NOTE — Patient Instructions (Addendum)
Medication Instructions:  Your physician has recommended you make the following change in your medication:  START Losartan 12.5mg  (half tablet) daily  TRIAL a one week course of taking Protonix twice daily  For breakthrough acid reflux symptoms you could try Famotidine (Pepcid) as needed twice per day  *If you need a refill on your cardiac medications before your next appointment, please call your pharmacy*  Lab Work: Your physician recommends that you return for lab work in 1 week at [facility]  If you have labs (blood work) drawn today and your tests are completely normal, you will receive your results only by: Marland Kitchen MyChart Message (if you have MyChart) OR . A paper copy in the mail If you have any lab test that is abnormal or we need to change your treatment, we will call you to review the results.  Testing/Procedures: Your EKG today showed normal sinus rhythm which is a great result! No signs of blockages or abnormal heart rhythms.   Follow-Up: At [facility], you and your health needs are our priority.  As part of our continuing mission to provide you with exceptional heart care, we have created designated Provider Care Teams.  These Care Teams include your primary Cardiologist (physician) and Advanced Practice Providers (APPs -  Physician Assistants and Nurse Practitioners) who all work together to provide you with the care you need, when you need it.  We recommend signing up for the patient portal called "MyChart".  Sign up information is provided on this After Visit Summary.  MyChart is used to connect with patients for Virtual Visits (Telemedicine).  Patients are able to view lab/test results, encounter notes, upcoming appointments, etc.  Non-urgent messages can be sent to your provider as well.   To learn more about what you can do with MyChart, go to [email or web address].    Your next appointment:   1 month(s)  The format for your next appointment:   In Person  Provider:    You may see Ida Rogue, MD or one of the following Advanced Practice Providers on your designated Care Team:    Murray Hodgkins, NP  Christell Faith, PA-C  Marrianne Mood, PA-C  Cadence Kathlen Mody, [place]  Laurann Montana, NP  Other Instructions  Heart Healthy Diet Recommendations: A low-salt diet is recommended. Meats should be grilled, baked, or boiled. Avoid fried foods. Focus on lean protein sources like fish or chicken with vegetables and fruits. The American Heart Association is a Microbiologist!  American Heart Association Diet and Lifeystyle Recommendations   Exercise recommendations: The American Heart Association recommends 150 minutes of moderate intensity exercise weekly. Try 30 minutes of moderate intensity exercise 4-5 times per week. This could include walking, jogging, or swimming.

## 2020-12-28 ENCOUNTER — Telehealth: Payer: Self-pay

## 2020-12-28 NOTE — Telephone Encounter (Signed)
Patient is having GI issues. Was a patient of Dr. Mcneil Sober in 2021 but refuses to see her again. Patient Says she had to travel [place] to get a second opinion and was told the medicine Dr. Darene Lamer. prescribed made her issue worse. Says Dr. Allen Norris comes highly recommended and patients wants to switch her care to him or leave the practice completely. Please advise.

## 2020-12-29 NOTE — Telephone Encounter (Signed)
Patient also mentioned having an anal fissure and that surgery was recommended.

## 2020-12-29 NOTE — Telephone Encounter (Signed)
Called patient back and had to leave her a detailed message by letting her know what had happened in the past and how Dr. Allen Norris could help her if any. However, now we have to await for her returned call so she could let me know how Dr. Allen Norris is able to help her.

## 2020-12-29 NOTE — Telephone Encounter (Signed)
Patient alled back and I asked her the below questions that Dr. Allen Norris wanted answered. Patient stated that she had seen Dr. Candace Cruise, Dr. Bonna Gains and a physician in [place] about her GI issues and at the end she had been recommended for her to have surgery for her hiatal hernia and she does not believe that she needs the surgery. Patient believes that she has "stomach" issues that need to be solved. Patient stated that the reason she went to [facility] for a secon opinion is because she had mentioned her situation to her mother and her mother recommended the physician. However, after seeing him and not getting results and him wanting her to get surgery, then she was referred to be seen by Dr. Allen Norris as a last resort. Patient stated that she continues to have constipation, dark/black stools on/off, esophageal spasms, severe abdominal pain and nausea. I told patient that I need to speak to the physician first and then I would notify her with what is recommended. Please advise.

## 2020-12-29 NOTE — Telephone Encounter (Signed)
Could you please tell us what to do. Thank you!

## 2020-12-30 NOTE — Telephone Encounter (Signed)
error 

## 2020-12-31 NOTE — Progress Notes (Deleted)
Virtual Visit via Telephone Note   This visit type was conducted due to national recommendations for restrictions regarding the COVID-19 Pandemic (e.g. social distancing) in an effort to limit this patient's exposure and mitigate transmission in our community.  Due to her co-morbid illnesses, this patient is at least at moderate risk for complications without adequate follow up.  This format is felt to be most appropriate for this patient at this time.  The patient did not have access to video technology/had technical difficulties with video requiring transitioning to audio format only (telephone).  All issues noted in this document were discussed and addressed.  No physical exam could be performed with this format.  Please refer to the patient's chart for her  consent to telehealth for [facility].   I connected with  [name] [name] on 12/31/20 by a video enabled telemedicine application and verified that I am speaking with the correct person using two identifiers. I am contacting the patient above from our cardiology clinic office or alternate office work station to their home, I discussed the limitations of evaluation and management by telemedicine. The patient expressed understanding and agreed to proceed.   Evaluation Performed:  Follow-up visit  Date:  12/31/2020   ID:  [name] [name], DOB 04/22/1966, MRN [id number]  Patient Location:  [address] [place] [address]   Provider location:   [facility], [place] office  PCP:  Venita Lick, NP  Cardiologist:  [facility]   No chief complaint on file.   History of Present Illness:    [name] [name] is a 55 y.o. female who presents via audio/video conferencing for a telehealth visit today.   The patient does not symptoms concerning for COVID-19 infection (fever, chills, cough, or new SHORTNESS OF BREATH).   Patient has a past medical history of [name] [name] is a 55 year old woman with history  of GERD, presenting to the hospital 03/27/2013 with tachycardia, shortness of breath. Diagnosed with atrial fibrillation with RVR, heart rate 140s. She was started on Cardizem, beta blocker with improved heart rate control, converting to normal sinus rhythm with short runs of tachycardia. She was discharged on Cardizem 4 times a day, metoprolol 25 mg twice a day. She presents today for follow-up of her paroxysmal atrial fibrillation  Last seen by myself in 2019 Seen in our clinic September 2020 For palpitations Was using beta-blocker daily with flecainide as needed Has sleep apnea  On today's visit denies breakthrough tachycardia palpitations concerning for atrial fibrillation Not taking metoprolol or flecainide Metoprolol made her blood pressure dropped too low Blood pressure at home 120/80 Higher if she is stressed Weight trending higher, has a sitdown job, no regular exercise program  ZIO monitor , results reviewed Normal sinus rhythm Avg HR of 76 bpm.    Isolated SVEs were rare (<1.0%), SVE Couplets were rare (<1.0%), and no SVE Triplets were present. Isolated VEs were rare (<1.0%), VE Couplets were rare (<1.0%), and no VE Triplets were present. Ventricular Bigeminy and Trigeminy were present.  Patient triggered events were normal sinus rhythm, other triggered events were PVCs in bigeminal pattern   Other past medical history reviewed stopped all of her meds in May 2015,  She was doing well until October 2015, when she started having sx of fluttering, hard heart beats.  Echocardiogram August 2014 showing normal LV systolic function, LV diastolic relaxation abnormality, normal right ventricular systolic pressure TSH in the hospital was normal, 1.9  Prior CV studies:   The following studies were reviewed today:    Past Medical History:  Diagnosis Date  . Anxiety   . Arthritis   . B12 deficiency   . Chicken pox   . Depression   . Fibroid   . GERD (gastroesophageal  reflux disease)   . History of hiatal hernia   . Hypertension   . Iron deficiency anemia   . Menorrhagia   . Migraines   . PAF (paroxysmal atrial fibrillation) ([place])    a. 04/2013 Echo: EF 55-60%, impaired relaxation. Mild MR/TR. Nl RV fxn; b. CHA2DS2VASc = 2; c. PRN flecainide.  . Snores    Past Surgical History:  Procedure Laterality Date  . ABDOMINAL HYSTERECTOMY    . APPENDECTOMY  5/11  . C-spine surgery    . CESAREAN SECTION  10/98,9/12  . COLONOSCOPY WITH PROPOFOL N/A 03/26/2019   Procedure: COLONOSCOPY WITH PROPOFOL;  Surgeon: Virgel Manifold, MD;  Location: [facility] ENDOSCOPY;  Service: Endoscopy;  Laterality: N/A;  . ESOPHAGOGASTRODUODENOSCOPY (EGD) WITH PROPOFOL N/A 03/26/2019   Procedure: ESOPHAGOGASTRODUODENOSCOPY (EGD) WITH PROPOFOL;  Surgeon: Virgel Manifold, MD;  Location: [facility] ENDOSCOPY;  Service: Endoscopy;  Laterality: N/A;  . HYSTERECTOMY ABDOMINAL WITH SALPINGO-OOPHORECTOMY Bilateral 08/05/2018   Procedure: HYSTERECTOMY ABDOMINAL WITH BILATERAL SALPINGO-OOPHORECTOMY;  Surgeon: Brayton Mars, MD;  Location: [facility] ORS;  Service: Gynecology;  Laterality: Bilateral;  . TONSILLECTOMY  03/2008  . TUBAL LIGATION        Allergies:   Phenylmercuric nitrate, Augmentin [amoxicillin-pot clavulanate], Codeine, Diclofenac, Latex, Neomycin, and Percocet [oxycodone-acetaminophen]   Social History   Tobacco Use  . Smoking status: Former Smoker    Packs/day: 1.00    Years: 10.00    Pack years: 10.00    Types: Cigarettes    Quit date: 08/22/1991    Years since quitting: 29.3  . Smokeless tobacco: Never Used  Vaping Use  . Vaping Use: Never used  Substance Use Topics  . Alcohol use: Not Currently    Alcohol/week: 0.0 standard drinks    Comment: twice a year   . Drug use: Not Currently    Types: Marijuana    Comment: not for a couple years     Current Outpatient Medications on File Prior to Visit  Medication Sig Dispense Refill  . ibuprofen (ADVIL) 200 MG  tablet Takes 1-3 tablets as needed    . lamoTRIgine (LAMICTAL) 200 MG tablet TAKE 1 TABLET BY MOUTH  EVERY NIGHT AT BEDTIME 90 tablet 3  . LORazepam (ATIVAN) 1 MG tablet Take 1 tablet (1 mg total) by mouth every 8 (eight) hours. 30 tablet 3  . losartan (COZAAR) 25 MG tablet Take 0.5 tablets (12.5 mg total) by mouth daily. 15 tablet 2  . Multiple Vitamin (MULTIVITAMIN WITH MINERALS) TABS tablet Take 1 tablet by mouth at bedtime.    Marland Kitchen omeprazole (PRILOSEC) 20 MG capsule TAKE 1 CAPSULE(20 MG) BY MOUTH DAILY 30 capsule 0  . Oxcarbazepine (TRILEPTAL) 300 MG tablet 1 tablet in the morning and 3 tablets at night 360 tablet 0  . pantoprazole (PROTONIX) 40 MG tablet Take 1 tablet by mouth daily.    . sertraline (ZOLOFT) 100 MG tablet TAKE 1 TABLET BY MOUTH  DAILY 90 tablet 3   No current facility-administered medications on file prior to visit.     Family Hx: The patient's family history includes Alcohol abuse in her father, maternal aunt, maternal uncle, and paternal grandfather; Arthritis in her father, maternal grandmother, and sister; Diabetes in her  mother; Heart disease in her father; Leukemia in her maternal grandfather; Lung cancer in her paternal grandfather; Schizophrenia in her maternal grandmother. There is no history of Breast cancer.  ROS:   Please see the history of present illness.    Review of Systems  Constitutional: Negative.   HENT: Negative.   Respiratory: Negative.   Cardiovascular: Negative.   Gastrointestinal: Negative.   Musculoskeletal: Negative.   Neurological: Negative.   Psychiatric/Behavioral: Negative.   All other systems reviewed and are negative.    Labs/Other Tests and Data Reviewed:    Recent Labs: 05/24/2020: ALT 13; BUN 11; Creatinine, Ser 0.86; Potassium 4.0; Sodium 140; TSH 1.260 06/14/2020: Hemoglobin 13.9; Platelets 367   Recent Lipid Panel Lab Results  Component Value Date/Time   CHOL 217 (H) 03/07/2019 12:27 PM   CHOL 173 03/27/2013 05:00 AM    TRIG 126 03/07/2019 12:27 PM   TRIG 84 03/27/2013 05:00 AM   HDL 68 03/07/2019 12:27 PM   HDL 45 03/27/2013 05:00 AM   CHOLHDL 3.2 03/07/2019 12:27 PM   LDLCALC 124 (H) 03/07/2019 12:27 PM   LDLCALC 111 (H) 03/27/2013 05:00 AM    Wt Readings from Last 3 Encounters:  12/02/20 202 lb (91.6 kg)  09/10/20 200 lb (90.7 kg)  08/22/20 200 lb (90.7 kg)     Exam:    Vital Signs: Vital signs may also be detailed in the HPI LMP  (LMP Unknown)   Wt Readings from Last 3 Encounters:  12/02/20 202 lb (91.6 kg)  09/10/20 200 lb (90.7 kg)  08/22/20 200 lb (90.7 kg)   Temp Readings from Last 3 Encounters:  11/10/20 98 F (36.7 C) (Oral)  08/22/20 98.5 F (36.9 C) (Oral)  07/28/20 97.6 F (36.4 C) (Oral)   BP Readings from Last 3 Encounters:  12/02/20 130/82  11/10/20 132/83  08/22/20 (!) 140/93   Pulse Readings from Last 3 Encounters:  12/02/20 61  11/10/20 77  08/22/20 80     Well nourished, well developed female in no acute distress. Constitutional:  oriented to person, place, and time. No distress.    ASSESSMENT & PLAN:    Problem List Items Addressed This Visit   None    Paroxysmal atrial fibrillation No strong indication to start antiarrhythmic or other beta-blocker, not having significant symptoms As far as we know maintaining normal sinus rhythm No strong indication for anticoagulation at this time  Hyperlipidemia Numbers running mildly high, recommended dietary restriction, walking program, slow weight loss  PTSD Managed by primary care Feels things are stable   COVID-19 Education: The signs and symptoms of COVID-19 were discussed with the patient and how to seek care for testing (follow up with PCP or arrange E-visit).  The importance of social distancing was discussed today.  Patient Risk:   After full review of this patients clinical status, I feel that they are at least moderate risk at this time.  Time:   Today, I have spent 25 minutes with the  patient with telehealth technology discussing the cardiac and medical problems/diagnoses detailed above   Additional 10 min spent reviewing the chart prior to patient visit today   Medication Adjustments/Labs and Tests Ordered: Current medicines are reviewed at length with the patient today.  Concerns regarding medicines are outlined above.   Tests Ordered: No tests ordered   Medication Changes: No changes made    Signed, Ida Rogue, MD  [place] [facility] [address] [place] [address], [place], [place] [address]

## 2021-01-03 ENCOUNTER — Ambulatory Visit: Payer: Managed Care, Other (non HMO) | Admitting: Cardiovascular Disease

## 2021-01-04 NOTE — Telephone Encounter (Signed)
Can you schedule patient with Dr. Allen Norris please

## 2021-01-10 NOTE — Telephone Encounter (Signed)
Pt has been scheduled for June 28th at 1:30pm.

## 2021-01-16 ENCOUNTER — Other Ambulatory Visit: Payer: Self-pay | Admitting: Psychiatry

## 2021-01-16 DIAGNOSIS — F319 Bipolar disorder, unspecified: Secondary | ICD-10-CM

## 2021-01-18 ENCOUNTER — Other Ambulatory Visit: Payer: Self-pay

## 2021-01-18 ENCOUNTER — Emergency Department
Admission: EM | Admit: 2021-01-18 | Discharge: 2021-01-18 | Disposition: A | Discharge status: Home / Self Care | Payer: Managed Care, Other (non HMO) | Attending: Emergency Medicine | Admitting: Emergency Medicine

## 2021-01-18 ENCOUNTER — Encounter: Payer: Self-pay | Admitting: Emergency Medicine

## 2021-01-18 DIAGNOSIS — Z91040 Latex allergy status: Secondary | ICD-10-CM | POA: Diagnosis not present

## 2021-01-18 DIAGNOSIS — S39012A Strain of muscle, fascia and tendon of lower back, initial encounter: Secondary | ICD-10-CM | POA: Insufficient documentation

## 2021-01-18 DIAGNOSIS — S134XXA Sprain of ligaments of cervical spine, initial encounter: Secondary | ICD-10-CM | POA: Diagnosis not present

## 2021-01-18 DIAGNOSIS — Z79899 Other long term (current) drug therapy: Secondary | ICD-10-CM | POA: Insufficient documentation

## 2021-01-18 DIAGNOSIS — S3992XA Unspecified injury of lower back, initial encounter: Secondary | ICD-10-CM | POA: Diagnosis present

## 2021-01-18 DIAGNOSIS — I10 Essential (primary) hypertension: Secondary | ICD-10-CM | POA: Diagnosis not present

## 2021-01-18 DIAGNOSIS — S139XXA Sprain of joints and ligaments of unspecified parts of neck, initial encounter: Secondary | ICD-10-CM

## 2021-01-18 DIAGNOSIS — Z87891 Personal history of nicotine dependence: Secondary | ICD-10-CM | POA: Diagnosis not present

## 2021-01-18 DIAGNOSIS — Y92410 Unspecified street and highway as the place of occurrence of the external cause: Secondary | ICD-10-CM | POA: Insufficient documentation

## 2021-01-18 MED ORDER — KETOROLAC TROMETHAMINE 30 MG/ML IJ SOLN
30.0000 mg | Freq: Once | INTRAMUSCULAR | Status: AC
  Administered 2021-01-18: 30 mg via INTRAMUSCULAR
  Filled 2021-01-18: qty 1

## 2021-01-18 MED ORDER — CYCLOBENZAPRINE HCL 5 MG PO TABS: 5.0000 mg | ORAL_TABLET | Freq: Three times a day (TID) | ORAL | 0 refills | Status: DC | PRN

## 2021-01-18 MED ORDER — MELOXICAM 15 MG PO TABS: 15.0000 mg | ORAL_TABLET | Freq: Every day | ORAL | 0 refills | Status: AC
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
## 2021-01-19 ENCOUNTER — Telehealth: Payer: Self-pay | Admitting: Psychiatry

## 2021-01-19 NOTE — Telephone Encounter (Signed)
She gave me a fax number [phone].She is at work today but will stay out starting tomorrow if you can put that date on the letter.

## 2021-01-19 NOTE — Telephone Encounter (Signed)
[name] [name] called and wants a note to stay out of work. She said work has gotten a lot worse and feels like she is going to hurt someone at work. Please call her at [phone]. Next visit is 01/27/21.

## 2021-01-19 NOTE — Telephone Encounter (Signed)
Rtc to pt and she is struggling to keep her anger under control and she feels like she will snap on anyone at any moment.She is overwhelmed.She stated she is going to get FMLA paperwork faxed to [place] so that she can have an excused leave when she needs it.

## 2021-01-19 NOTE — Telephone Encounter (Signed)
Reviewed

## 2021-01-19 NOTE — Telephone Encounter (Signed)
RX note written for out of work 01/20/21 until return 01/31/21

## 2021-01-19 NOTE — Telephone Encounter (Signed)
She has an appointment with me on June 9.  I will go ahead and agree to write her out of work until Monday January 31, 2021.  We can discuss it in more detail at her appointment.  At her last appointment she was struggling with anger problems and we made the following changes: Increase Trileptal to 600 mg AM & 900 mg PM.  If fails consider Vraylar. Gave samples Vraylar as backup plan 1.5 mg daily.  Therefore tell her to go ahead and start the Vraylar if she has not done so already using the samples I gave her at the last visit.

## 2021-01-20 ENCOUNTER — Telehealth: Payer: Self-pay | Admitting: Psychiatry

## 2021-01-20 NOTE — Telephone Encounter (Signed)
Received fax from [facility] regarding Tracie Harrier. Completion needed for Attending Provider Statement. Placed on Traci's desk.

## 2021-01-25 ENCOUNTER — Telehealth: Payer: Self-pay

## 2021-01-25 NOTE — Telephone Encounter (Signed)
Sure that sounds reasonable

## 2021-01-25 NOTE — Telephone Encounter (Signed)
I have pt's FMLA paperwork, she has apt on 06/09. Hold off completing till after her apt?

## 2021-01-27 ENCOUNTER — Encounter: Payer: Self-pay | Admitting: Psychiatry

## 2021-01-27 ENCOUNTER — Other Ambulatory Visit: Payer: Self-pay

## 2021-01-27 ENCOUNTER — Ambulatory Visit (INDEPENDENT_AMBULATORY_CARE_PROVIDER_SITE_OTHER): Payer: 59 | Admitting: Psychiatry

## 2021-01-27 DIAGNOSIS — F40240 Claustrophobia: Secondary | ICD-10-CM

## 2021-01-27 DIAGNOSIS — F4310 Post-traumatic stress disorder, unspecified: Secondary | ICD-10-CM

## 2021-01-27 DIAGNOSIS — F4001 Agoraphobia with panic disorder: Secondary | ICD-10-CM

## 2021-01-27 DIAGNOSIS — R5382 Chronic fatigue, unspecified: Secondary | ICD-10-CM

## 2021-01-27 DIAGNOSIS — F3162 Bipolar disorder, current episode mixed, moderate: Secondary | ICD-10-CM

## 2021-01-27 DIAGNOSIS — F411 Generalized anxiety disorder: Secondary | ICD-10-CM

## 2021-01-27 DIAGNOSIS — E538 Deficiency of other specified B group vitamins: Secondary | ICD-10-CM

## 2021-01-27 DIAGNOSIS — R7989 Other specified abnormal findings of blood chemistry: Secondary | ICD-10-CM

## 2021-01-27 NOTE — Progress Notes (Signed)
[name] [name] [id number] 05-28-1966 55 y.o.    Subjective:   Patient ID:  [name] [name] is a 55 y.o. (DOB 1966-06-19) female.  Chief Complaint:  Chief Complaint  Patient presents with   Bipolar 1 disorder, mixed, moderate ([place])   Stress   Fatigue   Anxiety   Depression    Depression        Associated symptoms include fatigue.  Associated symptoms include no decreased concentration and no suicidal ideas.  Past medical history includes anxiety.   Anxiety Symptoms include nervous/anxious behavior and palpitations. Patient reports no confusion, decreased concentration, dizziness or suicidal ideas.    [name] [name] presents to the office today for follow-up of several dxes.  At visit  January 2020.  She had recently been diagnosed with low vitamin B12.  She was initiating treatment.  No meds were changed.  visit July 2020.  her anxiety was higher after reducing sertraline.  Therefore it was increased back to 100 mg daily.  06/2020 appt with the following noted: Cardiologist said she had chronic tx for afib and PVC's and it acts up more when she's under more stress.  Cardiologist suggested Bz when having the episodes.  Better at present but has periods of them daily.  They last an hour or more.  Rarely takes clonazepam, but it helps in 30 mins.  Tolerates clonazepam well otherwise. She felt increase sertraline helped some with anxiety.  It is better.  More irritable last couple of weeks than anxious unless heart episode.  Missed a couple of weeks of hormones which might have caused this. Panic attacks from 0-2 weekly is typical but episodes where she may have them daily for a couple of weeks associated with the palpitations as noted. Plan: DC clonazepam Start lorazepam 1 mg every 8 hours as needed anxiety attacks.  Call back if it is less effective than clonazepam or less well-tolerated.  04/06/20 appt with the following noted: Real stressed out and feels everything caving in on  her.  Work, home and everything.  D broke her leg and ex H had to stay with them. Supervisor stress and short-staffed.  Doesn't want the vaccine. Only one RX lorazepam.  It helped.  But afraid of addiction.  Tends to isolate weekends bc stress. Consistent with oxcarb 900 mg HS, sertraline 100, lamotrigine 200. Wanting to stay up late and then has EMA.   Not hyper, never.  Is irritable.   ExH's family gives her a hard time and drags D into it. Plan no med changes:  10/05/2020 appointment with the following noted: Some anger and anxiety but probably situational over the Covid situation and dealing with work situation.  Wakes that way a lot of times.  Irritable.  Walked out of work one day.  In the background all the time but will come out with triggers.  Controls it usually.  No panic. Plan: Increase Trileptal to 300 mg AM & 900 mg PM.   10/20/20 TC Rtc to patient and she reports feeling overwhelmed and stressed at work causing increased anxiety. She reports "going off" on her boss Friday. She reports there is someone at work going around telling everyone that patient is bipolar. [name]. She doesn't know how she would know. [name]. I asked if this was discussed at her last visit on 10/05/2020 and she said no. We discussed what she is thinking  she needs, she really wants to see what Dr. Clovis Pu thinks and get his opinion. She reports she thinks she needs 2 weeks off to get to feeling better and remove herself from work for right now. Then intermittent from that time. Informed her that is something he would probably do but I would discuss with him. OK'd  FMLA  11/23/20 TC: Message from staff: Addendum to previous messages. Next appt is 11/29/20 at 1:30. She has called back and said that she is having trouble at work with her co-workers. She wants to check into a hospital but she told me she didn't have any  suicide thoughts. She is under a lot of stress at work. Her best friend told someone at her employer that she has bipolar and co-workers are giving her a hard time because of the diagnosis. She said that she is still taking her meds. Her blood pressure when taken a couple of days ago was 184/101 and 148/88. She would like to be pointed in the right direction of what she needs to do? Her phone number is [phone]. She would like to speak to someone before her visit on Monday.   MD TC with pt:  Work stress with boss spreading info that she's bipolar.  Getting knit picked at work.  Did this to another friend who had bipolar disorder. Works [employer].  Talked to [place] yesterday.  Get a final warning writeup and feels it is unjust.  Working there since 2017 and all started in last 2 mos.    Was doing fine until this all started.  Feels discriminated against.  No SI.  Will see her Monday and start FMLA for a couple of weeks and we'll make med changes. She agrees to the plan.  [name] Parents, MD, DFAPA  11/29/20 appt noted: Worked today.  Got new supervisor who's cleaning house.  She feels picked on.  This has made her mad and she has expressed anger at work and that's gotten her written up. I feel on edge all the time.  Also overwhelmed at home and problems with daughter as well.  Problems keeping things straight at home. More anger and irritability.   Patient denies difficulty with sleep initiation or maintenance. Denies appetite disturbance.  Patient reports that energy and motivation have been good.  She still has complaints of concentration and forgetfulness as previously noted.  Patient denies any suicidal ideation.  Still forgetful.   01/27/21 appt noted: Feels she's being mistreated at work bc she's bipolar.  Plans to get a new job.  Trying to get house ready to sale but D and son won't help with it.  Needed a leave from work.  Had panic attack last week thinking of going there.  Cried at work when there the  last day. She is more down as well as agitated and angry.  She has difficulty react really relaxing.  She easily panics.  She has difficulty concentrating.  She has feelings of hopelessness and helplessness.  No suicidal thoughts She has not tried the [employer] as suggested so far but agrees to do so.  Was told she had low b12, still working on getting it up.  Getting shots.  hysterectomy for endometriosis and normally takes a patch distributing hormones though she is recently been out and it worsened her mood when she was not taking it..  Worried about hormones affecting mood.  Hx bad PMS.  Prior psychiatric medication trials include  sertraline,  Depakote,  lithium, lamotrigine, Trileptal  900, Saphris, risperidone,  Clonazepam.  Xanax SE, lorazepam 1.  Review of Systems:  Review of Systems  Constitutional:  Positive for fatigue. Negative for fever.  Cardiovascular:  Positive for palpitations.  Neurological:  Negative for dizziness, tremors and weakness.  Psychiatric/Behavioral:  Positive for agitation. Negative for behavioral problems, confusion, decreased concentration, dysphoric mood, hallucinations, self-injury, sleep disturbance and suicidal ideas. The patient is nervous/anxious. The patient is not hyperactive.    Medications: I have reviewed the patient's current medications.  Current Outpatient Medications  Medication Sig Dispense Refill   cyclobenzaprine (FLEXERIL) 5 MG tablet Take 1 tablet (5 mg total) by mouth 3 (three) times daily as needed. 15 tablet 0   ibuprofen (ADVIL) 200 MG tablet Takes 1-3 tablets as needed     lamoTRIgine (LAMICTAL) 200 MG tablet TAKE 1 TABLET BY MOUTH  EVERY NIGHT AT BEDTIME 90 tablet 3   LORazepam (ATIVAN) 1 MG tablet Take 1 tablet (1 mg total) by mouth every 8 (eight) hours. 30 tablet 3   Multiple Vitamin (MULTIVITAMIN WITH MINERALS) TABS tablet Take 1 tablet by mouth at bedtime.     omeprazole (PRILOSEC) 20 MG capsule TAKE 1 CAPSULE(20 MG) BY MOUTH  DAILY 30 capsule 0   Oxcarbazepine (TRILEPTAL) 300 MG tablet TAKE 1 TABLET BY MOUTH EVERY MORNING AND 3 TABLETS AT NIGHT (Patient taking differently: 3 TABLETS AT NIGHT) 360 tablet 0   sertraline (ZOLOFT) 100 MG tablet TAKE 1 TABLET BY MOUTH  DAILY 90 tablet 3   losartan (COZAAR) 25 MG tablet Take 0.5 tablets (12.5 mg total) by mouth daily. (Patient not taking: Reported on 01/27/2021) 15 tablet 2   meloxicam (MOBIC) 15 MG tablet Take 1 tablet (15 mg total) by mouth daily. (Patient not taking: Reported on 01/27/2021) 30 tablet 0   pantoprazole (PROTONIX) 40 MG tablet Take 1 tablet by mouth daily. (Patient not taking: Reported on 01/27/2021)     No current facility-administered medications for this visit.    Medication Side Effects: None  Allergies:  Allergies  Allergen Reactions   Phenylmercuric Nitrate Rash   Augmentin [Amoxicillin-Pot Clavulanate] Nausea Only    Has patient had a PCN reaction causing immediate rash, facial/tongue/throat swelling, SOB or lightheadedness with hypotension: No Has patient had a PCN reaction causing severe rash involving mucus membranes or skin necrosis: No Has patient had a PCN reaction that required hospitalization: No Has patient had a PCN reaction occurring within the last 10 years: Yes If all of the above answers are "NO", then may proceed with Cephalosporin use.    Codeine Nausea Only   Diclofenac Nausea Only   Latex Rash   Neomycin Rash   Percocet [Oxycodone-Acetaminophen] Rash    Hives     Past Medical History:  Diagnosis Date   Anxiety    Arthritis    B12 deficiency    Chicken pox    Depression    Fibroid    GERD (gastroesophageal reflux disease)    History of hiatal hernia    Hypertension    Iron deficiency anemia    Menorrhagia    Migraines    PAF (paroxysmal atrial fibrillation) ([place])    a. 04/2013 Echo: EF 55-60%, impaired relaxation. Mild MR/TR. Nl RV fxn; b. CHA2DS2VASc = 2; c. PRN flecainide.   Snores     Family History   Problem Relation Age of Onset   Diabetes Mother    Alcohol abuse Father    Arthritis Father    Heart disease Father    Arthritis  Sister    Alcohol abuse Maternal Aunt    Alcohol abuse Maternal Uncle    Alcohol abuse Paternal Grandfather    Lung cancer Paternal Grandfather        smoker    Arthritis Maternal Grandmother    Schizophrenia Maternal Grandmother    Leukemia Maternal Grandfather    Breast cancer Neg Hx     Social History   Socioeconomic History   Marital status: Legally Separated    Spouse name: Not on file   Number of children: Not on file   Years of education: Not on file   Highest education level: Not on file  Occupational History   Not on file  Tobacco Use   Smoking status: Former    Packs/day: 1.00    Years: 10.00    Pack years: 10.00    Types: Cigarettes    Quit date: 08/22/1991    Years since quitting: 29.4   Smokeless tobacco: Never  Vaping Use   Vaping Use: Never used  Substance and Sexual Activity   Alcohol use: Not Currently    Alcohol/week: 0.0 standard drinks    Comment: twice a year    Drug use: Not Currently    Types: Marijuana    Comment: not for a couple years   Sexual activity: Yes    Partners: Male    Birth control/protection: Surgical    Comment: Husband  Other Topics Concern   Not on file  Social History Narrative   [employer]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    Lives with husband and 2 children    Son- 45 yo and daughter 24 yo    Pets: 2 dogs    Caffeine- 2 sodas 20 oz, coffee 1 cup occasionally    Enjoys being outside with family       DPR mom Horton Chin [phone]    Social Determinants of Health   Financial Resource Strain: Not on file  Food Insecurity: Not on file  Transportation Needs: Not on file  Physical Activity: Not on file  Stress: Not on file  Social Connections: Not on file  Intimate Partner Violence: Not on file    Past Medical History, Surgical history, Social history, and Family history were  reviewed and updated as appropriate.   Please see review of systems for further details on the patient's review from today.   Objective:   Physical Exam:  LMP  (LMP Unknown)   Physical Exam Constitutional:      General: She is not in acute distress. Musculoskeletal:        General: No deformity.  Neurological:     Mental Status: She is alert and oriented to person, place, and time.     Cranial Nerves: No dysarthria.     Coordination: Coordination normal.  Psychiatric:        Attention and Perception: Attention and perception normal. She does not perceive auditory or visual hallucinations.        Mood and Affect: Mood is anxious and depressed. Affect is not labile, blunt, angry or inappropriate.        Speech: Speech normal.        Behavior: Behavior normal. Behavior is cooperative.        Thought Content: Thought content normal. Thought content is not paranoid or delusional. Thought content does not include homicidal or suicidal ideation. Thought content does not include homicidal or suicidal plan.        Cognition and Memory: Cognition and  memory normal.        Judgment: Judgment normal.     Comments: Insight intact Her psychiatric symptoms are worse as noted.  She states she has been accused of being paranoid.    Lab Review:     Component Value Date/Time   NA 140 05/24/2020 1701   NA 138 11/25/2014 2152   K 4.0 05/24/2020 1701   K 3.7 11/25/2014 2152   CL 99 05/24/2020 1701   CL 105 11/25/2014 2152   CO2 23 05/24/2020 1701   CO2 28 11/25/2014 2152   GLUCOSE 97 05/24/2020 1701   GLUCOSE 108 (H) 12/26/2016 0812   GLUCOSE 98 11/25/2014 2152   BUN 11 05/24/2020 1701   BUN 10 11/25/2014 2152   CREATININE 0.86 05/24/2020 1701   CREATININE 0.81 11/25/2014 2152   CALCIUM 10.3 (H) 05/24/2020 1701   CALCIUM 10.0 11/25/2014 2152   PROT 6.9 05/24/2020 1701   PROT 7.2 11/25/2014 2152   ALBUMIN 4.6 05/24/2020 1701   ALBUMIN 4.1 11/25/2014 2152   AST 13 05/24/2020 1701   AST  15 11/25/2014 2152   ALT 13 05/24/2020 1701   ALT 13 (L) 11/25/2014 2152   ALKPHOS 108 05/24/2020 1701   ALKPHOS 61 11/25/2014 2152   BILITOT 0.2 05/24/2020 1701   BILITOT 0.4 11/25/2014 2152   GFRNONAA 78 05/24/2020 1701   GFRNONAA >60 11/25/2014 2152   GFRAA 90 05/24/2020 1701   GFRAA >60 11/25/2014 2152       Component Value Date/Time   WBC 6.4 06/14/2020 1625   WBC 4.9 07/30/2018 1033   RBC 4.83 06/14/2020 1625   RBC 4.82 07/30/2018 1033   HGB 13.9 06/14/2020 1625   HCT 41.7 06/14/2020 1625   PLT 367 06/14/2020 1625   MCV 86 06/14/2020 1625   MCV 77 (L) 11/25/2014 2152   MCH 28.8 06/14/2020 1625   MCH 22.4 (L) 07/30/2018 1033   MCHC 33.3 06/14/2020 1625   MCHC 29.8 (L) 07/30/2018 1033   RDW 13.2 06/14/2020 1625   RDW 22.4 (H) 11/25/2014 2152   LYMPHSABS 2.3 05/24/2020 1701   LYMPHSABS 2.4 03/27/2013 0500   MONOABS 0.4 07/30/2018 1033   MONOABS 0.4 03/27/2013 0500   EOSABS 0.1 05/24/2020 1701   EOSABS 0.2 03/27/2013 0500   BASOSABS 0.0 05/24/2020 1701   BASOSABS 0.0 03/27/2013 0500    No results found for: POCLITH, LITHIUM   No results found for: PHENYTOIN, PHENOBARB, VALPROATE, CBMZ   .res Assessment: Plan:    Sevin was seen today for bipolar 1 disorder, mixed, moderate ([facility]), stress, fatigue, anxiety and depression.  Diagnoses and all orders for this visit:  Bipolar 1 disorder, mixed, moderate ([facility])  Panic disorder with agoraphobia  PTSD (post-traumatic stress disorder)  Generalized anxiety disorder  Claustrophobia  B12 deficiency  Chronic fatigue  Low vitamin D level  Greater than 50% of 30 minutes of non-face to face time with patient was spent on counseling and coordination of care. We discussed Stefan has a history of bipolar disorder with heavy seasonal component .  Unfortunately her mood symptoms are much worse with bipolar mixed symptoms plus panic attacks.  She is under increased stress at work because of her symptoms and that the  stress at work is making her symptoms worse as well.  This is of real concern because she does have underlying cardiology problems.  Cardiologist has suggested as needed benzo to help reduce her cardiac symptoms.     She previously had exacerbation of anxiety when  she attempted to wean off sertraline.  Start Vraylar 1.5 mg daily and if it works will wean other meds. If fails consider Latuda or Vraylar. Gave samples Vraylar as backup plan 1.5 mg daily.  Disc job problems.  She has been given a final warning at work.  We discussed the pros and cons of sharing mental health information with her employer.  She feels her job may be in jeopardy.  She was given a note which she can use at her discretion that says that she is under our medical care.  Part of her symptoms include irritability and that we are addressing that through medication changes.    Agree with medical leave until August 1 due to depression, mood swings, anger outbursts, crying spells that interfere with ability to do her work and reduced concentration.  She is also ruminating on her difficulty functioning and on feeling mistreated at work.  This will allow [place] time to make change in the mood stabilizer.  It takes Vraylar 6 weeks to reach steady state once the final dose is achieved.  We may have to increase the dose above the initial starting dose.  Low B12 and vitamin D can contribute to psych problems and can be consequence of AEDs.  We discussed the short-term risks associated with benzodiazepines including sedation and increased fall risk among others.  Discussed long-term side effect risk including dependence, potential withdrawal symptoms, and the potential eventual dose-related risk of dementia.  Disc risk dependence in detail in terms of the amount that can be used without it.  This appointment was 30 minutes  FU 6 weeks  [name] Parents, MD, DFAPA    Please see After Visit Summary for patient specific  instructions.  Future Appointments  Date Time Provider [place]  02/15/2021  1:30 PM Lucilla Lame, MD [facility] None    No orders of the defined types were placed in this encounter.     -------------------------------

## 2021-01-31 DIAGNOSIS — Z0289 Encounter for other administrative examinations: Secondary | ICD-10-CM

## 2021-01-31 NOTE — Telephone Encounter (Signed)
Forms completed and signed. Will give to office staff to send forms and records.

## 2021-02-01 NOTE — Telephone Encounter (Signed)
Forms and records faxed today.

## 2021-02-15 ENCOUNTER — Ambulatory Visit: Payer: Managed Care, Other (non HMO) | Admitting: Gastroenterology

## 2021-02-15 ENCOUNTER — Encounter: Payer: Self-pay | Admitting: Gastroenterology

## 2021-02-15 ENCOUNTER — Other Ambulatory Visit: Payer: Self-pay

## 2021-02-15 VITALS — BP 155/80 | HR 96 | Ht 65.0 in | Wt 199.6 lb

## 2021-02-15 DIAGNOSIS — K219 Gastro-esophageal reflux disease without esophagitis: Secondary | ICD-10-CM

## 2021-02-15 DIAGNOSIS — R1013 Epigastric pain: Secondary | ICD-10-CM | POA: Diagnosis not present

## 2021-02-15 MED ORDER — OMEPRAZOLE 40 MG PO CPDR: 40.0000 mg | DELAYED_RELEASE_CAPSULE | Freq: Every day | ORAL | 6 refills | Status: DC

## 2021-02-15 MED ORDER — DICYCLOMINE HCL 20 MG PO TABS: 20.0000 mg | ORAL_TABLET | Freq: Three times a day (TID) | ORAL | 3 refills | Status: DC

## 2021-02-15 NOTE — Progress Notes (Signed)
Primary Care Physician: Pcp, No  Primary Gastroenterologist:  Dr. Lucilla Lame  Chief Complaint  Patient presents with   Stomach issues    HPI: [name] [name] is a 55 y.o. female here with a history of stomach issues.  The patient has been seen by Dr. Bonna Gains in the past and had an EGD and colonoscopy in 2020 with multiple polyps seen throughout the colon and a recommendation for repeat colonoscopy in 5 years.  The patient now wants to transfer her care to me for her stomach pains.  The patient has a history of rectal bleeding and rectal pain for which she was being treated with lidocaine.  It was postulated that the patient may have a small anal fissure since she had a rectal exam which was very painful and out of proportion to the hemorrhoids that were found during her colonoscopy.  The patient has a history of IBS and a family history of a hiatal hernia.  The patient was concerned whether she had a stricture or narrowing in her esophagus that may be causing her discomfort.  The patient's upper endoscopy in August 2020 did not show any of those findings and her symptoms are reported to be intermittent.  The patient does report that she is taking omeprazole 20 mg a day and is having acid breakthrough.   Past Medical History:  Diagnosis Date   Anxiety    Arthritis    B12 deficiency    Chicken pox    Depression    Fibroid    GERD (gastroesophageal reflux disease)    History of hiatal hernia    Hypertension    Iron deficiency anemia    Menorrhagia    Migraines    PAF (paroxysmal atrial fibrillation) ([place])    a. 04/2013 Echo: EF 55-60%, impaired relaxation. Mild MR/TR. Nl RV fxn; b. CHA2DS2VASc = 2; c. PRN flecainide.   Snores     Current Outpatient Medications  Medication Sig Dispense Refill   ibuprofen (ADVIL) 200 MG tablet Takes 1-3 tablets as needed     lamoTRIgine (LAMICTAL) 200 MG tablet TAKE 1 TABLET BY MOUTH  EVERY NIGHT AT BEDTIME 90 tablet 3   LORazepam (ATIVAN) 1 MG  tablet Take 1 tablet (1 mg total) by mouth every 8 (eight) hours. 30 tablet 3   Multiple Vitamin (MULTIVITAMIN WITH MINERALS) TABS tablet Take 1 tablet by mouth at bedtime.     omeprazole (PRILOSEC) 20 MG capsule TAKE 1 CAPSULE(20 MG) BY MOUTH DAILY 30 capsule 0   Oxcarbazepine (TRILEPTAL) 300 MG tablet TAKE 1 TABLET BY MOUTH EVERY MORNING AND 3 TABLETS AT NIGHT (Patient taking differently: 3 TABLETS AT NIGHT) 360 tablet 0   sertraline (ZOLOFT) 100 MG tablet TAKE 1 TABLET BY MOUTH  DAILY 90 tablet 3   cyclobenzaprine (FLEXERIL) 5 MG tablet Take 1 tablet (5 mg total) by mouth 3 (three) times daily as needed. (Patient not taking: Reported on 02/15/2021) 15 tablet 0   losartan (COZAAR) 25 MG tablet Take 0.5 tablets (12.5 mg total) by mouth daily. (Patient not taking: No sig reported) 15 tablet 2   meloxicam (MOBIC) 15 MG tablet Take 1 tablet (15 mg total) by mouth daily. (Patient not taking: No sig reported) 30 tablet 0   pantoprazole (PROTONIX) 40 MG tablet Take 1 tablet by mouth daily. (Patient not taking: No sig reported)     No current facility-administered medications for this visit.    Allergies as of 02/15/2021 - Review Complete 02/15/2021  Allergen Reaction Noted   Phenylmercuric nitrate Rash 07/02/2014   Augmentin [amoxicillin-pot clavulanate] Nausea Only 02/10/2013   Codeine Nausea Only 02/10/2013   Diclofenac Nausea Only    Latex Rash 02/10/2013   Neomycin Rash 06/26/2015   Percocet [oxycodone-acetaminophen] Rash 02/10/2013    ROS:  General: Negative for anorexia, weight loss, fever, chills, fatigue, weakness. ENT: Negative for hoarseness, difficulty swallowing , nasal congestion. CV: Negative for chest pain, angina, palpitations, dyspnea on exertion, peripheral edema.  Respiratory: Negative for dyspnea at rest, dyspnea on exertion, cough, sputum, wheezing.  GI: See history of present illness. GU:  Negative for dysuria, hematuria, urinary incontinence, urinary frequency,  nocturnal urination.  Endo: Negative for unusual weight change.    Physical Examination:   BP (!) 155/80 (BP Location: Left Arm, Patient Position: Sitting, Cuff Size: Large)   Pulse 96   Ht 5\' 5"  (1.651 m)   Wt 199 lb 9.6 oz (90.5 kg)   LMP  (LMP Unknown)   BMI 33.22 kg/m   General: Well-nourished, well-developed in no acute distress.  Eyes: No icterus. Conjunctivae pink. Lungs: Clear to auscultation bilaterally. Non-labored. Heart: Regular rate and rhythm, no murmurs rubs or gallops.  Abdomen: Bowel sounds are normal, nontender, nondistended, no hepatosplenomegaly or masses, no abdominal bruits or hernia , no rebound or guarding.   Extremities: No lower extremity edema. No clubbing or deformities. Neuro: Alert and oriented x 3.  Grossly intact. Skin: Warm and dry, no jaundice.   Psych: Alert and cooperative, normal mood and affect.  Labs:    Imaging Studies: No results found.  Assessment and Plan:   [name] [name] is a 55 y.o. y/o female who comes in today with a history of upper abdominal discomfort and reflux symptoms.  The patient has a history of irritable bowel syndrome and reports that she is having cramps along the upper part of her abdomen.  She also has acid breakthrough on omeprazole 20 mg a day.  The patient will be increased to 40 mg of omeprazole a day.  The patient has tried Protonix and states that her symptoms were worse.  She also will be started on dicyclomine for her abdominal discomfort.  Her symptoms may also represent esophageal spasms and she is explained that that diagnosis will be entertained if her symptoms do not improve on her present trial of dicyclomine and increased omeprazole.  She states that she has taken dicyclomine in the past but stopped taking when she lost her medical coverage as she recalls that it did help her back then.  The patient has been explained the plan and agrees with it.     Lucilla Lame, MD. [facility]al    Note: This dictation was  prepared with Dragon dictation along with smaller phrase technology. Any transcriptional errors that result from this process are unintentional.

## 2021-02-23 ENCOUNTER — Encounter: Payer: Self-pay | Admitting: Psychiatry

## 2021-02-23 ENCOUNTER — Other Ambulatory Visit: Payer: Self-pay

## 2021-02-23 ENCOUNTER — Ambulatory Visit (INDEPENDENT_AMBULATORY_CARE_PROVIDER_SITE_OTHER): Payer: 59 | Admitting: Psychiatry

## 2021-02-23 DIAGNOSIS — F4001 Agoraphobia with panic disorder: Secondary | ICD-10-CM | POA: Diagnosis not present

## 2021-02-23 DIAGNOSIS — R5382 Chronic fatigue, unspecified: Secondary | ICD-10-CM

## 2021-02-23 DIAGNOSIS — E538 Deficiency of other specified B group vitamins: Secondary | ICD-10-CM

## 2021-02-23 DIAGNOSIS — F4310 Post-traumatic stress disorder, unspecified: Secondary | ICD-10-CM

## 2021-02-23 DIAGNOSIS — F40240 Claustrophobia: Secondary | ICD-10-CM

## 2021-02-23 DIAGNOSIS — F411 Generalized anxiety disorder: Secondary | ICD-10-CM

## 2021-02-23 DIAGNOSIS — F3162 Bipolar disorder, current episode mixed, moderate: Secondary | ICD-10-CM

## 2021-02-23 NOTE — Progress Notes (Signed)
[name] [id number] 1966/08/05 55 y.o.    Subjective:   Patient ID:  [name] [name] is a 55 y.o. (DOB 1966/06/20) female.  Chief Complaint:  Chief Complaint  Patient presents with   Follow-up   Anxiety   Depression   Other    irritable    Depression        Associated symptoms include fatigue.  Associated symptoms include no decreased concentration, no headaches and no suicidal ideas.  Past medical history includes anxiety.   Anxiety Symptoms include nervous/anxious behavior and palpitations. Patient reports no confusion, decreased concentration, dizziness or suicidal ideas.    [name] [name] presents to the office today for follow-up of several dxes.  At visit  January 2020.  She had recently been diagnosed with low vitamin B12.  She was initiating treatment.  No meds were changed.  visit July 2020.  her anxiety was higher after reducing sertraline.  Therefore it was increased back to 100 mg daily.  06/2020 appt with the following noted: Cardiologist said she had chronic tx for afib and PVC's and it acts up more when she's under more stress.  Cardiologist suggested Bz when having the episodes.  Better at present but has periods of them daily.  They last an hour or more.  Rarely takes clonazepam, but it helps in 30 mins.  Tolerates clonazepam well otherwise. She felt increase sertraline helped some with anxiety.  It is better.  More irritable last couple of weeks than anxious unless heart episode.  Missed a couple of weeks of hormones which might have caused this. Panic attacks from 0-2 weekly is typical but episodes where she may have them daily for a couple of weeks associated with the palpitations as noted. Plan: DC clonazepam Start lorazepam 1 mg every 8 hours as needed anxiety attacks.  Call back if it is less effective than clonazepam or less well-tolerated.  04/06/20 appt with the following noted: Real stressed out and feels everything caving in on her.  Work, home  and everything.  D broke her leg and ex H had to stay with them. Supervisor stress and short-staffed.  Doesn't want the vaccine. Only one RX lorazepam.  It helped.  But afraid of addiction.  Tends to isolate weekends bc stress. Consistent with oxcarb 900 mg HS, sertraline 100, lamotrigine 200. Wanting to stay up late and then has EMA.   Not hyper, never.  Is irritable.   ExH's family gives her a hard time and drags D into it. Plan no med changes:  10/05/2020 appointment with the following noted: Some anger and anxiety but probably situational over the Covid situation and dealing with work situation.  Wakes that way a lot of times.  Irritable.  Walked out of work one day.  In the background all the time but will come out with triggers.  Controls it usually.  No panic. Plan: Increase Trileptal to 300 mg AM & 900 mg PM.   10/20/20 TC Rtc to patient and she reports feeling overwhelmed and stressed at work causing increased anxiety. She reports "going off" on her boss Friday. She reports there is someone at work going around telling everyone that patient is bipolar. [name]. She doesn't know how she would know. [name]. I asked if this was discussed at her last visit on 10/05/2020 and she said no. We discussed what she is thinking she needs,  she really wants to see what Dr. Clovis Pu thinks and get his opinion. She reports she thinks she needs 2 weeks off to get to feeling better and remove herself from work for right now. Then intermittent from that time. Informed her that is something he would probably do but I would discuss with him. OK'd  FMLA  11/23/20 TC: Message from staff: Addendum to previous messages. Next appt is 11/29/20 at 1:30. She has called back and said that she is having trouble at work with her co-workers. She wants to check into a hospital but she told me she didn't have any suicide thoughts. She  is under a lot of stress at work. Her best friend told someone at her employer that she has bipolar and co-workers are giving her a hard time because of the diagnosis. She said that she is still taking her meds. Her blood pressure when taken a couple of days ago was 184/101 and 148/88. She would like to be pointed in the right direction of what she needs to do? Her phone number is [phone]. She would like to speak to someone before her visit on Monday.   MD TC with pt:  Work stress with boss spreading info that she's bipolar.  Getting knit picked at work.  Did this to another friend who had bipolar disorder. Works [employer].  Talked to [place] yesterday.  Get a final warning writeup and feels it is unjust.  Working there since 2017 and all started in last 2 mos.    Was doing fine until this all started.  Feels discriminated against.  No SI.  Will see her Monday and start FMLA for a couple of weeks and we'll make med changes. She agrees to the plan.  Lynder Parents, MD, DFAPA  11/29/20 appt noted: Worked today.  Got new supervisor who's cleaning house.  She feels picked on.  This has made her mad and she has expressed anger at work and that's gotten her written up. I feel on edge all the time.  Also overwhelmed at home and problems with daughter as well.  Problems keeping things straight at home. More anger and irritability.   Patient denies difficulty with sleep initiation or maintenance. Denies appetite disturbance.  Patient reports that energy and motivation have been good.  She still has complaints of concentration and forgetfulness as previously noted.  Patient denies any suicidal ideation.  Still forgetful.   01/27/21 appt noted: Feels she's being mistreated at work bc she's bipolar.  Plans to get a new job.  Trying to get house ready to sale but D and son won't help with it.  Needed a leave from work.  Had panic attack last week thinking of going there.  Cried at work when there the last day. She is  more down as well as agitated and angry.  She has difficulty react really relaxing.  She easily panics.  She has difficulty concentrating.  She has feelings of hopelessness and helplessness.  No suicidal thoughts She has not tried the [employer] as suggested so far but agrees to do so. Plan: Start Vraylar 1.5 mg daily and if it works will wean other meds. Agree with medical leave until August 1   02/23/2021 appointment with the following noted: Disability approved until 02/28/21 but requested until August 1. Doesn't feel capable of RTW due to panic over thoughts of it.  Feels people are talking about her negatively at work.  Too anxious being around coworkers. Did not start Vraylar  bc scared of antipsychotics. Sx continue as noted above. HA and GI problems better not at work. Anxiety about work causes these sx and her BP to go up  Was told she had low b12, still working on getting it up.  Getting shots.  hysterectomy for endometriosis and normally takes a patch distributing hormones though she is recently been out and it worsened her mood when she was not taking it..  Worried about hormones affecting mood.  Hx bad PMS.  Prior psychiatric medication trials include  sertraline,  Depakote,  lithium, lamotrigine, Trileptal 900, Saphris, risperidone,  Clonazepam.  Xanax SE, lorazepam 1.  Review of Systems:  Review of Systems  Constitutional:  Positive for fatigue. Negative for fever.  Cardiovascular:  Positive for palpitations.  Neurological:  Negative for dizziness, tremors, weakness and headaches.  Psychiatric/Behavioral:  Positive for agitation. Negative for behavioral problems, confusion, decreased concentration, dysphoric mood, hallucinations, self-injury, sleep disturbance and suicidal ideas. The patient is nervous/anxious. The patient is not hyperactive.    Medications: I have reviewed the patient's current medications.  Current Outpatient Medications  Medication Sig Dispense Refill    cariprazine (VRAYLAR) 1.5 MG capsule 1.5 mg daily.     cyclobenzaprine (FLEXERIL) 5 MG tablet Take 1 tablet (5 mg total) by mouth 3 (three) times daily as needed. 15 tablet 0   dicyclomine (BENTYL) 20 MG tablet Take 1 tablet (20 mg total) by mouth 3 (three) times daily before meals. 90 tablet 3   ibuprofen (ADVIL) 200 MG tablet Takes 1-3 tablets as needed     lamoTRIgine (LAMICTAL) 200 MG tablet TAKE 1 TABLET BY MOUTH  EVERY NIGHT AT BEDTIME 90 tablet 3   LORazepam (ATIVAN) 1 MG tablet Take 1 tablet (1 mg total) by mouth every 8 (eight) hours. 30 tablet 3   losartan (COZAAR) 25 MG tablet Take 0.5 tablets (12.5 mg total) by mouth daily. 15 tablet 2   Multiple Vitamin (MULTIVITAMIN WITH MINERALS) TABS tablet Take 1 tablet by mouth at bedtime.     omeprazole (PRILOSEC) 40 MG capsule Take 1 capsule (40 mg total) by mouth at bedtime. 30 capsule 6   Oxcarbazepine (TRILEPTAL) 300 MG tablet TAKE 1 TABLET BY MOUTH EVERY MORNING AND 3 TABLETS AT NIGHT (Patient taking differently: 3 TABLETS AT NIGHT) 360 tablet 0   pantoprazole (PROTONIX) 40 MG tablet Take 1 tablet by mouth daily.     sertraline (ZOLOFT) 100 MG tablet TAKE 1 TABLET BY MOUTH  DAILY 90 tablet 3   No current facility-administered medications for this visit.    Medication Side Effects: None  Allergies:  Allergies  Allergen Reactions   Phenylmercuric Nitrate Rash   Augmentin [Amoxicillin-Pot Clavulanate] Nausea Only    Has patient had a PCN reaction causing immediate rash, facial/tongue/throat swelling, SOB or lightheadedness with hypotension: No Has patient had a PCN reaction causing severe rash involving mucus membranes or skin necrosis: No Has patient had a PCN reaction that required hospitalization: No Has patient had a PCN reaction occurring within the last 10 years: Yes If all of the above answers are "NO", then may proceed with Cephalosporin use.    Codeine Nausea Only   Diclofenac Nausea Only   Latex Rash   Neomycin Rash    Percocet [Oxycodone-Acetaminophen] Rash    Hives     Past Medical History:  Diagnosis Date   Anxiety    Arthritis    B12 deficiency    Chicken pox    Depression    Fibroid  GERD (gastroesophageal reflux disease)    History of hiatal hernia    Hypertension    Iron deficiency anemia    Menorrhagia    Migraines    PAF (paroxysmal atrial fibrillation) ([place])    a. 04/2013 Echo: EF 55-60%, impaired relaxation. Mild MR/TR. Nl RV fxn; b. CHA2DS2VASc = 2; c. PRN flecainide.   Snores     Family History  Problem Relation Age of Onset   Diabetes Mother    Alcohol abuse Father    Arthritis Father    Heart disease Father    Arthritis Sister    Alcohol abuse Maternal Aunt    Alcohol abuse Maternal Uncle    Alcohol abuse Paternal Grandfather    Lung cancer Paternal Grandfather        smoker    Arthritis Maternal Grandmother    Schizophrenia Maternal Grandmother    Leukemia Maternal Grandfather    Breast cancer Neg Hx     Social History   Socioeconomic History   Marital status: Legally Separated    Spouse name: Not on file   Number of children: Not on file   Years of education: Not on file   Highest education level: Not on file  Occupational History   Not on file  Tobacco Use   Smoking status: Former    Packs/day: 1.00    Years: 10.00    Pack years: 10.00    Types: Cigarettes    Quit date: 08/22/1991    Years since quitting: 29.5   Smokeless tobacco: Never  Vaping Use   Vaping Use: Never used  Substance and Sexual Activity   Alcohol use: Not Currently    Alcohol/week: 0.0 standard drinks    Comment: twice a year    Drug use: Not Currently    Types: Marijuana    Comment: not for a couple years   Sexual activity: Yes    Partners: Male    Birth control/protection: Surgical    Comment: Husband  Other Topics Concern   Not on file  Social History Narrative   [employer]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    Lives with husband and 2 children    Son- 2 yo and daughter 79  yo    Pets: 2 dogs    Caffeine- 2 sodas 20 oz, coffee 1 cup occasionally    Enjoys being outside with family       DPR mom Horton Chin [phone]    Social Determinants of Health   Financial Resource Strain: Not on file  Food Insecurity: Not on file  Transportation Needs: Not on file  Physical Activity: Not on file  Stress: Not on file  Social Connections: Not on file  Intimate Partner Violence: Not on file    Past Medical History, Surgical history, Social history, and Family history were reviewed and updated as appropriate.   Please see review of systems for further details on the patient's review from today.   Objective:   Physical Exam:  LMP  (LMP Unknown)   Physical Exam Constitutional:      General: She is not in acute distress. Musculoskeletal:        General: No deformity.  Neurological:     Mental Status: She is alert and oriented to person, place, and time.     Cranial Nerves: No dysarthria.     Coordination: Coordination normal.  Psychiatric:        Attention and Perception: Attention and perception normal. She does not perceive  auditory or visual hallucinations.        Mood and Affect: Mood is anxious and depressed. Affect is not labile, blunt, angry or inappropriate.        Speech: Speech normal.        Behavior: Behavior normal. Behavior is cooperative.        Thought Content: Thought content is paranoid. Thought content is not delusional. Thought content does not include homicidal or suicidal ideation. Thought content does not include homicidal or suicidal plan.        Cognition and Memory: Cognition and memory normal.        Judgment: Judgment normal.     Comments: Insight intact Her psychiatric symptoms are worse as noted.  She states she has been accused of being paranoid.    Lab Review:     Component Value Date/Time   NA 140 05/24/2020 1701   NA 138 11/25/2014 2152   K 4.0 05/24/2020 1701   K 3.7 11/25/2014 2152   CL 99 05/24/2020 1701    CL 105 11/25/2014 2152   CO2 23 05/24/2020 1701   CO2 28 11/25/2014 2152   GLUCOSE 97 05/24/2020 1701   GLUCOSE 108 (H) 12/26/2016 0812   GLUCOSE 98 11/25/2014 2152   BUN 11 05/24/2020 1701   BUN 10 11/25/2014 2152   CREATININE 0.86 05/24/2020 1701   CREATININE 0.81 11/25/2014 2152   CALCIUM 10.3 (H) 05/24/2020 1701   CALCIUM 10.0 11/25/2014 2152   PROT 6.9 05/24/2020 1701   PROT 7.2 11/25/2014 2152   ALBUMIN 4.6 05/24/2020 1701   ALBUMIN 4.1 11/25/2014 2152   AST 13 05/24/2020 1701   AST 15 11/25/2014 2152   ALT 13 05/24/2020 1701   ALT 13 (L) 11/25/2014 2152   ALKPHOS 108 05/24/2020 1701   ALKPHOS 61 11/25/2014 2152   BILITOT 0.2 05/24/2020 1701   BILITOT 0.4 11/25/2014 2152   GFRNONAA 78 05/24/2020 1701   GFRNONAA >60 11/25/2014 2152   GFRAA 90 05/24/2020 1701   GFRAA >60 11/25/2014 2152       Component Value Date/Time   WBC 6.4 06/14/2020 1625   WBC 4.9 07/30/2018 1033   RBC 4.83 06/14/2020 1625   RBC 4.82 07/30/2018 1033   HGB 13.9 06/14/2020 1625   HCT 41.7 06/14/2020 1625   PLT 367 06/14/2020 1625   MCV 86 06/14/2020 1625   MCV 77 (L) 11/25/2014 2152   MCH 28.8 06/14/2020 1625   MCH 22.4 (L) 07/30/2018 1033   MCHC 33.3 06/14/2020 1625   MCHC 29.8 (L) 07/30/2018 1033   RDW 13.2 06/14/2020 1625   RDW 22.4 (H) 11/25/2014 2152   LYMPHSABS 2.3 05/24/2020 1701   LYMPHSABS 2.4 03/27/2013 0500   MONOABS 0.4 07/30/2018 1033   MONOABS 0.4 03/27/2013 0500   EOSABS 0.1 05/24/2020 1701   EOSABS 0.2 03/27/2013 0500   BASOSABS 0.0 05/24/2020 1701   BASOSABS 0.0 03/27/2013 0500    No results found for: POCLITH, LITHIUM   No results found for: PHENYTOIN, PHENOBARB, VALPROATE, CBMZ   .res Assessment: Plan:    Martika was seen today for follow-up, anxiety, depression and other.  Diagnoses and all orders for this visit:  Bipolar 1 disorder, mixed, moderate (HCC)  Panic disorder with agoraphobia  PTSD (post-traumatic stress disorder)  Generalized anxiety  disorder  Claustrophobia  Chronic fatigue  B12 deficiency  Greater than 50% of 30 minutes of non-face to face time with patient was spent on counseling and coordination of care. We discussed Bethsaida has  a history of bipolar disorder with heavy seasonal component .  Unfortunately her mood symptoms are much worse with bipolar mixed symptoms plus panic attacks.  She is under increased stress at work because of her symptoms and that the stress at work is making her symptoms worse as well.  This is of real concern because she does have underlying cardiology problems.  Cardiologist has suggested as needed benzo to help reduce her cardiac symptoms.     She previously had exacerbation of anxiety when she attempted to wean off sertraline.  She has tried all non-antipsychotic mood stabilizers except CBZ which is similar to oxcarbazapine that has been inadequate. Disc jer fears about antipsychotics in detail and she agrees. Discussed potential metabolic side effects associated with atypical antipsychotics, as well as potential risk for movement side effects. Advised pt to contact office if movement side effects occur.   Start Vraylar 1.5 mg daily and if it works will wean other meds.   She agrees this time to take it.  Call if you change your mind or don't tolerate it. If fails consider Latuda or Vraylar. Gave samples Vraylar as backup plan 1.5 mg daily.  Disc job problems.  She has been given a final warning at work.  We discussed the pros and cons of sharing mental health information with her employer.  She feels her job may be in jeopardy.  She was given a note which she can use at her discretion that says that she is under our medical care.  Part of her symptoms include irritability and that we are addressing that through medication changes.    Agree with extending medical leave until August 8 due to depression, mood swings, anger outbursts, crying spells that interfere with ability to do her work and  reduced concentration.  She is also ruminating on her difficulty functioning and on feeling mistreated at work.  This will allow [place] time to make change in the mood stabilizer.  It takes Vraylar 6 weeks to reach steady state once the final dose is achieved.  We may have to increase the dose above the initial starting dose.  Low B12 and vitamin D can contribute to psych problems and can be consequence of AEDs.  We discussed the short-term risks associated with benzodiazepines including sedation and increased fall risk among others.  Discussed long-term side effect risk including dependence, potential withdrawal symptoms, and the potential eventual dose-related risk of dementia.  Disc risk dependence in detail in terms of the amount that can be used without it.  This appointment was 30 minutes  FU 6 weeks  Lynder Parents, MD, DFAPA    Please see After Visit Summary for patient specific instructions.  No future appointments.   No orders of the defined types were placed in this encounter.     -------------------------------

## 2021-03-15 ENCOUNTER — Telehealth: Payer: Self-pay | Admitting: Psychiatry

## 2021-03-15 NOTE — Telephone Encounter (Signed)
Received fax from [employer] regarding Tracie Harrier. Completion needed for disability form. Placed on Traci's desk

## 2021-03-15 NOTE — Telephone Encounter (Signed)
No I just received it when I was in. As long as the form doesn't specifically ask for MD signature like the State ones it will work for someone else to sign it.

## 2021-03-15 NOTE — Telephone Encounter (Signed)
Not sure if you did this one already or not but if so I can let her know

## 2021-03-15 NOTE — Telephone Encounter (Signed)
Pt called in about disability form being filled out. States she needs it filled out ASAP as she won't get be paid until form is filled and sent back in. She is aware that CC is on vacation and asked if nurse could fill out. [place] RTC [phone]

## 2021-03-16 NOTE — Telephone Encounter (Signed)
Forms for [employer] completed and will have Donnal Moat PA-C sign off on them while Dr. Clovis Pu out of office.

## 2021-03-17 NOTE — Telephone Encounter (Signed)
Already done and faxed

## 2021-03-17 NOTE — Telephone Encounter (Signed)
Beth, can you have Alvester Chou, or Aaron Edelman sign this please?

## 2021-03-23 ENCOUNTER — Other Ambulatory Visit: Payer: Self-pay | Admitting: Psychiatry

## 2021-03-23 ENCOUNTER — Telehealth: Payer: Self-pay | Admitting: Psychiatry

## 2021-03-23 DIAGNOSIS — F3162 Bipolar disorder, current episode mixed, moderate: Secondary | ICD-10-CM

## 2021-03-23 MED ORDER — CARIPRAZINE HCL 1.5 MG PO CAPS: 1.5000 mg | ORAL_CAPSULE | Freq: Every day | ORAL | 1 refills | Status: DC

## 2021-03-23 NOTE — Telephone Encounter (Signed)
Please send in vraylar or let me know the dose

## 2021-03-23 NOTE — Telephone Encounter (Signed)
Patient lm to cancel appt on 8/5 stating if she was doing well per Dr Clovis Pu she didn't need it. She rescheduled for October next available. Patient will need a Rx for the Vraylar for samples were given at her last visit.

## 2021-03-23 NOTE — Telephone Encounter (Signed)
Sent Vraylar 1.5 mg daily

## 2021-03-25 ENCOUNTER — Ambulatory Visit: Payer: 59 | Admitting: Psychiatry

## 2021-03-28 DIAGNOSIS — M546 Pain in thoracic spine: Secondary | ICD-10-CM | POA: Insufficient documentation

## 2021-04-16 ENCOUNTER — Other Ambulatory Visit: Payer: Self-pay | Admitting: Psychiatry

## 2021-04-16 DIAGNOSIS — F319 Bipolar disorder, unspecified: Secondary | ICD-10-CM

## 2021-04-18 ENCOUNTER — Telehealth: Payer: Self-pay | Admitting: Psychiatry

## 2021-04-18 NOTE — Telephone Encounter (Signed)
FYI not sure if we have tried doing a PA

## 2021-04-18 NOTE — Telephone Encounter (Signed)
I don't see that Abilify is on her prior med list trials.  It's similar to [employer] and generic.  Has she taken it before?  It's an easy substitute.

## 2021-04-18 NOTE — Telephone Encounter (Signed)
Pt left a message that the vraylar is to expensive and she will not be taking this medicine.

## 2021-04-19 ENCOUNTER — Other Ambulatory Visit: Payer: Self-pay | Admitting: Psychiatry

## 2021-04-19 DIAGNOSIS — F3162 Bipolar disorder, current episode mixed, moderate: Secondary | ICD-10-CM

## 2021-04-19 DIAGNOSIS — F4001 Agoraphobia with panic disorder: Secondary | ICD-10-CM

## 2021-04-19 MED ORDER — LORAZEPAM 1 MG PO TABS: 1.0000 mg | ORAL_TABLET | Freq: Three times a day (TID) | ORAL | 3 refills | Status: DC

## 2021-04-19 MED ORDER — ARIPIPRAZOLE 10 MG PO TABS: ORAL_TABLET | ORAL | 1 refills | Status: DC

## 2021-04-19 NOTE — Telephone Encounter (Signed)
reviewed

## 2021-04-19 NOTE — Telephone Encounter (Signed)
RX sent for abilify 10 mg and lorazepam

## 2021-04-19 NOTE — Telephone Encounter (Signed)
[facility] [id number] - [place], [name] [name] is using this pharmacy and she has not tried Abilify before but she would like to.Also requesting refill for ativan

## 2021-04-26 ENCOUNTER — Emergency Department
Admission: EM | Admit: 2021-04-26 | Discharge: 2021-04-27 | Disposition: A | Discharge status: Home / Self Care | Payer: Managed Care, Other (non HMO) | Attending: Emergency Medicine | Admitting: Emergency Medicine

## 2021-04-26 ENCOUNTER — Other Ambulatory Visit: Payer: Self-pay

## 2021-04-26 ENCOUNTER — Encounter: Payer: Self-pay | Admitting: Emergency Medicine

## 2021-04-26 DIAGNOSIS — Z20822 Contact with and (suspected) exposure to covid-19: Secondary | ICD-10-CM | POA: Diagnosis not present

## 2021-04-26 DIAGNOSIS — Z87891 Personal history of nicotine dependence: Secondary | ICD-10-CM | POA: Insufficient documentation

## 2021-04-26 DIAGNOSIS — F319 Bipolar disorder, unspecified: Secondary | ICD-10-CM | POA: Diagnosis not present

## 2021-04-26 DIAGNOSIS — Y908 Blood alcohol level of 240 mg/100 ml or more: Secondary | ICD-10-CM | POA: Diagnosis not present

## 2021-04-26 DIAGNOSIS — T424X2A Poisoning by benzodiazepines, intentional self-harm, initial encounter: Secondary | ICD-10-CM | POA: Insufficient documentation

## 2021-04-26 DIAGNOSIS — Z046 Encounter for general psychiatric examination, requested by authority: Secondary | ICD-10-CM | POA: Insufficient documentation

## 2021-04-26 DIAGNOSIS — B009 Herpesviral infection, unspecified: Secondary | ICD-10-CM | POA: Diagnosis present

## 2021-04-26 DIAGNOSIS — D649 Anemia, unspecified: Secondary | ICD-10-CM | POA: Diagnosis present

## 2021-04-26 DIAGNOSIS — I4891 Unspecified atrial fibrillation: Secondary | ICD-10-CM | POA: Diagnosis not present

## 2021-04-26 DIAGNOSIS — F4310 Post-traumatic stress disorder, unspecified: Secondary | ICD-10-CM | POA: Diagnosis present

## 2021-04-26 DIAGNOSIS — T50902A Poisoning by unspecified drugs, medicaments and biological substances, intentional self-harm, initial encounter: Secondary | ICD-10-CM

## 2021-04-26 DIAGNOSIS — F3130 Bipolar disorder, current episode depressed, mild or moderate severity, unspecified: Secondary | ICD-10-CM | POA: Diagnosis present

## 2021-04-26 DIAGNOSIS — R1084 Generalized abdominal pain: Secondary | ICD-10-CM | POA: Diagnosis present

## 2021-04-26 DIAGNOSIS — Z79899 Other long term (current) drug therapy: Secondary | ICD-10-CM | POA: Diagnosis not present

## 2021-04-26 DIAGNOSIS — I10 Essential (primary) hypertension: Secondary | ICD-10-CM

## 2021-04-26 DIAGNOSIS — Z91040 Latex allergy status: Secondary | ICD-10-CM | POA: Diagnosis not present

## 2021-04-26 LAB — CBC
HCT: 40.4 % (ref 36.0–46.0)
Hemoglobin: 14 g/dL (ref 12.0–15.0)
MCH: 29.8 pg (ref 26.0–34.0)
MCHC: 34.7 g/dL (ref 30.0–36.0)
MCV: 86 fL (ref 80.0–100.0)
Platelets: 298 10*3/uL (ref 150–400)
RBC: 4.7 MIL/uL (ref 3.87–5.11)
RDW: 13.5 % (ref 11.5–15.5)
WBC: 4.5 10*3/uL (ref 4.0–10.5)
nRBC: 0 % (ref 0.0–0.2)

## 2021-04-26 LAB — URINE DRUG SCREEN, QUALITATIVE (ARMC ONLY)
Amphetamines, Ur Screen: NOT DETECTED
Barbiturates, Ur Screen: NOT DETECTED
Benzodiazepine, Ur Scrn: POSITIVE — AB
Cannabinoid 50 Ng, Ur ~~LOC~~: POSITIVE — AB
Cocaine Metabolite,Ur ~~LOC~~: NOT DETECTED
MDMA (Ecstasy)Ur Screen: NOT DETECTED
Methadone Scn, Ur: NOT DETECTED
Opiate, Ur Screen: NOT DETECTED
Phencyclidine (PCP) Ur S: NOT DETECTED
Tricyclic, Ur Screen: NOT DETECTED

## 2021-04-26 LAB — COMPREHENSIVE METABOLIC PANEL
ALT: 19 U/L (ref 0–44)
AST: 19 U/L (ref 15–41)
Albumin: 4.2 g/dL (ref 3.5–5.0)
Alkaline Phosphatase: 88 U/L (ref 38–126)
Anion gap: 8 (ref 5–15)
BUN: 11 mg/dL (ref 6–20)
CO2: 25 mmol/L (ref 22–32)
Calcium: 10.1 mg/dL (ref 8.9–10.3)
Chloride: 106 mmol/L (ref 98–111)
Creatinine, Ser: 0.84 mg/dL (ref 0.44–1.00)
GFR, Estimated: >60 mL/min (ref >60)
Glucose, Bld: 96 mg/dL (ref 70–99)
Potassium: 3.8 mmol/L (ref 3.5–5.1)
Sodium: 139 mmol/L (ref 135–145)
Total Bilirubin: 0.6 mg/dL (ref 0.3–1.2)
Total Protein: 7.4 g/dL (ref 6.5–8.1)

## 2021-04-26 LAB — RESP PANEL BY RT-PCR (FLU A&B, COVID) ARPGX2
Influenza A by PCR: NEGATIVE
Influenza B by PCR: NEGATIVE
SARS Coronavirus 2 by RT PCR: NEGATIVE

## 2021-04-26 LAB — SALICYLATE LEVEL: Salicylate Lvl: <7 mg/dL — ABNORMAL LOW (ref 7.0–30.0)

## 2021-04-26 LAB — PREGNANCY, URINE: Preg Test, Ur: NEGATIVE

## 2021-04-26 LAB — ETHANOL: Alcohol, Ethyl (B): <10 mg/dL (ref <10)

## 2021-04-26 LAB — ACETAMINOPHEN LEVEL: Acetaminophen (Tylenol), Serum: <10 ug/mL — ABNORMAL LOW (ref 10–30)

## 2021-04-26 MED ORDER — SERTRALINE HCL 100 MG PO TABS
100.0000 mg | ORAL_TABLET | Freq: Every day | ORAL | Status: DC
  Administered 2021-04-26: 100 mg via ORAL
  Filled 2021-04-26 (×2): qty 1

## 2021-04-26 MED ORDER — OXCARBAZEPINE 300 MG PO TABS
300.0000 mg | ORAL_TABLET | Freq: Every day | ORAL | Status: DC
  Administered 2021-04-26: 300 mg via ORAL
  Filled 2021-04-26 (×2): qty 1

## 2021-04-26 MED ORDER — PANTOPRAZOLE SODIUM 40 MG PO TBEC
40.0000 mg | DELAYED_RELEASE_TABLET | Freq: Every day | ORAL | Status: DC
  Administered 2021-04-26: 40 mg via ORAL
  Filled 2021-04-26 (×2): qty 1

## 2021-04-26 MED ORDER — LAMOTRIGINE 100 MG PO TABS
200.0000 mg | ORAL_TABLET | Freq: Every day | ORAL | Status: DC
  Administered 2021-04-26: 200 mg via ORAL
  Filled 2021-04-26: qty 2

## 2021-04-26 NOTE — ED Provider Notes (Signed)
[facility] Emergency Department Provider Note ____________________________________________   Event Date/Time   First MD Initiated Contact with Patient 04/26/21 1635     (approximate)  I have reviewed the triage vital signs and the nursing notes.  HISTORY  Chief Complaint Suicide Attempt   HPI [name] [name] is a 55 y.o. femalewho presents to the ED for evaluation of intentional overdose.   Chart review indicates hx anxiety and depression. Bipolar disorder.  Paroxysmal A. fib and hypertension.  No anticoagulation.  Patient presents to the ED under IVC after intentionally taking 5 tablets of lorazepam.  She reports a poor relationship with her mother, and getting into a verbal disagreement with her mother over the phone this afternoon.  She reports her mother repeatedly told her "finally just kill yourself."  Patient reports becoming quite emotionally distressed and she took 4 or 5 tablets of 0.5 lorazepam intentionally trying to harm herself.  Reports also ingesting a couple sips of wine, but denies additional recreational drugs.  Denies recent illnesses, fever, syncopal episodes, trauma or injuries.  Past Medical History:  Diagnosis Date   Anxiety    Arthritis    B12 deficiency    Chicken pox    Depression    Fibroid    GERD (gastroesophageal reflux disease)    History of hiatal hernia    Hypertension    Iron deficiency anemia    Menorrhagia    Migraines    PAF (paroxysmal atrial fibrillation) ([place])    a. 04/2013 Echo: EF 55-60%, impaired relaxation. Mild MR/TR. Nl RV fxn; b. CHA2DS2VASc = 2; c. PRN flecainide.   Snores     Patient Active Problem List   Diagnosis Date Noted   Generalized abdominal pain 05/24/2020   Gastric polyp    Esophageal dysphagia    Polyp of sigmoid colon    Hypertrophy of anal papillae    Diverticulosis of large intestine without diverticulitis    HLD (hyperlipidemia) 03/12/2019   Vitamin D deficiency 03/12/2019    History of IBS 03/06/2019   Pure hypertriglyceridemia 02/27/2019   Menopause 02/27/2019   Uterus, adenomyosis 08/07/2018   S/P total hysterectomy and BSO (bilateral salpingo-oophorectomy) 08/05/2018   B12 deficiency 06/04/2018   PTSD (post-traumatic stress disorder) 05/14/2018   HSV (herpes simplex virus) infection 11/27/2017   Anemia 03/08/2017   HNP (herniated nucleus pulposus), cervical 07/29/2015   Cervical radiculitis 04/22/2015   Bipolar I disorder, most recent episode depressed ([place]) 12/10/2014   GERD (gastroesophageal reflux disease) 12/10/2014   Migraines 12/10/2014   PAF (paroxysmal atrial fibrillation) ([place]) 04/10/2013    Past Surgical History:  Procedure Laterality Date   ABDOMINAL HYSTERECTOMY     APPENDECTOMY  5/11   C-spine surgery     CESAREAN SECTION  10/98,9/12   COLONOSCOPY WITH PROPOFOL N/A 03/26/2019   Procedure: COLONOSCOPY WITH PROPOFOL;  Surgeon: Virgel Manifold, MD;  Location: [facility] ENDOSCOPY;  Service: Endoscopy;  Laterality: N/A;   ESOPHAGOGASTRODUODENOSCOPY (EGD) WITH PROPOFOL N/A 03/26/2019   Procedure: ESOPHAGOGASTRODUODENOSCOPY (EGD) WITH PROPOFOL;  Surgeon: Virgel Manifold, MD;  Location: [facility] ENDOSCOPY;  Service: Endoscopy;  Laterality: N/A;   HYSTERECTOMY ABDOMINAL WITH SALPINGO-OOPHORECTOMY Bilateral 08/05/2018   Procedure: HYSTERECTOMY ABDOMINAL WITH BILATERAL SALPINGO-OOPHORECTOMY;  Surgeon: Brayton Mars, MD;  Location: [facility] ORS;  Service: Gynecology;  Laterality: Bilateral;   TONSILLECTOMY  03/2008   TUBAL LIGATION      Prior to Admission medications   Medication Sig Start Date End Date Taking? Authorizing Provider  ARIPiprazole (ABILIFY) 10  MG tablet 1/2 tablet daily for 1 week then 1 daily 04/19/21   Cottle, Billey Co., MD  cyclobenzaprine (FLEXERIL) 5 MG tablet Take 1 tablet (5 mg total) by mouth 3 (three) times daily as needed. 01/18/21   Menshew, Dannielle Karvonen, PA-C  dicyclomine (BENTYL) 20 MG tablet Take 1 tablet (20 mg  total) by mouth 3 (three) times daily before meals. 02/15/21   Lucilla Lame, MD  ibuprofen (ADVIL) 200 MG tablet Takes 1-3 tablets as needed    [provider]  lamoTRIgine (LAMICTAL) 200 MG tablet TAKE 1 TABLET BY MOUTH  EVERY NIGHT AT BEDTIME 06/28/20   Cottle, Billey Co., MD  LORazepam (ATIVAN) 1 MG tablet Take 1 tablet (1 mg total) by mouth every 8 (eight) hours. 04/19/21   Cottle, Billey Co., MD  losartan (COZAAR) 25 MG tablet Take 0.5 tablets (12.5 mg total) by mouth daily. 12/02/20   Loel Dubonnet, NP  Multiple Vitamin (MULTIVITAMIN WITH MINERALS) TABS tablet Take 1 tablet by mouth at bedtime.    [provider]  omeprazole (PRILOSEC) 40 MG capsule Take 1 capsule (40 mg total) by mouth at bedtime. 02/15/21   Lucilla Lame, MD  Oxcarbazepine (TRILEPTAL) 300 MG tablet TAKE 1 TABLET BY MOUTH EVERY MORNING AND 3 TABLETS AT NIGHT 04/18/21   Cottle, Billey Co., MD  pantoprazole (PROTONIX) 40 MG tablet Take 1 tablet by mouth daily. 09/28/20   [provider]  sertraline (ZOLOFT) 100 MG tablet TAKE 1 TABLET BY MOUTH  DAILY 06/21/20   Cottle, Billey Co., MD    Allergies Phenylmercuric nitrate, Augmentin [amoxicillin-pot clavulanate], Codeine, Diclofenac, Latex, Neomycin, and Percocet [oxycodone-acetaminophen]  Family History  Problem Relation Age of Onset   Diabetes Mother    Alcohol abuse Father    Arthritis Father    Heart disease Father    Arthritis Sister    Alcohol abuse Maternal Aunt    Alcohol abuse Maternal Uncle    Alcohol abuse Paternal Grandfather    Lung cancer Paternal Grandfather        smoker    Arthritis Maternal Grandmother    Schizophrenia Maternal Grandmother    Leukemia Maternal Grandfather    Breast cancer Neg Hx     Social History Social History   Tobacco Use   Smoking status: Former    Packs/day: 1.00    Years: 10.00    Pack years: 10.00    Types: Cigarettes    Quit date: 08/22/1991    Years since quitting: 29.6   Smokeless  tobacco: Never  Vaping Use   Vaping Use: Never used  Substance Use Topics   Alcohol use: Not Currently    Alcohol/week: 0.0 standard drinks    Comment: twice a year    Drug use: Not Currently    Types: Marijuana    Comment: not for a couple years    Review of Systems  Constitutional: No fever/chills Eyes: No visual changes. ENT: No sore throat. Cardiovascular: Denies chest pain. Respiratory: Denies shortness of breath. Gastrointestinal: No abdominal pain.  No nausea, no vomiting.  No diarrhea.  No constipation. Genitourinary: Negative for dysuria. Musculoskeletal: Negative for back pain. Skin: Negative for rash. Neurological: Negative for headaches, focal weakness or numbness.  ____________________________________________   PHYSICAL EXAM:  VITAL SIGNS: Vitals:   04/26/21 1610  BP: (!) 159/107  Pulse: (!) 104  Resp: 18  Temp: 98.6 F (37 C)  SpO2: 97%    Constitutional: Alert and oriented. Well appearing  and in no acute distress. Eyes: Conjunctivae are normal. PERRL. EOMI. Head: Atraumatic. Nose: No congestion/rhinnorhea. Mouth/Throat: Mucous membranes are moist.  Oropharynx non-erythematous. Neck: No stridor. No cervical spine tenderness to palpation. Cardiovascular: Normal rate, regular rhythm. Grossly normal heart sounds.  Good peripheral circulation. Respiratory: Normal respiratory effort.  No retractions. Lungs CTAB. Gastrointestinal: Soft , nondistended, nontender to palpation. No CVA tenderness. Musculoskeletal: No lower extremity tenderness nor edema.  No joint effusions. No signs of acute trauma. Neurologic:  Normal speech and language. No gross focal neurologic deficits are appreciated. No gait instability noted. Skin:  Skin is warm, dry and intact. No rash noted. Psychiatric: Mood and affect are flat. Speech and behavior are normal. ____________________________________________   LABS (all labs ordered are listed, but only abnormal results are  displayed)  Labs Reviewed  SALICYLATE LEVEL - Abnormal; Notable for the following components:      Result Value   Salicylate Lvl [id number] (*)    All other components within normal limits  ACETAMINOPHEN LEVEL - Abnormal; Notable for the following components:   Acetaminophen (Tylenol), Serum <10 (*)    All other components within normal limits  COMPREHENSIVE METABOLIC PANEL  ETHANOL  CBC  URINE DRUG SCREEN, QUALITATIVE ([facility] ONLY)  POC URINE PREG, ED   ____________________________________________  12 Lead EKG   ____________________________________________  RADIOLOGY  ED MD interpretation:    Official radiology report(s): No results found.  ____________________________________________   PROCEDURES and INTERVENTIONS  Procedure(s) performed (including Critical Care):  Procedures  Medications - No data to display  ____________________________________________   MDM / ED COURSE   55 year old woman presents to the ED under IVC after intentionally taking too many lorazepams in an attempt to harm herself after an emotional disagreement with her mother, requiring psychiatric evaluation for disposition.  She looks clinically well to me without significant somnolence or clinical stigmata of benzodiazepine overdose.  No evidence of additional toxidromes.  No evidence of trauma, neurologic or vascular deficits.  Her blood work is benign.  No medical barriers to psychiatric evaluation and disposition.  We will hold her under IVC until she is evaluated.  Clinical Course as of 04/26/21 1812  Tue Apr 26, 2021  1806 The patient has been placed in psychiatric observation due to the need to provide a safe environment for the patient while obtaining psychiatric consultation and evaluation, as well as ongoing medical and medication management to treat the patient's condition.  The patient has been placed under full IVC at this time.   [DS]    Clinical Course User Index [DS] Vladimir Crofts, MD     ____________________________________________   FINAL CLINICAL IMPRESSION(S) / ED DIAGNOSES  Final diagnoses:  Intentional drug overdose, initial encounter [facility])     ED Discharge Orders     None        Lilianna Case   Note:  This document was prepared using Dragon voice recognition software and may include unintentional dictation errors.    Vladimir Crofts, MD 04/26/21 [phone]

## 2021-04-26 NOTE — ED Triage Notes (Signed)
Pt brought in by [facility], pt took 4 or 5 (.'25mg'$  of Lorazepam). She said at first it was a SA but now she states that it was spiteful for her mother. She did it because her mother gets on her nerves and she just wanted her to shut up. She does not live with her mother.

## 2021-04-26 NOTE — ED Notes (Signed)
Pt to restroom, pt exits restroom with no pants on after getting pants wet. Walks through unit without pants back to room. Pt agitated that she does not have a private restroom.

## 2021-04-26 NOTE — ED Notes (Signed)
Pt given phone to speak with family  

## 2021-04-26 NOTE — ED Notes (Signed)
Pt requests medication to assist with sleep tonight, informed that this nurse will speak to Kennyth Lose, NP with request.

## 2021-04-26 NOTE — ED Notes (Signed)
Pt argumentative with staff that the medicates do not look like her home medicates. Pt is educated on various medication dosages and brands. Pt takes medications.

## 2021-04-26 NOTE — ED Notes (Signed)
Pt completes psych assessment with Kennyth Lose NP and [name] TTS. Psych team request pt to have phone to notify family that she will be here tonight. Pt is provided with phone, educated on how to dial out, and allowed time to make phone call. Pt returns phone when finished.

## 2021-04-26 NOTE — ED Notes (Signed)
IVC PENDING  CONSULT ?

## 2021-04-26 NOTE — Consult Note (Signed)
[facility] Face-to-Face Psychiatry Consult   Reason for Consult: Suicide Attempt  Referring Physician: Dr. Tamala Julian Patient Identification: [name] [name] MRN:  [id number] Principal Diagnosis: <principal problem not specified> Diagnosis:  Active Problems:   Bipolar I disorder, most recent episode depressed (HCC)   Anemia   HSV (herpes simplex virus) infection   PTSD (post-traumatic stress disorder)   Generalized abdominal pain   Total Time spent with patient: 1 hour  Subjective: "My mother knows how to get me upset." [name] [name] is a 55 y.o. female patient presented to [facility] ED via law enforcement under Involuntary Commitment status (IVC). Reports the patient arrived at the ED irritated due to her getting into an argument with her mom. Her mom said some "ugly" things to her. The patient voiced her mom is her tricker. She stated, "she told me to go ahead and kill myself. I grabbed my bottle and took 4 to 5 tablets of my 0.5 lorazepam, intentionally trying to harm myself," The patient explained that the dynamics between her and her mother had worsened since her sister's death last May 12, 2023. The patient's sister's death anniversary is today April 28, 2020). The patient has a hx of bipolar and reported that she receives medication management from her outpatient provider, Dr. Clovis Pu. The patient was reluctant to discuss substance use; however, when confronted about having a UDS positive for cannabis, the pt was forthcoming, reporting that she smokes regularly 1-2x per week. The patient denied alcohol use. The patient denied symptoms of depression or appetite disturbance. The patient said that she does have trouble staying asleep. The patient was seen face-to-face by this provider; the chart was reviewed and consulted with Dr. Tamala Julian on 04/28/21 due to the patient's care. It was discussed with the EDP that the patient remained under observation overnight and will be reassessed in the a.m. to determine  if she meets the criteria for psychiatric inpatient admission; she could be discharged home. On evaluation, the patient is alert and oriented x4, calm and cooperative, and mood-congruent with affect. The patient does not appear to be responding to internal or external stimuli. Neither is the patient presenting with any delusional thinking. The patient denies auditory or visual hallucinations. The patient denies any suicidal, homicidal, or self-harm ideations. The patient is not presenting with any psychotic or paranoid behaviors. During an encounter with the patient, she could answer questions appropriately.  HPI: Per Dr. Tamala Julian, [name] [name] is a 55 y.o. femalewho presents to the ED for evaluation of intentional overdose.    Chart review indicates hx anxiety and depression. Bipolar disorder.  Paroxysmal A. fib and hypertension.  No anticoagulation.   Patient presents to the ED under IVC after intentionally taking 5 tablets of lorazepam.  She reports a poor relationship with her mother, and getting into a verbal disagreement with her mother over the phone this afternoon.  She reports her mother repeatedly told her "finally just kill yourself."   Patient reports becoming quite emotionally distressed and she took 4 or 5 tablets of 0.5 lorazepam intentionally trying to harm herself.  Reports also ingesting a couple sips of wine, but denies additional recreational drugs.   Denies recent illnesses, fever, syncopal episodes, trauma or injuries.  Past Psychiatric History:  Anxiety Depression  Risk to Self:   Risk to Others:   Prior Inpatient Therapy:   Prior Outpatient Therapy:    Past Medical History:  Past Medical History:  Diagnosis Date   Anxiety    Arthritis  B12 deficiency    Chicken pox    Depression    Fibroid    GERD (gastroesophageal reflux disease)    History of hiatal hernia    Hypertension    Iron deficiency anemia    Menorrhagia    Migraines    PAF (paroxysmal atrial  fibrillation) ([place])    a. 04/2013 Echo: EF 55-60%, impaired relaxation. Mild MR/TR. Nl RV fxn; b. CHA2DS2VASc = 2; c. PRN flecainide.   Snores     Past Surgical History:  Procedure Laterality Date   ABDOMINAL HYSTERECTOMY     APPENDECTOMY  5/11   C-spine surgery     CESAREAN SECTION  10/98,9/12   COLONOSCOPY WITH PROPOFOL N/A 03/26/2019   Procedure: COLONOSCOPY WITH PROPOFOL;  Surgeon: Virgel Manifold, MD;  Location: [facility] ENDOSCOPY;  Service: Endoscopy;  Laterality: N/A;   ESOPHAGOGASTRODUODENOSCOPY (EGD) WITH PROPOFOL N/A 03/26/2019   Procedure: ESOPHAGOGASTRODUODENOSCOPY (EGD) WITH PROPOFOL;  Surgeon: Virgel Manifold, MD;  Location: [facility] ENDOSCOPY;  Service: Endoscopy;  Laterality: N/A;   HYSTERECTOMY ABDOMINAL WITH SALPINGO-OOPHORECTOMY Bilateral 08/05/2018   Procedure: HYSTERECTOMY ABDOMINAL WITH BILATERAL SALPINGO-OOPHORECTOMY;  Surgeon: Brayton Mars, MD;  Location: [facility] ORS;  Service: Gynecology;  Laterality: Bilateral;   TONSILLECTOMY  03/2008   TUBAL LIGATION     Family History:  Family History  Problem Relation Age of Onset   Diabetes Mother    Alcohol abuse Father    Arthritis Father    Heart disease Father    Arthritis Sister    Alcohol abuse Maternal Aunt    Alcohol abuse Maternal Uncle    Alcohol abuse Paternal Grandfather    Lung cancer Paternal Grandfather        smoker    Arthritis Maternal Grandmother    Schizophrenia Maternal Grandmother    Leukemia Maternal Grandfather    Breast cancer Neg Hx    Family Psychiatric  History:  Social History:  Social History   Substance and Sexual Activity  Alcohol Use Not Currently   Alcohol/week: 0.0 standard drinks   Comment: twice a year      Social History   Substance and Sexual Activity  Drug Use Not Currently   Types: Marijuana   Comment: not for a couple years    Social History   Socioeconomic History   Marital status: Legally Separated    Spouse name: Not on file   Number of children:  Not on file   Years of education: Not on file   Highest education level: Not on file  Occupational History   Not on file  Tobacco Use   Smoking status: Former    Packs/day: 1.00    Years: 10.00    Pack years: 10.00    Types: Cigarettes    Quit date: 08/22/1991    Years since quitting: 29.6   Smokeless tobacco: Never  Vaping Use   Vaping Use: Never used  Substance and Sexual Activity   Alcohol use: Not Currently    Alcohol/week: 0.0 standard drinks    Comment: twice a year    Drug use: Not Currently    Types: Marijuana    Comment: not for a couple years   Sexual activity: Yes    Partners: Male    Birth control/protection: Surgical    Comment: Husband  Other Topics Concern   Not on file  Social History Narrative   [employer]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    Lives with husband and 2 children    Son- 53 yo  and daughter 50 yo    Pets: 2 dogs    Caffeine- 2 sodas 20 oz, coffee 1 cup occasionally    Enjoys being outside with family       DPR mom Horton Chin [phone]    Social Determinants of Health   Financial Resource Strain: Not on file  Food Insecurity: Not on file  Transportation Needs: Not on file  Physical Activity: Not on file  Stress: Not on file  Social Connections: Not on file   Additional Social History:    Allergies:   Allergies  Allergen Reactions   Phenylmercuric Nitrate Rash   Augmentin [Amoxicillin-Pot Clavulanate] Nausea Only    Has patient had a PCN reaction causing immediate rash, facial/tongue/throat swelling, SOB or lightheadedness with hypotension: No Has patient had a PCN reaction causing severe rash involving mucus membranes or skin necrosis: No Has patient had a PCN reaction that required hospitalization: No Has patient had a PCN reaction occurring within the last 10 years: Yes If all of the above answers are "NO", then may proceed with Cephalosporin use.    Codeine Nausea Only   Diclofenac Nausea Only   Latex Rash   Neomycin  Rash   Percocet [Oxycodone-Acetaminophen] Rash    Hives     Labs:  Results for orders placed or performed during the hospital encounter of 04/26/21 (from the past 48 hour(s))  Urine Drug Screen, Qualitative     Status: Abnormal   Collection Time: 04/26/21  4:13 PM  Result Value Ref Range   Tricyclic, Ur Screen NONE DETECTED NONE DETECTED   Amphetamines, Ur Screen NONE DETECTED NONE DETECTED   MDMA (Ecstasy)Ur Screen NONE DETECTED NONE DETECTED   Cocaine Metabolite,Ur [place] NONE DETECTED NONE DETECTED   Opiate, Ur Screen NONE DETECTED NONE DETECTED   Phencyclidine (PCP) Ur S NONE DETECTED NONE DETECTED   Cannabinoid 50 Ng, Ur [place] POSITIVE (A) NONE DETECTED   Barbiturates, Ur Screen NONE DETECTED NONE DETECTED   Benzodiazepine, Ur Scrn POSITIVE (A) NONE DETECTED   Methadone Scn, Ur NONE DETECTED NONE DETECTED    Comment: (NOTE) Tricyclics + metabolites, urine    Cutoff 1000 ng/mL Amphetamines + metabolites, urine  Cutoff 1000 ng/mL MDMA (Ecstasy), urine              Cutoff 500 ng/mL Cocaine Metabolite, urine          Cutoff 300 ng/mL Opiate + metabolites, urine        Cutoff 300 ng/mL Phencyclidine (PCP), urine         Cutoff 25 ng/mL Cannabinoid, urine                 Cutoff 50 ng/mL Barbiturates + metabolites, urine  Cutoff 200 ng/mL Benzodiazepine, urine              Cutoff 200 ng/mL Methadone, urine                   Cutoff 300 ng/mL  The urine drug screen provides only a preliminary, unconfirmed analytical test result and should not be used for non-medical purposes. Clinical consideration and professional judgment should be applied to any positive drug screen result due to possible interfering substances. A more specific alternate chemical method must be used in order to obtain a confirmed analytical result. Gas chromatography / mass spectrometry (GC/MS) is the preferred confirm atory method. Performed at [facility], [address] [place], [place] [address]    Comprehensive metabolic panel  Status: None   Collection Time: 04/26/21  4:15 PM  Result Value Ref Range   Sodium 139 135 - 145 mmol/L   Potassium 3.8 3.5 - 5.1 mmol/L   Chloride 106 98 - 111 mmol/L   CO2 25 22 - 32 mmol/L   Glucose, Bld 96 70 - 99 mg/dL    Comment: Glucose reference range applies only to samples taken after fasting for at least 8 hours.   BUN 11 6 - 20 mg/dL   Creatinine, Ser 0.84 0.44 - 1.00 mg/dL   Calcium 10.1 8.9 - 10.3 mg/dL   Total Protein 7.4 6.5 - 8.1 g/dL   Albumin 4.2 3.5 - 5.0 g/dL   AST 19 15 - 41 U/L   ALT 19 0 - 44 U/L   Alkaline Phosphatase 88 38 - 126 U/L   Total Bilirubin 0.6 0.3 - 1.2 mg/dL   GFR, Estimated >60 >60 mL/min    Comment: (NOTE) Calculated using the CKD-EPI Creatinine Equation (2021)    Anion gap 8 5 - 15    Comment: Performed at [facility], [address], [place], [place] [address]  Ethanol     Status: None   Collection Time: 04/26/21  4:15 PM  Result Value Ref Range   Alcohol, Ethyl (B) <10 <10 mg/dL    Comment: (NOTE) Lowest detectable limit for serum alcohol is 10 mg/dL.  For medical purposes only. Performed at [facility], [address] [place], [place] [id number]   Salicylate level     Status: Abnormal   Collection Time: 04/26/21  4:15 PM  Result Value Ref Range   Salicylate Lvl Q000111Q (L) 7.0 - 30.0 mg/dL    Comment: Performed at [facility], [address] [place], [place] [address]  Acetaminophen level     Status: Abnormal   Collection Time: 04/26/21  4:15 PM  Result Value Ref Range   Acetaminophen (Tylenol), Serum <10 (L) 10 - 30 ug/mL    Comment: (NOTE) Therapeutic concentrations vary significantly. A range of 10-30 ug/mL  may be an effective concentration for many patients. However, some  are best treated at concentrations outside of this range. Acetaminophen concentrations >150 ug/mL at 4 hours after ingestion  and >50 ug/mL at 12 hours after ingestion are often  associated with  toxic reactions.  Performed at [facility], [address] [place], [place] [address]   cbc     Status: None   Collection Time: 04/26/21  4:15 PM  Result Value Ref Range   WBC 4.5 4.0 - 10.5 K/uL   RBC 4.70 3.87 - 5.11 MIL/uL   Hemoglobin 14.0 12.0 - 15.0 g/dL   HCT 40.4 36.0 - 46.0 %   MCV 86.0 80.0 - 100.0 fL   MCH 29.8 26.0 - 34.0 pg   MCHC 34.7 30.0 - 36.0 g/dL   RDW 13.5 11.5 - 15.5 %   Platelets 298 150 - 400 K/uL   nRBC 0.0 0.0 - 0.2 %    Comment: Performed at [facility], [address], [place], [place] [address]  Resp Panel by RT-PCR (Flu A&B, Covid) Nasopharyngeal Swab     Status: None   Collection Time: 04/26/21  6:43 PM   Specimen: Nasopharyngeal Swab; Nasopharyngeal(NP) swabs in vial transport medium  Result Value Ref Range   SARS Coronavirus 2 by RT PCR NEGATIVE NEGATIVE    Comment: (NOTE) SARS-CoV-2 target nucleic acids are NOT DETECTED.  The SARS-CoV-2 RNA is generally detectable in upper  respiratory specimens during the acute phase of infection. The lowest concentration of SARS-CoV-2 viral copies this assay can detect is 138 copies/mL. A negative result does not preclude SARS-Cov-2 infection and should not be used as the sole basis for treatment or other patient management decisions. A negative result may occur with  improper specimen collection/handling, submission of specimen other than nasopharyngeal swab, presence of viral mutation(s) within the areas targeted by this assay, and inadequate number of viral copies(<138 copies/mL). A negative result must be combined with clinical observations, patient history, and epidemiological information. The expected result is Negative.  Fact Sheet for Patients:  EntrepreneurPulse.com.au  Fact Sheet for Healthcare Providers:  IncredibleEmployment.be  This test is no t yet approved or cleared by the [place] FDA and  has been  authorized for detection and/or diagnosis of SARS-CoV-2 by FDA under an Emergency Use Authorization (EUA). This EUA will remain  in effect (meaning this test can be used) for the duration of the COVID-19 declaration under Section 564(b)(1) of the Act, 21 U.S.C.section 360bbb-3(b)(1), unless the authorization is terminated  or revoked sooner.       Influenza A by PCR NEGATIVE NEGATIVE   Influenza B by PCR NEGATIVE NEGATIVE    Comment: (NOTE) The Xpert Xpress SARS-CoV-2/FLU/RSV plus assay is intended as an aid in the diagnosis of influenza from Nasopharyngeal swab specimens and should not be used as a sole basis for treatment. Nasal washings and aspirates are unacceptable for Xpert Xpress SARS-CoV-2/FLU/RSV testing.  Fact Sheet for Patients: EntrepreneurPulse.com.au  Fact Sheet for Healthcare Providers: IncredibleEmployment.be  This test is not yet approved or cleared by the [place] FDA and has been authorized for detection and/or diagnosis of SARS-CoV-2 by FDA under an Emergency Use Authorization (EUA). This EUA will remain in effect (meaning this test can be used) for the duration of the COVID-19 declaration under Section 564(b)(1) of the Act, 21 U.S.C. section 360bbb-3(b)(1), unless the authorization is terminated or revoked.  Performed at [facility], [address] [place], [place] [address]   Pregnancy, urine     Status: None   Collection Time: 04/26/21  7:58 PM  Result Value Ref Range   Preg Test, Ur NEGATIVE NEGATIVE    Comment: Performed at [facility], [address] [place], [place] [address]    Current Facility-Administered Medications  Medication Dose Route Frequency Provider Last Rate Last Admin   lamoTRIgine (LAMICTAL) tablet 200 mg  200 mg Oral QHS Caroline Sauger, NP       Oxcarbazepine (TRILEPTAL) tablet 300 mg  300 mg Oral Daily Caroline Sauger, NP       pantoprazole (PROTONIX) EC  tablet 40 mg  40 mg Oral Daily Caroline Sauger, NP       sertraline (ZOLOFT) tablet 100 mg  100 mg Oral Daily Caroline Sauger, NP       Current Outpatient Medications  Medication Sig Dispense Refill   ARIPiprazole (ABILIFY) 10 MG tablet 1/2 tablet daily for 1 week then 1 daily 30 tablet 1   cyclobenzaprine (FLEXERIL) 5 MG tablet Take 1 tablet (5 mg total) by mouth 3 (three) times daily as needed. 15 tablet 0   dicyclomine (BENTYL) 20 MG tablet Take 1 tablet (20 mg total) by mouth 3 (three) times daily before meals. 90 tablet 3   ibuprofen (ADVIL) 200 MG tablet Takes 1-3 tablets as needed     lamoTRIgine (LAMICTAL) 200 MG tablet TAKE 1 TABLET BY MOUTH  EVERY NIGHT AT BEDTIME 90 tablet 3  LORazepam (ATIVAN) 1 MG tablet Take 1 tablet (1 mg total) by mouth every 8 (eight) hours. 30 tablet 3   losartan (COZAAR) 25 MG tablet Take 0.5 tablets (12.5 mg total) by mouth daily. 15 tablet 2   Multiple Vitamin (MULTIVITAMIN WITH MINERALS) TABS tablet Take 1 tablet by mouth at bedtime.     omeprazole (PRILOSEC) 40 MG capsule Take 1 capsule (40 mg total) by mouth at bedtime. 30 capsule 6   Oxcarbazepine (TRILEPTAL) 300 MG tablet TAKE 1 TABLET BY MOUTH EVERY MORNING AND 3 TABLETS AT NIGHT 360 tablet 0   pantoprazole (PROTONIX) 40 MG tablet Take 1 tablet by mouth daily.     sertraline (ZOLOFT) 100 MG tablet TAKE 1 TABLET BY MOUTH  DAILY 90 tablet 3    Musculoskeletal: Strength & Muscle Tone: within normal limits Gait & Station: normal Patient leans: N/A  Psychiatric Specialty Exam:  Presentation  General Appearance: Appropriate for Environment  Eye Contact:Good  Speech:Clear and Coherent  Speech Volume:Normal  Handedness:Right   Mood and Affect  Mood:Anxious; Depressed  Affect:Flat; Blunt; Congruent   Thought Process  Thought Processes:Coherent  Descriptions of Associations:Intact  Orientation:Full (Time, Place and Person)  Thought Content:Logical  History of  Schizophrenia/Schizoaffective disorder:No data recorded Duration of Psychotic Symptoms:No data recorded Hallucinations:Hallucinations: None  Ideas of Reference:None  Suicidal Thoughts:Suicidal Thoughts: No  Homicidal Thoughts:Homicidal Thoughts: No   Sensorium  Memory:Immediate Good; Recent Good; Remote Good  Judgment:Fair  Insight:Fair   Executive Functions  Concentration:Fair  Attention Span:Good  [place] of Knowledge:Good  Language:Good   Psychomotor Activity  Psychomotor Activity:Psychomotor Activity: Normal   Assets  Assets:Communication Skills; Desire for Improvement; Physical Health; Resilience; Social Support   Sleep  Sleep:Sleep: Fair   Physical Exam: Physical Exam Vitals and nursing note reviewed.  Constitutional:      Appearance: Normal appearance. She is obese.  HENT:     Head: Normocephalic and atraumatic.     Right Ear: External ear normal.     Left Ear: External ear normal.     Nose: Nose normal.     Mouth/Throat:     Mouth: Mucous membranes are moist.  Cardiovascular:     Rate and Rhythm: Normal rate.     Pulses: Normal pulses.  Pulmonary:     Effort: Pulmonary effort is normal.  Musculoskeletal:        General: Normal range of motion.     Cervical back: Normal range of motion and neck supple.  Skin:    General: Skin is warm.  Neurological:     General: No focal deficit present.     Mental Status: She is alert and oriented to person, place, and time. Mental status is at baseline.  Psychiatric:        Attention and Perception: Attention and perception normal.        Mood and Affect: Mood is anxious and depressed. Affect is blunt and flat.        Speech: Speech normal.        Behavior: Behavior normal. Behavior is cooperative.        Thought Content: Thought content normal.        Cognition and Memory: Cognition and memory normal.        Judgment: Judgment normal.   Review of Systems  Psychiatric/Behavioral:   Positive for depression and substance abuse. The patient is nervous/anxious and has insomnia.   All other systems reviewed and are negative. Blood pressure 136/86, pulse 100, temperature 98.7 F (37.1  C), temperature source Tympanic, resp. rate 20, height '5\' 5"'$  (1.651 m), weight 90.5 kg, SpO2 97 %. Body mass index is 33.2 kg/m.  Treatment Plan Summary: Daily contact with patient to assess and evaluate symptoms and progress in treatment, Medication management, and Plan The patient remained under observation overnight and will be reassessed in the a.m. to determine if she meets the criteria for psychiatric inpatient admission; she could be discharged home.  Disposition: Supportive therapy provided about ongoing stressors. The patient remained under observation overnight and will be reassessed in the a.m. to determine if she meets the criteria for psychiatric inpatient admission; she could be discharged home.  Caroline Sauger, NP 04/26/2021 9:16 PM

## 2021-04-26 NOTE — ED Notes (Signed)
Pt is angry with her mother, blaming her mother for her actions. States her mother caused her to become angry and act out after her mother started to tell her to kill herself. Pt states she took 4 lorazepam knowing it would not kill her but to act in spite to mother. Pt is frustrated and guarded. After nurse leaves room pt throws remote in room onto floor, nurse returns to room immediately and finds remote on floor broken. All pieces and batteries removed.

## 2021-04-26 NOTE — ED Notes (Signed)
Security called for escort

## 2021-04-26 NOTE — ED Notes (Signed)
Pt has become increasingly agitated with being in [place]. States she is being treated like a prisoner and that this unit is worse than jail. Pt has multiple times went to sally port door and banged on door attempting to open. Pt is becoming verbally aggressive to this nurse and Legrand Como in unit. After educating pt on safety measures and reason for not being allowed to have a portable fan in room pt continues to cuss staff while returning to nurses desk. As soon as staff gets to desk, pt returns to day room, grabs chair and drags to room door. When nurse returns to area to assess situation, patient is about to step into chair and get to light in ceiling. Pt is directed to stop and told to return to room. Pt becomes angry with staff because she wants her cell phone to obtain a phone number for a friend [place] is [place] the beach. Pt educated on phone hours and process that will occur to obtain phone number once phone hours return. Pt escalates more due to not being able to make phone call. Pt reminded that she has had phone and opportunity to make phone calls despite after phone hours which she states is unfair. Pt continues to be verbally aggressive and is name calling due to situation. States that she would rather be in prison and that staff is treating her unfairly. Pt complains of lights in dayroom due to it interfering with her sleep, this nurse attempts to adjust patient door to aid in light entering room. Staff exits unit after returning chair to correct spot and pt can be heard degrading staff in room.
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
## 2021-04-26 NOTE — ED Notes (Signed)
IVC  MOVED  TO  [facility]

## 2021-04-26 NOTE — ED Notes (Addendum)
Patient transferred from ED to [facility] room 5 after screening for contraband. Report received from [facility], RN including Situation, Background, Assessment and Recommendations. Pt oriented to unit including Q15 minute rounds as well as the security cameras for their protection. Patient is alert and oriented, warm and dry in no acute distress. Patient denies SI, HI, and AVH. Pt. Encouraged to let this nurse know if needs arise.

## 2021-04-26 NOTE — ED Notes (Signed)
[name] Lose, NP states that due to patient taking multiple benzos before arrival, no further order for sleep aid will be made. patient night medications will be what she receives for sleep. Pt is educated of this and asked what she would like to drink with her medication tonight. Requests ice water

## 2021-04-27 DIAGNOSIS — F3130 Bipolar disorder, current episode depressed, mild or moderate severity, unspecified: Secondary | ICD-10-CM | POA: Diagnosis not present

## 2021-04-27 MED ORDER — ACETAMINOPHEN 500 MG PO TABS
1000.0000 mg | ORAL_TABLET | Freq: Once | ORAL | Status: AC
  Administered 2021-04-27: 1000 mg via ORAL
  Filled 2021-04-27: qty 2

## 2021-04-27 NOTE — ED Notes (Signed)
Pt requests to watch TV. Pt not allowed to have remote by this nurse due to breaking one during this shift. This nurse turns tv on and to news for patient.

## 2021-04-27 NOTE — Consult Note (Signed)
Follow up on consult/reassessment from last evening. Patient was seen face--to-face. Chart reviewed. Patient denies suicidal or homicidal ideations this morning. Denies auditory or visual hallucinations or paranoia. Patient states "I took the Ativan because  my mom told me to kill myself; I wanted to get her attention. It wasn't enough to kill  me." Patient states that her mother is the main trigger in her life. Patient was very upset with mother because mother would not let her borrow $200 to go to the beach and mother called patient "bitch, bully", and told her to kill herself. Patient states she sees Dr. Clovis Pu and no therapist at this time. She states Dr. Clovis Pu is working with her medication. Patient agrees that she needs to see a therapist and is open to this. Supportive counseling provided regarding boundaries with her mother.. Patient identifies her children and job as protective factors. She states that she likes her job and has very good reviews there. Patient does not meet criteria for inpatient hospitalization. She voices readiness for discharge and agrees to follow up with outpatient provider. TTS to give referrals for counseling. Patient will follow with Dr. Clovis Pu for medication management.

## 2021-04-27 NOTE — ED Provider Notes (Signed)
Dr. Weber Cooks.  Discharged in stable condition.   Lucrezia Starch, MD 04/27/21 1146

## 2021-04-27 NOTE — ED Notes (Signed)
Pt to restroom, pt out and tells this nurse that it is wrong that she is in same are as "crazy people" Pt asks for phone again  And is again educated on phone hours. Pt returns to bed and lays down to sleep.

## 2021-04-27 NOTE — ED Notes (Signed)
Pt given shower supplies. Demanding again that pt's numbers be retrieved from phone. Explained to pt that RNs are busy and two RNs needed for verification. Pt now does not want to shower. Shower door closed and informed that pt will have to knock to shower.

## 2021-04-27 NOTE — ED Notes (Signed)
Pt given an iced down coffee per pt request.

## 2021-04-27 NOTE — ED Notes (Signed)
Pt has now knocked for shower. Kina, RN on unit during shower.

## 2021-04-27 NOTE — ED Notes (Signed)
Pt given phone to talk to father. Psych NP at bedside.

## 2021-04-27 NOTE — ED Notes (Signed)
Attempted to get numbers from phone but pt's phone was dead. Leonia Reeves, RN as witness.

## 2021-04-27 NOTE — ED Notes (Signed)
Pt given belongings bags 1/1. Pt changed and walked out.

## 2021-04-27 NOTE — ED Notes (Signed)
Pt asleep at this time, unable to collect vitals. Will collect pt vitals once awake. 

## 2021-04-27 NOTE — ED Notes (Signed)
Pt has very labile emotions, she now tearful asking if she has to stay here or if she can be in another area. Pt then becomes agitated with this nurse because she does not have a book or a toothbrush. Pt provided with book to read and toothbrush and toothbrush. Pt brushes teeth and returns items back to nurse. Pt could be heard in restroom beating on sink due to being unhappy. Pt returns to room and and states this unit is a prison.

## 2021-04-27 NOTE — ED Notes (Signed)
Given meal tray.

## 2021-05-21 ENCOUNTER — Other Ambulatory Visit: Payer: Self-pay | Admitting: Psychiatry

## 2021-05-21 DIAGNOSIS — F411 Generalized anxiety disorder: Secondary | ICD-10-CM

## 2021-05-21 DIAGNOSIS — F4001 Agoraphobia with panic disorder: Secondary | ICD-10-CM

## 2021-05-21 DIAGNOSIS — F4310 Post-traumatic stress disorder, unspecified: Secondary | ICD-10-CM

## 2021-05-21 DIAGNOSIS — F40240 Claustrophobia: Secondary | ICD-10-CM

## 2021-05-28 ENCOUNTER — Other Ambulatory Visit: Payer: Self-pay | Admitting: Psychiatry

## 2021-05-28 DIAGNOSIS — F319 Bipolar disorder, unspecified: Secondary | ICD-10-CM

## 2021-05-30 NOTE — Telephone Encounter (Signed)
Appt tomorrow.

## 2021-05-31 ENCOUNTER — Other Ambulatory Visit: Payer: Self-pay

## 2021-05-31 ENCOUNTER — Encounter: Payer: Self-pay | Admitting: Psychiatry

## 2021-05-31 ENCOUNTER — Ambulatory Visit (INDEPENDENT_AMBULATORY_CARE_PROVIDER_SITE_OTHER): Payer: 59 | Admitting: Psychiatry

## 2021-05-31 DIAGNOSIS — F4001 Agoraphobia with panic disorder: Secondary | ICD-10-CM | POA: Diagnosis not present

## 2021-05-31 DIAGNOSIS — F4310 Post-traumatic stress disorder, unspecified: Secondary | ICD-10-CM | POA: Diagnosis not present

## 2021-05-31 DIAGNOSIS — R7989 Other specified abnormal findings of blood chemistry: Secondary | ICD-10-CM

## 2021-05-31 DIAGNOSIS — F40240 Claustrophobia: Secondary | ICD-10-CM

## 2021-05-31 DIAGNOSIS — F3162 Bipolar disorder, current episode mixed, moderate: Secondary | ICD-10-CM | POA: Diagnosis not present

## 2021-05-31 DIAGNOSIS — F411 Generalized anxiety disorder: Secondary | ICD-10-CM | POA: Diagnosis not present

## 2021-05-31 DIAGNOSIS — E538 Deficiency of other specified B group vitamins: Secondary | ICD-10-CM

## 2021-05-31 MED ORDER — ARIPIPRAZOLE 10 MG PO TABS: 10.0000 mg | ORAL_TABLET | Freq: Every day | ORAL | 0 refills | Status: DC

## 2021-05-31 NOTE — Progress Notes (Signed)
[name] [name] [id number] 1966/05/19 55 y.o.    Subjective:   Patient ID:  [name] [name] is a 55 y.o. (DOB 12/04/1965) female.  Chief Complaint:  Chief Complaint  Patient presents with   Follow-up   Bipolar 1 disorder    Depression        Associated symptoms include fatigue.  Associated symptoms include no decreased concentration, no headaches and no suicidal ideas.  Past medical history includes anxiety.   Anxiety Symptoms include palpitations. Patient reports no confusion, decreased concentration, dizziness, nervous/anxious behavior or suicidal ideas.    [name] [name] presents to the office today for follow-up of several dxes.  At visit  January 2020.  She had recently been diagnosed with low vitamin B12.  She was initiating treatment.  No meds were changed.  visit July 2020.  her anxiety was higher after reducing sertraline.  Therefore it was increased back to 100 mg daily.  06/2020 appt with the following noted: Cardiologist said she had chronic tx for afib and PVC's and it acts up more when she's under more stress.  Cardiologist suggested Bz when having the episodes.  Better at present but has periods of them daily.  They last an hour or more.  Rarely takes clonazepam, but it helps in 30 mins.  Tolerates clonazepam well otherwise. She felt increase sertraline helped some with anxiety.  It is better.  More irritable last couple of weeks than anxious unless heart episode.  Missed a couple of weeks of hormones which might have caused this. Panic attacks from 0-2 weekly is typical but episodes where she may have them daily for a couple of weeks associated with the palpitations as noted. Plan: DC clonazepam Start lorazepam 1 mg every 8 hours as needed anxiety attacks.  Call back if it is less effective than clonazepam or less well-tolerated.  04/06/20 appt with the following noted: Real stressed out and feels everything caving in on her.  Work, home and everything.  D broke her  leg and ex H had to stay with them. Supervisor stress and short-staffed.  Doesn't want the vaccine. Only one RX lorazepam.  It helped.  But afraid of addiction.  Tends to isolate weekends bc stress. Consistent with oxcarb 900 mg HS, sertraline 100, lamotrigine 200. Wanting to stay up late and then has EMA.   Not hyper, never.  Is irritable.   ExH's family gives her a hard time and drags D into it. Plan no med changes:  10/05/2020 appointment with the following noted: Some anger and anxiety but probably situational over the Covid situation and dealing with work situation.  Wakes that way a lot of times.  Irritable.  Walked out of work one day.  In the background all the time but will come out with triggers.  Controls it usually.  No panic. Plan: Increase Trileptal to 300 mg AM & 900 mg PM.   10/20/20 TC Rtc to patient and she reports feeling overwhelmed and stressed at work causing increased anxiety. She reports "going off" on her boss Friday. She reports there is someone at work going around telling everyone that patient is bipolar. [name]. She doesn't know how she would know. [name]. I asked if this was discussed at her last visit on 10/05/2020 and she said no. We discussed what she is thinking she needs, she really wants to see what Dr. Clovis Pu thinks  and get his opinion. She reports she thinks she needs 2 weeks off to get to feeling better and remove herself from work for right now. Then intermittent from that time. Informed her that is something he would probably do but I would discuss with him. OK'd  FMLA  11/23/20 TC: Message from staff: Addendum to previous messages. Next appt is 11/29/20 at 1:30. She has called back and said that she is having trouble at work with her co-workers. She wants to check into a hospital but she told me she didn't have any suicide thoughts. She is under a lot of stress at  work. Her best friend told someone at her employer that she has bipolar and co-workers are giving her a hard time because of the diagnosis. She said that she is still taking her meds. Her blood pressure when taken a couple of days ago was 184/101 and 148/88. She would like to be pointed in the right direction of what she needs to do? Her phone number is [phone]. She would like to speak to someone before her visit on Monday.   MD TC with pt:  Work stress with boss spreading info that she's bipolar.  Getting knit picked at work.  Did this to another friend who had bipolar disorder. Works [employer].  Talked to [place] yesterday.  Get a final warning writeup and feels it is unjust.  Working there since 2017 and all started in last 2 mos.    Was doing fine until this all started.  Feels discriminated against.  No SI.  Will see her Monday and start FMLA for a couple of weeks and we'll make med changes. She agrees to the plan.  Lynder Parents, MD, DFAPA  11/29/20 appt noted: Worked today.  Got new supervisor who's cleaning house.  She feels picked on.  This has made her mad and she has expressed anger at work and that's gotten her written up. I feel on edge all the time.  Also overwhelmed at home and problems with daughter as well.  Problems keeping things straight at home. More anger and irritability.   Patient denies difficulty with sleep initiation or maintenance. Denies appetite disturbance.  Patient reports that energy and motivation have been good.  She still has complaints of concentration and forgetfulness as previously noted.  Patient denies any suicidal ideation.  Still forgetful.   01/27/21 appt noted: Feels she's being mistreated at work bc she's bipolar.  Plans to get a new job.  Trying to get house ready to sale but D and son won't help with it.  Needed a leave from work.  Had panic attack last week thinking of going there.  Cried at work when there the last day. She is more down as well as agitated  and angry.  She has difficulty react really relaxing.  She easily panics.  She has difficulty concentrating.  She has feelings of hopelessness and helplessness.  No suicidal thoughts She has not tried the [employer] as suggested so far but agrees to do so. Plan: Start Vraylar 1.5 mg daily and if it works will wean other meds. Agree with medical leave until August 1   02/23/2021 appointment with the following noted: Disability approved until 02/28/21 but requested until August 1. Doesn't feel capable of RTW due to panic over thoughts of it.  Feels people are talking about her negatively at work.  Too anxious being around coworkers. Did not start Vraylar bc scared of antipsychotics. Sx continue as noted above.  HA and GI problems better not at work. Anxiety about work causes these sx and her BP to go up Plan: Start Vraylar 1.5 mg daily and if it works will wean other meds.   She agrees this time to take it.  Call if you change your mind or don't tolerate it. Agree with extending medical leave until August 8 due to depression, mood swings, anger outbursts, crying spells that interfere with ability to do her work and reduced concentration.   03/23/2021 phone call from patient:Patient lm to cancel appt on 8/5 stating if she was doing well per Dr Clovis Pu she didn't need it. She rescheduled for October next available. Patient will need a Rx for the Vraylar for samples were given at her last visit. 04/18/2021 phone call from patient:Pt left a message that the vraylar is to expensive and she will not be taking this medicine. Sent prescription for Abilify 10 mg in place of Vraylar  05/31/2021 appointment with the following noted: Insurance would not pay for [employer]. Abilify 10 mg worked pretty well for 5 and 1/2 weeks.  No SE now. Once accidentally took 20 mg and got dizzy with NV. Seen mood benefit with less depression and less irritable. Had argument with mother and threatened OD and IVC spent 12 hours in  hospital after OD of 5 mg lorazepam but wasn't trying to kill herself but was really mad at mother in a rage.  Doesn't get violent but mother triggers her. Boss now leaving her alone since her leave.  She's had no work performance problems.  She has talked to boss and it went well.  He's backed off micromanagment. No SE.  Eating less.  Happier.  Was told she had low b12, still working on getting it up.  Getting shots.  hysterectomy for endometriosis and normally takes a patch distributing hormones though she is recently been out and it worsened her mood when she was not taking it..  Worried about hormones affecting mood.  Hx bad PMS.  Prior psychiatric medication trials include  sertraline,  Depakote,  lithium, lamotrigine, Trileptal 900, Saphris, risperidone, Vraylar 1.5 worked but $, Abilify 10 Clonazepam.  Xanax SE, lorazepam 1.  Review of Systems:  Review of Systems  Constitutional:  Positive for fatigue. Negative for fever.  Cardiovascular:  Positive for palpitations.  Neurological:  Negative for dizziness, tremors, weakness and headaches.  Psychiatric/Behavioral:  Negative for agitation, behavioral problems, confusion, decreased concentration, dysphoric mood, hallucinations, self-injury, sleep disturbance and suicidal ideas. The patient is not nervous/anxious and is not hyperactive.    Medications: I have reviewed the patient's current medications.  Current Outpatient Medications  Medication Sig Dispense Refill   ARIPiprazole (ABILIFY) 10 MG tablet 1/2 tablet daily for 1 week then 1 daily 30 tablet 1   cyclobenzaprine (FLEXERIL) 5 MG tablet Take 1 tablet (5 mg total) by mouth 3 (three) times daily as needed. 15 tablet 0   ibuprofen (ADVIL) 200 MG tablet Takes 1-3 tablets as needed     lamoTRIgine (LAMICTAL) 200 MG tablet TAKE 1 TABLET BY MOUTH  EVERY NIGHT AT BEDTIME 90 tablet 3   LORazepam (ATIVAN) 1 MG tablet Take 1 tablet (1 mg total) by mouth every 8 (eight) hours. 30 tablet 3    Multiple Vitamin (MULTIVITAMIN WITH MINERALS) TABS tablet Take 1 tablet by mouth at bedtime.     omeprazole (PRILOSEC) 40 MG capsule Take 1 capsule (40 mg total) by mouth at bedtime. 30 capsule 6   Oxcarbazepine (TRILEPTAL) 300 MG  tablet TAKE 1 TABLET BY MOUTH EVERY MORNING AND 3 TABLETS AT NIGHT 360 tablet 0   sertraline (ZOLOFT) 100 MG tablet TAKE 1 TABLET BY MOUTH  DAILY 30 tablet 0   dicyclomine (BENTYL) 20 MG tablet Take 1 tablet (20 mg total) by mouth 3 (three) times daily before meals. (Patient not taking: Reported on 05/31/2021) 90 tablet 3   pantoprazole (PROTONIX) 40 MG tablet Take 1 tablet by mouth daily. (Patient not taking: Reported on 05/31/2021)     No current facility-administered medications for this visit.    Medication Side Effects: None  Allergies:  Allergies  Allergen Reactions   Phenylmercuric Nitrate Rash   Augmentin [Amoxicillin-Pot Clavulanate] Nausea Only    Has patient had a PCN reaction causing immediate rash, facial/tongue/throat swelling, SOB or lightheadedness with hypotension: No Has patient had a PCN reaction causing severe rash involving mucus membranes or skin necrosis: No Has patient had a PCN reaction that required hospitalization: No Has patient had a PCN reaction occurring within the last 10 years: Yes If all of the above answers are "NO", then may proceed with Cephalosporin use.    Codeine Nausea Only   Diclofenac Nausea Only   Latex Rash   Neomycin Rash   Percocet [Oxycodone-Acetaminophen] Rash    Hives     Past Medical History:  Diagnosis Date   Anxiety    Arthritis    B12 deficiency    Chicken pox    Depression    Fibroid    GERD (gastroesophageal reflux disease)    History of hiatal hernia    Hypertension    Iron deficiency anemia    Menorrhagia    Migraines    PAF (paroxysmal atrial fibrillation) ([place])    a. 04/2013 Echo: EF 55-60%, impaired relaxation. Mild MR/TR. Nl RV fxn; b. CHA2DS2VASc = 2; c. PRN flecainide.   Snores      Family History  Problem Relation Age of Onset   Diabetes Mother    Alcohol abuse Father    Arthritis Father    Heart disease Father    Arthritis Sister    Alcohol abuse Maternal Aunt    Alcohol abuse Maternal Uncle    Alcohol abuse Paternal Grandfather    Lung cancer Paternal Grandfather        smoker    Arthritis Maternal Grandmother    Schizophrenia Maternal Grandmother    Leukemia Maternal Grandfather    Breast cancer Neg Hx     Social History   Socioeconomic History   Marital status: Legally Separated    Spouse name: Not on file   Number of children: Not on file   Years of education: Not on file   Highest education level: Not on file  Occupational History   Not on file  Tobacco Use   Smoking status: Former    Packs/day: 1.00    Years: 10.00    Pack years: 10.00    Types: Cigarettes    Quit date: 08/22/1991    Years since quitting: 29.7   Smokeless tobacco: Never  Vaping Use   Vaping Use: Never used  Substance and Sexual Activity   Alcohol use: Not Currently    Alcohol/week: 0.0 standard drinks    Comment: twice a year    Drug use: Not Currently    Types: Marijuana    Comment: not for a couple years   Sexual activity: Yes    Partners: Male    Birth control/protection: Surgical    Comment: Husband  Other Topics Concern   Not on file  Social History Narrative   [employer]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    Lives with husband and 2 children    Son- 34 yo and daughter 62 yo    Pets: 2 dogs    Caffeine- 2 sodas 20 oz, coffee 1 cup occasionally    Enjoys being outside with family       DPR mom Horton Chin [phone]    Social Determinants of Health   Financial Resource Strain: Not on file  Food Insecurity: Not on file  Transportation Needs: Not on file  Physical Activity: Not on file  Stress: Not on file  Social Connections: Not on file  Intimate Partner Violence: Not on file    Past Medical History, Surgical history, Social history, and  Family history were reviewed and updated as appropriate.   Please see review of systems for further details on the patient's review from today.   Objective:   Physical Exam:  LMP  (LMP Unknown)   Physical Exam Constitutional:      General: She is not in acute distress. Musculoskeletal:        General: No deformity.  Neurological:     Mental Status: She is alert and oriented to person, place, and time.     Cranial Nerves: No dysarthria.     Coordination: Coordination normal.  Psychiatric:        Attention and Perception: Attention and perception normal. She does not perceive auditory or visual hallucinations.        Mood and Affect: Mood is anxious. Mood is not depressed. Affect is not labile, blunt, angry or inappropriate.        Speech: Speech normal.        Behavior: Behavior normal. Behavior is cooperative.        Thought Content: Thought content is not paranoid or delusional. Thought content does not include homicidal or suicidal ideation. Thought content does not include homicidal or suicidal plan.        Cognition and Memory: Cognition and memory normal.        Judgment: Judgment normal.     Comments: Insight intact Her psychiatric symptoms are better now    Lab Review:     Component Value Date/Time   NA 139 04/26/2021 1615   NA 140 05/24/2020 1701   NA 138 11/25/2014 2152   K 3.8 04/26/2021 1615   K 3.7 11/25/2014 2152   CL 106 04/26/2021 1615   CL 105 11/25/2014 2152   CO2 25 04/26/2021 1615   CO2 28 11/25/2014 2152   GLUCOSE 96 04/26/2021 1615   GLUCOSE 98 11/25/2014 2152   BUN 11 04/26/2021 1615   BUN 11 05/24/2020 1701   BUN 10 11/25/2014 2152   CREATININE 0.84 04/26/2021 1615   CREATININE 0.81 11/25/2014 2152   CALCIUM 10.1 04/26/2021 1615   CALCIUM 10.0 11/25/2014 2152   PROT 7.4 04/26/2021 1615   PROT 6.9 05/24/2020 1701   PROT 7.2 11/25/2014 2152   ALBUMIN 4.2 04/26/2021 1615   ALBUMIN 4.6 05/24/2020 1701   ALBUMIN 4.1 11/25/2014 2152   AST 19  04/26/2021 1615   AST 15 11/25/2014 2152   ALT 19 04/26/2021 1615   ALT 13 (L) 11/25/2014 2152   ALKPHOS 88 04/26/2021 1615   ALKPHOS 61 11/25/2014 2152   BILITOT 0.6 04/26/2021 1615   BILITOT 0.2 05/24/2020 1701   BILITOT 0.4 11/25/2014 2152   GFRNONAA >60 04/26/2021 1615  GFRNONAA >60 11/25/2014 2152   GFRAA 90 05/24/2020 1701   GFRAA >60 11/25/2014 2152       Component Value Date/Time   WBC 4.5 04/26/2021 1615   RBC 4.70 04/26/2021 1615   HGB 14.0 04/26/2021 1615   HGB 13.9 06/14/2020 1625   HCT 40.4 04/26/2021 1615   HCT 41.7 06/14/2020 1625   PLT 298 04/26/2021 1615   PLT 367 06/14/2020 1625   MCV 86.0 04/26/2021 1615   MCV 86 06/14/2020 1625   MCV 77 (L) 11/25/2014 2152   MCH 29.8 04/26/2021 1615   MCHC 34.7 04/26/2021 1615   RDW 13.5 04/26/2021 1615   RDW 13.2 06/14/2020 1625   RDW 22.4 (H) 11/25/2014 2152   LYMPHSABS 2.3 05/24/2020 1701   LYMPHSABS 2.4 03/27/2013 0500   MONOABS 0.4 07/30/2018 1033   MONOABS 0.4 03/27/2013 0500   EOSABS 0.1 05/24/2020 1701   EOSABS 0.2 03/27/2013 0500   BASOSABS 0.0 05/24/2020 1701   BASOSABS 0.0 03/27/2013 0500    No results found for: POCLITH, LITHIUM   No results found for: PHENYTOIN, PHENOBARB, VALPROATE, CBMZ   .res Assessment: Plan:    [name] [name] was seen today for follow-up and bipolar 1 disorder.  Diagnoses and all orders for this visit:  Bipolar 1 disorder, mixed, moderate (HCC)  PTSD (post-traumatic stress disorder)  Generalized anxiety disorder  Panic disorder with agoraphobia  Claustrophobia  B12 deficiency  Low vitamin D level  Greater than 50% of 30 minutes of non-face to face time with patient was spent on counseling and coordination of care. We discussed Francheska has a history of bipolar disorder with heavy seasonal component .  l she has addressed the work stress and it is much better at this time.  Vraylar helped her mood but she could not afford it.  We switched to Abilify 10 mg which she has  been on for about 5 weeks and so far mood stability and depression are better.  She is not having any side effects.  We We discussed in depth the dosing range of Abilify and typically 15 mg is the usual mood stabilizing dose and she is below that at 10 mg.  However she had side effects at 20 mg which she took accidentally.  She did have a brief involuntary commitment of less than 2 or 24 hours after overdosing on 5 lorazepam.  There was no suicidal intent and she has had no further suicidal ideation.  At that time she had not been on the full dose of Abilify.  This is of real concern because she does have underlying cardiology problems.  Cardiologist has suggested as needed benzo to help reduce her cardiac symptoms.     She previously had exacerbation of anxiety when she attempted to wean off sertraline.  She has tried all non-antipsychotic mood stabilizers except CBZ which is similar to oxcarbazapine that has been inadequate. Disc jer fears about antipsychotics in detail and she agrees. Discussed potential metabolic side effects associated with atypical antipsychotics, as well as potential risk for movement side effects. Advised pt to contact office if movement side effects occur.   Continue Abilify 10 mg daily.  Option increase if needed call..  Low B12 and vitamin D can contribute to psych problems and can be consequence of AEDs.  We discussed the short-term risks associated with benzodiazepines including sedation and increased fall risk among others.  Discussed long-term side effect risk including dependence, potential withdrawal symptoms, and the potential eventual dose-related risk of dementia.  Disc risk dependence in detail in terms of the amount that can be used without it.  Discussed safety plan at length with patient.  Advised patient to contact office with any worsening signs and symptoms.  Instructed patient to go to the [facility] emergency room for evaluation if experiencing any  acute safety concerns, to include suicidal intent.  This appointment was 30 minutes  FU 8 weeks  Lynder Parents, MD, DFAPA    Please see After Visit Summary for patient specific instructions.  No future appointments.   No orders of the defined types were placed in this encounter.     -------------------------------

## 2021-07-07 ENCOUNTER — Other Ambulatory Visit: Payer: Self-pay | Admitting: Psychiatry

## 2021-07-07 DIAGNOSIS — F411 Generalized anxiety disorder: Secondary | ICD-10-CM

## 2021-07-07 DIAGNOSIS — F4001 Agoraphobia with panic disorder: Secondary | ICD-10-CM

## 2021-07-07 DIAGNOSIS — F4310 Post-traumatic stress disorder, unspecified: Secondary | ICD-10-CM

## 2021-07-07 DIAGNOSIS — F40240 Claustrophobia: Secondary | ICD-10-CM

## 2021-07-19 ENCOUNTER — Telehealth: Payer: Self-pay | Admitting: Psychiatry

## 2021-07-19 NOTE — Telephone Encounter (Signed)
Pt stated she has been on Abilify for about 8 weeks and see's no benefit.Her knees feel like she wants to get up and run,but they are weak and she is shaky.This started about 2 weeks ago.She has been having headaches as well

## 2021-07-19 NOTE — Telephone Encounter (Signed)
Reduce the Abilify to 1/2 tablet daily.  I see her in 3 weeks and will discuss what to do from there.

## 2021-07-19 NOTE — Telephone Encounter (Signed)
Pt called reporting the Abilify is making her legs restless and shaky. Feels like noodles and afraid she will fall. She wants to stop taking. Advise how to wean off.  Call Pt @ [phone]. Apt 12/22

## 2021-07-20 NOTE — Telephone Encounter (Signed)
OK noted.

## 2021-07-20 NOTE — Telephone Encounter (Signed)
Pt stated she has been taking 1/2 tab daily.She stated her leg was hurting last night and she decided to take a whole tab and it helped her pain go away.Her family also told her she has changed since being on Abilify so she will continue it.

## 2021-08-04 ENCOUNTER — Other Ambulatory Visit: Payer: Self-pay | Admitting: Psychiatry

## 2021-08-04 DIAGNOSIS — F4001 Agoraphobia with panic disorder: Secondary | ICD-10-CM

## 2021-08-04 DIAGNOSIS — F411 Generalized anxiety disorder: Secondary | ICD-10-CM

## 2021-08-04 DIAGNOSIS — F40240 Claustrophobia: Secondary | ICD-10-CM

## 2021-08-04 DIAGNOSIS — F4310 Post-traumatic stress disorder, unspecified: Secondary | ICD-10-CM

## 2021-08-11 ENCOUNTER — Other Ambulatory Visit: Payer: Self-pay

## 2021-08-11 ENCOUNTER — Ambulatory Visit: Payer: 59 | Admitting: Psychiatry

## 2021-08-11 ENCOUNTER — Encounter: Payer: Self-pay | Admitting: Psychiatry

## 2021-08-11 DIAGNOSIS — F4001 Agoraphobia with panic disorder: Secondary | ICD-10-CM

## 2021-08-11 DIAGNOSIS — F411 Generalized anxiety disorder: Secondary | ICD-10-CM | POA: Diagnosis not present

## 2021-08-11 DIAGNOSIS — F4310 Post-traumatic stress disorder, unspecified: Secondary | ICD-10-CM | POA: Diagnosis not present

## 2021-08-11 DIAGNOSIS — F3162 Bipolar disorder, current episode mixed, moderate: Secondary | ICD-10-CM

## 2021-08-11 DIAGNOSIS — F319 Bipolar disorder, unspecified: Secondary | ICD-10-CM

## 2021-08-11 DIAGNOSIS — R5382 Chronic fatigue, unspecified: Secondary | ICD-10-CM

## 2021-08-11 DIAGNOSIS — F40240 Claustrophobia: Secondary | ICD-10-CM

## 2021-08-11 MED ORDER — OXCARBAZEPINE 300 MG PO TABS: 900.0000 mg | ORAL_TABLET | Freq: Every day | ORAL | 0 refills | Status: DC

## 2021-08-11 MED ORDER — SERTRALINE HCL 50 MG PO TABS: 75.0000 mg | ORAL_TABLET | Freq: Every day | ORAL | 0 refills | Status: DC

## 2021-08-11 MED ORDER — LAMOTRIGINE 200 MG PO TABS: 200.0000 mg | ORAL_TABLET | Freq: Every day | ORAL | 1 refills | Status: DC

## 2021-08-11 NOTE — Progress Notes (Signed)
[name] [name] [id number] 02/20/1966 55 y.o.    Subjective:   Patient ID:  [name] [name] is a 55 y.o. (DOB Apr 14, 1966) female.  Chief Complaint:  Chief Complaint  Patient presents with   Follow-up    Bipolar 1 disorder, mixed, moderate (HCC)   Depression   Anxiety    Depression        Associated symptoms include fatigue.  Associated symptoms include no decreased concentration, no headaches and no suicidal ideas.  Past medical history includes anxiety.   Anxiety Patient reports no confusion, decreased concentration, dizziness, nervous/anxious behavior, palpitations or suicidal ideas.    [name] [name] presents to the office today for follow-up of several dxes.  At visit  January 2020.  She had recently been diagnosed with low vitamin B12.  She was initiating treatment.  No meds were changed.  visit July 2020.  her anxiety was higher after reducing sertraline.  Therefore it was increased back to 100 mg daily.  06/2020 appt with the following noted: Cardiologist said she had chronic tx for afib and PVC's and it acts up more when she's under more stress.  Cardiologist suggested Bz when having the episodes.  Better at present but has periods of them daily.  They last an hour or more.  Rarely takes clonazepam, but it helps in 30 mins.  Tolerates clonazepam well otherwise. She felt increase sertraline helped some with anxiety.  It is better.  More irritable last couple of weeks than anxious unless heart episode.  Missed a couple of weeks of hormones which might have caused this. Panic attacks from 0-2 weekly is typical but episodes where she may have them daily for a couple of weeks associated with the palpitations as noted. Plan: DC clonazepam Start lorazepam 1 mg every 8 hours as needed anxiety attacks.  Call back if it is less effective than clonazepam or less well-tolerated.  04/06/20 appt with the following noted: Real stressed out and feels everything caving in on her.  Work,  home and everything.  D broke her leg and ex H had to stay with them. Supervisor stress and short-staffed.  Doesn't want the vaccine. Only one RX lorazepam.  It helped.  But afraid of addiction.  Tends to isolate weekends bc stress. Consistent with oxcarb 900 mg HS, sertraline 100, lamotrigine 200. Wanting to stay up late and then has EMA.   Not hyper, never.  Is irritable.   ExH's family gives her a hard time and drags D into it. Plan no med changes:  10/05/2020 appointment with the following noted: Some anger and anxiety but probably situational over the Covid situation and dealing with work situation.  Wakes that way a lot of times.  Irritable.  Walked out of work one day.  In the background all the time but will come out with triggers.  Controls it usually.  No panic. Plan: Increase Trileptal to 300 mg AM & 900 mg PM.   10/20/20 TC Rtc to patient and she reports feeling overwhelmed and stressed at work causing increased anxiety. She reports "going off" on her boss Friday. She reports there is someone at work going around telling everyone that patient is bipolar. [name]. She doesn't know how she would know. [name]. I asked if this was discussed at her last visit on 10/05/2020 and she said no. We discussed what she is thinking she needs, she  really wants to see what Dr. Clovis Pu thinks and get his opinion. She reports she thinks she needs 2 weeks off to get to feeling better and remove herself from work for right now. Then intermittent from that time. Informed her that is something he would probably do but I would discuss with him. OK'd  FMLA  11/23/20 TC: Message from staff: Addendum to previous messages. Next appt is 11/29/20 at 1:30. She has called back and said that she is having trouble at work with her co-workers. She wants to check into a hospital but she told me she didn't have any suicide thoughts.  She is under a lot of stress at work. Her best friend told someone at her employer that she has bipolar and co-workers are giving her a hard time because of the diagnosis. She said that she is still taking her meds. Her blood pressure when taken a couple of days ago was 184/101 and 148/88. She would like to be pointed in the right direction of what she needs to do? Her phone number is [phone]. She would like to speak to someone before her visit on Monday.   MD TC with pt:  Work stress with boss spreading info that she's bipolar.  Getting knit picked at work.  Did this to another friend who had bipolar disorder. Works [employer].  Talked to [place] yesterday.  Get a final warning writeup and feels it is unjust.  Working there since 2017 and all started in last 2 mos.    Was doing fine until this all started.  Feels discriminated against.  No SI.  Will see her Monday and start FMLA for a couple of weeks and we'll make med changes. She agrees to the plan.  [name] Parents, MD, DFAPA  11/29/20 appt noted: Worked today.  Got new supervisor who's cleaning house.  She feels picked on.  This has made her mad and she has expressed anger at work and that's gotten her written up. I feel on edge all the time.  Also overwhelmed at home and problems with daughter as well.  Problems keeping things straight at home. More anger and irritability.   Patient denies difficulty with sleep initiation or maintenance. Denies appetite disturbance.  Patient reports that energy and motivation have been good.  She still has complaints of concentration and forgetfulness as previously noted.  Patient denies any suicidal ideation.  Still forgetful.   01/27/21 appt noted: Feels she's being mistreated at work bc she's bipolar.  Plans to get a new job.  Trying to get house ready to sale but D and son won't help with it.  Needed a leave from work.  Had panic attack last week thinking of going there.  Cried at work when there the last day. She is  more down as well as agitated and angry.  She has difficulty react really relaxing.  She easily panics.  She has difficulty concentrating.  She has feelings of hopelessness and helplessness.  No suicidal thoughts She has not tried the [employer] as suggested so far but agrees to do so. Plan: Start Vraylar 1.5 mg daily and if it works will wean other meds. Agree with medical leave until August 1   02/23/2021 appointment with the following noted: Disability approved until 02/28/21 but requested until August 1. Doesn't feel capable of RTW due to panic over thoughts of it.  Feels people are talking about her negatively at work.  Too anxious being around coworkers. Did not start Vraylar bc  scared of antipsychotics. Sx continue as noted above. HA and GI problems better not at work. Anxiety about work causes these sx and her BP to go up Plan: Start Vraylar 1.5 mg daily and if it works will wean other meds.   She agrees this time to take it.  Call if you change your mind or don't tolerate it. Agree with extending medical leave until August 8 due to depression, mood swings, anger outbursts, crying spells that interfere with ability to do her work and reduced concentration.   03/23/2021 phone call from patient:Patient lm to cancel appt on 8/5 stating if she was doing well per Dr Clovis Pu she didn't need it. She rescheduled for October next available. Patient will need a Rx for the Vraylar for samples were given at her last visit. 04/18/2021 phone call from patient:Pt left a message that the vraylar is to expensive and she will not be taking this medicine. Sent prescription for Abilify 10 mg in place of Vraylar  05/31/2021 appointment with the following noted: Insurance would not pay for [employer]. Abilify 10 mg worked pretty well for 5 and 1/2 weeks.  No SE now. Once accidentally took 20 mg and got dizzy with NV. Seen mood benefit with less depression and less irritable. Had argument with mother and threatened OD  and IVC spent 12 hours in hospital after OD of 5 mg lorazepam but wasn't trying to kill herself but was really mad at mother in a rage.  Doesn't get violent but mother triggers her. Boss now leaving her alone since her leave.  She's had no work performance problems.  She has talked to boss and it went well.  He's backed off micromanagment. No SE.  Eating less.  Happier. Plan: Continue Abilify 10 mg daily.  Option increase if needed call..  07/19/2021 phone call: Patient complaining that she felt shaky and restless on Abilify 10 mg for several weeks.  She was offered the option to reduce the dose.  08/11/2021 appointment with the following noted: For awhile had restless legs around knees but gone now. Family says she's doing better on it, not as irritable.  Not as on edge.   Still on Abilify 10 mg daily, lamotrigine 200, oxcarbazepine 900 mg HS, sertraline 100 mg daily. Not really depressed generally.  Sleep is ok but awakens early to go to bathroom.  At least 7 hours. Good sleeper.   No bouts SI since here.   Good work function. SE none unless weight gain.  Was told she had low b12, still working on getting it up.  Getting shots.  hysterectomy for endometriosis and normally takes a patch distributing hormones though she is recently been out and it worsened her mood when she was not taking it..  Worried about hormones affecting mood.  Hx bad PMS.  Prior psychiatric medication trials include  sertraline,  Depakote,  lithium, lamotrigine, Trileptal 900, Saphris, risperidone, Vraylar 1.5 worked but $, Abilify 10 Clonazepam.  Xanax SE, lorazepam 1.  Review of Systems:  Review of Systems  Constitutional:  Positive for fatigue. Negative for fever.  Cardiovascular:  Negative for palpitations.  Neurological:  Negative for dizziness, tremors, weakness and headaches.  Psychiatric/Behavioral:  Negative for agitation, behavioral problems, confusion, decreased concentration, dysphoric mood,  hallucinations, self-injury, sleep disturbance and suicidal ideas. The patient is not nervous/anxious and is not hyperactive.    Medications: I have reviewed the patient's current medications.  Current Outpatient Medications  Medication Sig Dispense Refill   ARIPiprazole (ABILIFY) 10  MG tablet Take 1 tablet (10 mg total) by mouth daily. 90 tablet 0   ibuprofen (ADVIL) 200 MG tablet Takes 1-3 tablets as needed     LORazepam (ATIVAN) 1 MG tablet Take 1 tablet (1 mg total) by mouth every 8 (eight) hours. 30 tablet 3   Multiple Vitamin (MULTIVITAMIN WITH MINERALS) TABS tablet Take 1 tablet by mouth at bedtime.     omeprazole (PRILOSEC) 40 MG capsule Take 1 capsule (40 mg total) by mouth at bedtime. 30 capsule 6   cyclobenzaprine (FLEXERIL) 5 MG tablet Take 1 tablet (5 mg total) by mouth 3 (three) times daily as needed. (Patient not taking: Reported on 08/11/2021) 15 tablet 0   lamoTRIgine (LAMICTAL) 200 MG tablet Take 1 tablet (200 mg total) by mouth at bedtime. 90 tablet 1   Oxcarbazepine (TRILEPTAL) 300 MG tablet Take 3 tablets (900 mg total) by mouth daily. 270 tablet 0   sertraline (ZOLOFT) 50 MG tablet Take 1.5 tablets (75 mg total) by mouth daily. 135 tablet 0   No current facility-administered medications for this visit.    Medication Side Effects: None  Allergies:  Allergies  Allergen Reactions   Phenylmercuric Nitrate Rash   Augmentin [Amoxicillin-Pot Clavulanate] Nausea Only    Has patient had a PCN reaction causing immediate rash, facial/tongue/throat swelling, SOB or lightheadedness with hypotension: No Has patient had a PCN reaction causing severe rash involving mucus membranes or skin necrosis: No Has patient had a PCN reaction that required hospitalization: No Has patient had a PCN reaction occurring within the last 10 years: Yes If all of the above answers are "NO", then may proceed with Cephalosporin use.    Codeine Nausea Only   Diclofenac Nausea Only   Latex Rash    Neomycin Rash   Percocet [Oxycodone-Acetaminophen] Rash    Hives     Past Medical History:  Diagnosis Date   Anxiety    Arthritis    B12 deficiency    Chicken pox    Depression    Fibroid    GERD (gastroesophageal reflux disease)    History of hiatal hernia    Hypertension    Iron deficiency anemia    Menorrhagia    Migraines    PAF (paroxysmal atrial fibrillation) ([place])    a. 04/2013 Echo: EF 55-60%, impaired relaxation. Mild MR/TR. Nl RV fxn; b. CHA2DS2VASc = 2; c. PRN flecainide.   Snores     Family History  Problem Relation Age of Onset   Diabetes Mother    Alcohol abuse Father    Arthritis Father    Heart disease Father    Arthritis Sister    Alcohol abuse Maternal Aunt    Alcohol abuse Maternal Uncle    Alcohol abuse Paternal Grandfather    Lung cancer Paternal Grandfather        smoker    Arthritis Maternal Grandmother    Schizophrenia Maternal Grandmother    Leukemia Maternal Grandfather    Breast cancer Neg Hx     Social History   Socioeconomic History   Marital status: Legally Separated    Spouse name: Not on file   Number of children: Not on file   Years of education: Not on file   Highest education level: Not on file  Occupational History   Not on file  Tobacco Use   Smoking status: Former    Packs/day: 1.00    Years: 10.00    Pack years: 10.00    Types: Cigarettes  Quit date: 08/22/1991    Years since quitting: 29.9   Smokeless tobacco: Never  Vaping Use   Vaping Use: Never used  Substance and Sexual Activity   Alcohol use: Not Currently    Alcohol/week: 0.0 standard drinks    Comment: twice a year    Drug use: Not Currently    Types: Marijuana    Comment: not for a couple years   Sexual activity: Yes    Partners: Male    Birth control/protection: Surgical    Comment: Husband  Other Topics Concern   Not on file  Social History Narrative   [employer]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    Lives with husband and 2 children    Son- 35  yo and daughter 49 yo    Pets: 2 dogs    Caffeine- 2 sodas 20 oz, coffee 1 cup occasionally    Enjoys being outside with family       DPR mom Horton Chin [phone]    Social Determinants of Health   Financial Resource Strain: Not on file  Food Insecurity: Not on file  Transportation Needs: Not on file  Physical Activity: Not on file  Stress: Not on file  Social Connections: Not on file  Intimate Partner Violence: Not on file    Past Medical History, Surgical history, Social history, and Family history were reviewed and updated as appropriate.   Please see review of systems for further details on the patient's review from today.   Objective:   Physical Exam:  LMP  (LMP Unknown)   Physical Exam Constitutional:      General: She is not in acute distress. Musculoskeletal:        General: No deformity.  Neurological:     Mental Status: She is alert and oriented to person, place, and time.     Cranial Nerves: No dysarthria.     Coordination: Coordination normal.  Psychiatric:        Attention and Perception: Attention and perception normal. She does not perceive auditory or visual hallucinations.        Mood and Affect: Mood is not anxious or depressed. Affect is not labile, blunt, angry or inappropriate.        Speech: Speech normal.        Behavior: Behavior normal. Behavior is cooperative.        Thought Content: Thought content is not paranoid or delusional. Thought content does not include homicidal or suicidal ideation. Thought content does not include suicidal plan.        Cognition and Memory: Cognition and memory normal.        Judgment: Judgment normal.     Comments: Insight intact Her psychiatric symptoms are better now Affect positive Not irritable.    Lab Review:     Component Value Date/Time   NA 139 04/26/2021 1615   NA 140 05/24/2020 1701   NA 138 11/25/2014 2152   K 3.8 04/26/2021 1615   K 3.7 11/25/2014 2152   CL 106 04/26/2021 1615   CL  105 11/25/2014 2152   CO2 25 04/26/2021 1615   CO2 28 11/25/2014 2152   GLUCOSE 96 04/26/2021 1615   GLUCOSE 98 11/25/2014 2152   BUN 11 04/26/2021 1615   BUN 11 05/24/2020 1701   BUN 10 11/25/2014 2152   CREATININE 0.84 04/26/2021 1615   CREATININE 0.81 11/25/2014 2152   CALCIUM 10.1 04/26/2021 1615   CALCIUM 10.0 11/25/2014 2152   PROT 7.4  04/26/2021 1615   PROT 6.9 05/24/2020 1701   PROT 7.2 11/25/2014 2152   ALBUMIN 4.2 04/26/2021 1615   ALBUMIN 4.6 05/24/2020 1701   ALBUMIN 4.1 11/25/2014 2152   AST 19 04/26/2021 1615   AST 15 11/25/2014 2152   ALT 19 04/26/2021 1615   ALT 13 (L) 11/25/2014 2152   ALKPHOS 88 04/26/2021 1615   ALKPHOS 61 11/25/2014 2152   BILITOT 0.6 04/26/2021 1615   BILITOT 0.2 05/24/2020 1701   BILITOT 0.4 11/25/2014 2152   GFRNONAA >60 04/26/2021 1615   GFRNONAA >60 11/25/2014 2152   GFRAA 90 05/24/2020 1701   GFRAA >60 11/25/2014 2152       Component Value Date/Time   WBC 4.5 04/26/2021 1615   RBC 4.70 04/26/2021 1615   HGB 14.0 04/26/2021 1615   HGB 13.9 06/14/2020 1625   HCT 40.4 04/26/2021 1615   HCT 41.7 06/14/2020 1625   PLT 298 04/26/2021 1615   PLT 367 06/14/2020 1625   MCV 86.0 04/26/2021 1615   MCV 86 06/14/2020 1625   MCV 77 (L) 11/25/2014 2152   MCH 29.8 04/26/2021 1615   MCHC 34.7 04/26/2021 1615   RDW 13.5 04/26/2021 1615   RDW 13.2 06/14/2020 1625   RDW 22.4 (H) 11/25/2014 2152   LYMPHSABS 2.3 05/24/2020 1701   LYMPHSABS 2.4 03/27/2013 0500   MONOABS 0.4 07/30/2018 1033   MONOABS 0.4 03/27/2013 0500   EOSABS 0.1 05/24/2020 1701   EOSABS 0.2 03/27/2013 0500   BASOSABS 0.0 05/24/2020 1701   BASOSABS 0.0 03/27/2013 0500    No results found for: POCLITH, LITHIUM   No results found for: PHENYTOIN, PHENOBARB, VALPROATE, CBMZ   .res Assessment: Plan:    Tamakia was seen today for follow-up, depression and anxiety.  Diagnoses and all orders for this visit:  Bipolar 1 disorder, mixed, moderate (HCC)  PTSD  (post-traumatic stress disorder) -     sertraline (ZOLOFT) 50 MG tablet; Take 1.5 tablets (75 mg total) by mouth daily.  Generalized anxiety disorder -     sertraline (ZOLOFT) 50 MG tablet; Take 1.5 tablets (75 mg total) by mouth daily.  Panic disorder with agoraphobia -     sertraline (ZOLOFT) 50 MG tablet; Take 1.5 tablets (75 mg total) by mouth daily.  Chronic fatigue  Claustrophobia -     sertraline (ZOLOFT) 50 MG tablet; Take 1.5 tablets (75 mg total) by mouth daily.  Bipolar I disorder with seasonal pattern (HCC) -     lamoTRIgine (LAMICTAL) 200 MG tablet; Take 1 tablet (200 mg total) by mouth at bedtime. -     Oxcarbazepine (TRILEPTAL) 300 MG tablet; Take 3 tablets (900 mg total) by mouth daily.   Greater than 50% of 30 minutes of non-face to face time with patient was spent on counseling and coordination of care. We discussed Sherrel has a history of bipolar disorder with heavy seasonal component .  l she has addressed the work stress and it is much better at this time.  Vraylar helped her mood but she could not afford it.  We switched to Abilify 10 mg which she has been on for about several weeks and so far mood stability and depression are better.  She is not having any side effects.  We We discussed in depth the dosing range of Abilify and typically 15 mg is the usual mood stabilizing dose and she is below that at 10 mg.  However she had side effects at 20 mg which she took accidentally.  She previously had exacerbation of anxiety when she attempted to wean off sertraline but will try again going slower to maybe help weight loss.  She has tried all non-antipsychotic mood stabilizers except CBZ which is similar to oxcarbazapine that has been inadequate. Disc jer fears about antipsychotics in detail and she agrees. Discussed potential metabolic side effects associated with atypical antipsychotics, as well as potential risk for movement side effects. Advised pt to contact office if  movement side effects occur.   Continue Abilify 10 mg daily.  Option increase if needed call.. Continue Abilify 10 mg daily, lamotrigine 200, oxcarbazepine 900 mg HS,  Reduce sertraline 75 mg daily to see if can lose weight.  Low B12 and vitamin D can contribute to psych problems and can be consequence of AEDs.  We discussed the short-term risks associated with benzodiazepines including sedation and increased fall risk among others.  Discussed long-term side effect risk including dependence, potential withdrawal symptoms, and the potential eventual dose-related risk of dementia.  Disc risk dependence in detail in terms of the amount that can be used without it. Ativan less sedating than Xanax and helps. Prn panic  Discussed safety plan at length with patient.  Advised patient to contact office with any worsening signs and symptoms.  Instructed patient to go to the [facility] emergency room for evaluation if experiencing any acute safety concerns, to include suicidal intent.  FU 12 weeks  [name] Parents, MD, DFAPA    Please see After Visit Summary for patient specific instructions.  No future appointments.   No orders of the defined types were placed in this encounter.      -------------------------------

## 2021-09-02 ENCOUNTER — Other Ambulatory Visit: Payer: Self-pay | Admitting: Psychiatry

## 2021-09-02 DIAGNOSIS — F40240 Claustrophobia: Secondary | ICD-10-CM

## 2021-09-02 DIAGNOSIS — F4001 Agoraphobia with panic disorder: Secondary | ICD-10-CM

## 2021-09-02 DIAGNOSIS — F4310 Post-traumatic stress disorder, unspecified: Secondary | ICD-10-CM

## 2021-09-02 DIAGNOSIS — F411 Generalized anxiety disorder: Secondary | ICD-10-CM

## 2021-09-04 ENCOUNTER — Other Ambulatory Visit: Payer: Self-pay | Admitting: Psychiatry

## 2021-09-04 DIAGNOSIS — F3162 Bipolar disorder, current episode mixed, moderate: Secondary | ICD-10-CM

## 2021-09-15 ENCOUNTER — Other Ambulatory Visit: Payer: Self-pay | Admitting: Psychiatry

## 2021-09-15 DIAGNOSIS — F319 Bipolar disorder, unspecified: Secondary | ICD-10-CM

## 2021-10-13 ENCOUNTER — Ambulatory Visit: Payer: 59 | Admitting: Psychiatry

## 2021-10-14 ENCOUNTER — Other Ambulatory Visit: Payer: Self-pay | Admitting: Psychiatry

## 2021-10-14 DIAGNOSIS — F3162 Bipolar disorder, current episode mixed, moderate: Secondary | ICD-10-CM

## 2021-10-18 ENCOUNTER — Other Ambulatory Visit: Payer: Self-pay | Admitting: Psychiatry

## 2021-10-18 DIAGNOSIS — F319 Bipolar disorder, unspecified: Secondary | ICD-10-CM

## 2021-11-07 ENCOUNTER — Other Ambulatory Visit: Payer: Self-pay | Admitting: Psychiatry

## 2021-11-07 ENCOUNTER — Other Ambulatory Visit: Payer: Self-pay

## 2021-11-07 ENCOUNTER — Encounter: Payer: Self-pay | Admitting: Psychiatry

## 2021-11-07 ENCOUNTER — Ambulatory Visit: Payer: 59 | Admitting: Psychiatry

## 2021-11-07 DIAGNOSIS — F3162 Bipolar disorder, current episode mixed, moderate: Secondary | ICD-10-CM

## 2021-11-07 DIAGNOSIS — F4310 Post-traumatic stress disorder, unspecified: Secondary | ICD-10-CM

## 2021-11-07 DIAGNOSIS — F4001 Agoraphobia with panic disorder: Secondary | ICD-10-CM | POA: Diagnosis not present

## 2021-11-07 DIAGNOSIS — R5382 Chronic fatigue, unspecified: Secondary | ICD-10-CM

## 2021-11-07 DIAGNOSIS — F40240 Claustrophobia: Secondary | ICD-10-CM

## 2021-11-07 DIAGNOSIS — E538 Deficiency of other specified B group vitamins: Secondary | ICD-10-CM

## 2021-11-07 DIAGNOSIS — F319 Bipolar disorder, unspecified: Secondary | ICD-10-CM

## 2021-11-07 DIAGNOSIS — R7989 Other specified abnormal findings of blood chemistry: Secondary | ICD-10-CM

## 2021-11-07 DIAGNOSIS — F411 Generalized anxiety disorder: Secondary | ICD-10-CM | POA: Diagnosis not present

## 2021-11-07 MED ORDER — MODAFINIL 200 MG PO TABS: ORAL_TABLET | ORAL | 2 refills | Status: DC

## 2021-11-07 MED ORDER — LORAZEPAM 1 MG PO TABS: 1.0000 mg | ORAL_TABLET | Freq: Three times a day (TID) | ORAL | 3 refills | Status: DC

## 2021-11-07 MED ORDER — OXCARBAZEPINE 300 MG PO TABS: ORAL_TABLET | ORAL | 0 refills | Status: DC

## 2021-11-07 MED ORDER — ARIPIPRAZOLE 10 MG PO TABS: 10.0000 mg | ORAL_TABLET | Freq: Every day | ORAL | 1 refills | Status: DC

## 2021-11-07 MED ORDER — LAMOTRIGINE 200 MG PO TABS: 200.0000 mg | ORAL_TABLET | Freq: Every day | ORAL | 1 refills | Status: DC

## 2021-11-07 NOTE — Progress Notes (Signed)
LUDIA GARTLAND ?[id number] ?03/22/1966 ?56 y.o. ?  ? ?Subjective:  ? ?Patient ID:  INAYA GILLHAM is a 56 y.o. (DOB 08/21/1966) female. ? ?Chief Complaint:  ?Chief Complaint  ?Patient presents with  ? Follow-up  ?  Bipolar 1 disorder, mixed, moderate (HCC)  ? Medication Problem  ? ? ?Depression ?       Associated symptoms include decreased concentration and fatigue.  Associated symptoms include no headaches and no suicidal ideas.  Past medical history includes anxiety.   ?Anxiety ?Symptoms include decreased concentration. Patient reports no confusion, dizziness, nervous/anxious behavior, palpitations or suicidal ideas.  ? ? ?[name] presents to the office today for follow-up of several dxes. ? ?At visit  January 2020.  She had recently been diagnosed with low vitamin B12.  She was initiating treatment.  No meds were changed. ? ?visit July 2020.  her anxiety was higher after reducing sertraline.  Therefore it was increased back to 100 mg daily. ? ?06/2020 appt with the following noted: ?Cardiologist said she had chronic tx for afib and PVC's and it acts up more when she's under more stress.  Cardiologist suggested Bz when having the episodes.  Better at present but has periods of them daily.  They last an hour or more.  Rarely takes clonazepam, but it helps in 30 mins.  Tolerates clonazepam well otherwise. ?She felt increase sertraline helped some with anxiety.  It is better.  More irritable last couple of weeks than anxious unless heart episode.  Missed a couple of weeks of hormones which might have caused this. ?Panic attacks from 0-2 weekly is typical but episodes where she may have them daily for a couple of weeks associated with the palpitations as noted. ?Plan: DC clonazepam ?Start lorazepam 1 mg every 8 hours as needed anxiety attacks.  Call back if it is less effective than clonazepam or less well-tolerated. ? ?04/06/20 appt with the following noted: ?Real stressed out and feels everything caving in on  her.  Work, home and everything.  D broke her leg and ex H had to stay with them. Supervisor stress and short-staffed.  Doesn't want the vaccine. ?Only one RX lorazepam.  It helped.  But afraid of addiction.  Tends to isolate weekends bc stress. ?Consistent with oxcarb 900 mg HS, sertraline 100, lamotrigine 200. ?Wanting to stay up late and then has EMA.   ?Not hyper, never.  Is irritable.   ?ExH's family gives her a hard time and drags D into it. ?Plan no med changes: ? ?10/05/2020 appointment with the following noted: ?Some anger and anxiety but probably situational over the Covid situation and dealing with work situation.  Wakes that way a lot of times.  Irritable.  Walked out of work one day.  In the background all the time but will come out with triggers.  Controls it usually.  No panic. ?Plan: Increase Trileptal to 300 mg AM & 900 mg PM.  ? ?10/20/20 TC Rtc to patient and she reports feeling overwhelmed and stressed at work causing increased anxiety. She reports "going off" on her boss Friday. She reports there is someone at work going around telling everyone that patient is bipolar. [name]. She doesn't know how she would know. [name]. I asked if this was discussed at her last visit on 10/05/2020 and she said no. We discussed what she is thinking she needs, she  really wants to see what Dr. Clovis Pu thinks and get his opinion. She reports she thinks she needs 2 weeks off to get to feeling better and remove herself from work for right now. Then intermittent from that time. Informed her that is something he would probably do but I would discuss with him. ?OK'd  FMLA ? ?11/23/20 TC: Message from staff: Addendum to previous messages. Next appt is 11/29/20 at 1:30. She has called back and said that she is having trouble at work with her co-workers. She wants to check into a hospital but she told me she didn't have any  suicide thoughts. She is under a lot of stress at work. Her best friend told someone at her employer that she has bipolar and co-workers are giving her a hard time because of the diagnosis. She said that she is still taking her meds. Her blood pressure when taken a couple of days ago was 184/101 and 148/88. She would like to be pointed in the right direction of what she needs to do? Her phone number is [phone]. She would like to speak to someone before her visit on Monday.  ? MD TC with pt:  Work stress with boss spreading info that she's bipolar.  Getting knit picked at work.  Did this to another friend who had bipolar disorder. ?Works [employer].  Talked to [place] yesterday.  Get a final warning writeup and feels it is unjust.  Working there since 2017 and all started in last 2 mos.   ? Was doing fine until this all started.  Feels discriminated against.  No SI. ? Will see her Monday and start FMLA for a couple of weeks and we'll make med changes. ?She agrees to the plan. ? Lynder Parents, MD, DFAPA ? ?11/29/20 appt noted: ?Worked today.  Got new supervisor who's cleaning house.  She feels picked on.  This has made her mad and she has expressed anger at work and that's gotten her written up. ?I feel on edge all the time.  Also overwhelmed at home and problems with daughter as well.  Problems keeping things straight at home. ?More anger and irritability.   Patient denies difficulty with sleep initiation or maintenance. Denies appetite disturbance.  Patient reports that energy and motivation have been good.  She still has complaints of concentration and forgetfulness as previously noted.  Patient denies any suicidal ideation.  Still forgetful.  ? ?01/27/21 appt noted: ?Feels she's being mistreated at work bc she's bipolar.  Plans to get a new job.  Trying to get house ready to sale but D and son won't help with it.  Needed a leave from work.  Had panic attack last week thinking of going there.  Cried at work when there the  last day. ?She is more down as well as agitated and angry.  She has difficulty react really relaxing.  She easily panics.  She has difficulty concentrating.  She has feelings of hopelessness and helplessness.  No suicidal thoughts ?She has not tried the [employer] as suggested so far but agrees to do so. ?Plan: Start Vraylar 1.5 mg daily and if it works will wean other meds. ?Agree with medical leave until August 1  ? ?02/23/2021 appointment with the following noted: ?Disability approved until 02/28/21 but requested until August 1. ?Doesn't feel capable of RTW due to panic over thoughts of it.  Feels people are talking about her negatively at work.  Too anxious being around coworkers. ?Did not start Vraylar bc  scared of antipsychotics. ?Sx continue as noted above. ?HA and GI problems better not at work. Anxiety about work causes these sx and her BP to go up ?Plan: Start Vraylar 1.5 mg daily and if it works will wean other meds.   She agrees this time to take it.  Call if you change your mind or don't tolerate it. ?Agree with extending medical leave until August 8 due to depression, mood swings, anger outbursts, crying spells that interfere with ability to do her work and reduced concentration.  ? ?03/23/2021 phone call from patient:Patient lm to cancel appt on 8/5 stating if she was doing well per Dr Clovis Pu she didn't need it. She rescheduled for October next available. Patient will need a Rx for the Vraylar for samples were given at her last visit. ?04/18/2021 phone call from patient:Pt left a message that the vraylar is to expensive and she will not be taking this medicine. ?Sent prescription for Abilify 10 mg in place of Vraylar ? ?05/31/2021 appointment with the following noted: ?Insurance would not pay for [employer]. ?Abilify 10 mg worked pretty well for 5 and 1/2 weeks.  No SE now. ?Once accidentally took 20 mg and got dizzy with NV. ?Seen mood benefit with less depression and less irritable. ?Had argument with mother  and threatened OD and IVC spent 12 hours in hospital after OD of 5 mg lorazepam but wasn't trying to kill herself but was really mad at mother in a rage.  Doesn't get violent but mother triggers her. ?Fredrich Birks

## 2022-01-01 ENCOUNTER — Other Ambulatory Visit: Payer: Self-pay | Admitting: Psychiatry

## 2022-01-01 DIAGNOSIS — F319 Bipolar disorder, unspecified: Secondary | ICD-10-CM

## 2022-01-24 ENCOUNTER — Ambulatory Visit: Payer: Managed Care, Other (non HMO) | Admitting: Cardiovascular Disease

## 2022-01-24 ENCOUNTER — Encounter: Payer: Self-pay | Admitting: Cardiovascular Disease

## 2022-01-24 ENCOUNTER — Telehealth: Payer: Self-pay | Admitting: Cardiovascular Disease

## 2022-01-24 VITALS — BP 130/80 | HR 66 | Ht 64.0 in | Wt 198.5 lb

## 2022-01-24 DIAGNOSIS — I480 Paroxysmal atrial fibrillation: Secondary | ICD-10-CM

## 2022-01-24 DIAGNOSIS — I10 Essential (primary) hypertension: Secondary | ICD-10-CM

## 2022-01-24 DIAGNOSIS — E782 Mixed hyperlipidemia: Secondary | ICD-10-CM

## 2022-01-24 NOTE — Telephone Encounter (Signed)
Spoke with patient and reviewed signs and symptoms that would require immediate evaluation in the ED and confirmed appointment for today. She verbalized understanding with no further questions at this time.

## 2022-01-24 NOTE — Progress Notes (Signed)
Date:  01/24/2022   ID:  [name] [name], DOB 06-Aug-1966, MRN [id number]  Patient Location:  [place] [address]   Provider location:   [facility], [place] office  PCP:  Pcp, No  Cardiologist:  Patsy Baltimore   Chief Complaint  Patient presents with   Hypertension    Patient c/o elevated blood pressure, right side neck pain, headache and dizziness all day. Medications reviewed by the patient verbally.     History of Present Illness:    [name] [name] is a 56 y.o. female past medical history of GERD, presenting to the hospital 03/27/2013 with tachycardia, shortness of breath. Diagnosed with atrial fibrillation with RVR, heart rate 140s. She was started on Cardizem, beta blocker with improved heart rate control, converting to normal sinus rhythm with short runs of tachycardia. She was discharged on Cardizem 4 times a day, metoprolol 25 mg twice a day. Sleep apnea She presents today for follow-up of her paroxysmal atrial fibrillation  Last seen by myself 1/22 Works at [employer], reports some recent stressors She is concerned as BPs running high at home 027 to 253 systolic PMD called in amlodipine 5 mg daily, has not started  Hot flashes Rare palpitations, does not feel like atrial fibrillation No longer on metoprolol or Cardizem, reports her blood pressure was running low on these medications  On evaluation in clinic September 2020 Was using beta-blocker daily with flecainide as needed Has sleep apnea  EKG personally reviewed by myself on todays visit Normal sinus rhythm rate 66 bpm no significant ST-T wave changes  Other past medical history reviewed ZIO monitor , results reviewed Normal sinus rhythm Avg HR of 76 bpm.    Isolated SVEs were rare (<1.0%), SVE Couplets were rare (<1.0%), and no SVE Triplets were present. Isolated VEs were rare (<1.0%), VE Couplets were rare (<1.0%), and no VE Triplets were present.  Ventricular Bigeminy and Trigeminy were present.   Patient triggered events were normal sinus rhythm, other triggered events were PVCs in bigeminal pattern  stopped all of her meds in May 2015,  She was doing well until October 2015, when she started having sx of fluttering, hard heart beats.   Echocardiogram August 2014 showing normal LV systolic function, LV diastolic relaxation abnormality, normal right ventricular systolic pressure TSH in the hospital was normal, 1.9     Past Medical History:  Diagnosis Date   Anxiety    Arthritis    B12 deficiency    Chicken pox    Depression    Fibroid    GERD (gastroesophageal reflux disease)    History of hiatal hernia    Hypertension    Iron deficiency anemia    Menorrhagia    Migraines    PAF (paroxysmal atrial fibrillation) ([place])    a. 04/2013 Echo: EF 55-60%, impaired relaxation. Mild MR/TR. Nl RV fxn; b. CHA2DS2VASc = 2; c. PRN flecainide.   Snores    Past Surgical History:  Procedure Laterality Date   ABDOMINAL HYSTERECTOMY     APPENDECTOMY  5/11   C-spine surgery     CESAREAN SECTION  10/98,9/12   COLONOSCOPY WITH PROPOFOL N/A 03/26/2019   Procedure: COLONOSCOPY WITH PROPOFOL;  Surgeon: Virgel Manifold, MD;  Location: [facility] ENDOSCOPY;  Service: Endoscopy;  Laterality: N/A;   ESOPHAGOGASTRODUODENOSCOPY (EGD) WITH PROPOFOL N/A 03/26/2019   Procedure: ESOPHAGOGASTRODUODENOSCOPY (EGD) WITH PROPOFOL;  Surgeon: Virgel Manifold, MD;  Location: [facility] ENDOSCOPY;  Service:  Endoscopy;  Laterality: N/A;   HYSTERECTOMY ABDOMINAL WITH SALPINGO-OOPHORECTOMY Bilateral 08/05/2018   Procedure: HYSTERECTOMY ABDOMINAL WITH BILATERAL SALPINGO-OOPHORECTOMY;  Surgeon: Brayton Mars, MD;  Location: [facility] ORS;  Service: Gynecology;  Laterality: Bilateral;   TONSILLECTOMY  03/2008   TUBAL LIGATION        Allergies:   Phenylmercuric nitrate, Augmentin [amoxicillin-pot clavulanate], Codeine, Diclofenac, Latex, Neomycin, and Percocet  [oxycodone-acetaminophen]   Social History   Tobacco Use   Smoking status: Former    Packs/day: 1.00    Years: 10.00    Pack years: 10.00    Types: Cigarettes    Quit date: 08/22/1991    Years since quitting: 30.4   Smokeless tobacco: Never  Vaping Use   Vaping Use: Never used  Substance Use Topics   Alcohol use: Not Currently    Alcohol/week: 0.0 standard drinks    Comment: twice a year    Drug use: Not Currently    Types: Marijuana    Comment: not for a couple years     Current Outpatient Medications on File Prior to Visit  Medication Sig Dispense Refill   amLODipine (NORVASC) 5 MG tablet Take 1 tablet (5 mg total) by mouth daily. 180 tablet 3   ARIPiprazole (ABILIFY) 10 MG tablet Take 1 tablet (10 mg total) by mouth daily. 90 tablet 1   cyclobenzaprine (FLEXERIL) 5 MG tablet Take 1 tablet (5 mg total) by mouth 3 (three) times daily as needed. (Patient not taking: Reported on 08/11/2021) 15 tablet 0   ibuprofen (ADVIL) 200 MG tablet Takes 1-3 tablets as needed     lamoTRIgine (LAMICTAL) 200 MG tablet Take 1 tablet (200 mg total) by mouth at bedtime. 90 tablet 1   LORazepam (ATIVAN) 1 MG tablet Take 1 tablet (1 mg total) by mouth every 8 (eight) hours. 30 tablet 3   modafinil (PROVIGIL) 200 MG tablet 1/2 tablet in AM for 1 week, then 1 tablet in AM 30 tablet 2   Multiple Vitamin (MULTIVITAMIN WITH MINERALS) TABS tablet Take 1 tablet by mouth at bedtime.     omeprazole (PRILOSEC) 40 MG capsule Take 1 capsule (40 mg total) by mouth at bedtime. 30 capsule 6   Oxcarbazepine (TRILEPTAL) 300 MG tablet TAKE 3 TABLETS BY MOUTH EVERY  NIGHT AT BEDTIME 270 tablet 3   sertraline (ZOLOFT) 50 MG tablet TAKE 1 AND 1/2 TABLETS(75 MG) BY MOUTH DAILY 135 tablet 0   No current facility-administered medications on file prior to visit.     Family Hx: The patient's family history includes Alcohol abuse in her father, maternal aunt, maternal uncle, and paternal grandfather; Arthritis in her  father, maternal grandmother, and sister; Diabetes in her mother; Heart disease in her father; Leukemia in her maternal grandfather; Lung cancer in her paternal grandfather; Schizophrenia in her maternal grandmother. There is no history of Breast cancer.  ROS:   Please see the history of present illness.    Review of Systems  Constitutional: Negative.   HENT: Negative.    Respiratory: Negative.    Cardiovascular: Negative.   Gastrointestinal: Negative.   Musculoskeletal: Negative.   Neurological: Negative.   Psychiatric/Behavioral: Negative.    All other systems reviewed and are negative.   Labs/Other Tests and Data Reviewed:    Recent Labs: 04/26/2021: ALT 19; BUN 11; Creatinine, Ser 0.84; Hemoglobin 14.0; Platelets 298; Potassium 3.8; Sodium 139   Recent Lipid Panel Lab Results  Component Value Date/Time   CHOL 217 (H) 03/07/2019 12:27 PM   CHOL 173  03/27/2013 05:00 AM   TRIG 126 03/07/2019 12:27 PM   TRIG 84 03/27/2013 05:00 AM   HDL 68 03/07/2019 12:27 PM   HDL 45 03/27/2013 05:00 AM   CHOLHDL 3.2 03/07/2019 12:27 PM   LDLCALC 124 (H) 03/07/2019 12:27 PM   LDLCALC 111 (H) 03/27/2013 05:00 AM    Wt Readings from Last 3 Encounters:  01/24/22 198 lb 8 oz (90 kg)  04/26/21 199 lb 8.3 oz (90.5 kg)  02/15/21 199 lb 9.6 oz (90.5 kg)     Exam:    Vital Signs: Vital signs may also be detailed in the HPI BP 130/80 (BP Location: Left Arm, Patient Position: Sitting, Cuff Size: Normal)   Pulse 66   Ht '5\' 4"'$  (1.626 m)   Wt 198 lb 8 oz (90 kg)   LMP  (LMP Unknown)   SpO2 98%   BMI 34.07 kg/m    Constitutional:  oriented to person, place, and time. No distress.  HENT:  Head: Grossly normal Eyes:  no discharge. No scleral icterus.  Neck: No JVD, no carotid bruits  Cardiovascular: Regular rate and rhythm, no murmurs appreciated Pulmonary/Chest: Clear to auscultation bilaterally, no wheezes or rails Abdominal: Soft.  no distension.  no tenderness.  Musculoskeletal: Normal  range of motion Neurological:  normal muscle tone. Coordination normal. No atrophy Skin: Skin warm and dry Psychiatric: normal affect, pleasant   ASSESSMENT & PLAN:    Problem List Items Addressed This Visit       Cardiology Problems   PAF (paroxysmal atrial fibrillation) (HCC) - Primary   Relevant Medications   amLODipine (NORVASC) 5 MG tablet   Other Relevant Orders   EKG 12-Lead   HLD (hyperlipidemia)   Relevant Medications   amLODipine (NORVASC) 5 MG tablet   Other Relevant Orders   EKG 12-Lead   Other Visit Diagnoses     Benign essential HTN       Relevant Medications   amLODipine (NORVASC) 5 MG tablet   Other Relevant Orders   EKG 12-Lead     Paroxysmal atrial fibrillation As on last clinic visit, she is not having significant tach arrhythmias concerning for atrial fibrillation As far as we know maintaining normal sinus rhythm No strong indication for anticoagulation at this time  Hyperlipidemia Exercise program, weight loss recommended Last labs available from 2020, cholesterol was trending higher  PTSD Managed by primary care Reports some stress  Essential hypertension Reports having high blood pressure at home possibly exacerbated by stress Primary care has sent in amlodipine 5 mg daily Recommend she could try 2.5 with titration up to 5 mg as blood pressure needs.  Would watch for leg swelling   Total encounter time more than 30 minutes  Greater than 50% was spent in counseling and coordination of care with the patient    Signed, Ida Rogue, [id number], [place], [place] [address]

## 2022-01-24 NOTE — Patient Instructions (Addendum)
Medication Instructions:  No changes  Start the amlodipine 2.5 mg daily Increase if needed up to a 5 mg daily  If you need a refill on your cardiac medications before your next appointment, please call your pharmacy.   Lab work: No new labs needed  Testing/Procedures: No new testing needed  Follow-Up: At [facility], you and your health needs are our priority.  As part of our continuing mission to provide you with exceptional heart care, we have created designated Provider Care Teams.  These Care Teams include your primary Cardiologist (physician) and Advanced Practice Providers (APPs -  Physician Assistants and Nurse Practitioners) who all work together to provide you with the care you need, when you need it.  You will need a follow up appointment in 12 months  Providers on your designated Care Team:   Murray Hodgkins, NP Christell Faith, PA-C Cadence Kathlen Mody, [place]  COVID-19 Vaccine Information can be found at: [email or web address] For questions related to vaccine distribution or appointments, please email [email or web address] or call [phone].

## 2022-01-24 NOTE — Telephone Encounter (Signed)
Pt c/o BP issue: STAT if pt c/o blurred vision, one-sided weakness or slurred speech  1. What are your last 5 BP readings? 198/91, 160/100  2. Are you having any other symptoms (ex. Dizziness, headache, blurred vision, passed out)? dizzy  3. What is your BP issue? Feeling weird head floating

## 2022-01-25 ENCOUNTER — Ambulatory Visit: Payer: Managed Care, Other (non HMO) | Admitting: Nurse Practitioner

## 2022-02-07 ENCOUNTER — Telehealth: Payer: Self-pay | Admitting: Psychiatry

## 2022-02-07 ENCOUNTER — Other Ambulatory Visit: Payer: Self-pay

## 2022-02-07 DIAGNOSIS — F411 Generalized anxiety disorder: Secondary | ICD-10-CM

## 2022-02-07 DIAGNOSIS — F4310 Post-traumatic stress disorder, unspecified: Secondary | ICD-10-CM

## 2022-02-07 DIAGNOSIS — F40240 Claustrophobia: Secondary | ICD-10-CM

## 2022-02-07 DIAGNOSIS — F4001 Agoraphobia with panic disorder: Secondary | ICD-10-CM

## 2022-02-07 MED ORDER — SERTRALINE HCL 50 MG PO TABS: ORAL_TABLET | ORAL | 0 refills | Status: DC

## 2022-02-07 NOTE — Telephone Encounter (Signed)
Rx sent 

## 2022-02-07 NOTE — Telephone Encounter (Signed)
Next visit is 04/25/22. [name] [name] is requesting a refill on her Sertraline 50 mg called to:  [facility] [place], [place] Phone:  [phone]        Phone:  [phone]  Fax:  [phone]

## 2022-02-08 ENCOUNTER — Ambulatory Visit: Payer: 59 | Admitting: Psychiatry

## 2022-03-22 ENCOUNTER — Other Ambulatory Visit: Payer: Self-pay | Admitting: Psychiatry

## 2022-03-22 DIAGNOSIS — F4001 Agoraphobia with panic disorder: Secondary | ICD-10-CM

## 2022-03-22 DIAGNOSIS — F319 Bipolar disorder, unspecified: Secondary | ICD-10-CM

## 2022-03-22 DIAGNOSIS — F40240 Claustrophobia: Secondary | ICD-10-CM

## 2022-03-22 DIAGNOSIS — F4310 Post-traumatic stress disorder, unspecified: Secondary | ICD-10-CM

## 2022-03-22 DIAGNOSIS — F411 Generalized anxiety disorder: Secondary | ICD-10-CM

## 2022-04-25 ENCOUNTER — Encounter: Payer: Self-pay | Admitting: Psychiatry

## 2022-04-25 ENCOUNTER — Ambulatory Visit: Payer: 59 | Admitting: Psychiatry

## 2022-04-25 DIAGNOSIS — F4310 Post-traumatic stress disorder, unspecified: Secondary | ICD-10-CM

## 2022-04-25 DIAGNOSIS — F4001 Agoraphobia with panic disorder: Secondary | ICD-10-CM | POA: Diagnosis not present

## 2022-04-25 DIAGNOSIS — F411 Generalized anxiety disorder: Secondary | ICD-10-CM

## 2022-04-25 DIAGNOSIS — F40240 Claustrophobia: Secondary | ICD-10-CM

## 2022-04-25 DIAGNOSIS — R5382 Chronic fatigue, unspecified: Secondary | ICD-10-CM

## 2022-04-25 DIAGNOSIS — E538 Deficiency of other specified B group vitamins: Secondary | ICD-10-CM

## 2022-04-25 DIAGNOSIS — R7989 Other specified abnormal findings of blood chemistry: Secondary | ICD-10-CM

## 2022-04-25 DIAGNOSIS — F3162 Bipolar disorder, current episode mixed, moderate: Secondary | ICD-10-CM

## 2022-04-25 MED ORDER — MODAFINIL 200 MG PO TABS: ORAL_TABLET | ORAL | 2 refills | Status: DC

## 2022-04-25 NOTE — Patient Instructions (Signed)
Reduce oxcarbazepine by 1/2 tablet daily each week until it is stopped.

## 2022-04-25 NOTE — Progress Notes (Signed)
[name] [name] [id number] 1966/05/14 56 y.o.    Subjective:   Patient ID:  [name] [name] is a 56 y.o. (DOB [birth year]) female.  Chief Complaint:  Chief Complaint  Patient presents with   Follow-up    Bipolar 1 disorder, mixed, moderate ([place])   Anxiety   Post-Traumatic Stress Disorder    Depression        Associated symptoms include decreased concentration and fatigue.  Associated symptoms include no headaches and no suicidal ideas.  Past medical history includes anxiety.   Anxiety Symptoms include decreased concentration. Patient reports no confusion, dizziness, nervous/anxious behavior, palpitations or suicidal ideas.     [name] [name] presents to the office today for follow-up of several dxes.  At visit  January 2020.  She had recently been diagnosed with low vitamin B12.  She was initiating treatment.  No meds were changed.  visit July 2020.  her anxiety was higher after reducing sertraline.  Therefore it was increased back to 100 mg daily.  06/2020 appt with the following noted: Cardiologist said she had chronic tx for afib and PVC's and it acts up more when she's under more stress.  Cardiologist suggested Bz when having the episodes.  Better at present but has periods of them daily.  They last an hour or more.  Rarely takes clonazepam, but it helps in 30 mins.  Tolerates clonazepam well otherwise. She felt increase sertraline helped some with anxiety.  It is better.  More irritable last couple of weeks than anxious unless heart episode.  Missed a couple of weeks of hormones which might have caused this. Panic attacks from 0-2 weekly is typical but episodes where she may have them daily for a couple of weeks associated with the palpitations as noted. Plan: DC clonazepam Start lorazepam 1 mg every 8 hours as needed anxiety attacks.  Call back if it is less effective than clonazepam or less well-tolerated.  04/06/20 appt with the following noted: Real stressed out and feels  everything caving in on her.  Work, home and everything.  D broke her leg and ex H had to stay with them. Supervisor stress and short-staffed.  Doesn't want the vaccine. Only one RX lorazepam.  It helped.  But afraid of addiction.  Tends to isolate weekends bc stress. Consistent with oxcarb 900 mg HS, sertraline 100, lamotrigine 200. Wanting to stay up late and then has EMA.   Not hyper, never.  Is irritable.   ExH's family gives her a hard time and drags D into it. Plan no med changes:  10/05/2020 appointment with the following noted: Some anger and anxiety but probably situational over the Covid situation and dealing with work situation.  Wakes that way a lot of times.  Irritable.  Walked out of work one day.  In the background all the time but will come out with triggers.  Controls it usually.  No panic. Plan: Increase Trileptal to 300 mg AM & 900 mg PM.   10/20/20 TC Rtc to patient and she reports feeling overwhelmed and stressed at work causing increased anxiety. She reports "going off" on her boss Friday. She reports there is someone at work going around telling everyone that patient is bipolar. [name]. She doesn't know how she would know. [name]. I asked if this was discussed at her last visit on 10/05/2020 and she said no. We discussed what she  is thinking she needs, she really wants to see what Dr. Clovis Pu thinks and get his opinion. She reports she thinks she needs 2 weeks off to get to feeling better and remove herself from work for right now. Then intermittent from that time. Informed her that is something he would probably do but I would discuss with him. OK'd  FMLA  11/23/20 TC: Message from staff: Addendum to previous messages. Next appt is 11/29/20 at 1:30. She has called back and said that she is having trouble at work with her co-workers. She wants to check into a hospital but she told me  she didn't have any suicide thoughts. She is under a lot of stress at work. Her best friend told someone at her employer that she has bipolar and co-workers are giving her a hard time because of the diagnosis. She said that she is still taking her meds. Her blood pressure when taken a couple of days ago was 184/101 and 148/88. She would like to be pointed in the right direction of what she needs to do? Her phone number is [phone]. She would like to speak to someone before her visit on Monday.   MD TC with pt:  Work stress with boss spreading info that she's bipolar.  Getting knit picked at work.  Did this to another friend who had bipolar disorder. Works [employer].  Talked to [employer] yesterday.  Get a final warning writeup and feels it is unjust.  Working there since 2017 and all started in last 2 mos.    Was doing fine until this all started.  Feels discriminated against.  No SI.  Will see her Monday and start FMLA for a couple of weeks and we'll make med changes. She agrees to the plan.  Lynder Parents, MD, DFAPA  11/29/20 appt noted: Worked today.  Got new supervisor who's cleaning house.  She feels picked on.  This has made her mad and she has expressed anger at work and that's gotten her written up. I feel on edge all the time.  Also overwhelmed at home and problems with daughter as well.  Problems keeping things straight at home. More anger and irritability.   Patient denies difficulty with sleep initiation or maintenance. Denies appetite disturbance.  Patient reports that energy and motivation have been good.  She still has complaints of concentration and forgetfulness as previously noted.  Patient denies any suicidal ideation.  Still forgetful.   01/27/21 appt noted: Feels she's being mistreated at work bc she's bipolar.  Plans to get a new job.  Trying to get house ready to sale but D and son won't help with it.  Needed a leave from work.  Had panic attack last week thinking of going there.  Cried at  work when there the last day. She is more down as well as agitated and angry.  She has difficulty react really relaxing.  She easily panics.  She has difficulty concentrating.  She has feelings of hopelessness and helplessness.  No suicidal thoughts She has not tried the [employer] as suggested so far but agrees to do so. Plan: Start Vraylar 1.5 mg daily and if it works will wean other meds. Agree with medical leave until August 1   02/23/2021 appointment with the following noted: Disability approved until 02/28/21 but requested until August 1. Doesn't feel capable of RTW due to panic over thoughts of it.  Feels people are talking about her negatively at work.  Too anxious being around coworkers.  Did not start Vraylar bc scared of antipsychotics. Sx continue as noted above. HA and GI problems better not at work. Anxiety about work causes these sx and her BP to go up Plan: Start Vraylar 1.5 mg daily and if it works will wean other meds.   She agrees this time to take it.  Call if you change your mind or don't tolerate it. Agree with extending medical leave until August 8 due to depression, mood swings, anger outbursts, crying spells that interfere with ability to do her work and reduced concentration.   03/23/2021 phone call from patient:Patient lm to cancel appt on 8/5 stating if she was doing well per Dr Clovis Pu she didn't need it. She rescheduled for October next available. Patient will need a Rx for the Vraylar for samples were given at her last visit. 04/18/2021 phone call from patient:Pt left a message that the vraylar is to expensive and she will not be taking this medicine. Sent prescription for Abilify 10 mg in place of Vraylar  05/31/2021 appointment with the following noted: Insurance would not pay for [employer]. Abilify 10 mg worked pretty well for 5 and 1/2 weeks.  No SE now. Once accidentally took 20 mg and got dizzy with NV. Seen mood benefit with less depression and less irritable. Had  argument with mother and threatened OD and IVC spent 12 hours in hospital after OD of 5 mg lorazepam but wasn't trying to kill herself but was really mad at mother in a rage.  Doesn't get violent but mother triggers her. Boss now leaving her alone since her leave.  She's had no work performance problems.  She has talked to boss and it went well.  He's backed off micromanagment. No SE.  Eating less.  Happier. Plan: Continue Abilify 10 mg daily.  Option increase if needed call..  07/19/2021 phone call: Patient complaining that she felt shaky and restless on Abilify 10 mg for several weeks.  She was offered the option to reduce the dose.  08/11/2021 appointment with the following noted: For awhile had restless legs around knees but gone now. Family says she's doing better on it, not as irritable.  Not as on edge.   Still on Abilify 10 mg daily, lamotrigine 200, oxcarbazepine 900 mg HS, sertraline 100 mg daily. Not really depressed generally.  Sleep is ok but awakens early to go to bathroom.  At least 7 hours. Good sleeper.   No bouts SI since here.   Good work function. SE none unless weight gain. Plan: Continue Abilify 10 mg daily.  Option increase if needed call.. Continue Abilify 10 mg daily, lamotrigine 200, oxcarbazepine 900 mg HS,  Reduce sertraline 75 mg daily to see if can lose weight.   11/07/2021 appointment with the following noted: Reduced sertraline to 50 mg for 3 weeks. Doing ok.  Cut to 25 mg daily but too irritable.  Manageable on 50 mg daily. Anxiety about the same and OK with benefit with Ativan.  Life is stressful.   [place] with depression.  Calmer on Abilify.  But used to chaos and it seems bored.   Satisfied with meds. Tired all the time.  Sleep is good.  Alsways been able to sleep a lot. D is ADD and she questions Being ADD. IN: Continue Abilify 10 mg daily.  Option increase if needed call.. Continue Abilify 10 mg daily, lamotrigine 200, oxcarbazepine 900 mg HS,  Reduce  sertraline 50 mg daily to see if can lose weight. OK trial  modafinil for poss ADD and chronic fatigue 100-'200mg'$  am .  Disc GoodRX  04/25/2022 appointment noted: Never got modafinil DT cost Fine with reduced sertraline. Anxiety is not worse and mood is better.  Don't let stuff bother me as much as I used to. Still has a lot of chronic anxiety. Feels better with Abilify.  Less obs thoughts and less irritable and not yelling.  Was told she had low b12, still working on getting it up.  Getting shots.  hysterectomy for endometriosis and normally takes a patch distributing hormones though she is recently been out and it worsened her mood when she was not taking it..  Worried about hormones affecting mood.  Hx bad PMS.  Prior psychiatric medication trials include  sertraline,  Depakote,  lithium, lamotrigine, Trileptal 900, Saphris, risperidone, Vraylar 1.5 worked but $, Abilify 10 Clonazepam.  Xanax SE, lorazepam 1.  Review of Systems:  Review of Systems  Constitutional:  Positive for fatigue. Negative for fever.  Cardiovascular:  Negative for palpitations.  Neurological:  Negative for dizziness, tremors and headaches.  Psychiatric/Behavioral:  Positive for decreased concentration. Negative for agitation, behavioral problems, confusion, dysphoric mood, hallucinations, self-injury, sleep disturbance and suicidal ideas. The patient is not nervous/anxious and is not hyperactive.     Medications: I have reviewed the patient's current medications.  Current Outpatient Medications  Medication Sig Dispense Refill   amLODipine (NORVASC) 5 MG tablet Take 1 tablet (5 mg total) by mouth daily. 180 tablet 3   ARIPiprazole (ABILIFY) 10 MG tablet Take 1 tablet (10 mg total) by mouth daily. 90 tablet 1   cyclobenzaprine (FLEXERIL) 5 MG tablet Take 1 tablet (5 mg total) by mouth 3 (three) times daily as needed. 15 tablet 0   ibuprofen (ADVIL) 200 MG tablet Takes 1-3 tablets as needed     lamoTRIgine  (LAMICTAL) 200 MG tablet Take 1 tablet (200 mg total) by mouth at bedtime. 90 tablet 1   LORazepam (ATIVAN) 1 MG tablet Take 1 tablet (1 mg total) by mouth every 8 (eight) hours. 30 tablet 3   Multiple Vitamin (MULTIVITAMIN WITH MINERALS) TABS tablet Take 1 tablet by mouth at bedtime.     omeprazole (PRILOSEC) 40 MG capsule Take 1 capsule (40 mg total) by mouth at bedtime. 30 capsule 6   Oxcarbazepine (TRILEPTAL) 300 MG tablet TAKE 3 TABLETS BY MOUTH EVERY  NIGHT AT BEDTIME 270 tablet 3   sertraline (ZOLOFT) 50 MG tablet TAKE 1 AND 1/2 TABLETS(75 MG) BY MOUTH DAILY (Patient taking differently: Take 50 mg by mouth daily. TAKE 1 AND 1/2 TABLETS(75 MG) BY MOUTH DAILY) 135 tablet 0   modafinil (PROVIGIL) 200 MG tablet 1/2 tablet in AM for 1 week, then 1 tablet in AM 30 tablet 2   No current facility-administered medications for this visit.    Medication Side Effects: None  Allergies:  Allergies  Allergen Reactions   Phenylmercuric Nitrate Rash   Augmentin [Amoxicillin-Pot Clavulanate] Nausea Only    Has patient had a PCN reaction causing immediate rash, facial/tongue/throat swelling, SOB or lightheadedness with hypotension: No Has patient had a PCN reaction causing severe rash involving mucus membranes or skin necrosis: No Has patient had a PCN reaction that required hospitalization: No Has patient had a PCN reaction occurring within the last 10 years: Yes If all of the above answers are "NO", then may proceed with Cephalosporin use.    Codeine Nausea Only   Diclofenac Nausea Only   Latex Rash   Neomycin Rash  Percocet [Oxycodone-Acetaminophen] Rash    Hives     Past Medical History:  Diagnosis Date   Anxiety    Arthritis    B12 deficiency    Chicken pox    Depression    Fibroid    GERD (gastroesophageal reflux disease)    History of hiatal hernia    Hypertension    Iron deficiency anemia    Menorrhagia    Migraines    PAF (paroxysmal atrial fibrillation) ([place])    a.  04/2013 Echo: EF 55-60%, impaired relaxation. Mild MR/TR. Nl RV fxn; b. CHA2DS2VASc = 2; c. PRN flecainide.   Snores     Family History  Problem Relation Age of Onset   Diabetes Mother    Alcohol abuse Father    Arthritis Father    Heart disease Father    Arthritis Sister    Alcohol abuse Maternal Aunt    Alcohol abuse Maternal Uncle    Alcohol abuse Paternal Grandfather    Lung cancer Paternal Grandfather        smoker    Arthritis Maternal Grandmother    Schizophrenia Maternal Grandmother    Leukemia Maternal Grandfather    Breast cancer Neg Hx     Social History   Socioeconomic History   Marital status: Legally Separated    Spouse name: Not on file   Number of children: Not on file   Years of education: Not on file   Highest education level: Not on file  Occupational History   Not on file  Tobacco Use   Smoking status: Former    Packs/day: 1.00    Years: 10.00    Total pack years: 10.00    Types: Cigarettes    Quit date: 08/22/1991    Years since quitting: 30.6   Smokeless tobacco: Never  Vaping Use   Vaping Use: Never used  Substance and Sexual Activity   Alcohol use: Not Currently    Alcohol/week: 0.0 standard drinks of alcohol    Comment: twice a year    Drug use: Not Currently    Types: Marijuana    Comment: not for a couple years   Sexual activity: Yes    Partners: Male    Birth control/protection: Surgical    Comment: Husband  Other Topics Concern   Not on file  Social History Narrative   [employer]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    Lives with husband and 2 children    Son- 35 yo and daughter 9 yo    Pets: 2 dogs    Caffeine- 2 sodas 20 oz, coffee 1 cup occasionally    Enjoys being outside with family       DPR mom Horton Chin [phone]    Social Determinants of Health   Financial Resource Strain: Not on file  Food Insecurity: Not on file  Transportation Needs: Not on file  Physical Activity: Not on file  Stress: Not on file  Social  Connections: Not on file  Intimate Partner Violence: Not on file    Past Medical History, Surgical history, Social history, and Family history were reviewed and updated as appropriate.   Please see review of systems for further details on the patient's review from today.   Objective:   Physical Exam:  LMP  (LMP Unknown)   Physical Exam Constitutional:      General: She is not in acute distress. Musculoskeletal:        General: No deformity.  Neurological:  Mental Status: She is alert and oriented to person, place, and time.     Cranial Nerves: No dysarthria.     Coordination: Coordination normal.  Psychiatric:        Attention and Perception: Attention and perception normal. She does not perceive auditory or visual hallucinations.        Mood and Affect: Mood is anxious. Mood is not depressed. Affect is not labile, angry or inappropriate.        Speech: Speech normal.        Behavior: Behavior normal. Behavior is cooperative.        Thought Content: Thought content is not paranoid or delusional. Thought content does not include homicidal or suicidal ideation. Thought content does not include suicidal plan.        Cognition and Memory: Cognition and memory normal.        Judgment: Judgment normal.     Comments: Insight intact Her psychiatric symptoms are better now Affect positive Not irritable.     Lab Review:     Component Value Date/Time   NA 139 04/26/2021 1615   NA 140 05/24/2020 1701   NA 138 11/25/2014 2152   K 3.8 04/26/2021 1615   K 3.7 11/25/2014 2152   CL 106 04/26/2021 1615   CL 105 11/25/2014 2152   CO2 25 04/26/2021 1615   CO2 28 11/25/2014 2152   GLUCOSE 96 04/26/2021 1615   GLUCOSE 98 11/25/2014 2152   BUN 11 04/26/2021 1615   BUN 11 05/24/2020 1701   BUN 10 11/25/2014 2152   CREATININE 0.84 04/26/2021 1615   CREATININE 0.81 11/25/2014 2152   CALCIUM 10.1 04/26/2021 1615   CALCIUM 10.0 11/25/2014 2152   PROT 7.4 04/26/2021 1615   PROT 6.9  05/24/2020 1701   PROT 7.2 11/25/2014 2152   ALBUMIN 4.2 04/26/2021 1615   ALBUMIN 4.6 05/24/2020 1701   ALBUMIN 4.1 11/25/2014 2152   AST 19 04/26/2021 1615   AST 15 11/25/2014 2152   ALT 19 04/26/2021 1615   ALT 13 (L) 11/25/2014 2152   ALKPHOS 88 04/26/2021 1615   ALKPHOS 61 11/25/2014 2152   BILITOT 0.6 04/26/2021 1615   BILITOT 0.2 05/24/2020 1701   BILITOT 0.4 11/25/2014 2152   GFRNONAA >60 04/26/2021 1615   GFRNONAA >60 11/25/2014 2152   GFRAA 90 05/24/2020 1701   GFRAA >60 11/25/2014 2152       Component Value Date/Time   WBC 4.5 04/26/2021 1615   RBC 4.70 04/26/2021 1615   HGB 14.0 04/26/2021 1615   HGB 13.9 06/14/2020 1625   HCT 40.4 04/26/2021 1615   HCT 41.7 06/14/2020 1625   PLT 298 04/26/2021 1615   PLT 367 06/14/2020 1625   MCV 86.0 04/26/2021 1615   MCV 86 06/14/2020 1625   MCV 77 (L) 11/25/2014 2152   MCH 29.8 04/26/2021 1615   MCHC 34.7 04/26/2021 1615   RDW 13.5 04/26/2021 1615   RDW 13.2 06/14/2020 1625   RDW 22.4 (H) 11/25/2014 2152   LYMPHSABS 2.3 05/24/2020 1701   LYMPHSABS 2.4 03/27/2013 0500   MONOABS 0.4 07/30/2018 1033   MONOABS 0.4 03/27/2013 0500   EOSABS 0.1 05/24/2020 1701   EOSABS 0.2 03/27/2013 0500   BASOSABS 0.0 05/24/2020 1701   BASOSABS 0.0 03/27/2013 0500    No results found for: "POCLITH", "LITHIUM"   No results found for: "PHENYTOIN", "PHENOBARB", "VALPROATE", "CBMZ"   .res Assessment: Plan:    Jolin was seen today for follow-up, anxiety and post-traumatic stress  disorder.  Diagnoses and all orders for this visit:  Bipolar 1 disorder, mixed, moderate ([facility]) -     modafinil (PROVIGIL) 200 MG tablet; 1/2 tablet in AM for 1 week, then 1 tablet in AM  PTSD (post-traumatic stress disorder)  Generalized anxiety disorder  Panic disorder with agoraphobia  Chronic fatigue -     modafinil (PROVIGIL) 200 MG tablet; 1/2 tablet in AM for 1 week, then 1 tablet in AM  Claustrophobia  B12 deficiency  Low vitamin D  level   Greater than 50% of 30 minutes of non-face to face time with patient was spent on counseling and coordination of care. We discussed Brayla has a history of bipolar disorder with heavy seasonal component .  l she has addressed the work stress and it is much better at this time.  Vraylar helped her mood but she could not afford it.  We switched to Abilify 10 mg which she has been on for about several weeks and so far mood stability and depression are better.  She is not having any side effects.  We We discussed in depth the dosing range of Abilify and typically 15 mg is the usual mood stabilizing dose and she is below that at 10 mg.  However she had side effects at 20 mg which she took accidentally.  She previously had exacerbation of anxiety when she attempted to wean off sertraline but will try again going slower to maybe help weight loss.  She has tried all non-antipsychotic mood stabilizers except CBZ which is similar to oxcarbazapine that has been inadequate. Disc jer fears about antipsychotics in detail and she agrees. Discussed potential metabolic side effects associated with atypical antipsychotics, as well as potential risk for movement side effects. Advised pt to contact office if movement side effects occur.   Continue Abilify 10 mg daily.  Option increase if needed call.. it helped a lot. Continue Abilify 10 mg daily, lamotrigine 200 Want to try to wean oxcarbazapine bc benefit Abilify.  If gets worse increase Ability Reduce oxcarbazepine by 1/2 tablet daily each week until it is stopped.  OK with Reduced sertraline 50 mg daily to see if can lose weight.  OK trial modafinil for poss ADD and chronic fatigue 100-'200mg'$  am .  Disc [employer]  Low B12 and vitamin D can contribute to psych problems and can be consequence of AEDs.  We discussed the short-term risks associated with benzodiazepines including sedation and increased fall risk among others.  Discussed long-term side effect  risk including dependence, potential withdrawal symptoms, and the potential eventual dose-related risk of dementia.  Disc risk dependence in detail in terms of the amount that can be used without it. Ativan less sedating than Xanax and helps. Prn panic  Asks for FMLA bc doctor visits and occ bad day of anxiety bc new supervisor is very strict.  Discussed safety plan at length with patient.  Advised patient to contact office with any worsening signs and symptoms.  Instructed patient to go to the [facility] emergency room for evaluation if experiencing any acute safety concerns, to include suicidal intent.  FU 12 weeks  Lynder Parents, MD, DFAPA    Please see After Visit Summary for patient specific instructions.  No future appointments.   No orders of the defined types were placed in this encounter.      -------------------------------

## 2022-04-26 ENCOUNTER — Other Ambulatory Visit: Payer: Self-pay | Admitting: Psychiatry

## 2022-04-26 DIAGNOSIS — F319 Bipolar disorder, unspecified: Secondary | ICD-10-CM

## 2022-05-13 ENCOUNTER — Other Ambulatory Visit: Payer: Self-pay | Admitting: Psychiatry

## 2022-05-13 DIAGNOSIS — F4310 Post-traumatic stress disorder, unspecified: Secondary | ICD-10-CM

## 2022-05-13 DIAGNOSIS — F411 Generalized anxiety disorder: Secondary | ICD-10-CM

## 2022-05-13 DIAGNOSIS — F4001 Agoraphobia with panic disorder: Secondary | ICD-10-CM

## 2022-05-13 DIAGNOSIS — F40240 Claustrophobia: Secondary | ICD-10-CM

## 2022-05-15 NOTE — Telephone Encounter (Signed)
Patient said she is taking 1 tablet daily, not 1.5 tablets. Last note says okay to reduce to 50 mg due to weight gain.

## 2022-06-01 ENCOUNTER — Other Ambulatory Visit: Payer: Self-pay | Admitting: Psychiatry

## 2022-06-01 DIAGNOSIS — F3162 Bipolar disorder, current episode mixed, moderate: Secondary | ICD-10-CM

## 2022-06-27 ENCOUNTER — Other Ambulatory Visit: Payer: Self-pay | Admitting: Psychiatry

## 2022-06-27 DIAGNOSIS — F4001 Agoraphobia with panic disorder: Secondary | ICD-10-CM

## 2022-06-28 NOTE — Telephone Encounter (Signed)
Filled 8/6 appt 12/7

## 2022-07-19 ENCOUNTER — Other Ambulatory Visit: Payer: Self-pay | Admitting: Psychiatry

## 2022-07-19 DIAGNOSIS — F319 Bipolar disorder, unspecified: Secondary | ICD-10-CM

## 2022-07-27 ENCOUNTER — Ambulatory Visit: Payer: 59 | Admitting: Psychiatry

## 2022-07-27 ENCOUNTER — Encounter: Payer: Self-pay | Admitting: Psychiatry

## 2022-07-27 DIAGNOSIS — F3162 Bipolar disorder, current episode mixed, moderate: Secondary | ICD-10-CM | POA: Diagnosis not present

## 2022-07-27 DIAGNOSIS — E538 Deficiency of other specified B group vitamins: Secondary | ICD-10-CM

## 2022-07-27 DIAGNOSIS — F4310 Post-traumatic stress disorder, unspecified: Secondary | ICD-10-CM | POA: Diagnosis not present

## 2022-07-27 DIAGNOSIS — F411 Generalized anxiety disorder: Secondary | ICD-10-CM

## 2022-07-27 DIAGNOSIS — F40240 Claustrophobia: Secondary | ICD-10-CM

## 2022-07-27 DIAGNOSIS — F4001 Agoraphobia with panic disorder: Secondary | ICD-10-CM | POA: Diagnosis not present

## 2022-07-27 DIAGNOSIS — R5382 Chronic fatigue, unspecified: Secondary | ICD-10-CM

## 2022-07-27 DIAGNOSIS — R7989 Other specified abnormal findings of blood chemistry: Secondary | ICD-10-CM

## 2022-07-27 NOTE — Progress Notes (Signed)
[name] [name] [id number] 1966-03-22 56 y.o.    Subjective:   Patient ID:  [name] [name] is a 56 y.o. (DOB June 11, 1966) female.  Chief Complaint:  Chief Complaint  Patient presents with   Follow-up    Bipolar 1 disorder, mixed, moderate ([place])   Anxiety   Post-Traumatic Stress Disorder    Depression        Associated symptoms include decreased concentration and fatigue.  Associated symptoms include no headaches and no suicidal ideas.  Past medical history includes anxiety.   Anxiety Symptoms include decreased concentration and nervous/anxious behavior. Patient reports no confusion, dizziness, palpitations or suicidal ideas.     [name] [name] presents to the office today for follow-up of several dxes.  At visit  January 2020.  She had recently been diagnosed with low vitamin B12.  She was initiating treatment.  No meds were changed.  visit July 2020.  her anxiety was higher after reducing sertraline.  Therefore it was increased back to 100 mg daily.  06/2020 appt with the following noted: Cardiologist said she had chronic tx for afib and PVC's and it acts up more when she's under more stress.  Cardiologist suggested Bz when having the episodes.  Better at present but has periods of them daily.  They last an hour or more.  Rarely takes clonazepam, but it helps in 30 mins.  Tolerates clonazepam well otherwise. She felt increase sertraline helped some with anxiety.  It is better.  More irritable last couple of weeks than anxious unless heart episode.  Missed a couple of weeks of hormones which might have caused this. Panic attacks from 0-2 weekly is typical but episodes where she may have them daily for a couple of weeks associated with the palpitations as noted. Plan: DC clonazepam Start lorazepam 1 mg every 8 hours as needed anxiety attacks.  Call back if it is less effective than clonazepam or less well-tolerated.  04/06/20 appt with the following noted: Real stressed out and  feels everything caving in on her.  Work, home and everything.  D broke her leg and ex H had to stay with them. Supervisor stress and short-staffed.  Doesn't want the vaccine. Only one RX lorazepam.  It helped.  But afraid of addiction.  Tends to isolate weekends bc stress. Consistent with oxcarb 900 mg HS, sertraline 100, lamotrigine 200. Wanting to stay up late and then has EMA.   Not hyper, never.  Is irritable.   ExH's family gives her a hard time and drags D into it. Plan no med changes:  10/05/2020 appointment with the following noted: Some anger and anxiety but probably situational over the Covid situation and dealing with work situation.  Wakes that way a lot of times.  Irritable.  Walked out of work one day.  In the background all the time but will come out with triggers.  Controls it usually.  No panic. Plan: Increase Trileptal to 300 mg AM & 900 mg PM.   10/20/20 TC Rtc to patient and she reports feeling overwhelmed and stressed at work causing increased anxiety. She reports "going off" on her boss Friday. She reports there is someone at work going around telling everyone that patient is bipolar. [name]. She doesn't know how she would know. [name]. I asked if this was discussed at her last visit on 10/05/2020 and she said no. We discussed what  she is thinking she needs, she really wants to see what Dr. Clovis Pu thinks and get his opinion. She reports she thinks she needs 2 weeks off to get to feeling better and remove herself from work for right now. Then intermittent from that time. Informed her that is something he would probably do but I would discuss with him. OK'd  FMLA  11/23/20 TC: Message from staff: Addendum to previous messages. Next appt is 11/29/20 at 1:30. She has called back and said that she is having trouble at work with her co-workers. She wants to check into a hospital but she  told me she didn't have any suicide thoughts. She is under a lot of stress at work. Her best friend told someone at her employer that she has bipolar and co-workers are giving her a hard time because of the diagnosis. She said that she is still taking her meds. Her blood pressure when taken a couple of days ago was 184/101 and 148/88. She would like to be pointed in the right direction of what she needs to do? Her phone number is [phone]. She would like to speak to someone before her visit on Monday.   MD TC with pt:  Work stress with boss spreading info that she's bipolar.  Getting knit picked at work.  Did this to another friend who had bipolar disorder. Works [employer].  Talked to [place] yesterday.  Get a final warning writeup and feels it is unjust.  Working there since 2017 and all started in last 2 mos.    Was doing fine until this all started.  Feels discriminated against.  No SI.  Will see her Monday and start FMLA for a couple of weeks and we'll make med changes. She agrees to the plan.  [name] Parents, MD, DFAPA  11/29/20 appt noted: Worked today.  Got new supervisor who's cleaning house.  She feels picked on.  This has made her mad and she has expressed anger at work and that's gotten her written up. I feel on edge all the time.  Also overwhelmed at home and problems with daughter as well.  Problems keeping things straight at home. More anger and irritability.   Patient denies difficulty with sleep initiation or maintenance. Denies appetite disturbance.  Patient reports that energy and motivation have been good.  She still has complaints of concentration and forgetfulness as previously noted.  Patient denies any suicidal ideation.  Still forgetful.   01/27/21 appt noted: Feels she's being mistreated at work bc she's bipolar.  Plans to get a new job.  Trying to get house ready to sale but D and son won't help with it.  Needed a leave from work.  Had panic attack last week thinking of going there.   Cried at work when there the last day. She is more down as well as agitated and angry.  She has difficulty react really relaxing.  She easily panics.  She has difficulty concentrating.  She has feelings of hopelessness and helplessness.  No suicidal thoughts She has not tried the [employer] as suggested so far but agrees to do so. Plan: Start Vraylar 1.5 mg daily and if it works will wean other meds. Agree with medical leave until August 1   02/23/2021 appointment with the following noted: Disability approved until 02/28/21 but requested until August 1. Doesn't feel capable of RTW due to panic over thoughts of it.  Feels people are talking about her negatively at work.  Too anxious being around  coworkers. Did not start Vraylar bc scared of antipsychotics. Sx continue as noted above. HA and GI problems better not at work. Anxiety about work causes these sx and her BP to go up Plan: Start Vraylar 1.5 mg daily and if it works will wean other meds.   She agrees this time to take it.  Call if you change your mind or don't tolerate it. Agree with extending medical leave until August 8 due to depression, mood swings, anger outbursts, crying spells that interfere with ability to do her work and reduced concentration.   03/23/2021 phone call from patient:Patient lm to cancel appt on 8/5 stating if she was doing well per Dr Clovis Pu she didn't need it. She rescheduled for October next available. Patient will need a Rx for the Vraylar for samples were given at her last visit. 04/18/2021 phone call from patient:Pt left a message that the vraylar is to expensive and she will not be taking this medicine. Sent prescription for Abilify 10 mg in place of Vraylar  05/31/2021 appointment with the following noted: Insurance would not pay for [employer]. Abilify 10 mg worked pretty well for 5 and 1/2 weeks.  No SE now. Once accidentally took 20 mg and got dizzy with NV. Seen mood benefit with less depression and less  irritable. Had argument with mother and threatened OD and IVC spent 12 hours in hospital after OD of 5 mg lorazepam but wasn't trying to kill herself but was really mad at mother in a rage.  Doesn't get violent but mother triggers her. Boss now leaving her alone since her leave.  She's had no work performance problems.  She has talked to boss and it went well.  He's backed off micromanagment. No SE.  Eating less.  Happier. Plan: Continue Abilify 10 mg daily.  Option increase if needed call..  07/19/2021 phone call: Patient complaining that she felt shaky and restless on Abilify 10 mg for several weeks.  She was offered the option to reduce the dose.  08/11/2021 appointment with the following noted: For awhile had restless legs around knees but gone now. Family says she's doing better on it, not as irritable.  Not as on edge.   Still on Abilify 10 mg daily, lamotrigine 200, oxcarbazepine 900 mg HS, sertraline 100 mg daily. Not really depressed generally.  Sleep is ok but awakens early to go to bathroom.  At least 7 hours. Good sleeper.   No bouts SI since here.   Good work function. SE none unless weight gain. Plan: Continue Abilify 10 mg daily.  Option increase if needed call.. Continue Abilify 10 mg daily, lamotrigine 200, oxcarbazepine 900 mg HS,  Reduce sertraline 75 mg daily to see if can lose weight.   11/07/2021 appointment with the following noted: Reduced sertraline to 50 mg for 3 weeks. Doing ok.  Cut to 25 mg daily but too irritable.  Manageable on 50 mg daily. Anxiety about the same and OK with benefit with Ativan.  Life is stressful.   Orient with depression.  Calmer on Abilify.  But used to chaos and it seems bored.   Satisfied with meds. Tired all the time.  Sleep is good.  Alsways been able to sleep a lot. D is ADD and she questions Being ADD. IN: Continue Abilify 10 mg daily.  Option increase if needed call.. Continue Abilify 10 mg daily, lamotrigine 200, oxcarbazepine 900  mg HS,  Reduce sertraline 50 mg daily to see if can lose weight. OK  trial modafinil for poss ADD and chronic fatigue 100-'200mg'$  am .  Disc [facility]  04/25/2022 appointment noted: Never got modafinil DT cost Fine with reduced sertraline. Anxiety is not worse and mood is better.  Don't let stuff bother me as much as I used to. Still has a lot of chronic anxiety. Feels better with Abilify.  Less obs thoughts and less irritable and not yelling.  07/27/22 appt noted; No problems with reduction oxcarb.  No SE change. Anxiety is a little worse and thinks it comes from work.  Not feel anxious in her mind but her body reacts with sweating and N.  Anx not worse in other situations.  Gets racing thoughts more now, repetitive.   Modafinil didn't help fatigue or alterness and caused SE insomnia. Otherwise sleeps well.  8 hr work days and 10 h weekends. M and son say she has better mood with Abilify.  Was told she had low b12, still working on getting it up.  Getting shots.  hysterectomy for endometriosis and normally takes a patch distributing hormones though she is recently been out and it worsened her mood when she was not taking it..  Worried about hormones affecting mood.  Hx bad PMS.  Prior psychiatric medication trials include  sertraline,  Depakote,  lithium, lamotrigine, Trileptal 900, Saphris, risperidone, Vraylar 1.5 worked but $, Abilify 10 Clonazepam.  Xanax SE, lorazepam 1. Modafinil NR and SE ins  F psych sx better with Abilify  Review of Systems:  Review of Systems  Constitutional:  Positive for fatigue. Negative for fever.  Cardiovascular:  Negative for palpitations.  Neurological:  Negative for dizziness, tremors and headaches.  Psychiatric/Behavioral:  Positive for decreased concentration. Negative for agitation, behavioral problems, confusion, dysphoric mood, hallucinations, self-injury, sleep disturbance and suicidal ideas. The patient is nervous/anxious. The patient is not  hyperactive.     Medications: I have reviewed the patient's current medications.  Current Outpatient Medications  Medication Sig Dispense Refill   amLODipine (NORVASC) 5 MG tablet Take 1 tablet (5 mg total) by mouth daily. 180 tablet 3   ARIPiprazole (ABILIFY) 10 MG tablet TAKE 1 TABLET(10 MG) BY MOUTH DAILY 90 tablet 0   cyclobenzaprine (FLEXERIL) 5 MG tablet Take 1 tablet (5 mg total) by mouth 3 (three) times daily as needed. 15 tablet 0   ibuprofen (ADVIL) 200 MG tablet Takes 1-3 tablets as needed     lamoTRIgine (LAMICTAL) 200 MG tablet TAKE 1 TABLET BY MOUTH AT  BEDTIME 90 tablet 3   LORazepam (ATIVAN) 1 MG tablet TAKE 1 TABLET(1 MG) BY MOUTH EVERY 8 HOURS 30 tablet 2   Multiple Vitamin (MULTIVITAMIN WITH MINERALS) TABS tablet Take 1 tablet by mouth at bedtime.     omeprazole (PRILOSEC) 40 MG capsule Take 1 capsule (40 mg total) by mouth at bedtime. 30 capsule 6   Oxcarbazepine (TRILEPTAL) 300 MG tablet TAKE 3 TABLETS BY MOUTH EVERY  NIGHT AT BEDTIME (Patient taking differently: Take 450 mg by mouth daily. TAKE 3 TABLETS BY MOUTH EVERY  NIGHT AT BEDTIME) 270 tablet 3   sertraline (ZOLOFT) 50 MG tablet TAKE 1 TABLET BY MOUTH DAILY 90 tablet 0   No current facility-administered medications for this visit.    Medication Side Effects: None  Allergies:  Allergies  Allergen Reactions   Phenylmercuric Nitrate Rash   Augmentin [Amoxicillin-Pot Clavulanate] Nausea Only    Has patient had a PCN reaction causing immediate rash, facial/tongue/throat swelling, SOB or lightheadedness with hypotension: No Has patient had a PCN  reaction causing severe rash involving mucus membranes or skin necrosis: No Has patient had a PCN reaction that required hospitalization: No Has patient had a PCN reaction occurring within the last 10 years: Yes If all of the above answers are "NO", then may proceed with Cephalosporin use.    Codeine Nausea Only   Diclofenac Nausea Only   Latex Rash   Neomycin Rash    Percocet [Oxycodone-Acetaminophen] Rash    Hives     Past Medical History:  Diagnosis Date   Anxiety    Arthritis    B12 deficiency    Chicken pox    Depression    Fibroid    GERD (gastroesophageal reflux disease)    History of hiatal hernia    Hypertension    Iron deficiency anemia    Menorrhagia    Migraines    PAF (paroxysmal atrial fibrillation) ([place])    a. 04/2013 Echo: EF 55-60%, impaired relaxation. Mild MR/TR. Nl RV fxn; b. CHA2DS2VASc = 2; c. PRN flecainide.   Snores     Family History  Problem Relation Age of Onset   Diabetes Mother    Alcohol abuse Father    Arthritis Father    Heart disease Father    Arthritis Sister    Alcohol abuse Maternal Aunt    Alcohol abuse Maternal Uncle    Alcohol abuse Paternal Grandfather    Lung cancer Paternal Grandfather        smoker    Arthritis Maternal Grandmother    Schizophrenia Maternal Grandmother    Leukemia Maternal Grandfather    Breast cancer Neg Hx     Social History   Socioeconomic History   Marital status: Legally Separated    Spouse name: Not on file   Number of children: Not on file   Years of education: Not on file   Highest education level: Not on file  Occupational History   Not on file  Tobacco Use   Smoking status: Former    Packs/day: 1.00    Years: 10.00    Total pack years: 10.00    Types: Cigarettes    Quit date: 08/22/1991    Years since quitting: 30.9   Smokeless tobacco: Never  Vaping Use   Vaping Use: Never used  Substance and Sexual Activity   Alcohol use: Not Currently    Alcohol/week: 0.0 standard drinks of alcohol    Comment: twice a year    Drug use: Not Currently    Types: Marijuana    Comment: not for a couple years   Sexual activity: Yes    Partners: Male    Birth control/protection: Surgical    Comment: Husband  Other Topics Concern   Not on file  Social History Narrative   [employer]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    Lives with husband and 2 children    Son-  34 yo and daughter 8 yo    Pets: 2 dogs    Caffeine- 2 sodas 20 oz, coffee 1 cup occasionally    Enjoys being outside with family       DPR mom Horton Chin [phone]    Social Determinants of Health   Financial Resource Strain: Not on file  Food Insecurity: Not on file  Transportation Needs: Not on file  Physical Activity: Not on file  Stress: Not on file  Social Connections: Not on file  Intimate Partner Violence: Not on file    Past Medical History, Surgical history, Social history,  and Family history were reviewed and updated as appropriate.   Please see review of systems for further details on the patient's review from today.   Objective:   Physical Exam:  LMP  (LMP Unknown)   Physical Exam Constitutional:      General: She is not in acute distress. Musculoskeletal:        General: No deformity.  Neurological:     Mental Status: She is alert and oriented to person, place, and time.     Cranial Nerves: No dysarthria.     Coordination: Coordination normal.  Psychiatric:        Attention and Perception: Attention and perception normal. She does not perceive auditory or visual hallucinations.        Mood and Affect: Mood is anxious. Mood is not depressed. Affect is not labile, angry or inappropriate.        Speech: Speech normal.        Behavior: Behavior normal. Behavior is cooperative.        Thought Content: Thought content is not paranoid or delusional. Thought content does not include homicidal or suicidal ideation. Thought content does not include suicidal plan.        Cognition and Memory: Cognition and memory normal.        Judgment: Judgment normal.     Comments: Insight intact Her psychiatric symptoms are better now Affect positive Not irritable. More anxious     Lab Review:     Component Value Date/Time   NA 139 04/26/2021 1615   NA 140 05/24/2020 1701   NA 138 11/25/2014 2152   K 3.8 04/26/2021 1615   K 3.7 11/25/2014 2152   CL 106  04/26/2021 1615   CL 105 11/25/2014 2152   CO2 25 04/26/2021 1615   CO2 28 11/25/2014 2152   GLUCOSE 96 04/26/2021 1615   GLUCOSE 98 11/25/2014 2152   BUN 11 04/26/2021 1615   BUN 11 05/24/2020 1701   BUN 10 11/25/2014 2152   CREATININE 0.84 04/26/2021 1615   CREATININE 0.81 11/25/2014 2152   CALCIUM 10.1 04/26/2021 1615   CALCIUM 10.0 11/25/2014 2152   PROT 7.4 04/26/2021 1615   PROT 6.9 05/24/2020 1701   PROT 7.2 11/25/2014 2152   ALBUMIN 4.2 04/26/2021 1615   ALBUMIN 4.6 05/24/2020 1701   ALBUMIN 4.1 11/25/2014 2152   AST 19 04/26/2021 1615   AST 15 11/25/2014 2152   ALT 19 04/26/2021 1615   ALT 13 (L) 11/25/2014 2152   ALKPHOS 88 04/26/2021 1615   ALKPHOS 61 11/25/2014 2152   BILITOT 0.6 04/26/2021 1615   BILITOT 0.2 05/24/2020 1701   BILITOT 0.4 11/25/2014 2152   GFRNONAA >60 04/26/2021 1615   GFRNONAA >60 11/25/2014 2152   GFRAA 90 05/24/2020 1701   GFRAA >60 11/25/2014 2152       Component Value Date/Time   WBC 4.5 04/26/2021 1615   RBC 4.70 04/26/2021 1615   HGB 14.0 04/26/2021 1615   HGB 13.9 06/14/2020 1625   HCT 40.4 04/26/2021 1615   HCT 41.7 06/14/2020 1625   PLT 298 04/26/2021 1615   PLT 367 06/14/2020 1625   MCV 86.0 04/26/2021 1615   MCV 86 06/14/2020 1625   MCV 77 (L) 11/25/2014 2152   MCH 29.8 04/26/2021 1615   MCHC 34.7 04/26/2021 1615   RDW 13.5 04/26/2021 1615   RDW 13.2 06/14/2020 1625   RDW 22.4 (H) 11/25/2014 2152   LYMPHSABS 2.3 05/24/2020 1701   LYMPHSABS 2.4 03/27/2013 0500  MONOABS 0.4 07/30/2018 1033   MONOABS 0.4 03/27/2013 0500   EOSABS 0.1 05/24/2020 1701   EOSABS 0.2 03/27/2013 0500   BASOSABS 0.0 05/24/2020 1701   BASOSABS 0.0 03/27/2013 0500    No results found for: "POCLITH", "LITHIUM"   No results found for: "PHENYTOIN", "PHENOBARB", "VALPROATE", "CBMZ"   .res Assessment: Plan:    Yesena was seen today for follow-up, anxiety and post-traumatic stress disorder.  Diagnoses and all orders for this  visit:  Bipolar 1 disorder, mixed, moderate ([facility])  Generalized anxiety disorder  Panic disorder with agoraphobia  PTSD (post-traumatic stress disorder)  Chronic fatigue  Claustrophobia  B12 deficiency  Low vitamin D level   Greater than 50% of 30 minutes of non-face to face time with patient was spent on counseling and coordination of care. We discussed Camarie has a history of bipolar disorder with heavy seasonal component .  l she has addressed the work stress and it is much better at this time.  Vraylar helped her mood but she could not afford it.  We switched to Abilify 10 mg which she has been on for about several weeks and so far mood stability and depression are better.  She is not having any side effects.  We We discussed in depth the dosing range of Abilify and typically 15 mg is the usual mood stabilizing dose and she is below that at 10 mg.  However she had side effects at 20 mg which she took accidentally.  She previously had exacerbation of anxiety when she attempted to wean off sertraline but will try again going slower to maybe help weight loss.  Anxiety worse and racing thoughts options incr oxcarb, Abilify, sertraline or others.  She has tried all non-antipsychotic mood stabilizers except CBZ which is similar to oxcarbazapine that has been inadequate. Disc her fears about antipsychotics in detail and she agrees. Discussed potential metabolic side effects associated with atypical antipsychotics, as well as potential risk for movement side effects. Advised pt to contact office if movement side effects occur.   Continue Abilify 10 mg daily.  Option increase if needed call.. it helped a lot. Continue Abilify 10 mg daily, lamotrigine 200 More anxious with less so increase oxcarbazepine bcakc to 900 mg HS  sertraline 50 mg daily.  Consider increase for anxiety  Low B12 and vitamin D can contribute to psych problems and can be consequence of AEDs.  We discussed the  short-term risks associated with benzodiazepines including sedation and increased fall risk among others.  Discussed long-term side effect risk including dependence, potential withdrawal symptoms, and the potential eventual dose-related risk of dementia.  Disc risk dependence in detail in terms of the amount that can be used without it. Ativan less sedating than Xanax and helps. Prn panic  Asks for FMLA bc doctor visits and occ bad day of anxiety bc new supervisor is very strict.  FU 8 weeks  [name] Parents, MD, DFAPA    Please see After Visit Summary for patient specific instructions.  No future appointments.   No orders of the defined types were placed in this encounter.      -------------------------------

## 2022-08-17 ENCOUNTER — Other Ambulatory Visit: Payer: Self-pay | Admitting: Psychiatry

## 2022-08-17 DIAGNOSIS — F3162 Bipolar disorder, current episode mixed, moderate: Secondary | ICD-10-CM

## 2022-09-24 ENCOUNTER — Ambulatory Visit
Admission: EM | Admit: 2022-09-24 | Discharge: 2022-09-24 | Disposition: A | Discharge status: Home / Self Care | Payer: Managed Care, Other (non HMO) | Attending: Urgent Care | Admitting: Urgent Care

## 2022-09-24 ENCOUNTER — Encounter: Payer: Self-pay | Admitting: Emergency Medicine

## 2022-09-24 DIAGNOSIS — J0100 Acute maxillary sinusitis, unspecified: Secondary | ICD-10-CM | POA: Diagnosis not present

## 2022-09-24 DIAGNOSIS — R051 Acute cough: Secondary | ICD-10-CM

## 2022-09-24 MED ORDER — PREDNISONE 20 MG PO TABS: ORAL_TABLET | ORAL | 0 refills | Status: AC

## 2022-09-24 MED ORDER — BENZONATATE 100 MG PO CAPS: ORAL_CAPSULE | ORAL | 0 refills | Status: DC

## 2022-09-24 MED ORDER — AZITHROMYCIN 250 MG PO TABS: ORAL_TABLET | ORAL | 0 refills | Status: DC

## 2022-09-24 NOTE — ED Triage Notes (Signed)
Pt c/o cough, headache, nasal congestion x 2 days. Pt also has left ear pain x 1 week. She went to a walk-in clinic and was given abx for her ear but has not helped.

## 2022-09-24 NOTE — Discharge Instructions (Addendum)
Follow up here or with your primary care provider if your symptoms are worsening or not improving with treatment.     

## 2022-09-24 NOTE — ED Provider Notes (Signed)
Roderic Palau    CSN: [id number] Arrival date & time: 09/24/22  1251      History   Chief Complaint Chief Complaint  Patient presents with   Otalgia   Cough   Headache    HPI [name] STAMMEN is a 57 y.o. female.    Otalgia Associated symptoms: cough and headaches   Cough Associated symptoms: ear pain and headaches   Headache Associated symptoms: cough and ear pain     Patient presents to urgent care with symptoms greater than 1 week.  She states she was treated at a clinic a few days ago and was given an antibiotic for her left ear.  She is concerned because she continues to have symptoms of "stopped up" ear and only had medication for 3 days.  She states she was given amoxicillin/clavulanate (chart shows allergy to Augmentin).  She complains today of persistent cough which is not adequately treated with OTC medication, headache, nasal congestion and clogged left ear.  Past Medical History:  Diagnosis Date   Anxiety    Arthritis    B12 deficiency    Chicken pox    Depression    Fibroid    GERD (gastroesophageal reflux disease)    History of hiatal hernia    Hypertension    Iron deficiency anemia    Menorrhagia    Migraines    PAF (paroxysmal atrial fibrillation) ([place])    a. 04/2013 Echo: EF 55-60%, impaired relaxation. Mild MR/TR. Nl RV fxn; b. CHA2DS2VASc = 2; c. PRN flecainide.   Snores     Patient Active Problem List   Diagnosis Date Noted   Generalized abdominal pain 05/24/2020   Gastric polyp    Esophageal dysphagia    Polyp of sigmoid colon    Hypertrophy of anal papillae    Diverticulosis of large intestine without diverticulitis    HLD (hyperlipidemia) 03/12/2019   Vitamin D deficiency 03/12/2019   History of IBS 03/06/2019   Pure hypertriglyceridemia 02/27/2019   Menopause 02/27/2019   Uterus, adenomyosis 08/07/2018   S/P total hysterectomy and BSO (bilateral salpingo-oophorectomy) 08/05/2018   B12 deficiency 06/04/2018   PTSD  (post-traumatic stress disorder) 05/14/2018   HSV (herpes simplex virus) infection 11/27/2017   Anemia 03/08/2017   HNP (herniated nucleus pulposus), cervical 07/29/2015   Cervical radiculitis 04/22/2015   Bipolar I disorder, most recent episode depressed ([place]) 12/10/2014   GERD (gastroesophageal reflux disease) 12/10/2014   Migraines 12/10/2014   PAF (paroxysmal atrial fibrillation) ([place]) 04/10/2013    Past Surgical History:  Procedure Laterality Date   ABDOMINAL HYSTERECTOMY     APPENDECTOMY  5/11   C-spine surgery     CESAREAN SECTION  10/98,9/12   COLONOSCOPY WITH PROPOFOL N/A 03/26/2019   Procedure: COLONOSCOPY WITH PROPOFOL;  Surgeon: Virgel Manifold, MD;  Location: [facility] END[facility]COPY;  Service: Endoscopy;  Laterality: N/A;   ESOPHAGOGASTRODUODENOSCOPY (EGD) WITH PROPOFOL N/A 03/26/2019   Procedure: ESOPHAGOGASTRODUODENOSCOPY (EGD) WITH PROPOFOL;  Surgeon: Virgel Manifold, MD;  Location: [facility] ENDOSCOPY;  Service: Endoscopy;  Laterality: N/A;   HYSTERECTOMY ABDOMINAL WITH SALPINGO-OOPHORECTOMY Bilateral 08/05/2018   Procedure: HYSTERECTOMY ABDOMINAL WITH BILATERAL SALPINGO-OOPHORECTOMY;  Surgeon: Brayton Mars, MD;  Location: [facility] ORS;  Service: Gynecology;  Laterality: Bilateral;   TONSILLECTOMY  03/2008   TUBAL LIGATION      OB History     Gravida  5   Para  3   Term  2   Preterm  1   AB  2   Living  2      SAB      IAB  2   Ectopic      Multiple      Live Births  2        Obstetric Comments  1st Menstrual Cycle:  16 1st Pregnancy:  17          Home Medications    Prior to Admission medications   Medication Sig Start Date End Date Taking? Authorizing Provider  amLODipine (NORVASC) 5 MG tablet Take 1 tablet (5 mg total) by mouth daily. 01/24/22   Minna Merritts, MD  ARIPiprazole (ABILIFY) 10 MG tablet TAKE 1 TABLET(10 MG) BY MOUTH DAILY 08/17/22   Cottle, Billey Co., MD  cyclobenzaprine (FLEXERIL) 5 MG tablet Take 1 tablet (5 mg  total) by mouth 3 (three) times daily as needed. 01/18/21   Menshew, Dannielle Karvonen, PA-C  ibuprofen (ADVIL) 200 MG tablet Takes 1-3 tablets as needed    [provider]  lamoTRIgine (LAMICTAL) 200 MG tablet TAKE 1 TABLET BY MOUTH AT  BEDTIME 07/19/22   Cottle, Billey Co., MD  LORazepam (ATIVAN) 1 MG tablet TAKE 1 TABLET(1 MG) BY MOUTH EVERY 8 HOURS 06/28/22   Cottle, Billey Co., MD  Multiple Vitamin (MULTIVITAMIN WITH MINERALS) TABS tablet Take 1 tablet by mouth at bedtime.    [provider]  omeprazole (PRILOSEC) 40 MG capsule Take 1 capsule (40 mg total) by mouth at bedtime. 02/15/21   Lucilla Lame, MD  Oxcarbazepine (TRILEPTAL) 300 MG tablet TAKE 3 TABLETS BY MOUTH EVERY  NIGHT AT BEDTIME Patient taking differently: Take 450 mg by mouth daily. TAKE 3 TABLETS BY MOUTH EVERY  NIGHT AT BEDTIME 01/03/22   Cottle, Billey Co., MD  sertraline (ZOLOFT) 50 MG tablet TAKE 1 TABLET BY MOUTH DAILY 05/15/22   Cottle, Billey Co., MD    Family History Family History  Problem Relation Age of Onset   Diabetes Mother    Alcohol abuse Father    Arthritis Father    Heart disease Father    Arthritis Sister    Alcohol abuse Maternal Aunt    Alcohol abuse Maternal Uncle    Alcohol abuse Paternal Grandfather    Lung cancer Paternal Grandfather        smoker    Arthritis Maternal Grandmother    Schizophrenia Maternal Grandmother    Leukemia Maternal Grandfather    Breast cancer Neg Hx     Social History Social History   Tobacco Use   Smoking status: Former    Packs/day: 1.00    Years: 10.00    Total pack years: 10.00    Types: Cigarettes    Quit date: 08/22/1991    Years since quitting: 31.1   Smokeless tobacco: Never  Vaping Use   Vaping Use: Never used  Substance Use Topics   Alcohol use: Not Currently    Alcohol/week: 0.0 standard drinks of alcohol    Comment: twice a year    Drug use: Not Currently    Types: Marijuana    Comment: not for a couple years      Allergies   Phenylmercuric nitrate, Augmentin [amoxicillin-pot clavulanate], Codeine, Diclofenac, Latex, Neomycin, and Percocet [oxycodone-acetaminophen]   Review of Systems Review of Systems  HENT:  Positive for ear pain.   Respiratory:  Positive for cough.   Neurological:  Positive for headaches.     Physical Exam Triage Vital Signs ED Triage Vitals  Enc Vitals Group  BP 09/24/22 1332 124/85     Pulse Rate 09/24/22 1332 81     Resp 09/24/22 1332 16     Temp 09/24/22 1332 98.2 F (36.8 C)     Temp Source 09/24/22 1332 Oral     SpO2 09/24/22 1332 96 %     Weight --      Height --      Head Circumference --      Peak Flow --      Pain Score 09/24/22 1331 0     Pain Loc --      Pain Edu? --      Excl. in [place]? --    No data found.  Updated Vital Signs BP 124/85   Pulse 81   Temp 98.2 F (36.8 C) (Oral)   Resp 16   LMP  (LMP Unknown)   SpO2 96%   Visual Acuity Right Eye Distance:   Left Eye Distance:   Bilateral Distance:    Right Eye Near:   Left Eye Near:    Bilateral Near:     Physical Exam Vitals reviewed.  Constitutional:      Appearance: She is well-developed.  Cardiovascular:     Rate and Rhythm: Normal rate and regular rhythm.     Heart sounds: Normal heart sounds.  Pulmonary:     Effort: Pulmonary effort is normal.     Breath sounds: Normal breath sounds.  Skin:    General: Skin is warm and dry.  Neurological:     Mental Status: She is alert and oriented to person, place, and time.  Psychiatric:        Mood and Affect: Mood normal.        Behavior: Behavior normal.      UC Treatments / Results  Labs (all labs ordered are listed, but only abnormal results are displayed) Labs Reviewed - No data to display  EKG   Radiology No results found.  Procedures Procedures (including critical care time)  Medications Ordered in UC Medications - No data to display  Initial Impression / Assessment and Plan / UC Course  I have  reviewed the triage vital signs and the nursing notes.  Pertinent labs & imaging results that were available during my care of the patient were reviewed by me and considered in my medical decision making (see chart for details).   Patient is afebrile here without recent antipyretics. Satting well on room air. Overall is well appearing, well hydrated, without respiratory distress. Pulmonary exam is unremarkable.  Lungs CTAB without wheezing, rhonchi, rales.  TMs are nonerythematous bilaterally.  Unclear why the patient was treated with Augmentin given her reported allergy, treated for only 3 days, or if she actually had an ear infection.  I will treat her with azithromycin given her allergy and, after discussion, will add a course of prednisone to relieve what I think are sinus inflammation symptoms.    Final Clinical Impressions(s) / UC Diagnoses   Final diagnoses:  None   Discharge Instructions   None    ED Prescriptions   None    PDMP not reviewed this encounter.   Rose Phi, [place] 09/24/22 1356

## 2022-10-05 ENCOUNTER — Ambulatory Visit: Payer: 59 | Admitting: Psychiatry

## 2022-10-13 ENCOUNTER — Other Ambulatory Visit: Payer: Self-pay | Admitting: Psychiatry

## 2022-10-13 DIAGNOSIS — F40240 Claustrophobia: Secondary | ICD-10-CM

## 2022-10-13 DIAGNOSIS — F4001 Agoraphobia with panic disorder: Secondary | ICD-10-CM

## 2022-10-13 DIAGNOSIS — F4310 Post-traumatic stress disorder, unspecified: Secondary | ICD-10-CM

## 2022-10-13 DIAGNOSIS — F411 Generalized anxiety disorder: Secondary | ICD-10-CM

## 2022-10-19 ENCOUNTER — Telehealth: Payer: Self-pay | Admitting: Psychiatry

## 2022-10-19 NOTE — Telephone Encounter (Signed)
Received Health Care Provider Statement for Intermittent leave. Given to [facility] 2/29

## 2022-10-31 ENCOUNTER — Telehealth: Payer: Self-pay | Admitting: Psychiatry

## 2022-10-31 NOTE — Telephone Encounter (Signed)
Pt called checking on the status of Health care Provider Statement. It was received on 10/19/22. Pt said it is due by tomorrow. Please call her at [phone]

## 2022-10-31 NOTE — Telephone Encounter (Signed)
Paper work is completed and signed. Will fax per request.

## 2022-11-01 DIAGNOSIS — Z0289 Encounter for other administrative examinations: Secondary | ICD-10-CM

## 2022-11-01 NOTE — Telephone Encounter (Signed)
Paperwork faxed °

## 2022-11-14 ENCOUNTER — Other Ambulatory Visit: Payer: Self-pay

## 2022-11-14 DIAGNOSIS — F411 Generalized anxiety disorder: Secondary | ICD-10-CM

## 2022-11-14 DIAGNOSIS — F4001 Agoraphobia with panic disorder: Secondary | ICD-10-CM

## 2022-11-14 DIAGNOSIS — F40240 Claustrophobia: Secondary | ICD-10-CM

## 2022-11-14 DIAGNOSIS — F4310 Post-traumatic stress disorder, unspecified: Secondary | ICD-10-CM

## 2022-11-14 MED ORDER — SERTRALINE HCL 50 MG PO TABS: 50.0000 mg | ORAL_TABLET | Freq: Every day | ORAL | 0 refills | Status: DC

## 2022-11-16 ENCOUNTER — Encounter: Payer: Self-pay | Admitting: Psychiatry

## 2022-11-16 ENCOUNTER — Ambulatory Visit: Payer: 59 | Admitting: Psychiatry

## 2022-11-16 DIAGNOSIS — F3162 Bipolar disorder, current episode mixed, moderate: Secondary | ICD-10-CM

## 2022-11-16 DIAGNOSIS — F319 Bipolar disorder, unspecified: Secondary | ICD-10-CM

## 2022-11-16 DIAGNOSIS — F4310 Post-traumatic stress disorder, unspecified: Secondary | ICD-10-CM | POA: Diagnosis not present

## 2022-11-16 DIAGNOSIS — F4001 Agoraphobia with panic disorder: Secondary | ICD-10-CM | POA: Diagnosis not present

## 2022-11-16 DIAGNOSIS — F411 Generalized anxiety disorder: Secondary | ICD-10-CM

## 2022-11-16 DIAGNOSIS — R5382 Chronic fatigue, unspecified: Secondary | ICD-10-CM

## 2022-11-16 DIAGNOSIS — F40240 Claustrophobia: Secondary | ICD-10-CM

## 2022-11-16 MED ORDER — ARIPIPRAZOLE 10 MG PO TABS: ORAL_TABLET | ORAL | 1 refills | Status: DC

## 2022-11-16 MED ORDER — SERTRALINE HCL 50 MG PO TABS: 50.0000 mg | ORAL_TABLET | Freq: Every day | ORAL | 1 refills | Status: DC

## 2022-11-16 MED ORDER — OXCARBAZEPINE 300 MG PO TABS: ORAL_TABLET | ORAL | 3 refills | Status: DC

## 2022-11-16 NOTE — Progress Notes (Signed)
[name] [id number] 05/21/1966 57 y.o.    Subjective:   Patient ID:  [name] [name] is a 57 y.o. (DOB 1966/04/02) female.  Chief Complaint:  Chief Complaint  Patient presents with   Follow-up   Depression   Anxiety   Medication Reaction    Depression        Associated symptoms include decreased concentration and fatigue.  Associated symptoms include no headaches and no suicidal ideas.  Past medical history includes anxiety.   Anxiety Symptoms include decreased concentration. Patient reports no confusion, dizziness, nervous/anxious behavior, palpitations or suicidal ideas.     [name] [name] presents to the office today for follow-up of several dxes.  At visit  January 2020.  She had recently been diagnosed with low vitamin B12.  She was initiating treatment.  No meds were changed.  visit July 2020.  her anxiety was higher after reducing sertraline.  Therefore it was increased back to 100 mg daily.  06/2020 appt with the following noted: Cardiologist said she had chronic tx for afib and PVC's and it acts up more when she's under more stress.  Cardiologist suggested Bz when having the episodes.  Better at present but has periods of them daily.  They last an hour or more.  Rarely takes clonazepam, but it helps in 30 mins.  Tolerates clonazepam well otherwise. She felt increase sertraline helped some with anxiety.  It is better.  More irritable last couple of weeks than anxious unless heart episode.  Missed a couple of weeks of hormones which might have caused this. Panic attacks from 0-2 weekly is typical but episodes where she may have them daily for a couple of weeks associated with the palpitations as noted. Plan: DC clonazepam Start lorazepam 1 mg every 8 hours as needed anxiety attacks.  Call back if it is less effective than clonazepam or less well-tolerated.  04/06/20 appt with the following noted: Real stressed out and feels everything caving in on her.  Work, home  and everything.  D broke her leg and ex H had to stay with them. Supervisor stress and short-staffed.  Doesn't want the vaccine. Only one RX lorazepam.  It helped.  But afraid of addiction.  Tends to isolate weekends bc stress. Consistent with oxcarb 900 mg HS, sertraline 100, lamotrigine 200. Wanting to stay up late and then has EMA.   Not hyper, never.  Is irritable.   ExH's family gives her a hard time and drags D into it. Plan no med changes:  10/05/2020 appointment with the following noted: Some anger and anxiety but probably situational over the Covid situation and dealing with work situation.  Wakes that way a lot of times.  Irritable.  Walked out of work one day.  In the background all the time but will come out with triggers.  Controls it usually.  No panic. Plan: Increase Trileptal to 300 mg AM & 900 mg PM.   10/20/20 TC Rtc to patient and she reports feeling overwhelmed and stressed at work causing increased anxiety. She reports "going off" on her boss Friday. She reports there is someone at work going around telling everyone that patient is bipolar. [name]. She doesn't know how she would know. [name]. I asked if this was discussed at her last visit on 10/05/2020 and she said no. We discussed what she is thinking she needs, she really wants  to see what Dr. Clovis Pu thinks and get his opinion. She reports she thinks she needs 2 weeks off to get to feeling better and remove herself from work for right now. Then intermittent from that time. Informed her that is something he would probably do but I would discuss with him. OK'd  FMLA  11/23/20 TC: Message from staff: Addendum to previous messages. Next appt is 11/29/20 at 1:30. She has called back and said that she is having trouble at work with her co-workers. She wants to check into a hospital but she told me she didn't have any suicide thoughts. She  is under a lot of stress at work. Her best friend told someone at her employer that she has bipolar and co-workers are giving her a hard time because of the diagnosis. She said that she is still taking her meds. Her blood pressure when taken a couple of days ago was 184/101 and 148/88. She would like to be pointed in the right direction of what she needs to do? Her phone number is [phone]. She would like to speak to someone before her visit on Monday.   MD TC with pt:  Work stress with boss spreading info that she's bipolar.  Getting knit picked at work.  Did this to another friend who had bipolar disorder. Works [employer].  Talked to [place] yesterday.  Get a final warning writeup and feels it is unjust.  Working there since 2017 and all started in last 2 mos.    Was doing fine until this all started.  Feels discriminated against.  No SI.  Will see her Monday and start FMLA for a couple of weeks and we'll make med changes. She agrees to the plan.  Lynder Parents, MD, DFAPA  11/29/20 appt noted: Worked today.  Got new supervisor who's cleaning house.  She feels picked on.  This has made her mad and she has expressed anger at work and that's gotten her written up. I feel on edge all the time.  Also overwhelmed at home and problems with daughter as well.  Problems keeping things straight at home. More anger and irritability.   Patient denies difficulty with sleep initiation or maintenance. Denies appetite disturbance.  Patient reports that energy and motivation have been good.  She still has complaints of concentration and forgetfulness as previously noted.  Patient denies any suicidal ideation.  Still forgetful.   01/27/21 appt noted: Feels she's being mistreated at work bc she's bipolar.  Plans to get a new job.  Trying to get house ready to sale but D and son won't help with it.  Needed a leave from work.  Had panic attack last week thinking of going there.  Cried at work when there the last day. She is  more down as well as agitated and angry.  She has difficulty react really relaxing.  She easily panics.  She has difficulty concentrating.  She has feelings of hopelessness and helplessness.  No suicidal thoughts She has not tried the [employer] as suggested so far but agrees to do so. Plan: Start Vraylar 1.5 mg daily and if it works will wean other meds. Agree with medical leave until August 1   02/23/2021 appointment with the following noted: Disability approved until 02/28/21 but requested until August 1. Doesn't feel capable of RTW due to panic over thoughts of it.  Feels people are talking about her negatively at work.  Too anxious being around coworkers. Did not start Vraylar bc scared of  antipsychotics. Sx continue as noted above. HA and GI problems better not at work. Anxiety about work causes these sx and her BP to go up Plan: Start Vraylar 1.5 mg daily and if it works will wean other meds.   She agrees this time to take it.  Call if you change your mind or don't tolerate it. Agree with extending medical leave until August 8 due to depression, mood swings, anger outbursts, crying spells that interfere with ability to do her work and reduced concentration.   03/23/2021 phone call from patient:Patient lm to cancel appt on 8/5 stating if she was doing well per Dr Clovis Pu she didn't need it. She rescheduled for October next available. Patient will need a Rx for the Vraylar for samples were given at her last visit. 04/18/2021 phone call from patient:Pt left a message that the vraylar is to expensive and she will not be taking this medicine. Sent prescription for Abilify 10 mg in place of Vraylar  05/31/2021 appointment with the following noted: Insurance would not pay for [employer]. Abilify 10 mg worked pretty well for 5 and 1/2 weeks.  No SE now. Once accidentally took 20 mg and got dizzy with NV. Seen mood benefit with less depression and less irritable. Had argument with mother and threatened OD  and IVC spent 12 hours in hospital after OD of 5 mg lorazepam but wasn't trying to kill herself but was really mad at mother in a rage.  Doesn't get violent but mother triggers her. Boss now leaving her alone since her leave.  She's had no work performance problems.  She has talked to boss and it went well.  He's backed off micromanagment. No SE.  Eating less.  Happier. Plan: Continue Abilify 10 mg daily.  Option increase if needed call..  07/19/2021 phone call: Patient complaining that she felt shaky and restless on Abilify 10 mg for several weeks.  She was offered the option to reduce the dose.  08/11/2021 appointment with the following noted: For awhile had restless legs around knees but gone now. Family says she's doing better on it, not as irritable.  Not as on edge.   Still on Abilify 10 mg daily, lamotrigine 200, oxcarbazepine 900 mg HS, sertraline 100 mg daily. Not really depressed generally.  Sleep is ok but awakens early to go to bathroom.  At least 7 hours. Good sleeper.   No bouts SI since here.   Good work function. SE none unless weight gain. Plan: Continue Abilify 10 mg daily.  Option increase if needed call.. Continue Abilify 10 mg daily, lamotrigine 200, oxcarbazepine 900 mg HS,  Reduce sertraline 75 mg daily to see if can lose weight.   11/07/2021 appointment with the following noted: Reduced sertraline to 50 mg for 3 weeks. Doing ok.  Cut to 25 mg daily but too irritable.  Manageable on 50 mg daily. Anxiety about the same and OK with benefit with Ativan.  Life is stressful.   [place] with depression.  Calmer on Abilify.  But used to chaos and it seems bored.   Satisfied with meds. Tired all the time.  Sleep is good.  Alsways been able to sleep a lot. D is ADD and she questions Being ADD. IN: Continue Abilify 10 mg daily.  Option increase if needed call.. Continue Abilify 10 mg daily, lamotrigine 200, oxcarbazepine 900 mg HS,  Reduce sertraline 50 mg daily to see if can  lose weight. OK trial modafinil for poss ADD and chronic fatigue  100-200mg  am .  Disc [facility]  04/25/2022 appointment noted: Never got modafinil DT cost Fine with reduced sertraline. Anxiety is not worse and mood is better.  Don't let stuff bother me as much as I used to. Still has a lot of chronic anxiety. Feels better with Abilify.  Less obs thoughts and less irritable and not yelling.  07/27/22 appt noted; No problems with reduction oxcarb.  No SE change. Anxiety is a little worse and thinks it comes from work.  Not feel anxious in her mind but her body reacts with sweating and N.  Anx not worse in other situations.  Gets racing thoughts more now, repetitive.   Modafinil didn't help fatigue or alterness and caused SE insomnia. Otherwise sleeps well.  8 hr work days and 10 h weekends. M and son say she has better mood with Abilify. Plan: Continue Abilify 10 mg daily.  Option increase if needed call.. it helped a lot. Continue Abilify 10 mg daily, lamotrigine 200 More anxious with less so increase oxcarbazepine bcakc to 900 mg HS sertraline 50 mg daily.  Consider increase for anxiety  11/16/22 appt noted: Not much lorazepam Anxiety better with increase Trileptal 900 mg HS, Abilify 10, sertraline 50, lamotrigine 200,  A lot calmer with Abilify and mother notices.  No anger swings. Sleep pretty good No panic attacks. No sig SE.  Dizzy if forgets meds. Easy job but very stressful dealing with management  Was told she had low b12, still working on getting it up.  Getting shots.  hysterectomy for endometriosis and normally takes a patch distributing hormones though she is recently been out and it worsened her mood when she was not taking it..  Worried about hormones affecting mood.  Hx bad PMS.  Prior psychiatric medication trials include  sertraline,  Depakote,  lithium, lamotrigine, Trileptal 900, Saphris, risperidone, Vraylar 1.5 worked but $, Abilify 10 Clonazepam.  Xanax SE,  lorazepam 1. Modafinil NR and SE ins  F psych sx better with Abilify  Review of Systems:  Review of Systems  Constitutional:  Positive for fatigue. Negative for fever.  HENT:  Positive for hearing loss.   Cardiovascular:  Negative for palpitations.  Neurological:  Negative for dizziness, tremors and headaches.  Psychiatric/Behavioral:  Positive for decreased concentration. Negative for agitation, behavioral problems, confusion, dysphoric mood, hallucinations, self-injury, sleep disturbance and suicidal ideas. The patient is not nervous/anxious and is not hyperactive.     Medications: I have reviewed the patient's current medications.  Current Outpatient Medications  Medication Sig Dispense Refill   amLODipine (NORVASC) 5 MG tablet Take 1 tablet (5 mg total) by mouth daily. 180 tablet 3   benzonatate (TESSALON) 100 MG capsule Take 1-2 tablets 3 times a day as needed for cough 30 capsule 0   ibuprofen (ADVIL) 200 MG tablet Takes 1-3 tablets as needed     lamoTRIgine (LAMICTAL) 200 MG tablet TAKE 1 TABLET BY MOUTH AT  BEDTIME 90 tablet 3   LORazepam (ATIVAN) 1 MG tablet TAKE 1 TABLET(1 MG) BY MOUTH EVERY 8 HOURS 30 tablet 2   Multiple Vitamin (MULTIVITAMIN WITH MINERALS) TABS tablet Take 1 tablet by mouth at bedtime.     omeprazole (PRILOSEC) 40 MG capsule Take 1 capsule (40 mg total) by mouth at bedtime. 30 capsule 6   ARIPiprazole (ABILIFY) 10 MG tablet TAKE 1 TABLET(10 MG) BY MOUTH DAILY 90 tablet 1   cyclobenzaprine (FLEXERIL) 5 MG tablet Take 1 tablet (5 mg total) by mouth 3 (three) times  daily as needed. (Patient not taking: Reported on 11/16/2022) 15 tablet 0   Oxcarbazepine (TRILEPTAL) 300 MG tablet TAKE 3 TABLETS BY MOUTH EVERY  NIGHT AT BEDTIME 270 tablet 3   sertraline (ZOLOFT) 50 MG tablet Take 1 tablet (50 mg total) by mouth daily. 90 tablet 1   No current facility-administered medications for this visit.    Medication Side Effects: None  Allergies:  Allergies  Allergen  Reactions   Phenylmercuric Nitrate Rash   Augmentin [Amoxicillin-Pot Clavulanate] Nausea Only    Has patient had a PCN reaction causing immediate rash, facial/tongue/throat swelling, SOB or lightheadedness with hypotension: No Has patient had a PCN reaction causing severe rash involving mucus membranes or skin necrosis: No Has patient had a PCN reaction that required hospitalization: No Has patient had a PCN reaction occurring within the last 10 years: Yes If all of the above answers are "NO", then may proceed with Cephalosporin use.    Codeine Nausea Only   Diclofenac Nausea Only   Latex Rash   Neomycin Rash   Percocet [Oxycodone-Acetaminophen] Rash    Hives     Past Medical History:  Diagnosis Date   Anxiety    Arthritis    B12 deficiency    Chicken pox    Depression    Fibroid    GERD (gastroesophageal reflux disease)    History of hiatal hernia    Hypertension    Iron deficiency anemia    Menorrhagia    Migraines    PAF (paroxysmal atrial fibrillation) ([place])    a. 04/2013 Echo: EF 55-60%, impaired relaxation. Mild MR/TR. Nl RV fxn; b. CHA2DS2VASc = 2; c. PRN flecainide.   Snores     Family History  Problem Relation Age of Onset   Diabetes Mother    Alcohol abuse Father    Arthritis Father    Heart disease Father    Arthritis Sister    Alcohol abuse Maternal Aunt    Alcohol abuse Maternal Uncle    Alcohol abuse Paternal Grandfather    Lung cancer Paternal Grandfather        smoker    Arthritis Maternal Grandmother    Schizophrenia Maternal Grandmother    Leukemia Maternal Grandfather    Breast cancer Neg Hx     Social History   Socioeconomic History   Marital status: Legally Separated    Spouse name: Not on file   Number of children: Not on file   Years of education: Not on file   Highest education level: Not on file  Occupational History   Not on file  Tobacco Use   Smoking status: Former    Packs/day: 1.00    Years: 10.00    Additional pack  years: 0.00    Total pack years: 10.00    Types: Cigarettes    Quit date: 08/22/1991    Years since quitting: 31.2   Smokeless tobacco: Never  Vaping Use   Vaping Use: Never used  Substance and Sexual Activity   Alcohol use: Not Currently    Alcohol/week: 0.0 standard drinks of alcohol    Comment: twice a year    Drug use: Not Currently    Types: Marijuana    Comment: not for a couple years   Sexual activity: Yes    Partners: Male    Birth control/protection: Surgical    Comment: Husband  Other Topics Concern   Not on file  Social History Narrative   [employer]- [employer]    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  Lives with husband and 2 children    Son- 20 yo and daughter 99 yo    Pets: 2 dogs    Caffeine- 2 sodas 20 oz, coffee 1 cup occasionally    Enjoys being outside with family       DPR mom Horton Chin [phone]    Social Determinants of Health   Financial Resource Strain: Not on file  Food Insecurity: Not on file  Transportation Needs: Not on file  Physical Activity: Not on file  Stress: Not on file  Social Connections: Not on file  Intimate Partner Violence: Not on file    Past Medical History, Surgical history, Social history, and Family history were reviewed and updated as appropriate.   Please see review of systems for further details on the patient's review from today.   Objective:   Physical Exam:  LMP  (LMP Unknown)   Physical Exam Constitutional:      General: She is not in acute distress. Musculoskeletal:        General: No deformity.  Neurological:     Mental Status: She is alert and oriented to person, place, and time.     Cranial Nerves: No dysarthria.     Coordination: Coordination normal.  Psychiatric:        Attention and Perception: Attention and perception normal. She does not perceive auditory or visual hallucinations.        Mood and Affect: Mood is anxious. Mood is not depressed. Affect is not labile, angry or inappropriate.        Speech:  Speech normal.        Behavior: Behavior normal. Behavior is cooperative.        Thought Content: Thought content is not paranoid or delusional. Thought content does not include homicidal or suicidal ideation. Thought content does not include suicidal plan.        Cognition and Memory: Cognition and memory normal.        Judgment: Judgment normal.     Comments: Insight intact Her psychiatric symptoms are better now Affect positive Not irritable. Less anxious     Lab Review:     Component Value Date/Time   NA 139 04/26/2021 1615   NA 140 05/24/2020 1701   NA 138 11/25/2014 2152   K 3.8 04/26/2021 1615   K 3.7 11/25/2014 2152   CL 106 04/26/2021 1615   CL 105 11/25/2014 2152   CO2 25 04/26/2021 1615   CO2 28 11/25/2014 2152   GLUCOSE 96 04/26/2021 1615   GLUCOSE 98 11/25/2014 2152   BUN 11 04/26/2021 1615   BUN 11 05/24/2020 1701   BUN 10 11/25/2014 2152   CREATININE 0.84 04/26/2021 1615   CREATININE 0.81 11/25/2014 2152   CALCIUM 10.1 04/26/2021 1615   CALCIUM 10.0 11/25/2014 2152   PROT 7.4 04/26/2021 1615   PROT 6.9 05/24/2020 1701   PROT 7.2 11/25/2014 2152   ALBUMIN 4.2 04/26/2021 1615   ALBUMIN 4.6 05/24/2020 1701   ALBUMIN 4.1 11/25/2014 2152   AST 19 04/26/2021 1615   AST 15 11/25/2014 2152   ALT 19 04/26/2021 1615   ALT 13 (L) 11/25/2014 2152   ALKPHOS 88 04/26/2021 1615   ALKPHOS 61 11/25/2014 2152   BILITOT 0.6 04/26/2021 1615   BILITOT 0.2 05/24/2020 1701   BILITOT 0.4 11/25/2014 2152   GFRNONAA >60 04/26/2021 1615   GFRNONAA >60 11/25/2014 2152   GFRAA 90 05/24/2020 1701   GFRAA >60 11/25/2014 2152  Component Value Date/Time   WBC 4.5 04/26/2021 1615   RBC 4.70 04/26/2021 1615   HGB 14.0 04/26/2021 1615   HGB 13.9 06/14/2020 1625   HCT 40.4 04/26/2021 1615   HCT 41.7 06/14/2020 1625   PLT 298 04/26/2021 1615   PLT 367 06/14/2020 1625   MCV 86.0 04/26/2021 1615   MCV 86 06/14/2020 1625   MCV 77 (L) 11/25/2014 2152   MCH 29.8  04/26/2021 1615   MCHC 34.7 04/26/2021 1615   RDW 13.5 04/26/2021 1615   RDW 13.2 06/14/2020 1625   RDW 22.4 (H) 11/25/2014 2152   LYMPHSABS 2.3 05/24/2020 1701   LYMPHSABS 2.4 03/27/2013 0500   MONOABS 0.4 07/30/2018 1033   MONOABS 0.4 03/27/2013 0500   EOSABS 0.1 05/24/2020 1701   EOSABS 0.2 03/27/2013 0500   BASOSABS 0.0 05/24/2020 1701   BASOSABS 0.0 03/27/2013 0500    No results found for: "POCLITH", "LITHIUM"   No results found for: "PHENYTOIN", "PHENOBARB", "VALPROATE", "CBMZ"   .res Assessment: Plan:    Darshell was seen today for follow-up, depression, anxiety and medication reaction.  Diagnoses and all orders for this visit:  Bipolar 1 disorder, mixed, moderate (HCC) -     ARIPiprazole (ABILIFY) 10 MG tablet; TAKE 1 TABLET(10 MG) BY MOUTH DAILY  Generalized anxiety disorder -     sertraline (ZOLOFT) 50 MG tablet; Take 1 tablet (50 mg total) by mouth daily.  Panic disorder with agoraphobia -     sertraline (ZOLOFT) 50 MG tablet; Take 1 tablet (50 mg total) by mouth daily.  PTSD (post-traumatic stress disorder) -     sertraline (ZOLOFT) 50 MG tablet; Take 1 tablet (50 mg total) by mouth daily.  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   Greater than 50% of 30 minutes of non-face to face time with patient was spent on counseling and coordination of care. We discussed Jeziah has a history of bipolar disorder with heavy seasonal component .  l she has addressed the work stress and it is much better at this time.  Vraylar helped her mood but she could not afford it.  We switched to Abilify 10 mg which she has been on for about several weeks and so far mood stability and depression are better.  She is not having any side effects.  We We discussed in  depth the dosing range of Abilify and typically 15 mg is the usual mood stabilizing dose and she is below that at 10 mg.  However she had side effects at 20 mg which she took accidentally.  She previously had exacerbation of anxiety when she attempted to wean off sertraline but will try again going slower to maybe help weight loss.  Anxiety and racing thoughts options incr oxcarb, Abilify, sertraline or others better.  She has tried all non-antipsychotic mood stabilizers except CBZ which is similar to oxcarbazapine that has been inadequate. Disc her fears about antipsychotics in detail and she agrees. Discussed potential metabolic side effects associated with atypical antipsychotics, as well as potential risk for movement side effects. Advised pt to contact office if movement side effects occur.   Continue Abilify 10 mg daily.  Option increase if needed call.. it helped a lot. Continue Abilify 10 mg daily,  lamotrigine 200 Better anxious  oxcarbazepine bcakc to 900 mg HS  sertraline 50 mg daily.  Consider increase for anxiety  Low B12 and vitamin D can contribute to psych problems and can be consequence of AEDs.  We discussed the short-term risks associated with benzodiazepines including sedation and increased fall risk among others.  Discussed long-term side effect risk including dependence, potential withdrawal symptoms, and the potential eventual dose-related risk of dementia.  Disc risk dependence in detail in terms of the amount that can be used without it. Ativan less sedating than Xanax and helps. Prn panic  needs FMLA bc doctor visits and occ bad day of anxiety bc new supervisor is very strict.  FU 8 weeks  Lynder Parents, MD, DFAPA    Please see After Visit Summary for patient specific instructions.  Future Appointments  Date Time Provider [place]  11/28/2022  3:30 PM Lucilla Lame, MD [facility] None     No orders of the defined types were placed in this  encounter.      -------------------------------

## 2022-11-28 ENCOUNTER — Encounter: Payer: Self-pay | Admitting: Gastroenterology

## 2022-11-28 ENCOUNTER — Ambulatory Visit: Payer: Managed Care, Other (non HMO) | Admitting: Gastroenterology

## 2022-11-28 VITALS — BP 151/88 | HR 76 | Temp 98.0°F | Wt 205.0 lb

## 2022-11-28 DIAGNOSIS — R1319 Other dysphagia: Secondary | ICD-10-CM

## 2022-11-28 DIAGNOSIS — K219 Gastro-esophageal reflux disease without esophagitis: Secondary | ICD-10-CM | POA: Diagnosis not present

## 2022-11-28 MED ORDER — PANTOPRAZOLE SODIUM 40 MG PO TBEC: 40.0000 mg | DELAYED_RELEASE_TABLET | Freq: Every day | ORAL | 5 refills | Status: DC

## 2022-11-28 NOTE — Progress Notes (Signed)
Primary Care Physician: Pcp, No  Primary Gastroenterologist:  Dr. Midge Minium  Chief Complaint  Patient presents with   "Sqeezing" in Esophagus    HPI: [name] [name] is a 57 y.o. female here who has a history of irritable bowel syndrome and GERD.  The patient has had an EGD and colonoscopy in the past by one of my partners back in 2020.  The patient was recommended to have a repeat colonoscopy in 5 years due to colon polyps.  The patient was recently reported to have squeezing in her esophagus and made an appointment to see me today.  The patient reports that she is on amlodipine but does not take it on a regular basis.  She also reports that the symptoms can come at any time and she has been on omeprazole.  She states that the intestinal spasms she has from her irritable bowel syndrome has been treated in the past with dicyclomine without any relief from that.  She reports that that is not her main concern at the present time and that her esophageal problems always concerning her.  Past Medical History:  Diagnosis Date   Anxiety    Arthritis    B12 deficiency    Chicken pox    Depression    Fibroid    GERD (gastroesophageal reflux disease)    History of hiatal hernia    Hypertension    Iron deficiency anemia    Menorrhagia    Migraines    PAF (paroxysmal atrial fibrillation)    a. 04/2013 Echo: EF 55-60%, impaired relaxation. Mild MR/TR. Nl RV fxn; b. CHA2DS2VASc = 2; c. PRN flecainide.   Snores     Current Outpatient Medications  Medication Sig Dispense Refill   amLODipine (NORVASC) 5 MG tablet Take 1 tablet (5 mg total) by mouth daily. 180 tablet 3   ARIPiprazole (ABILIFY) 10 MG tablet TAKE 1 TABLET(10 MG) BY MOUTH DAILY 90 tablet 1   benzonatate (TESSALON) 100 MG capsule Take 1-2 tablets 3 times a day as needed for cough 30 capsule 0   cyclobenzaprine (FLEXERIL) 5 MG tablet Take 1 tablet (5 mg total) by mouth 3 (three) times daily as needed. 15 tablet 0   ibuprofen  (ADVIL) 200 MG tablet Takes 1-3 tablets as needed     lamoTRIgine (LAMICTAL) 200 MG tablet TAKE 1 TABLET BY MOUTH AT  BEDTIME 90 tablet 3   LORazepam (ATIVAN) 1 MG tablet TAKE 1 TABLET(1 MG) BY MOUTH EVERY 8 HOURS 30 tablet 2   Multiple Vitamin (MULTIVITAMIN WITH MINERALS) TABS tablet Take 1 tablet by mouth at bedtime.     omeprazole (PRILOSEC) 40 MG capsule Take 1 capsule (40 mg total) by mouth at bedtime. 30 capsule 6   Oxcarbazepine (TRILEPTAL) 300 MG tablet TAKE 3 TABLETS BY MOUTH EVERY  NIGHT AT BEDTIME 270 tablet 3   sertraline (ZOLOFT) 50 MG tablet Take 1 tablet (50 mg total) by mouth daily. 90 tablet 1   No current facility-administered medications for this visit.    Allergies as of 11/28/2022 - Review Complete 11/28/2022  Allergen Reaction Noted   Phenylmercuric nitrate Rash 07/02/2014   Augmentin [amoxicillin-pot clavulanate] Nausea Only 02/10/2013   Codeine Nausea Only 02/10/2013   Diclofenac Nausea Only    Latex Rash 02/10/2013   Neomycin Rash 06/26/2015   Percocet [oxycodone-acetaminophen] Rash 02/10/2013    ROS:  General: Negative for anorexia, weight loss, fever, chills, fatigue, weakness. ENT: Negative for hoarseness, difficulty swallowing , nasal congestion. CV:  Negative for chest pain, angina, palpitations, dyspnea on exertion, peripheral edema.  Respiratory: Negative for dyspnea at rest, dyspnea on exertion, cough, sputum, wheezing.  GI: See history of present illness. GU:  Negative for dysuria, hematuria, urinary incontinence, urinary frequency, nocturnal urination.  Endo: Negative for unusual weight change.    Physical Examination:   LMP  (LMP Unknown)   General: Well-nourished, well-developed in no acute distress.  Eyes: No icterus. Conjunctivae pink. Neuro: Alert and oriented x 3.  Grossly intact. Skin: Warm and dry, no jaundice.   Psych: Alert and cooperative, normal mood and affect.  Labs:    Imaging Studies: No results found.  Assessment and  Plan:   [name] [name] is a 56 y.o. y/o female who comes in today with what sounds like esophageal spasms.  The patient is already on a calcium channel blocker although she states that she does not take it regularly.  The patient will be switched to pantoprazole and has been told to start taking the amlodipine.  If the symptoms do not get better I have told the patient that we will likely need to be set up for esophageal manometry.  The patient has been explained the plan agrees with it.     Midge Minium, MD. Clementeen Graham    Note: This dictation was prepared with Dragon dictation along with smaller phrase technology. Any transcriptional errors that result from this process are unintentional.

## 2023-02-23 ENCOUNTER — Other Ambulatory Visit: Payer: Self-pay | Admitting: Psychiatry

## 2023-02-23 DIAGNOSIS — F3162 Bipolar disorder, current episode mixed, moderate: Secondary | ICD-10-CM

## 2023-03-20 ENCOUNTER — Ambulatory Visit (INDEPENDENT_AMBULATORY_CARE_PROVIDER_SITE_OTHER): Payer: 59 | Admitting: Psychiatry

## 2023-04-04 ENCOUNTER — Other Ambulatory Visit: Payer: Self-pay | Admitting: Psychiatry

## 2023-04-04 DIAGNOSIS — F3162 Bipolar disorder, current episode mixed, moderate: Secondary | ICD-10-CM

## 2023-04-09 ENCOUNTER — Telehealth: Payer: Self-pay | Admitting: Psychiatry

## 2023-04-09 DIAGNOSIS — Z0289 Encounter for other administrative examinations: Secondary | ICD-10-CM

## 2023-04-09 NOTE — Telephone Encounter (Signed)
Received FMLA forms for pt from [employer]. Paid and given to Traci to complete

## 2023-04-13 NOTE — Telephone Encounter (Signed)
Forms completed and will have Dr Jennelle Human sign

## 2023-04-25 ENCOUNTER — Telehealth: Payer: Self-pay | Admitting: Psychiatry

## 2023-04-25 NOTE — Telephone Encounter (Signed)
Received fax today needing to update on portion of the FMLA form.  Given to [facility] for update.

## 2023-04-26 ENCOUNTER — Other Ambulatory Visit: Payer: Self-pay | Admitting: Psychiatry

## 2023-04-26 DIAGNOSIS — F4001 Agoraphobia with panic disorder: Secondary | ICD-10-CM

## 2023-04-26 NOTE — Telephone Encounter (Signed)
  LOA dates are 03/22/2023-09/22/2023 per previous forms faxed on 04/13/2023 written in by patient, as those dates. Now receiving paper work from [employer] that dates requested from patient have changed to 03/19/2023-09/17/2023.  Will update with Dr. Jennelle Human and get a signature to fax back.

## 2023-04-27 NOTE — Telephone Encounter (Signed)
Update on her Intermittent loa dates changed to 03/19/2023-09/17/2023. Paperwork faxed

## 2023-04-27 NOTE — Telephone Encounter (Signed)
Paperwork faxed °

## 2023-05-22 ENCOUNTER — Telehealth: Payer: 59 | Admitting: Psychiatry

## 2023-05-22 ENCOUNTER — Encounter: Payer: Self-pay | Admitting: Psychiatry

## 2023-05-22 DIAGNOSIS — F319 Bipolar disorder, unspecified: Secondary | ICD-10-CM

## 2023-05-22 DIAGNOSIS — F4310 Post-traumatic stress disorder, unspecified: Secondary | ICD-10-CM

## 2023-05-22 DIAGNOSIS — F4001 Agoraphobia with panic disorder: Secondary | ICD-10-CM

## 2023-05-22 DIAGNOSIS — F3162 Bipolar disorder, current episode mixed, moderate: Secondary | ICD-10-CM

## 2023-05-22 DIAGNOSIS — F411 Generalized anxiety disorder: Secondary | ICD-10-CM | POA: Diagnosis not present

## 2023-05-22 DIAGNOSIS — F40240 Claustrophobia: Secondary | ICD-10-CM

## 2023-05-22 MED ORDER — LORAZEPAM 1 MG PO TABS: 1.0000 mg | ORAL_TABLET | Freq: Three times a day (TID) | ORAL | 2 refills | Status: DC | PRN

## 2023-05-22 MED ORDER — LAMOTRIGINE 200 MG PO TABS: 200.0000 mg | ORAL_TABLET | Freq: Every day | ORAL | 1 refills | Status: DC

## 2023-05-22 MED ORDER — PANTOPRAZOLE SODIUM 40 MG PO TBEC: 40.0000 mg | DELAYED_RELEASE_TABLET | Freq: Every day | ORAL | 5 refills | Status: DC

## 2023-05-22 MED ORDER — SERTRALINE HCL 50 MG PO TABS: 50.0000 mg | ORAL_TABLET | Freq: Every day | ORAL | 1 refills | Status: DC

## 2023-05-22 NOTE — Progress Notes (Addendum)
[name] [name] [id number] 12-Apr-1966 57 y.o.   Video Visit via My Chart  I connected with pt by video using My Chart and verified that I am speaking with the correct person using two identifiers.   I discussed the limitations, risks, security and privacy concerns of performing an evaluation and management service by My Chart  and the availability of in person appointments. I also discussed with the patient that there may be a patient responsible charge related to this service. The patient expressed understanding and agreed to proceed.  I discussed the assessment and treatment plan with the patient. The patient was provided an opportunity to ask questions and all were answered. The patient agreed with the plan and demonstrated an understanding of the instructions.   The patient was advised to call back or seek an in-person evaluation if the symptoms worsen or if the condition fails to improve as anticipated.  I provided 30 minutes of video time during this encounter.  The patient was located at home and the provider was located office. Session 400pm-430PM  Subjective:   Patient ID:  [name] [name] is a 57 y.o. (DOB 1965-12-13) female.  Chief Complaint:  Chief Complaint  Patient presents with   Follow-up   Depression   Anxiety   Stress    Depression        Associated symptoms include fatigue.  Associated symptoms include no decreased concentration, no headaches and no suicidal ideas.  Past medical history includes anxiety.   Anxiety Patient reports no confusion, decreased concentration, dizziness, nervous/anxious behavior, palpitations or suicidal ideas.     [name] [name] presents to the office today for follow-up of several dxes.  At visit  January 2020.  She had recently been diagnosed with low vitamin B12.  She was initiating treatment.  No meds were changed.  visit July 2020.  her anxiety was higher after reducing sertraline.  Therefore it was increased back to 100 mg  daily.  06/2020 appt with the following noted: Cardiologist said she had chronic tx for afib and PVC's and it acts up more when she's under more stress.  Cardiologist suggested Bz when having the episodes.  Better at present but has periods of them daily.  They last an hour or more.  Rarely takes clonazepam, but it helps in 30 mins.  Tolerates clonazepam well otherwise. She felt increase sertraline helped some with anxiety.  It is better.  More irritable last couple of weeks than anxious unless heart episode.  Missed a couple of weeks of hormones which might have caused this. Panic attacks from 0-2 weekly is typical but episodes where she may have them daily for a couple of weeks associated with the palpitations as noted. Plan: DC clonazepam Start lorazepam 1 mg every 8 hours as needed anxiety attacks.  Call back if it is less effective than clonazepam or less well-tolerated.  04/06/20 appt with the following noted: Real stressed out and feels everything caving in on her.  Work, home and everything.  D broke her leg and ex H had to stay with them. Supervisor stress and short-staffed.  Doesn't want the vaccine. Only one RX lorazepam.  It helped.  But afraid of addiction.  Tends to isolate weekends bc stress. Consistent with oxcarb 900 mg HS, sertraline 100, lamotrigine 200. Wanting to stay up late and then has EMA.   Not hyper, never.  Is irritable.   ExH's family gives her a hard time and drags D into it. Plan no  med changes:  10/05/2020 appointment with the following noted: Some anger and anxiety but probably situational over the Covid situation and dealing with work situation.  Wakes that way a lot of times.  Irritable.  Walked out of work one day.  In the background all the time but will come out with triggers.  Controls it usually.  No panic. Plan: Increase Trileptal to 300 mg AM & 900 mg PM.   10/20/20 TC Rtc to patient and she reports feeling overwhelmed and stressed at work causing increased  anxiety. She reports "going off" on her boss Friday. She reports there is someone at work going around telling everyone that patient is bipolar. [name]. She doesn't know how she would know. [name]. I asked if this was discussed at her last visit on 10/05/2020 and she said no. We discussed what she is thinking she needs, she really wants to see what Dr. Jennelle Human thinks and get his opinion. She reports she thinks she needs 2 weeks off to get to feeling better and remove herself from work for right now. Then intermittent from that time. Informed her that is something he would probably do but I would discuss with him. OK'd  FMLA  11/23/20 TC: Message from staff: Addendum to previous messages. Next appt is 11/29/20 at 1:30. She has called back and said that she is having trouble at work with her co-workers. She wants to check into a hospital but she told me she didn't have any suicide thoughts. She is under a lot of stress at work. Her best friend told someone at her employer that she has bipolar and co-workers are giving her a hard time because of the diagnosis. She said that she is still taking her meds. Her blood pressure when taken a couple of days ago was 184/101 and 148/88. She would like to be pointed in the right direction of what she needs to do? Her phone number is [phone]. She would like to speak to someone before her visit on Monday.   MD TC with pt:  Work stress with boss spreading info that she's bipolar.  Getting knit picked at work.  Did this to another friend who had bipolar disorder. Works [employer].  Talked to HR yesterday.  Get a final warning writeup and feels it is unjust.  Working there since 2017 and all started in last 2 mos.    Was doing fine until this all started.  Feels discriminated against.  No SI.  Will see her Monday and start FMLA for a couple of weeks and we'll make med  changes. She agrees to the plan.  [name], MD, DFAPA  11/29/20 appt noted: Worked today.  Got new supervisor who's cleaning house.  She feels picked on.  This has made her mad and she has expressed anger at work and that's gotten her written up. I feel on edge all the time.  Also overwhelmed at home and problems with daughter as well.  Problems keeping things straight at home. More anger and irritability.   Patient denies difficulty with sleep initiation or maintenance. Denies appetite disturbance.  Patient reports that energy and motivation have been good.  She still has complaints of concentration and forgetfulness as previously noted.  Patient denies any suicidal ideation.  Still forgetful.   01/27/21 appt noted: Feels she's being mistreated at work bc she's bipolar.  Plans to get  a new job.  Trying to get house ready to sale but D and son won't help with it.  Needed a leave from work.  Had panic attack last week thinking of going there.  Cried at work when there the last day. She is more down as well as agitated and angry.  She has difficulty react really relaxing.  She easily panics.  She has difficulty concentrating.  She has feelings of hopelessness and helplessness.  No suicidal thoughts She has not tried the [employer] as suggested so far but agrees to do so. Plan: Start Vraylar 1.5 mg daily and if it works will wean other meds. Agree with medical leave until August 1   02/23/2021 appointment with the following noted: Disability approved until 02/28/21 but requested until August 1. Doesn't feel capable of RTW due to panic over thoughts of it.  Feels people are talking about her negatively at work.  Too anxious being around coworkers. Did not start Vraylar bc scared of antipsychotics. Sx continue as noted above. HA and GI problems better not at work. Anxiety about work causes these sx and her BP to go up Plan: Start Vraylar 1.5 mg daily and if it works will wean other meds.   She agrees  this time to take it.  Call if you change your mind or don't tolerate it. Agree with extending medical leave until August 8 due to depression, mood swings, anger outbursts, crying spells that interfere with ability to do her work and reduced concentration.   03/23/2021 phone call from patient:Patient lm to cancel appt on 8/5 stating if she was doing well per Dr Jennelle Human she didn't need it. She rescheduled for October next available. Patient will need a Rx for the Vraylar for samples were given at her last visit. 04/18/2021 phone call from patient:Pt left a message that the vraylar is to expensive and she will not be taking this medicine. Sent prescription for Abilify 10 mg in place of Vraylar  05/31/2021 appointment with the following noted: Insurance would not pay for [employer]. Abilify 10 mg worked pretty well for 5 and 1/2 weeks.  No SE now. Once accidentally took 20 mg and got dizzy with NV. Seen mood benefit with less depression and less irritable. Had argument with mother and threatened OD and IVC spent 12 hours in hospital after OD of 5 mg lorazepam but wasn't trying to kill herself but was really mad at mother in a rage.  Doesn't get violent but mother triggers her. Boss now leaving her alone since her leave.  She's had no work performance problems.  She has talked to boss and it went well.  He's backed off micromanagment. No SE.  Eating less.  Happier. Plan: Continue Abilify 10 mg daily.  Option increase if needed call..  07/19/2021 phone call: Patient complaining that she felt shaky and restless on Abilify 10 mg for several weeks.  She was offered the option to reduce the dose.  08/11/2021 appointment with the following noted: For awhile had restless legs around knees but gone now. Family says she's doing better on it, not as irritable.  Not as on edge.   Still on Abilify 10 mg daily, lamotrigine 200, oxcarbazepine 900 mg HS, sertraline 100 mg daily. Not really depressed generally.  Sleep  is ok but awakens early to go to bathroom.  At least 7 hours. Good sleeper.   No bouts SI since here.   Good work function. SE none unless weight gain. Plan: Continue Abilify  10 mg daily.  Option increase if needed call.. Continue Abilify 10 mg daily, lamotrigine 200, oxcarbazepine 900 mg HS,  Reduce sertraline 75 mg daily to see if can lose weight.   11/07/2021 appointment with the following noted: Reduced sertraline to 50 mg for 3 weeks. Doing ok.  Cut to 25 mg daily but too irritable.  Manageable on 50 mg daily. Anxiety about the same and OK with benefit with Ativan.  Life is stressful.   Ok with depression.  Calmer on Abilify.  But used to chaos and it seems bored.   Satisfied with meds. Tired all the time.  Sleep is good.  Alsways been able to sleep a lot. D is ADD and she questions Being ADD. IN: Continue Abilify 10 mg daily.  Option increase if needed call.. Continue Abilify 10 mg daily, lamotrigine 200, oxcarbazepine 900 mg HS,  Reduce sertraline 50 mg daily to see if can lose weight. OK trial modafinil for poss ADD and chronic fatigue 100-200mg  am .  Disc [facility]  04/25/2022 appointment noted: Never got modafinil DT cost Fine with reduced sertraline. Anxiety is not worse and mood is better.  Don't let stuff bother me as much as I used to. Still has a lot of chronic anxiety. Feels better with Abilify.  Less obs thoughts and less irritable and not yelling.  07/27/22 appt noted; No problems with reduction oxcarb.  No SE change. Anxiety is a little worse and thinks it comes from work.  Not feel anxious in her mind but her body reacts with sweating and N.  Anx not worse in other situations.  Gets racing thoughts more now, repetitive.   Modafinil didn't help fatigue or alterness and caused SE insomnia. Otherwise sleeps well.  8 hr work days and 10 h weekends. M and son say she has better mood with Abilify. Plan: Continue Abilify 10 mg daily.  Option increase if needed call.. it  helped a lot. Continue Abilify 10 mg daily, lamotrigine 200 More anxious with less so increase oxcarbazepine bcakc to 900 mg HS sertraline 50 mg daily.  Consider increase for anxiety  11/16/22 appt noted: Not much lorazepam Anxiety better with increase Trileptal 900 mg HS, Abilify 10, sertraline 50, lamotrigine 200,  A lot calmer with Abilify and mother notices.  No anger swings. Sleep pretty good No panic attacks. No sig SE.  Dizzy if forgets meds. Easy job but very stressful dealing with management Was told she had low b12, still working on getting it up.  Getting shots. Plan: Continue Abilify 10 mg daily, lamotrigine 200 Better anxious  oxcarbazepine back to 900 mg HS sertraline 50 mg daily.  Consider increase for anxiety  05/22/23 appt noted: Meds as above:  lorazepam 1 mg prn stress.  Others No sig SE unless Abilify wt gain .  Gained 15#/3 mos Likes the benefit from Abilify. Pretty good mood overall.  No panic.  Normal mood . "Calmest I've been in a long time" Asks me to refill Protonix which works bc hard to get into her doctorl.   Pleased with meds.  hysterectomy for endometriosis and normally takes a patch distributing hormones though she is recently been out and it worsened her mood when she was not taking it..  Worried about hormones affecting mood.  Hx bad PMS.  Prior psychiatric medication trials include  sertraline,  Depakote,  lithium, lamotrigine, Trileptal 900, Saphris, risperidone, Vraylar 1.5 worked but $, Abilify 10 Clonazepam.  Xanax SE, lorazepam 1. Modafinil NR and SE  ins  F psych sx better with Abilify  Review of Systems:  Review of Systems  Constitutional:  Positive for fatigue. Negative for fever.  HENT:  Positive for hearing loss.   Cardiovascular:  Negative for palpitations.  Neurological:  Negative for dizziness, tremors and headaches.  Psychiatric/Behavioral:  Positive for depression. Negative for agitation, behavioral problems, confusion,  decreased concentration, dysphoric mood, hallucinations, self-injury, sleep disturbance and suicidal ideas. The patient is not nervous/anxious and is not hyperactive.     Medications: I have reviewed the patient's current medications.  Current Outpatient Medications  Medication Sig Dispense Refill   amLODipine (NORVASC) 5 MG tablet Take 1 tablet (5 mg total) by mouth daily. 180 tablet 3   ARIPiprazole (ABILIFY) 10 MG tablet TAKE 1 TABLET BY MOUTH DAILY 90 tablet 3   benzonatate (TESSALON) 100 MG capsule Take 1-2 tablets 3 times a day as needed for cough 30 capsule 0   cyclobenzaprine (FLEXERIL) 5 MG tablet Take 1 tablet (5 mg total) by mouth 3 (three) times daily as needed. 15 tablet 0   ibuprofen (ADVIL) 200 MG tablet Takes 1-3 tablets as needed     Multiple Vitamin (MULTIVITAMIN WITH MINERALS) TABS tablet Take 1 tablet by mouth at bedtime.     Oxcarbazepine (TRILEPTAL) 300 MG tablet TAKE 3 TABLETS BY MOUTH EVERY  NIGHT AT BEDTIME 270 tablet 3   lamoTRIgine (LAMICTAL) 200 MG tablet TAKE 1 TABLET BY MOUTH AT  BEDTIME 90 tablet 0   LORazepam (ATIVAN) 1 MG tablet Take 1 tablet (1 mg total) by mouth every 8 (eight) hours as needed for anxiety. 30 tablet 2   pantoprazole (PROTONIX) 40 MG tablet Take 1 tablet (40 mg total) by mouth daily. 30 tablet 5   sertraline (ZOLOFT) 50 MG tablet TAKE 1 TABLET BY MOUTH DAILY 90 tablet 2   No current facility-administered medications for this visit.    Medication Side Effects: None  Allergies:  Allergies  Allergen Reactions   Phenylmercuric Nitrate Rash   Augmentin [Amoxicillin-Pot Clavulanate] Nausea Only    Has patient had a PCN reaction causing immediate rash, facial/tongue/throat swelling, SOB or lightheadedness with hypotension: No Has patient had a PCN reaction causing severe rash involving mucus membranes or skin necrosis: No Has patient had a PCN reaction that required hospitalization: No Has patient had a PCN reaction occurring within the last  10 years: Yes If all of the above answers are "NO", then may proceed with Cephalosporin use.    Codeine Nausea Only   Diclofenac Nausea Only   Latex Rash   Neomycin Rash   Percocet [Oxycodone-Acetaminophen] Rash    Hives     Past Medical History:  Diagnosis Date   Anxiety    Arthritis    B12 deficiency    Chicken pox    Depression    Fibroid    GERD (gastroesophageal reflux disease)    History of hiatal hernia    Hypertension    Iron deficiency anemia    Menorrhagia    Migraines    PAF (paroxysmal atrial fibrillation) (HCC)    a. 04/2013 Echo: EF 55-60%, impaired relaxation. Mild MR/TR. Nl RV fxn; b. CHA2DS2VASc = 2; c. PRN flecainide.   Snores     Family History  Problem Relation Age of Onset   Diabetes Mother    Alcohol abuse Father    Arthritis Father    Heart disease Father    Arthritis Sister    Alcohol abuse Maternal Aunt    Alcohol abuse  Maternal Uncle    Alcohol abuse Paternal Grandfather    Lung cancer Paternal Grandfather        smoker    Arthritis Maternal Grandmother    Schizophrenia Maternal Grandmother    Leukemia Maternal Grandfather    Breast cancer Neg Hx     Social History   Socioeconomic History   Marital status: Legally Separated    Spouse name: Not on file   Number of children: Not on file   Years of education: Not on file   Highest education level: Not on file  Occupational History   Not on file  Tobacco Use   Smoking status: Former    Current packs/day: 0.00    Average packs/day: 1 pack/day for 10.0 years (10.0 ttl pk-yrs)    Types: Cigarettes    Start date: 08/21/1981    Quit date: 08/22/1991    Years since quitting: 31.8   Smokeless tobacco: Never  Vaping Use   Vaping status: Never Used  Substance and Sexual Activity   Alcohol use: Not Currently    Alcohol/week: 0.0 standard drinks of alcohol    Comment: twice a year    Drug use: Not Currently    Types: Marijuana    Comment: not for a couple years   Sexual activity: Yes     Partners: Male    Birth control/protection: Surgical    Comment: Husband  Other Topics Concern   Not on file  Social History Narrative   [employer]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    Lives with husband and 2 children    Son- 70 yo and daughter 73 yo    Pets: 2 dogs    Caffeine- 2 sodas 20 oz, coffee 1 cup occasionally    Enjoys being outside with family       DPR mom Hayden Pedro [phone]    Social Determinants of Health   Financial Resource Strain: Not on file  Food Insecurity: Not on file  Transportation Needs: Not on file  Physical Activity: Not on file  Stress: Not on file  Social Connections: Not on file  Intimate Partner Violence: Not on file    Past Medical History, Surgical history, Social history, and Family history were reviewed and updated as appropriate.   Please see review of systems for further details on the patient's review from today.   Objective:   Physical Exam:  LMP  (LMP Unknown)   Physical Exam Neurological:     Mental Status: She is alert and oriented to person, place, and time.     Cranial Nerves: No dysarthria.  Psychiatric:        Attention and Perception: Attention and perception normal.        Mood and Affect: Mood normal. Mood is not anxious or depressed.        Speech: Speech normal.        Behavior: Behavior is cooperative.        Thought Content: Thought content normal. Thought content is not paranoid or delusional. Thought content does not include homicidal or suicidal ideation. Thought content does not include suicidal plan.        Cognition and Memory: Cognition and memory normal.        Judgment: Judgment normal.     Comments: Insight intact     Lab Review:     Component Value Date/Time   NA 139 04/26/2021 1615   NA 140 05/24/2020 1701   NA 138 11/25/2014 2152  K 3.8 04/26/2021 1615   K 3.7 11/25/2014 2152   CL 106 04/26/2021 1615   CL 105 11/25/2014 2152   CO2 25 04/26/2021 1615   CO2 28 11/25/2014 2152    GLUCOSE 96 04/26/2021 1615   GLUCOSE 98 11/25/2014 2152   BUN 11 04/26/2021 1615   BUN 11 05/24/2020 1701   BUN 10 11/25/2014 2152   CREATININE 0.84 04/26/2021 1615   CREATININE 0.81 11/25/2014 2152   CALCIUM 10.1 04/26/2021 1615   CALCIUM 10.0 11/25/2014 2152   PROT 7.4 04/26/2021 1615   PROT 6.9 05/24/2020 1701   PROT 7.2 11/25/2014 2152   ALBUMIN 4.2 04/26/2021 1615   ALBUMIN 4.6 05/24/2020 1701   ALBUMIN 4.1 11/25/2014 2152   AST 19 04/26/2021 1615   AST 15 11/25/2014 2152   ALT 19 04/26/2021 1615   ALT 13 (L) 11/25/2014 2152   ALKPHOS 88 04/26/2021 1615   ALKPHOS 61 11/25/2014 2152   BILITOT 0.6 04/26/2021 1615   BILITOT 0.2 05/24/2020 1701   BILITOT 0.4 11/25/2014 2152   GFRNONAA >60 04/26/2021 1615   GFRNONAA >60 11/25/2014 2152   GFRAA 90 05/24/2020 1701   GFRAA >60 11/25/2014 2152       Component Value Date/Time   WBC 4.5 04/26/2021 1615   RBC 4.70 04/26/2021 1615   HGB 14.0 04/26/2021 1615   HGB 13.9 06/14/2020 1625   HCT 40.4 04/26/2021 1615   HCT 41.7 06/14/2020 1625   PLT 298 04/26/2021 1615   PLT 367 06/14/2020 1625   MCV 86.0 04/26/2021 1615   MCV 86 06/14/2020 1625   MCV 77 (L) 11/25/2014 2152   MCH 29.8 04/26/2021 1615   MCHC 34.7 04/26/2021 1615   RDW 13.5 04/26/2021 1615   RDW 13.2 06/14/2020 1625   RDW 22.4 (H) 11/25/2014 2152   LYMPHSABS 2.3 05/24/2020 1701   LYMPHSABS 2.4 03/27/2013 0500   MONOABS 0.4 07/30/2018 1033   MONOABS 0.4 03/27/2013 0500   EOSABS 0.1 05/24/2020 1701   EOSABS 0.2 03/27/2013 0500   BASOSABS 0.0 05/24/2020 1701   BASOSABS 0.0 03/27/2013 0500    No results found for: "POCLITH", "LITHIUM"   No results found for: "PHENYTOIN", "PHENOBARB", "VALPROATE", "CBMZ"   .res Assessment: Plan:    Jasi was seen today for follow-up, depression, anxiety and stress.  Diagnoses and all orders for this visit:  Bipolar 1 disorder, mixed, moderate (HCC)  Generalized anxiety disorder -     Discontinue: sertraline  (ZOLOFT) 50 MG tablet; Take 1 tablet (50 mg total) by mouth daily.  Panic disorder with agoraphobia -     LORazepam (ATIVAN) 1 MG tablet; Take 1 tablet (1 mg total) by mouth every 8 (eight) hours as needed for anxiety. -     Discontinue: sertraline (ZOLOFT) 50 MG tablet; Take 1 tablet (50 mg total) by mouth daily.  PTSD (post-traumatic stress disorder) -     Discontinue: sertraline (ZOLOFT) 50 MG tablet; Take 1 tablet (50 mg total) by mouth daily.  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  Claustrophobia -     Discontinue: sertraline (ZOLOFT) 50 MG tablet; Take 1 tablet (50 mg total) by mouth daily.  Other orders -     pantoprazole (PROTONIX) 40 MG tablet; Take 1 tablet (40 mg total) by mouth daily.    15 minutes of video-face to face time with patient was spent on  counseling and coordination of care. We discussed Suniyah has a history of bipolar disorder with heavy seasonal component .  l she has addressed the work stress and it is much better at this time.  Vraylar helped her mood but she could not afford it.  We switched to Abilify 10 mg which she has been on for several months and so far mood stability and depression are better and much calmer.  She is not having any side effects except some wt. We discussed in depth the dosing range of Abilify and typically 15 mg is the usual mood stabilizing dose and she is below that at 10 mg.  However she had side effects at 20 mg which she took accidentally.   She has tried all non-antipsychotic mood stabilizers except CBZ which is similar to oxcarbazapine that has been inadequate. Disc her fears about antipsychotics in detail and she agrees. Discussed potential metabolic side effects associated with atypical antipsychotics, as well as potential risk for movement side effects. Advised pt to contact office if movement side effects occur.    No med changes Continue Abilify 10 mg daily.  Option increase if needed call.. it helped a lot. Continue Abilify 10 mg daily, lamotrigine 200 Better anxious  oxcarbazepine back to 900 mg HS sertraline 50 mg daily.  Consider increase for anxiety  Low B12 and vitamin D can contribute to psych problems and can be consequence of AEDs.  We discussed the short-term risks associated with benzodiazepines including sedation and increased fall risk among others.  Discussed long-term side effect risk including dependence, potential withdrawal symptoms, and the potential eventual dose-related risk of dementia.  Disc risk dependence in detail in terms of the amount that can be used without it. Ativan less sedating than Xanax and helps. Prn panic  needs FMLA bc doctor visits and occ bad day of anxiety bc new supervisor is very strict.  FU 6 mos  [name], MD, DFAPA    Please see After Visit Summary for patient specific instructions.  Future Appointments  Date Time Provider Department Center  11/21/2023  4:30 PM Cottle, Steva Ready., MD [facility] None     No orders of the defined types were placed in this encounter.      -------------------------------

## 2023-05-30 ENCOUNTER — Other Ambulatory Visit: Payer: Self-pay | Admitting: Psychiatry

## 2023-05-30 DIAGNOSIS — F319 Bipolar disorder, unspecified: Secondary | ICD-10-CM

## 2023-05-31 NOTE — Telephone Encounter (Signed)
I am unsure about this rx request. LV 05/22/23; NV *RTC 6 MONTH ; LF 04/05/23

## 2023-06-01 NOTE — Telephone Encounter (Signed)
Rx was sent to local pharmacy. Called patient to verify where it should go and she said [facility]. Sent to [facility] as requested.

## 2023-06-07 ENCOUNTER — Other Ambulatory Visit: Payer: Self-pay | Admitting: Psychiatry

## 2023-06-07 DIAGNOSIS — F40240 Claustrophobia: Secondary | ICD-10-CM

## 2023-06-07 DIAGNOSIS — F411 Generalized anxiety disorder: Secondary | ICD-10-CM

## 2023-06-07 DIAGNOSIS — F4001 Agoraphobia with panic disorder: Secondary | ICD-10-CM

## 2023-06-07 DIAGNOSIS — F4310 Post-traumatic stress disorder, unspecified: Secondary | ICD-10-CM

## 2023-06-07 NOTE — Telephone Encounter (Signed)
LF 08/22; LV 10/1; NV 04/2 (changed rf to 2)

## 2023-06-27 DIAGNOSIS — I10 Essential (primary) hypertension: Secondary | ICD-10-CM | POA: Insufficient documentation

## 2023-06-27 DIAGNOSIS — F319 Bipolar disorder, unspecified: Secondary | ICD-10-CM | POA: Insufficient documentation

## 2023-06-27 DIAGNOSIS — E538 Deficiency of other specified B group vitamins: Secondary | ICD-10-CM | POA: Insufficient documentation

## 2023-07-27 ENCOUNTER — Ambulatory Visit: Payer: Self-pay | Admitting: Student

## 2023-08-07 ENCOUNTER — Other Ambulatory Visit: Payer: Self-pay

## 2023-08-07 ENCOUNTER — Telehealth: Payer: Self-pay | Admitting: Psychiatry

## 2023-08-07 DIAGNOSIS — F40240 Claustrophobia: Secondary | ICD-10-CM

## 2023-08-07 DIAGNOSIS — F411 Generalized anxiety disorder: Secondary | ICD-10-CM

## 2023-08-07 DIAGNOSIS — F4001 Agoraphobia with panic disorder: Secondary | ICD-10-CM

## 2023-08-07 DIAGNOSIS — F4310 Post-traumatic stress disorder, unspecified: Secondary | ICD-10-CM

## 2023-08-07 MED ORDER — SERTRALINE HCL 50 MG PO TABS: 50.0000 mg | ORAL_TABLET | Freq: Every day | ORAL | 0 refills | Status: DC

## 2023-08-07 NOTE — Telephone Encounter (Signed)
Pt called at 11:15a.  She stated that for the Setraline, the pharmacy says they need a new script sent in if the emergency refill is for more than 3 days.  She needs enough till Dec 31 when [facility] says she can get refilled.  She's asking for a new script to be sent to   [id number] [place], [place] - [address] AT [facility] [address] [place], [place] [address] Phone: [phone]  Fax: [phone]    Next appt 4/2

## 2023-08-07 NOTE — Telephone Encounter (Signed)
SENT 14 DAY SUPPLY TO REQUESTED PHARMACY.

## 2023-08-08 ENCOUNTER — Other Ambulatory Visit: Payer: Self-pay | Admitting: Psychiatry

## 2023-08-08 DIAGNOSIS — F319 Bipolar disorder, unspecified: Secondary | ICD-10-CM

## 2023-09-07 ENCOUNTER — Ambulatory Visit: Payer: Self-pay | Admitting: Family Medicine

## 2023-09-21 ENCOUNTER — Encounter: Payer: Self-pay | Admitting: Podiatry

## 2023-09-21 ENCOUNTER — Ambulatory Visit: Payer: Managed Care, Other (non HMO) | Admitting: Podiatry

## 2023-09-21 DIAGNOSIS — L600 Ingrowing nail: Secondary | ICD-10-CM | POA: Diagnosis not present

## 2023-10-12 ENCOUNTER — Ambulatory Visit: Payer: Managed Care, Other (non HMO) | Admitting: Podiatry

## 2023-10-31 ENCOUNTER — Other Ambulatory Visit: Payer: Self-pay | Admitting: Psychiatry

## 2023-10-31 DIAGNOSIS — F319 Bipolar disorder, unspecified: Secondary | ICD-10-CM

## 2023-11-02 ENCOUNTER — Encounter: Payer: Self-pay | Admitting: Emergency Medicine

## 2023-11-02 ENCOUNTER — Other Ambulatory Visit: Payer: Self-pay

## 2023-11-02 ENCOUNTER — Ambulatory Visit
Admission: EM | Admit: 2023-11-02 | Discharge: 2023-11-02 | Disposition: A | Discharge status: Home / Self Care | Attending: Emergency Medicine | Admitting: Emergency Medicine

## 2023-11-02 DIAGNOSIS — R051 Acute cough: Secondary | ICD-10-CM

## 2023-11-02 DIAGNOSIS — J069 Acute upper respiratory infection, unspecified: Secondary | ICD-10-CM

## 2023-11-02 LAB — POC COVID19/FLU A&B COMBO
Covid Antigen, POC: NEGATIVE
Influenza A Antigen, POC: NEGATIVE
Influenza B Antigen, POC: NEGATIVE

## 2023-11-02 MED ORDER — BENZONATATE 100 MG PO CAPS: ORAL_CAPSULE | ORAL | 0 refills | Status: DC

## 2023-11-02 MED ORDER — PREDNISONE 10 MG (21) PO TBPK: ORAL_TABLET | Freq: Every day | ORAL | 0 refills | Status: DC

## 2023-11-02 MED ORDER — PROMETHAZINE-DM 6.25-15 MG/5ML PO SYRP: 5.0000 mL | ORAL_SOLUTION | Freq: Every evening | ORAL | 0 refills | Status: DC | PRN

## 2023-11-02 NOTE — ED Triage Notes (Signed)
 Patient presents to [facility] for evaluation of cough, sore throat, runny nose, fever, fatigue, and malaise since Tuesday.

## 2023-11-02 NOTE — Discharge Instructions (Signed)
 Your symptoms today are most likely being caused by a virus and should steadily improve in time it can take up to 7 to 10 days before you truly start to see a turnaround however things will get better  Flu test negative  Starting tomorrow take prednisone every morning with food to help reduce sinus pressure  You may use Tessalon pill every 8 hours as needed, may use cough syrup at bedtime for comfort    You can take Tylenol and/or Ibuprofen as needed for fever reduction and pain relief.   For cough: honey 1/2 to 1 teaspoon (you can dilute the honey in water or another fluid).  You can also use guaifenesin and dextromethorphan for cough. You can use a humidifier for chest congestion and cough.  If you don't have a humidifier, you can sit in the bathroom with the hot shower running.      For sore throat: try warm salt water gargles, cepacol lozenges, throat spray, warm tea or water with lemon/honey, popsicles or ice, or OTC cold relief medicine for throat discomfort.   For congestion: take a daily anti-histamine like Zyrtec, Claritin, and a oral decongestant, such as pseudoephedrine.  You can also use Flonase 1-2 sprays in each nostril daily.   It is important to stay hydrated: drink plenty of fluids (water, gatorade/powerade/pedialyte, juices, or teas) to keep your throat moisturized and help further relieve irritation/discomfort.

## 2023-11-02 NOTE — ED Provider Notes (Signed)
 [name] [name]    CSN: [id number] Arrival date & time: 11/02/23  1854      History   Chief Complaint Chief Complaint  Patient presents with   URI    HPI [name] [name] is a 58 y.o. female.   Patient presents for evaluation of fever peaking at 100.3, chills, body aches, nasal congestion, productive cough, sore throat, nausea vomiting and diarrhea beginning 2 days ago.  Shortness of breath and wheezing only experienced when coughing.  Nausea vomiting and diarrhea have resolved.  Able to tolerate food and liquids.  Attempted use of Mucinex DM which was ineffective.  No known sick contacts prior.   Past Medical History:  Diagnosis Date   Anxiety    Arthritis    B12 deficiency    Chicken pox    Depression    Fibroid    GERD (gastroesophageal reflux disease)    History of hiatal hernia    Hypertension    Iron deficiency anemia    Menorrhagia    Migraines    PAF (paroxysmal atrial fibrillation) (HCC)    a. 04/2013 Echo: EF 55-60%, impaired relaxation. Mild MR/TR. Nl RV fxn; b. CHA2DS2VASc = 2; c. PRN flecainide.   Snores     Patient Active Problem List   Diagnosis Date Noted   Essential hypertension 06/27/2023   Vitamin B12 deficiency 06/27/2023   Bipolar 1 disorder (HCC) 06/27/2023   Pain in thoracic spine 03/28/2021   Generalized abdominal pain 05/24/2020   Gastric polyp    Esophageal dysphagia    Polyp of sigmoid colon    Hypertrophy of anal papillae    Diverticulosis of large intestine without diverticulitis    HLD (hyperlipidemia) 03/12/2019   Vitamin D deficiency 03/12/2019   History of IBS 03/06/2019   Pure hypertriglyceridemia 02/27/2019   Menopause 02/27/2019   Uterus, adenomyosis 08/07/2018   S/P total hysterectomy and BSO (bilateral salpingo-oophorectomy) 08/05/2018   B12 deficiency 06/04/2018   PTSD (post-traumatic stress disorder) 05/14/2018   HSV (herpes simplex virus) infection 11/27/2017   Anemia 03/08/2017   HNP (herniated nucleus  pulposus), cervical 07/29/2015   Cervical radiculitis 04/22/2015   Bipolar I disorder, most recent episode depressed (HCC) 12/10/2014   GERD (gastroesophageal reflux disease) 12/10/2014   Migraines 12/10/2014   PAF (paroxysmal atrial fibrillation) (HCC) 04/10/2013    Past Surgical History:  Procedure Laterality Date   ABDOMINAL HYSTERECTOMY     APPENDECTOMY  5/11   C-spine surgery     CESAREAN SECTION  10/98,9/12   COLONOSCOPY WITH PROPOFOL N/A 03/26/2019   Procedure: COLONOSCOPY WITH PROPOFOL;  Surgeon: Pasty Spillers, MD;  Location: [facility];  Service: Endoscopy;  Laterality: N/A;   ESOPHAGOGASTRODUODENOSCOPY (EGD) WITH PROPOFOL N/A 03/26/2019   Procedure: ESOPHAGOGASTRODUODENOSCOPY (EGD) WITH PROPOFOL;  Surgeon: Pasty Spillers, MD;  Location: [facility];  Service: Endoscopy;  Laterality: N/A;   HYSTERECTOMY ABDOMINAL WITH SALPINGO-OOPHORECTOMY Bilateral 08/05/2018   Procedure: HYSTERECTOMY ABDOMINAL WITH BILATERAL SALPINGO-OOPHORECTOMY;  Surgeon: Herold Harms, MD;  Location: [facility] ORS;  Service: Gynecology;  Laterality: Bilateral;   TONSILLECTOMY  03/2008   TUBAL LIGATION      OB History     Gravida  5   Para  3   Term  2   Preterm  1   AB  2   Living  2      SAB      IAB  2   Ectopic      Multiple      Live Births  2        Obstetric Comments  1st Menstrual Cycle:  16 1st Pregnancy:  17          Home Medications    Prior to Admission medications   Medication Sig Start Date End Date Taking? Authorizing Provider  predniSONE (STERAPRED UNI-PAK 21 TAB) 10 MG (21) TBPK tablet Take by mouth daily. Take 6 tabs by mouth daily  for 1 days, then 5 tabs for 1 days, then 4 tabs for 1 days, then 3 tabs for 1 days, 2 tabs for 1 days, then 1 tab by mouth daily for 1 days 11/02/23  Yes Travonne Schowalter R, NP  promethazine-dextromethorphan (PROMETHAZINE-DM) 6.25-15 MG/5ML syrup Take 5 mLs by mouth at bedtime as needed. 11/02/23  Yes March Steyer,  Nariya Neumeyer R, NP  amLODipine (NORVASC) 5 MG tablet Take 1 tablet (5 mg total) by mouth daily. 01/24/22   Antonieta Iba, MD  ARIPiprazole (ABILIFY) 10 MG tablet TAKE 1 TABLET BY MOUTH DAILY 04/04/23   Cottle, Steva Ready., MD  ARIPiprazole (ABILIFY) 10 MG tablet Take 1 tablet by mouth daily. 04/04/23   [provider]  benzonatate (TESSALON) 100 MG capsule Take 1-2 tablets 3 times a day as needed for cough 11/02/23   Ules Marsala, Elita Boone, NP  cyclobenzaprine (FLEXERIL) 5 MG tablet Take 1 tablet (5 mg total) by mouth 3 (three) times daily as needed. 01/18/21   Menshew, Charlesetta Ivory, PA-C  ibuprofen (ADVIL) 200 MG tablet Takes 1-3 tablets as needed    [provider]  lamoTRIgine (LAMICTAL) 200 MG tablet TAKE 1 TABLET BY MOUTH AT  BEDTIME 08/08/23   Cottle, Steva Ready., MD  LORazepam (ATIVAN) 1 MG tablet Take 1 tablet (1 mg total) by mouth every 8 (eight) hours as needed for anxiety. 05/22/23   Cottle, Steva Ready., MD  Multiple Vitamin (MULTIVITAMIN WITH MINERALS) TABS tablet Take 1 tablet by mouth at bedtime.    [provider]  Oxcarbazepine (TRILEPTAL) 300 MG tablet TAKE 3 TABLETS BY MOUTH EVERY  NIGHT AT BEDTIME 11/01/23   Cottle, Steva Ready., MD  pantoprazole (PROTONIX) 40 MG tablet Take 1 tablet (40 mg total) by mouth daily. 05/22/23   Cottle, Steva Ready., MD  sertraline (ZOLOFT) 50 MG tablet Take 1 tablet (50 mg total) by mouth daily. 08/07/23   Cottle, Steva Ready., MD  tiZANidine (ZANAFLEX) 4 MG tablet Take 1 tablet every 8 hours by oral route as needed. 04/11/21   [provider]    Family History Family History  Problem Relation Age of Onset   Diabetes Mother    Alcohol abuse Father    Arthritis Father    Heart disease Father    Arthritis Sister    Alcohol abuse Maternal Aunt    Alcohol abuse Maternal Uncle    Alcohol abuse Paternal Grandfather    Lung cancer Paternal Grandfather        smoker    Arthritis Maternal Grandmother    Schizophrenia Maternal  Grandmother    Leukemia Maternal Grandfather    Breast cancer Neg Hx     Social History Social History   Tobacco Use   Smoking status: Former    Current packs/day: 0.00    Average packs/day: 1 pack/day for 10.0 years (10.0 ttl pk-yrs)    Types: Cigarettes    Start date: 08/21/1981    Quit date: 08/22/1991    Years since quitting: 32.2   Smokeless tobacco: Never  Vaping Use  Vaping status: Never Used  Substance Use Topics   Alcohol use: Not Currently    Alcohol/week: 0.0 standard drinks of alcohol    Comment: twice a year    Drug use: Not Currently    Types: Marijuana    Comment: not for a couple years     Allergies   Phenylmercuric nitrate, Augmentin [amoxicillin-pot clavulanate], Codeine, Diclofenac, Latex, Neomycin, and Percocet [oxycodone-acetaminophen]   Review of Systems Review of Systems   Physical Exam Triage Vital Signs ED Triage Vitals  Encounter Vitals Group     BP 11/02/23 1905 93/67     Systolic BP Percentile --      Diastolic BP Percentile --      Pulse Rate 11/02/23 1905 79     Resp 11/02/23 1905 16     Temp 11/02/23 1905 97.6 F (36.4 C)     Temp Source 11/02/23 1905 Temporal     SpO2 11/02/23 1905 95 %     Weight --      Height --      Head Circumference --      Peak Flow --      Pain Score 11/02/23 1907 5     Pain Loc --      Pain Education --      Exclude from Growth Chart --    No data found.  Updated Vital Signs BP 93/67 (BP Location: Left Arm)   Pulse 79   Temp 97.6 F (36.4 C) (Temporal)   Resp 16   LMP  (LMP Unknown)   SpO2 95%   Visual Acuity Right Eye Distance:   Left Eye Distance:   Bilateral Distance:    Right Eye Near:   Left Eye Near:    Bilateral Near:     Physical Exam Constitutional:      Appearance: Normal appearance.  HENT:     Head: Normocephalic.     Right Ear: Tympanic membrane, ear canal and external ear normal.     Left Ear: Tympanic membrane, ear canal and external ear normal.     Nose:  Congestion present. No rhinorrhea.     Mouth/Throat:     Pharynx: Posterior oropharyngeal erythema present. No oropharyngeal exudate.  Eyes:     Extraocular Movements: Extraocular movements intact.  Cardiovascular:     Rate and Rhythm: Normal rate and regular rhythm.     Pulses: Normal pulses.     Heart sounds: Normal heart sounds.  Pulmonary:     Effort: Pulmonary effort is normal.     Breath sounds: Normal breath sounds.  Musculoskeletal:     Cervical back: Normal range of motion and neck supple.  Neurological:     Mental Status: She is alert and oriented to person, place, and time. Mental status is at baseline.      UC Treatments / Results  Labs (all labs ordered are listed, but only abnormal results are displayed) Labs Reviewed  POC COVID19/FLU A&B COMBO - Normal    EKG   Radiology No results found.  Procedures Procedures (including critical care time)  Medications Ordered in UC Medications - No data to display  Initial Impression / Assessment and Plan / UC Course  I have reviewed the triage vital signs and the nursing notes.  Pertinent labs & imaging results that were available during my care of the patient were reviewed by me and considered in my medical decision making (see chart for details). Viral URI with cough  Patient is in no signs  of distress nor toxic appearing.  Vital signs are stable.  Low suspicion for pneumonia, pneumothorax or bronchitis and therefore will defer imaging.  Covid and flu test negative.  Prescribed prednisone, Tessalon and Promethazine DM.May use additional over-the-counter medications as needed for supportive care.  May follow-up with urgent care as needed if symptoms persist or worsen.   Final Clinical Impressions(s) / UC Diagnoses   Final diagnoses:  Viral URI with cough     Discharge Instructions      Your symptoms today are most likely being caused by a virus and should steadily improve in time it can take up to 7 to 10  days before you truly start to see a turnaround however things will get better  Flu test negative  Starting tomorrow take prednisone every morning with food to help reduce sinus pressure  You may use Tessalon pill every 8 hours as needed, may use cough syrup at bedtime for comfort    You can take Tylenol and/or Ibuprofen as needed for fever reduction and pain relief.   For cough: honey 1/2 to 1 teaspoon (you can dilute the honey in water or another fluid).  You can also use guaifenesin and dextromethorphan for cough. You can use a humidifier for chest congestion and cough.  If you don't have a humidifier, you can sit in the bathroom with the hot shower running.      For sore throat: try warm salt water gargles, cepacol lozenges, throat spray, warm tea or water with lemon/honey, popsicles or ice, or OTC cold relief medicine for throat discomfort.   For congestion: take a daily anti-histamine like Zyrtec, Claritin, and a oral decongestant, such as pseudoephedrine.  You can also use Flonase 1-2 sprays in each nostril daily.   It is important to stay hydrated: drink plenty of fluids (water, gatorade/powerade/pedialyte, juices, or teas) to keep your throat moisturized and help further relieve irritation/discomfort.    ED Prescriptions     Medication Sig Dispense Auth. Provider   benzonatate (TESSALON) 100 MG capsule Take 1-2 tablets 3 times a day as needed for cough 30 capsule Briar Witherspoon R, NP   promethazine-dextromethorphan (PROMETHAZINE-DM) 6.25-15 MG/5ML syrup Take 5 mLs by mouth at bedtime as needed. 118 mL Ana Liaw R, NP   predniSONE (STERAPRED UNI-PAK 21 TAB) 10 MG (21) TBPK tablet Take by mouth daily. Take 6 tabs by mouth daily  for 1 days, then 5 tabs for 1 days, then 4 tabs for 1 days, then 3 tabs for 1 days, 2 tabs for 1 days, then 1 tab by mouth daily for 1 days 21 tablet Koltan Portocarrero, Elita Boone, NP      PDMP not reviewed this encounter.   Valinda Hoar, NP 11/02/23  [phone]

## 2023-11-02 NOTE — ED Notes (Signed)
Provider aware of patient's BP

## 2023-11-13 ENCOUNTER — Telehealth: Payer: Self-pay | Admitting: Psychiatry

## 2023-11-13 DIAGNOSIS — Z0289 Encounter for other administrative examinations: Secondary | ICD-10-CM

## 2023-11-13 NOTE — Telephone Encounter (Signed)
 Received fax STD from [email or web address] Pt paid $45. Put in Traci's box

## 2023-11-15 ENCOUNTER — Other Ambulatory Visit: Payer: Self-pay | Admitting: Psychiatry

## 2023-11-15 DIAGNOSIS — F4001 Agoraphobia with panic disorder: Secondary | ICD-10-CM

## 2023-11-20 ENCOUNTER — Other Ambulatory Visit: Payer: Self-pay | Admitting: Psychiatry

## 2023-11-21 ENCOUNTER — Encounter: Payer: Self-pay | Admitting: Psychiatry

## 2023-11-21 ENCOUNTER — Telehealth: Payer: 59 | Admitting: Psychiatry

## 2023-11-21 DIAGNOSIS — F411 Generalized anxiety disorder: Secondary | ICD-10-CM | POA: Diagnosis not present

## 2023-11-21 DIAGNOSIS — F4310 Post-traumatic stress disorder, unspecified: Secondary | ICD-10-CM | POA: Diagnosis not present

## 2023-11-21 DIAGNOSIS — E538 Deficiency of other specified B group vitamins: Secondary | ICD-10-CM

## 2023-11-21 DIAGNOSIS — R5382 Chronic fatigue, unspecified: Secondary | ICD-10-CM

## 2023-11-21 DIAGNOSIS — R7989 Other specified abnormal findings of blood chemistry: Secondary | ICD-10-CM

## 2023-11-21 DIAGNOSIS — F4001 Agoraphobia with panic disorder: Secondary | ICD-10-CM

## 2023-11-21 DIAGNOSIS — F3162 Bipolar disorder, current episode mixed, moderate: Secondary | ICD-10-CM

## 2023-11-21 NOTE — Progress Notes (Signed)
 [name] [name] [id number] [birth year] 58 y.o.   Video Visit via My Chart  I connected with pt by video using My Chart and verified that I am speaking with the correct person using two identifiers.   I discussed the limitations, risks, security and privacy concerns of performing an evaluation and management service by My Chart  and the availability of in person appointments. I also discussed with the patient that there may be a patient responsible charge related to this service. The patient expressed understanding and agreed to proceed.  I discussed the assessment and treatment plan with the patient. The patient was provided an opportunity to ask questions and all were answered. The patient agreed with the plan and demonstrated an understanding of the instructions.   The patient was advised to call back or seek an in-person evaluation if the symptoms worsen or if the condition fails to improve as anticipated.  I provided 30 minutes of video time during this encounter.  The patient was located at home and the provider was located office. Session [id number]  Subjective:   Patient ID:  [name] [name] is a 58 y.o. (DOB 09/19/1965) female.  Chief Complaint:  Chief Complaint  Patient presents with   Follow-up   Depression   Anxiety    Depression        Associated symptoms include fatigue.  Associated symptoms include no decreased concentration, no headaches and no suicidal ideas.  Past medical history includes anxiety.   Anxiety Symptoms include nervous/anxious behavior and palpitations. Patient reports no confusion, decreased concentration, dizziness or suicidal ideas.     [name] [name] presents to the office today for follow-up of several dxes.  At visit  January 2020.  She had recently been diagnosed with low vitamin B12.  She was initiating treatment.  No meds were changed.  visit July 2020.  her anxiety was higher after reducing sertraline.  Therefore it was increased back to 100  mg daily.  06/2020 appt with the following noted: Cardiologist said she had chronic tx for afib and PVC's and it acts up more when she's under more stress.  Cardiologist suggested Bz when having the episodes.  Better at present but has periods of them daily.  They last an hour or more.  Rarely takes clonazepam, but it helps in 30 mins.  Tolerates clonazepam well otherwise. She felt increase sertraline helped some with anxiety.  It is better.  More irritable last couple of weeks than anxious unless heart episode.  Missed a couple of weeks of hormones which might have caused this. Panic attacks from 0-2 weekly is typical but episodes where she may have them daily for a couple of weeks associated with the palpitations as noted. Plan: DC clonazepam Start lorazepam 1 mg every 8 hours as needed anxiety attacks.  Call back if it is less effective than clonazepam or less well-tolerated.  04/06/20 appt with the following noted: Real stressed out and feels everything caving in on her.  Work, home and everything.  D broke her leg and ex H had to stay with them. Supervisor stress and short-staffed.  Doesn't want the vaccine. Only one RX lorazepam.  It helped.  But afraid of addiction.  Tends to isolate weekends bc stress. Consistent with oxcarb 900 mg HS, sertraline 100, lamotrigine 200. Wanting to stay up late and then has EMA.   Not hyper, never.  Is irritable.   ExH's family gives her a hard time and drags D into it. Plan no  med changes:  10/05/2020 appointment with the following noted: Some anger and anxiety but probably situational over the Covid situation and dealing with work situation.  Wakes that way a lot of times.  Irritable.  Walked out of work one day.  In the background all the time but will come out with triggers.  Controls it usually.  No panic. Plan: Increase Trileptal to 300 mg AM & 900 mg PM.   10/20/20 TC Rtc to patient and she reports feeling overwhelmed and stressed at work causing  increased anxiety. She reports "going off" on her boss Friday. She reports there is someone at work going around telling everyone that patient is bipolar. [name]. She doesn't know how she would know. [name]. I asked if this was discussed at her last visit on 10/05/2020 and she said no. We discussed what she is thinking she needs, she really wants to see what Dr. Jennelle Human thinks and get his opinion. She reports she thinks she needs 2 weeks off to get to feeling better and remove herself from work for right now. Then intermittent from that time. Informed her that is something he would probably do but I would discuss with him. OK'd  FMLA  11/23/20 TC: Message from staff: Addendum to previous messages. Next appt is 11/29/20 at 1:30. She has called back and said that she is having trouble at work with her co-workers. She wants to check into a hospital but she told me she didn't have any suicide thoughts. She is under a lot of stress at work. Her best friend told someone at her employer that she has bipolar and co-workers are giving her a hard time because of the diagnosis. She said that she is still taking her meds. Her blood pressure when taken a couple of days ago was 184/101 and 148/88. She would like to be pointed in the right direction of what she needs to do? Her phone number is [phone]. She would like to speak to someone before her visit on Monday.   MD TC with pt:  Work stress with boss spreading info that she's bipolar.  Getting knit picked at work.  Did this to another friend who had bipolar disorder. Works [employer].  Talked to HR yesterday.  Get a final warning writeup and feels it is unjust.  Working there since 2017 and all started in last 2 mos.    Was doing fine until this all started.  Feels discriminated against.  No SI.  Will see her Monday and start FMLA for a couple of weeks and we'll make med  changes. She agrees to the plan.  [name], MD, DFAPA  11/29/20 appt noted: Worked today.  Got new supervisor who's cleaning house.  She feels picked on.  This has made her mad and she has expressed anger at work and that's gotten her written up. I feel on edge all the time.  Also overwhelmed at home and problems with daughter as well.  Problems keeping things straight at home. More anger and irritability.   Patient denies difficulty with sleep initiation or maintenance. Denies appetite disturbance.  Patient reports that energy and motivation have been good.  She still has complaints of concentration and forgetfulness as previously noted.  Patient denies any suicidal ideation.  Still forgetful.   01/27/21 appt noted: Feels she's being mistreated at work bc she's bipolar.  Plans to get  a new job.  Trying to get house ready to sale but D and son won't help with it.  Needed a leave from work.  Had panic attack last week thinking of going there.  Cried at work when there the last day. She is more down as well as agitated and angry.  She has difficulty react really relaxing.  She easily panics.  She has difficulty concentrating.  She has feelings of hopelessness and helplessness.  No suicidal thoughts She has not tried the [employer] as suggested so far but agrees to do so. Plan: Start Vraylar 1.5 mg daily and if it works will wean other meds. Agree with medical leave until August 1   02/23/2021 appointment with the following noted: Disability approved until 02/28/21 but requested until August 1. Doesn't feel capable of RTW due to panic over thoughts of it.  Feels people are talking about her negatively at work.  Too anxious being around coworkers. Did not start Vraylar bc scared of antipsychotics. Sx continue as noted above. HA and GI problems better not at work. Anxiety about work causes these sx and her BP to go up Plan: Start Vraylar 1.5 mg daily and if it works will wean other meds.   She agrees  this time to take it.  Call if you change your mind or don't tolerate it. Agree with extending medical leave until August 8 due to depression, mood swings, anger outbursts, crying spells that interfere with ability to do her work and reduced concentration.   03/23/2021 phone call from patient:Patient lm to cancel appt on 8/5 stating if she was doing well per Dr Jennelle Human she didn't need it. She rescheduled for October next available. Patient will need a Rx for the Vraylar for samples were given at her last visit. 04/18/2021 phone call from patient:Pt left a message that the vraylar is to expensive and she will not be taking this medicine. Sent prescription for Abilify 10 mg in place of Vraylar  05/31/2021 appointment with the following noted: Insurance would not pay for [employer]. Abilify 10 mg worked pretty well for 5 and 1/2 weeks.  No SE now. Once accidentally took 20 mg and got dizzy with NV. Seen mood benefit with less depression and less irritable. Had argument with mother and threatened OD and IVC spent 12 hours in hospital after OD of 5 mg lorazepam but wasn't trying to kill herself but was really mad at mother in a rage.  Doesn't get violent but mother triggers her. Boss now leaving her alone since her leave.  She's had no work performance problems.  She has talked to boss and it went well.  He's backed off micromanagment. No SE.  Eating less.  Happier. Plan: Continue Abilify 10 mg daily.  Option increase if needed call..  07/19/2021 phone call: Patient complaining that she felt shaky and restless on Abilify 10 mg for several weeks.  She was offered the option to reduce the dose.  08/11/2021 appointment with the following noted: For awhile had restless legs around knees but gone now. Family says she's doing better on it, not as irritable.  Not as on edge.   Still on Abilify 10 mg daily, lamotrigine 200, oxcarbazepine 900 mg HS, sertraline 100 mg daily. Not really depressed generally.  Sleep  is ok but awakens early to go to bathroom.  At least 7 hours. Good sleeper.   No bouts SI since here.   Good work function. SE none unless weight gain. Plan: Continue Abilify  10 mg daily.  Option increase if needed call.. Continue Abilify 10 mg daily, lamotrigine 200, oxcarbazepine 900 mg HS,  Reduce sertraline 75 mg daily to see if can lose weight.  11/07/2021 appointment with the following noted: Reduced sertraline to 50 mg for 3 weeks. Doing ok.  Cut to 25 mg daily but too irritable.  Manageable on 50 mg daily. Anxiety about the same and OK with benefit with Ativan.  Life is stressful.   Ok with depression.  Calmer on Abilify.  But used to chaos and it seems bored.   Satisfied with meds. Tired all the time.  Sleep is good.  Alsways been able to sleep a lot. D is ADD and she questions Being ADD. IN: Continue Abilify 10 mg daily.  Option increase if needed call.. Continue Abilify 10 mg daily, lamotrigine 200, oxcarbazepine 900 mg HS,  Reduce sertraline 50 mg daily to see if can lose weight. OK trial modafinil for poss ADD and chronic fatigue 100-200mg  am .  Disc [facility]  04/25/2022 appointment noted: Never got modafinil DT cost Fine with reduced sertraline. Anxiety is not worse and mood is better.  Don't let stuff bother me as much as I used to. Still has a lot of chronic anxiety. Feels better with Abilify.  Less obs thoughts and less irritable and not yelling.  07/27/22 appt noted; No problems with reduction oxcarb.  No SE change. Anxiety is a little worse and thinks it comes from work.  Not feel anxious in her mind but her body reacts with sweating and N.  Anx not worse in other situations.  Gets racing thoughts more now, repetitive.   Modafinil didn't help fatigue or alterness and caused SE insomnia. Otherwise sleeps well.  8 hr work days and 10 h weekends. M and son say she has better mood with Abilify. Plan: Continue Abilify 10 mg daily.  Option increase if needed call.. it helped  a lot. Continue Abilify 10 mg daily, lamotrigine 200 More anxious with less so increase oxcarbazepine bcakc to 900 mg HS sertraline 50 mg daily.  Consider increase for anxiety  11/16/22 appt noted: Not much lorazepam Anxiety better with increase Trileptal 900 mg HS, Abilify 10, sertraline 50, lamotrigine 200,  A lot calmer with Abilify and mother notices.  No anger swings. Sleep pretty good No panic attacks. No sig SE.  Dizzy if forgets meds. Easy job but very stressful dealing with management Was told she had low b12, still working on getting it up.  Getting shots. Plan: Continue Abilify 10 mg daily, lamotrigine 200 Better anxious  oxcarbazepine back to 900 mg HS sertraline 50 mg daily.  Consider increase for anxiety  05/22/23 appt noted: Meds as above:  lorazepam 1 mg prn stress.  Others No sig SE unless Abilify wt gain .  Gained 15#/3 mos Likes the benefit from Abilify. Pretty good mood overall.  No panic.  Normal mood . "Calmest I've been in a long time" Asks me to refill Protonix which works bc hard to get into her doctorl.   Pleased with meds. Plan no med changes: No med changes Continue Abilify 10 mg daily.  Option increase if needed call.. it helped a lot. Continue Abilify 10 mg daily, lamotrigine 200 Better anxious  oxcarbazepine back to 900 mg HS sertraline 50 mg daily.  Consider increase for anxiety  11/21/23 appt noted: video Meds:  Abilify 10, lamotrigine 200 HS, oxcarbazepine 900 HS, sertraline 50.  Lorazepam 1 mg BID-TID A lot more anxiety.  Needing more lorazepam.  F in hosp since DEC and will likely be coming to live with her.  Put in for FMLA.  DT anxiety and caring for father.  Wants a month off from work for anxiety.  No time to be depressed .  Can't sleep .  Feel like crawling out of her skin.   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  Would like May 1 RTW.   EMA  with anxiety. Feels nervous and shakey a lot.  N.  Not eating well.  Having trouble functioning.  Worse anxiety for a couple of months.  Thought he was dying 3 different times. Her anxiety was ok and managed until F hosp Xmas eve.  Anxiety gradually worse.  Like PTSD sort of. F delirium, pneumonia and ventilator twice.     hysterectomy for endometriosis and normally takes a patch distributing hormones though she is recently been out and it worsened her mood when she was not taking it..  Worried about hormones affecting mood.  Hx bad PMS.  Prior psychiatric medication trials include  sertraline,  Depakote,  lithium, lamotrigine, Trileptal 900, Saphris, risperidone, Vraylar 1.5 worked but $, Abilify 10 Clonazepam.  Xanax SE, lorazepam 1. Modafinil NR and SE ins  F psych sx better with Abilify  Review of Systems:  Review of Systems  Constitutional:  Positive for fatigue. Negative for fever.  HENT:  Positive for hearing loss.   Cardiovascular:  Positive for palpitations.  Neurological:  Negative for dizziness, tremors and headaches.  Psychiatric/Behavioral:  Positive for depression and dysphoric mood. Negative for agitation, behavioral problems, confusion, decreased concentration, hallucinations, self-injury, sleep disturbance and suicidal ideas. The patient is nervous/anxious. The patient is not hyperactive.     Medications: I have reviewed the patient's current medications.  Current Outpatient Medications  Medication Sig Dispense Refill   amLODipine (NORVASC) 5 MG tablet Take 1 tablet (5 mg total) by mouth daily. 180 tablet 3   ARIPiprazole (ABILIFY) 10 MG tablet TAKE 1 TABLET BY MOUTH DAILY 90 tablet 3   benzonatate (TESSALON) 100 MG capsule Take 1-2 tablets 3 times a day as needed for cough 30 capsule 0   cyclobenzaprine (FLEXERIL) 5 MG tablet Take 1 tablet (5 mg total) by mouth 3 (three) times daily as needed. 15 tablet 0   ibuprofen (ADVIL) 200 MG tablet Takes 1-3 tablets as needed      lamoTRIgine (LAMICTAL) 200 MG tablet TAKE 1 TABLET BY MOUTH AT  BEDTIME 90 tablet 3   LORazepam (ATIVAN) 1 MG tablet Take 1 tablet (1 mg total) by mouth every 8 (eight) hours as needed for anxiety. 30 tablet 2   Multiple Vitamin (MULTIVITAMIN WITH MINERALS) TABS tablet Take 1 tablet by mouth at bedtime.     Oxcarbazepine (TRILEPTAL) 300 MG tablet TAKE 3 TABLETS BY MOUTH EVERY  NIGHT AT BEDTIME 270 tablet 0   pantoprazole (PROTONIX) 40 MG tablet Take 1 tablet (40 mg total) by mouth daily. 30 tablet 5   promethazine-dextromethorphan (PROMETHAZINE-DM) 6.25-15 MG/5ML syrup Take 5 mLs by mouth at bedtime as needed. 118 mL 0   sertraline (ZOLOFT) 50 MG tablet Take 1 tablet (50 mg total) by mouth daily. 14 tablet 0   tiZANidine (ZANAFLEX) 4 MG tablet Take 1 tablet every 8 hours by oral route as needed.     No current facility-administered medications for this visit.  Medication Side Effects: None  Allergies:  Allergies  Allergen Reactions   Phenylmercuric Nitrate Rash   Augmentin [Amoxicillin-Pot Clavulanate] Nausea Only    Has patient had a PCN reaction causing immediate rash, facial/tongue/throat swelling, SOB or lightheadedness with hypotension: No Has patient had a PCN reaction causing severe rash involving mucus membranes or skin necrosis: No Has patient had a PCN reaction that required hospitalization: No Has patient had a PCN reaction occurring within the last 10 years: Yes If all of the above answers are "NO", then may proceed with Cephalosporin use.    Codeine Nausea Only   Diclofenac Nausea Only   Latex Rash   Neomycin Rash   Percocet [Oxycodone-Acetaminophen] Rash    Hives     Past Medical History:  Diagnosis Date   Anxiety    Arthritis    B12 deficiency    Chicken pox    Depression    Fibroid    GERD (gastroesophageal reflux disease)    History of hiatal hernia    Hypertension    Iron deficiency anemia    Menorrhagia    Migraines    PAF (paroxysmal atrial  fibrillation) (HCC)    a. 04/2013 Echo: EF 55-60%, impaired relaxation. Mild MR/TR. Nl RV fxn; b. CHA2DS2VASc = 2; c. PRN flecainide.   Snores     Family History  Problem Relation Age of Onset   Diabetes Mother    Alcohol abuse Father    Arthritis Father    Heart disease Father    Arthritis Sister    Alcohol abuse Maternal Aunt    Alcohol abuse Maternal Uncle    Alcohol abuse Paternal Grandfather    Lung cancer Paternal Grandfather        smoker    Arthritis Maternal Grandmother    Schizophrenia Maternal Grandmother    Leukemia Maternal Grandfather    Breast cancer Neg Hx     Social History   Socioeconomic History   Marital status: Legally Separated    Spouse name: Not on file   Number of children: Not on file   Years of education: Not on file   Highest education level: Not on file  Occupational History   Not on file  Tobacco Use   Smoking status: Former    Current packs/day: 0.00    Average packs/day: 1 pack/day for 10.0 years (10.0 ttl pk-yrs)    Types: Cigarettes    Start date: 08/21/1981    Quit date: 08/22/1991    Years since quitting: 32.2   Smokeless tobacco: Never  Vaping Use   Vaping status: Never Used  Substance and Sexual Activity   Alcohol use: Not Currently    Alcohol/week: 0.0 standard drinks of alcohol    Comment: twice a year    Drug use: Not Currently    Types: Marijuana    Comment: not for a couple years   Sexual activity: Yes    Partners: Male    Birth control/protection: Surgical    Comment: Husband  Other Topics Concern   Not on file  Social History Narrative   [facility]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    Lives with husband and 2 children    Son- 68 yo and daughter 106 yo    Pets: 2 dogs    Caffeine- 2 sodas 20 oz, coffee 1 cup occasionally    Enjoys being outside with family       DPR mom Hayden Pedro [phone]    Social Drivers of  Health   Financial Resource Strain: Medium Risk (06/27/2023)   [facility]   Overall Financial Resource Strain (CARDIA)    Difficulty of Paying Living Expenses: Somewhat hard  Food Insecurity: Food Insecurity Present (06/27/2023)   Received from [facility]   Hunger Vital Sign    Worried About Running Out of Food in the Last Year: Sometimes true    Ran Out of Food in the Last Year: Never true  Transportation Needs: No Transportation Needs (06/27/2023)   [facility] - Transportation    In the past 12 months, has lack of transportation kept you from medical appointments or from getting medications?: No    Lack of Transportation (Non-Medical): No  Physical Activity: Not on file  Stress: Not on file  Social Connections: Not on file  Intimate Partner Violence: Not on file    Past Medical History, Surgical history, Social history, and Family history were reviewed and updated as appropriate.   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   Please see review of systems for further details on the patient's review from today.   Objective:   Physical Exam:  LMP  (LMP Unknown)   Physical Exam Neurological:     Mental Status: She is alert and oriented to person, place, and time.     Cranial Nerves: No dysarthria.  Psychiatric:        Attention and Perception: Attention and perception normal.        Mood and Affect: Mood is anxious. Mood is not depressed.        Speech: Speech normal.        Behavior: Behavior is cooperative.        Thought Content: Thought content normal. Thought content is not paranoid or delusional. Thought content does not include homicidal or suicidal ideation. Thought content does not include suicidal plan.        Cognition and Memory: Cognition and memory normal.        Judgment: Judgment normal.     Comments: Insight intact Anxiety much worse     Lab Review:     Component Value Date/Time   NA 139 04/26/2021 1615   NA 140 05/24/2020 1701   NA 138  11/25/2014 2152   K 3.8 04/26/2021 1615   K 3.7 11/25/2014 2152   CL 106 04/26/2021 1615   CL 105 11/25/2014 2152   CO2 25 04/26/2021 1615   CO2 28 11/25/2014 2152   GLUCOSE 96 04/26/2021 1615   GLUCOSE 98 11/25/2014 2152   BUN 11 04/26/2021 1615   BUN 11 05/24/2020 1701   BUN 10 11/25/2014 2152   CREATININE 0.84 04/26/2021 1615   CREATININE 0.81 11/25/2014 2152   CALCIUM 10.1 04/26/2021 1615   CALCIUM 10.0 11/25/2014 2152   PROT 7.4 04/26/2021 1615   PROT 6.9 05/24/2020 1701   PROT 7.2 11/25/2014 2152   ALBUMIN 4.2 04/26/2021 1615   ALBUMIN 4.6 05/24/2020 1701   ALBUMIN 4.1 11/25/2014 2152   AST 19 04/26/2021 1615   AST 15 11/25/2014 2152   ALT 19 04/26/2021 1615   ALT 13 (L) 11/25/2014 2152   ALKPHOS 88 04/26/2021 1615   ALKPHOS 61 11/25/2014 2152   BILITOT 0.6 04/26/2021 1615   BILITOT 0.2 05/24/2020 1701   BILITOT 0.4 11/25/2014 2152   GFRNONAA >60 04/26/2021 1615   GFRNONAA >60 11/25/2014 2152   GFRAA 90 05/24/2020 1701   GFRAA >  60 11/25/2014 2152       Component Value Date/Time   WBC 4.5 04/26/2021 1615   RBC 4.70 04/26/2021 1615   HGB 14.0 04/26/2021 1615   HGB 13.9 06/14/2020 1625   HCT 40.4 04/26/2021 1615   HCT 41.7 06/14/2020 1625   PLT 298 04/26/2021 1615   PLT 367 06/14/2020 1625   MCV 86.0 04/26/2021 1615   MCV 86 06/14/2020 1625   MCV 77 (L) 11/25/2014 2152   MCH 29.8 04/26/2021 1615   MCHC 34.7 04/26/2021 1615   RDW 13.5 04/26/2021 1615   RDW 13.2 06/14/2020 1625   RDW 22.4 (H) 11/25/2014 2152   LYMPHSABS 2.3 05/24/2020 1701   LYMPHSABS 2.4 03/27/2013 0500   MONOABS 0.4 07/30/2018 1033   MONOABS 0.4 03/27/2013 0500   EOSABS 0.1 05/24/2020 1701   EOSABS 0.2 03/27/2013 0500   BASOSABS 0.0 05/24/2020 1701   BASOSABS 0.0 03/27/2013 0500    No results found for: "POCLITH", "LITHIUM"   No results found for: "PHENYTOIN", "PHENOBARB", "VALPROATE", "CBMZ"   .res Assessment: Plan:    Julieana was seen today for follow-up, depression and  anxiety.  Diagnoses and all orders for this visit:  Bipolar 1 disorder, mixed, moderate (HCC)  Generalized anxiety disorder  Panic disorder with agoraphobia  PTSD (post-traumatic stress disorder)  Chronic fatigue  B12 deficiency  Low vitamin D level     30 minutes of video-face to face time with patient was spent on counseling and coordination of care. We discussed Ceara has a history of bipolar disorder with heavy seasonal component .  l she has addressed the work stress and it is much better at this time.  Vraylar helped her mood but she could not afford it.  We switched to Abilify 10 mg which she has been on for several months and so far mood stability and depression are better and much calmer until her father's health crisis.  She is not having any side effects except some wt.  She has tried all non-antipsychotic mood stabilizers except CBZ which is similar to oxcarbazapine that has been inadequate. Disc her fears about antipsychotics in detail and she agrees. Discussed potential metabolic side effects associated with atypical antipsychotics, as well as potential risk for movement side effects. Advised pt to contact office if movement side effects occur.   Continue Abilify 10 mg daily.  Option increase if needed call.. it helped a lot. Continue Abilify 10 mg daily,  lamotrigine 200 oxcarbazepine back to 900 mg HS increase for anxiety to sertraline 100 mg daily.  Increase lorazepam 1 mg TID prn until sertraline starts helping.  Low B12 and vitamin D can contribute to psych problems and can be consequence of AEDs.  Counseling 20 min: supportive therapy and CPT around worsenign anxiety since father's hospitalization.  Also around work impairment and problem solving or work px related to her anxiety and family demands.  We discussed the short-term risks associated with benzodiazepines including sedation and increased fall risk among others.  Discussed long-term side effect  risk including dependence, potential withdrawal symptoms, and the potential eventual dose-related risk of dementia.  Disc risk dependence in detail in terms of the amount that can be used without it. Ativan less sedating than Xanax and helps. Prn panic  Get EKG with PCP on Friday appt. Bc F long QT on Abilify  FMLA: full leave from now until May 1, then have intermittent FMLA  1 day leave 4 times per month.    Call prn.  FU 2 mos  [name], MD, DFAPA    Please see After Visit Summary for patient specific instructions.  Future Appointments  Date Time Provider Department Center  11/23/2023  2:30 PM Ziglar, Eli Phillips, MD [facility] None     No orders of the defined types were placed in this encounter.      -------------------------------

## 2023-11-21 NOTE — Telephone Encounter (Signed)
 Contact pcp

## 2023-11-23 ENCOUNTER — Encounter: Payer: Self-pay | Admitting: Family Medicine

## 2023-11-23 ENCOUNTER — Ambulatory Visit: Admitting: Family Medicine

## 2023-11-23 VITALS — BP 141/92 | HR 74 | Temp 97.6°F | Resp 18 | Ht 64.0 in | Wt 205.0 lb

## 2023-11-23 DIAGNOSIS — K219 Gastro-esophageal reflux disease without esophagitis: Secondary | ICD-10-CM | POA: Diagnosis not present

## 2023-11-23 DIAGNOSIS — D125 Benign neoplasm of sigmoid colon: Secondary | ICD-10-CM | POA: Diagnosis not present

## 2023-11-23 DIAGNOSIS — I10 Essential (primary) hypertension: Secondary | ICD-10-CM

## 2023-11-23 DIAGNOSIS — Z1231 Encounter for screening mammogram for malignant neoplasm of breast: Secondary | ICD-10-CM | POA: Diagnosis not present

## 2023-11-23 MED ORDER — AMLODIPINE BESYLATE 5 MG PO TABS: 5.0000 mg | ORAL_TABLET | Freq: Every day | ORAL | 3 refills | Status: AC

## 2023-11-23 MED ORDER — PANTOPRAZOLE SODIUM 40 MG PO TBEC: 40.0000 mg | DELAYED_RELEASE_TABLET | Freq: Every day | ORAL | 3 refills | Status: AC

## 2023-11-23 NOTE — Assessment & Plan Note (Signed)
 On amlodipine and good control of her BP.  No significant side effects.

## 2023-11-23 NOTE — Assessment & Plan Note (Signed)
 Last mammogram was 02/19/2023. And it was normal.

## 2023-11-23 NOTE — Assessment & Plan Note (Signed)
 Needs refill of her pantoprazole.

## 2023-11-23 NOTE — Progress Notes (Signed)
 New Patient Office Visit  Subjective    [name], female    DOB: 08/25/1965  Age: 58 y.o. MRN: [id number]  CC:  Chief Complaint  Patient presents with   Medical Management of Chronic Issues   Gastroesophageal Reflux    HPI [name] [name] presents to establish care Delightful 58 yo woman with Bipolar D/O, HTN, GERD, PVCs, and anxiety (followed by Dr. Alcide Clever at [facility] in [place]), chronic thoracic back pain, s/p hysterectomy, s/p appendectomy, s/p tonsillectomy, s/p bilateral myringotomy tubes, s/p cervical spine surgery.   PHQ9 is 6 and GAD7 is 14.  She acknowledges that she is having more anxiety because her father has been in and out of the hospital since 08/13/24.  He has been diagnosed with prolonged QT interval.  Her psychiatrist asked her to get an EKG to check for this. She asks that I complete FMLA papers so she can take off from work to help her father.  He needs help with ADLs, IADLS and for transportation.   She is taking Norvasc for HTN and she does not experience any side effects fro this.  She has GERD and takes pantoprazole  40mg  daily.   She complains of being tired all of the time.  She snores but does not wake gasping for breath.  She sleeps about 6 hours.  She was screened for sleep apnea 3-4 years ago and was told she did not need CPAP.   Her fatigue could be from the added stress she has with her father in the hospital.   Outpatient Encounter Medications as of 11/23/2023  Medication Sig   ARIPiprazole (ABILIFY) 10 MG tablet TAKE 1 TABLET BY MOUTH DAILY   ibuprofen (ADVIL) 200 MG tablet Takes 1-3 tablets as needed   lamoTRIgine (LAMICTAL) 200 MG tablet TAKE 1 TABLET BY MOUTH AT  BEDTIME   LORazepam (ATIVAN) 1 MG tablet Take 1 tablet (1 mg total) by mouth every 8 (eight) hours as needed for anxiety.   Multiple Vitamin (MULTIVITAMIN WITH MINERALS) TABS tablet Take 1 tablet by mouth at bedtime.   Oxcarbazepine (TRILEPTAL) 300 MG tablet TAKE 3  TABLETS BY MOUTH EVERY  NIGHT AT BEDTIME   sertraline (ZOLOFT) 50 MG tablet Take 1 tablet (50 mg total) by mouth daily.   tiZANidine (ZANAFLEX) 4 MG tablet Take 1 tablet every 8 hours by oral route as needed.   [DISCONTINUED] amLODipine (NORVASC) 5 MG tablet Take 1 tablet (5 mg total) by mouth daily.   [DISCONTINUED] benzonatate (TESSALON) 100 MG capsule Take 1-2 tablets 3 times a day as needed for cough   [DISCONTINUED] cyclobenzaprine (FLEXERIL) 5 MG tablet Take 1 tablet (5 mg total) by mouth 3 (three) times daily as needed.   [DISCONTINUED] pantoprazole (PROTONIX) 40 MG tablet Take 1 tablet (40 mg total) by mouth daily.   [DISCONTINUED] promethazine-dextromethorphan (PROMETHAZINE-DM) 6.25-15 MG/5ML syrup Take 5 mLs by mouth at bedtime as needed.   amLODipine (NORVASC) 5 MG tablet Take 1 tablet (5 mg total) by mouth daily.   pantoprazole (PROTONIX) 40 MG tablet Take 1 tablet (40 mg total) by mouth daily.   No facility-administered encounter medications on file as of 11/23/2023.    Past Medical History:  Diagnosis Date   Anxiety    Arthritis    B12 deficiency    Chicken pox    Depression    Fibroid    GERD (gastroesophageal reflux disease)    History of hiatal hernia    Hypertension  Iron deficiency anemia    Menorrhagia    Migraines    PAF (paroxysmal atrial fibrillation) (HCC)    a. 04/2013 Echo: EF 55-60%, impaired relaxation. Mild MR/TR. Nl RV fxn; b. CHA2DS2VASc = 2; c. PRN flecainide.   Snores     Past Surgical History:  Procedure Laterality Date   ABDOMINAL HYSTERECTOMY     APPENDECTOMY  5/11   C-spine surgery     CESAREAN SECTION  10/98,9/12   COLONOSCOPY WITH PROPOFOL N/A 03/26/2019   Procedure: COLONOSCOPY WITH PROPOFOL;  Surgeon: Pasty Spillers, MD;  Location: [facility] END[facility]COPY;  Service: Endoscopy;  Laterality: N/A;   ESOPHAGOGASTRODUODENOSCOPY (EGD) WITH PROPOFOL N/A 03/26/2019   Procedure: ESOPHAGOGASTRODUODENOSCOPY (EGD) WITH PROPOFOL;  Surgeon: Pasty Spillers, MD;  Location: [facility];  Service: Endoscopy;  Laterality: N/A;   HYSTERECTOMY ABDOMINAL WITH SALPINGO-OOPHORECTOMY Bilateral 08/05/2018   Procedure: HYSTERECTOMY ABDOMINAL WITH BILATERAL SALPINGO-OOPHORECTOMY;  Surgeon: Herold Harms, MD;  Location: [facility] ORS;  Service: Gynecology;  Laterality: Bilateral;   TONSILLECTOMY  03/2008   TUBAL LIGATION      Family History  Problem Relation Age of Onset   Diabetes Mother    Alcohol abuse Father    Arthritis Father    Heart disease Father    Arthritis Sister    Alcohol abuse Maternal Aunt    Alcohol abuse Maternal Uncle    Alcohol abuse Paternal Grandfather    Lung cancer Paternal Grandfather        smoker    Arthritis Maternal Grandmother    Schizophrenia Maternal Grandmother    Leukemia Maternal Grandfather    Breast cancer Neg Hx     Social History   Socioeconomic History   Marital status: Legally Separated    Spouse name: Not on file   Number of children: Not on file   Years of education: Not on file   Highest education level: Not on file  Occupational History   Not on file  Tobacco Use   Smoking status: Former    Current packs/day: 0.00    Average packs/day: 1 pack/day for 10.0 years (10.0 ttl pk-yrs)    Types: Cigarettes    Start date: 08/21/1981    Quit date: 08/22/1991    Years since quitting: 32.2    Passive exposure: Past   Smokeless tobacco: Never  Vaping Use   Vaping status: Never Used  Substance and Sexual Activity   Alcohol use: Not Currently    Alcohol/week: 0.0 standard drinks of alcohol    Comment: twice a year    Drug use: Not Currently    Types: Marijuana    Comment: not for a couple years   Sexual activity: Yes    Partners: Male    Birth control/protection: Surgical    Comment: Husband  Other Topics Concern   Not on file  Social History Narrative   [employer]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    Lives with husband and 2 children    Son- 24 yo and daughter 22 yo    Pets: 2 dogs     Caffeine- 2 sodas 20 oz, coffee 1 cup occasionally    Enjoys being outside with family       DPR mom Hayden Pedro [phone]    Social Drivers of Health   Financial Resource Strain: Medium Risk (06/27/2023)   Received from [facility]   Overall Financial Resource Strain (CARDIA)    Difficulty of Paying Living Expenses: Somewhat hard  Food Insecurity:  Food Insecurity Present (06/27/2023)   Received from [facility]   Hunger Vital Sign    Worried About Running Out of Food in the Last Year: Sometimes true    Ran Out of Food in the Last Year: Never true  Transportation Needs: No Transportation Needs (06/27/2023)   Received from [facility] - Transportation    In the past 12 months, has lack of transportation kept you from medical appointments or from getting medications?: No    Lack of Transportation (Non-Medical): No  Physical Activity: Not on file  Stress: Not on file  Social Connections: Not on file  Intimate Partner Violence: Not on file    ROS      Objective   BP (!) 141/92 (BP Location: Left Arm, Patient Position: Sitting, Cuff Size: Normal)   Pulse 74   Temp 97.6 F (36.4 C) (Oral)   Resp 18   Ht 5\' 4"  (1.626 m)   Wt 205 lb (93 kg)   LMP  (LMP Unknown)   SpO2 95%   BMI 35.19 kg/m    Physical Exam Vitals and nursing note reviewed.  Constitutional:      Appearance: Normal appearance.  HENT:     Head: Normocephalic and atraumatic.  Eyes:     Conjunctiva/sclera: Conjunctivae normal.  Cardiovascular:     Rate and Rhythm: Normal rate and regular rhythm.  Pulmonary:     Effort: Pulmonary effort is normal.     Breath sounds: Normal breath sounds.  Musculoskeletal:     Right lower leg: No edema.     Left lower leg: No edema.  Skin:    General: Skin is warm and dry.  Neurological:     Mental Status: She is alert and oriented to person, place, and time.  Psychiatric:        Mood and  Affect: Mood normal.        Behavior: Behavior normal.        Thought Content: Thought content normal.        Judgment: Judgment normal.            The 10-year ASCVD risk score (Arnett DK, et al., 2019) is: 3%     Assessment & Plan:  Primary hypertension -     amLODIPine Besylate; Take 1 tablet (5 mg total) by mouth daily.  Dispense: 90 tablet; Refill: 3 -     EKG 12-Lead  Gastroesophageal reflux disease, unspecified whether esophagitis present Assessment & Plan: Needs refill of her pantoprazole.    Orders: -     Pantoprazole Sodium; Take 1 tablet (40 mg total) by mouth daily.  Dispense: 90 tablet; Refill: 3  Essential hypertension Assessment & Plan: On amlodipine and good control of her BP.  No significant side effects.     Adenomatous polyp of sigmoid colon Assessment & Plan: Is due colonoscopy August 2025.   Encounter for screening mammogram for malignant neoplasm of breast Assessment & Plan: Last mammogram was 02/19/2023. And it was normal.       Return in about 6 months (around 05/24/2024), or if symptoms worsen or fail to improve.   Alease Medina, MD

## 2023-11-23 NOTE — Assessment & Plan Note (Signed)
 Is due colonoscopy August 2025.

## 2023-11-30 ENCOUNTER — Telehealth: Payer: Self-pay | Admitting: Psychiatry

## 2023-11-30 DIAGNOSIS — Z0289 Encounter for other administrative examinations: Secondary | ICD-10-CM

## 2023-11-30 NOTE — Telephone Encounter (Signed)
 No form was received with the paperwork from [employer] on 11/13/23.  Cheryllynn came 4/9 to sign the release form to send records to [employer].  That request was faxed to HIM on 11/30/23 with notation that we never received the physicians statement form.

## 2023-11-30 NOTE — Telephone Encounter (Signed)
 Pt Lvm @ 2:42p asking if we had received the paperwork.  I called her back at 3:41p and read her the message in the encounter.  She said she was just on the phone with them and they will refax the paperwork to [place] again.  I advised pt to call back on Monday to see if we have received missing paperwork.  Next appt 6/17

## 2023-11-30 NOTE — Telephone Encounter (Signed)
 [employer] sent over an Attending Physician Statement for pt for disability leave. Given to nurse to complete.

## 2023-12-10 NOTE — Telephone Encounter (Signed)
 The first forms received from [facility] requesting Attending Provider Statement [employer] was faxed last week. The 2nd set of forms from [facility] Attending Provider Statement for Mental Health faxed today.

## 2023-12-17 ENCOUNTER — Telehealth: Payer: Self-pay | Admitting: Psychiatry

## 2023-12-17 NOTE — Telephone Encounter (Signed)
 New STD form received and given to [facility].

## 2023-12-18 NOTE — Telephone Encounter (Signed)
 Received additional form with repeat questions asked. Form completed. Dr. Toi Foster reviewed and signed. Faxed form per request. Will give to admin staff for office note request.

## 2024-01-07 ENCOUNTER — Other Ambulatory Visit: Payer: Self-pay | Admitting: Psychiatry

## 2024-01-07 DIAGNOSIS — F319 Bipolar disorder, unspecified: Secondary | ICD-10-CM

## 2024-01-30 ENCOUNTER — Other Ambulatory Visit: Payer: Self-pay | Admitting: Psychiatry

## 2024-01-30 DIAGNOSIS — F411 Generalized anxiety disorder: Secondary | ICD-10-CM

## 2024-01-30 DIAGNOSIS — F4310 Post-traumatic stress disorder, unspecified: Secondary | ICD-10-CM

## 2024-01-30 DIAGNOSIS — F40240 Claustrophobia: Secondary | ICD-10-CM

## 2024-01-30 DIAGNOSIS — F4001 Agoraphobia with panic disorder: Secondary | ICD-10-CM

## 2024-02-05 ENCOUNTER — Telehealth: Admitting: Psychiatry

## 2024-02-05 NOTE — Progress Notes (Signed)
 CA bc pt had to go to hospital for family

## 2024-02-12 ENCOUNTER — Telehealth: Payer: Self-pay | Admitting: Psychiatry

## 2024-02-12 ENCOUNTER — Other Ambulatory Visit: Payer: Self-pay

## 2024-02-12 DIAGNOSIS — F4001 Agoraphobia with panic disorder: Secondary | ICD-10-CM

## 2024-02-12 MED ORDER — LORAZEPAM 1 MG PO TABS: 1.0000 mg | ORAL_TABLET | Freq: Three times a day (TID) | ORAL | 2 refills | Status: DC | PRN

## 2024-02-12 NOTE — Telephone Encounter (Signed)
 Pended lorazepam  1 mg

## 2024-02-12 NOTE — Telephone Encounter (Signed)
 Pt rs her appt and also asked for a refill on her lorazapam 1 mg. Pharmacy is [facility] on [address] in [place]

## 2024-02-13 ENCOUNTER — Other Ambulatory Visit: Payer: Self-pay | Admitting: Psychiatry

## 2024-02-13 DIAGNOSIS — F3162 Bipolar disorder, current episode mixed, moderate: Secondary | ICD-10-CM

## 2024-04-01 ENCOUNTER — Other Ambulatory Visit: Payer: Self-pay | Admitting: Psychiatry

## 2024-04-01 DIAGNOSIS — F319 Bipolar disorder, unspecified: Secondary | ICD-10-CM

## 2024-04-09 ENCOUNTER — Encounter: Payer: Self-pay | Admitting: Emergency Medicine

## 2024-04-09 ENCOUNTER — Ambulatory Visit
Admission: EM | Admit: 2024-04-09 | Discharge: 2024-04-09 | Disposition: A | Discharge status: Home / Self Care | Attending: Emergency Medicine | Admitting: Emergency Medicine

## 2024-04-09 DIAGNOSIS — R21 Rash and other nonspecific skin eruption: Secondary | ICD-10-CM

## 2024-04-09 MED ORDER — DEXAMETHASONE SODIUM PHOSPHATE 10 MG/ML IJ SOLN
10.0000 mg | Freq: Once | INTRAMUSCULAR | Status: AC
  Administered 2024-04-09: 10 mg via INTRAMUSCULAR

## 2024-04-09 MED ORDER — PREDNISONE 10 MG (21) PO TBPK: ORAL_TABLET | Freq: Every day | ORAL | 0 refills | Status: DC

## 2024-04-09 NOTE — Discharge Instructions (Signed)
 Today you are being treated for the p rash that appears inflammatory, no signs of infection  You have been given an injection of steroids today in the office today to help reduce the inflammatory process that occurs with this rash which will help minimize your itching as well as begin to clear  Starting tomorrow take prednisone  every morning with food as directed, to continue the above process  You may continue use of topical calamine or Benadryl cream to help manage itching, you may also continue oral Benadryl  Please avoid long exposures to heat such as a hot steamy shower or being outside as this may cause further irritation to your rash  You may follow-up with his urgent care as needed if symptoms persist or worsen

## 2024-04-09 NOTE — ED Provider Notes (Signed)
 [name]    CSN: [id number] Arrival date & time: 04/09/24  1734      History   Chief Complaint Chief Complaint  Patient presents with   Rash    HPI [name] [name] is a 58 y.o. female.   Patient presents for rash started the chest progressively spreading starting 2 days ago.  Rash is erythematous and described as a burning sensation.  Son has similar symptoms believed to be possible poison ivy or oak from her cat.  Denies dietary, medication or toiletry changes.  Denies drainage or fever.  Has attempted use of hydrocortisone.  Endorses that she has a similar rash that occurs at least 1-2 times a year requiring steroid treatment.  Past Medical History:  Diagnosis Date   Anxiety    Arthritis    B12 deficiency    Chicken pox    Depression    Fibroid    GERD (gastroesophageal reflux disease)    History of hiatal hernia    Hypertension    Iron deficiency anemia    Menorrhagia    Migraines    PAF (paroxysmal atrial fibrillation) (HCC)    a. 04/2013 Echo: EF 55-60%, impaired relaxation. Mild MR/TR. Nl RV fxn; b. CHA2DS2VASc = 2; c. PRN flecainide .   Snores     Patient Active Problem List   Diagnosis Date Noted   Essential hypertension 06/27/2023   Bipolar 1 disorder (HCC) 06/27/2023   Gastric polyp    Screening for breast cancer    Polyp of sigmoid colon    Diverticulosis of large intestine without diverticulitis    HLD (hyperlipidemia) 03/12/2019   Vitamin D  deficiency 03/12/2019   Pure hypertriglyceridemia 02/27/2019   Menopause 02/27/2019   S/P total hysterectomy and BSO (bilateral salpingo-oophorectomy) 08/05/2018   B12 deficiency 06/04/2018   PTSD (post-traumatic stress disorder) 05/14/2018   HSV (herpes simplex virus) infection 11/27/2017   Anemia 03/08/2017   HNP (herniated nucleus pulposus), cervical 07/29/2015   GERD (gastroesophageal reflux disease) 12/10/2014   Migraines 12/10/2014    Past Surgical History:  Procedure Laterality Date    ABDOMINAL HYSTERECTOMY     APPENDECTOMY  5/11   C-spine surgery     CESAREAN SECTION  10/98,9/12   COLONOSCOPY WITH PROPOFOL  N/A 03/26/2019   Procedure: COLONOSCOPY WITH PROPOFOL ;  Surgeon: Janalyn Keene NOVAK, MD;  Location: [facility] END[facility];  Service: Endoscopy;  Laterality: N/A;   ESOPHAGOGASTRODUODENOSCOPY (EGD) WITH PROPOFOL  N/A 03/26/2019   Procedure: ESOPHAGOGASTRODUODENOSCOPY (EGD) WITH PROPOFOL ;  Surgeon: Janalyn Keene NOVAK, MD;  Location: [facility] END[facility];  Service: Endoscopy;  Laterality: N/A;   HYSTERECTOMY ABDOMINAL WITH SALPINGO-OOPHORECTOMY Bilateral 08/05/2018   Procedure: HYSTERECTOMY ABDOMINAL WITH BILATERAL SALPINGO-OOPHORECTOMY;  Surgeon: Kathe Gladis LABOR, MD;  Location: [facility] ORS;  Service: Gynecology;  Laterality: Bilateral;   TONSILLECTOMY  03/2008   TUBAL LIGATION      OB History     Gravida  5   Para  3   Term  2   Preterm  1   AB  2   Living  2      SAB      IAB  2   Ectopic      Multiple      Live Births  2        Obstetric Comments  1st Menstrual Cycle:  16 1st Pregnancy:  17          Home Medications    Prior to Admission medications   Medication Sig Start Date End Date  Taking? Authorizing Provider  predniSONE  (STERAPRED UNI-PAK 21 TAB) 10 MG (21) TBPK tablet Take by mouth daily. Take 6 tabs by mouth daily  for 1 days, then 5 tabs for 1 days, then 4 tabs for 1 days, then 3 tabs for 1 days, 2 tabs for 1 days, then 1 tab by mouth daily for 1 days 04/09/24  Yes Starlee Corralejo R, NP  amLODipine  (NORVASC ) 5 MG tablet Take 1 tablet (5 mg total) by mouth daily. 11/23/23   Ziglar, Susan K, MD  ARIPiprazole  (ABILIFY ) 10 MG tablet TAKE 1 TABLET BY MOUTH DAILY 02/15/24   Cottle, Lorene KANDICE Raddle., MD  ibuprofen  (ADVIL ) 200 MG tablet Takes 1-3 tablets as needed    [provider]  lamoTRIgine  (LAMICTAL ) 200 MG tablet TAKE 1 TABLET BY MOUTH AT  BEDTIME 08/08/23   Cottle, Lorene KANDICE Raddle., MD  LORazepam  (ATIVAN ) 1 MG tablet Take 1 tablet (1 mg  total) by mouth every 8 (eight) hours as needed for anxiety. 02/12/24   Cottle, Lorene KANDICE Raddle., MD  Multiple Vitamin (MULTIVITAMIN WITH MINERALS) TABS tablet Take 1 tablet by mouth at bedtime.    [provider]  Oxcarbazepine  (TRILEPTAL ) 300 MG tablet TAKE 3 TABLETS BY MOUTH EVERY  NIGHT AT BEDTIME 04/02/24   Cottle, Lorene KANDICE Raddle., MD  pantoprazole  (PROTONIX ) 40 MG tablet Take 1 tablet (40 mg total) by mouth daily. 11/23/23   Ziglar, Susan K, MD  sertraline  (ZOLOFT ) 50 MG tablet Take 1 tablet (50 mg total) by mouth daily. 08/07/23   Cottle, Lorene KANDICE Raddle., MD  tiZANidine (ZANAFLEX) 4 MG tablet Take 1 tablet every 8 hours by oral route as needed. 04/11/21   [provider]    Family History Family History  Problem Relation Age of Onset   Diabetes Mother    Alcohol abuse Father    Arthritis Father    Heart disease Father    Arthritis Sister    Alcohol abuse Maternal Aunt    Alcohol abuse Maternal Uncle    Alcohol abuse Paternal Grandfather    Lung cancer Paternal Grandfather        smoker    Arthritis Maternal Grandmother    Schizophrenia Maternal Grandmother    Leukemia Maternal Grandfather    Breast cancer Neg Hx     Social History Social History   Tobacco Use   Smoking status: Former    Current packs/day: 0.00    Average packs/day: 1 pack/day for 10.0 years (10.0 ttl pk-yrs)    Types: Cigarettes    Start date: 08/21/1981    Quit date: 08/22/1991    Years since quitting: 32.6    Passive exposure: Past   Smokeless tobacco: Never  Vaping Use   Vaping status: Never Used  Substance Use Topics   Alcohol use: Not Currently    Alcohol/week: 0.0 standard drinks of alcohol    Comment: twice a year    Drug use: Not Currently    Types: Marijuana    Comment: not for a couple years     Allergies   Phenylmercuric nitrate, Augmentin [amoxicillin-pot clavulanate], Codeine, Diclofenac, Latex, Neomycin, and Percocet [oxycodone-acetaminophen ]   Review of Systems Review of  Systems   Physical Exam Triage Vital Signs ED Triage Vitals  Encounter Vitals Group     BP 04/09/24 1757 124/80     Girls Systolic BP Percentile --      Girls Diastolic BP Percentile --      Boys Systolic BP Percentile --  Boys Diastolic BP Percentile --      Pulse Rate 04/09/24 1757 74     Resp 04/09/24 1757 18     Temp 04/09/24 1757 98.2 F (36.8 C)     Temp Source 04/09/24 1757 Oral     SpO2 04/09/24 1757 98 %     Weight --      Height --      Head Circumference --      Peak Flow --      Pain Score 04/09/24 1755 0     Pain Loc --      Pain Education --      Exclude from Growth Chart --    No data found.  Updated Vital Signs BP 124/80 (BP Location: Left Arm)   Pulse 74   Temp 98.2 F (36.8 C) (Oral)   Resp 18   LMP  (LMP Unknown)   SpO2 98%   Visual Acuity Right Eye Distance:   Left Eye Distance:   Bilateral Distance:    Right Eye Near:   Left Eye Near:    Bilateral Near:     Physical Exam Constitutional:      Appearance: Normal appearance.  Eyes:     Extraocular Movements: Extraocular movements intact.  Pulmonary:     Effort: Pulmonary effort is normal.  Neurological:     Mental Status: She is alert and oriented to person, place, and time. Mental status is at baseline.      UC Treatments / Results  Labs (all labs ordered are listed, but only abnormal results are displayed) Labs Reviewed - No data to display  EKG   Radiology No results found.  Procedures Procedures (including critical care time)  Medications Ordered in UC Medications  dexamethasone  (DECADRON ) injection 10 mg (10 mg Intramuscular Given 04/09/24 1815)    Initial Impression / Assessment and Plan / UC Course  I have reviewed the triage vital signs and the nursing notes.  Pertinent labs & imaging results that were available during my care of the patient were reviewed by me and considered in my medical decision making (see chart for details).  Rash  Appears  inflammatory, no signs of infection, has another contact with similar symptoms, Decadron  IM given and prescribed oral prednisone  for home use recommended nonpharmacological and over-the-counter medications for management of pruritus advised against long exposure to heat to prevent further irritation, advise follow-up with urgent care or primary doctor if symptoms persist worsen or recur  Final Clinical Impressions(s) / UC Diagnoses   Final diagnoses:  Rash and nonspecific skin eruption     Discharge Instructions      Today you are being treated for the p rash that appears inflammatory, no signs of infection  You have been given an injection of steroids today in the office today to help reduce the inflammatory process that occurs with this rash which will help minimize your itching as well as begin to clear  Starting tomorrow take prednisone  every morning with food as directed, to continue the above process  You may continue use of topical calamine or Benadryl cream to help manage itching, you may also continue oral Benadryl  Please avoid long exposures to heat such as a hot steamy shower or being outside as this may cause further irritation to your rash  You may follow-up with his urgent care as needed if symptoms persist or worsen    ED Prescriptions     Medication Sig Dispense Auth. Provider   predniSONE  (  STERAPRED UNI-PAK 21 TAB) 10 MG (21) TBPK tablet Take by mouth daily. Take 6 tabs by mouth daily  for 1 days, then 5 tabs for 1 days, then 4 tabs for 1 days, then 3 tabs for 1 days, 2 tabs for 1 days, then 1 tab by mouth daily for 1 days 21 tablet Dashanae Longfield, Shelba SAUNDERS, NP      PDMP not reviewed this encounter.   Teresa Shelba SAUNDERS, [place] 04/09/24 [phone]

## 2024-04-09 NOTE — ED Triage Notes (Signed)
 Patient reports red itchy rash on neck that spreading down to chest x 2 days. Patient has used hydrocortisone cream with no relief.

## 2024-04-11 ENCOUNTER — Other Ambulatory Visit: Payer: Self-pay | Admitting: Psychiatry

## 2024-04-11 DIAGNOSIS — F40240 Claustrophobia: Secondary | ICD-10-CM

## 2024-04-11 DIAGNOSIS — F4310 Post-traumatic stress disorder, unspecified: Secondary | ICD-10-CM

## 2024-04-11 DIAGNOSIS — F4001 Agoraphobia with panic disorder: Secondary | ICD-10-CM

## 2024-04-11 DIAGNOSIS — F411 Generalized anxiety disorder: Secondary | ICD-10-CM

## 2024-04-13 MED ORDER — SERTRALINE HCL 100 MG PO TABS: 100.0000 mg | ORAL_TABLET | Freq: Every day | ORAL | 0 refills | Status: DC

## 2024-04-17 ENCOUNTER — Telehealth: Admitting: Psychiatry

## 2024-04-17 ENCOUNTER — Encounter: Payer: Self-pay | Admitting: Psychiatry

## 2024-04-17 DIAGNOSIS — F3162 Bipolar disorder, current episode mixed, moderate: Secondary | ICD-10-CM

## 2024-04-17 DIAGNOSIS — R5382 Chronic fatigue, unspecified: Secondary | ICD-10-CM

## 2024-04-17 DIAGNOSIS — F411 Generalized anxiety disorder: Secondary | ICD-10-CM

## 2024-04-17 DIAGNOSIS — F4310 Post-traumatic stress disorder, unspecified: Secondary | ICD-10-CM

## 2024-04-17 DIAGNOSIS — E538 Deficiency of other specified B group vitamins: Secondary | ICD-10-CM

## 2024-04-17 DIAGNOSIS — F40240 Claustrophobia: Secondary | ICD-10-CM | POA: Diagnosis not present

## 2024-04-17 DIAGNOSIS — F319 Bipolar disorder, unspecified: Secondary | ICD-10-CM

## 2024-04-17 DIAGNOSIS — F4001 Agoraphobia with panic disorder: Secondary | ICD-10-CM

## 2024-04-17 DIAGNOSIS — R7989 Other specified abnormal findings of blood chemistry: Secondary | ICD-10-CM

## 2024-04-17 MED ORDER — SERTRALINE HCL 100 MG PO TABS: 100.0000 mg | ORAL_TABLET | Freq: Every day | ORAL | 0 refills | Status: DC

## 2024-04-17 MED ORDER — ARIPIPRAZOLE 10 MG PO TABS: 10.0000 mg | ORAL_TABLET | Freq: Every day | ORAL | 1 refills | Status: DC

## 2024-04-17 NOTE — Progress Notes (Signed)
 [name] [name] [id number] Oct 18, 1965 58 y.o.   Video Visit via My Chart  I connected with pt by video using My Chart and verified that I am speaking with the correct person using two identifiers.   I discussed the limitations, risks, security and privacy concerns of performing an evaluation and management service by My Chart  and the availability of in person appointments. I also discussed with the patient that there may be a patient responsible charge related to this service. The patient expressed understanding and agreed to proceed.  I discussed the assessment and treatment plan with the patient. The patient was provided an opportunity to ask questions and all were answered. The patient agreed with the plan and demonstrated an understanding of the instructions.   The patient was advised to call back or seek an in-person evaluation if the symptoms worsen or if the condition fails to improve as anticipated.  I provided 30 minutes of video time during this encounter.  The patient was located at home and the provider was located office. Session 100-130  Subjective:   Patient ID:  [name] [name] is a 58 y.o. (DOB Feb 21, 1966) female.  Chief Complaint:  Chief Complaint  Patient presents with   Follow-up   Depression   Anxiety    Depression        Associated symptoms include fatigue.  Associated symptoms include no decreased concentration, no headaches and no suicidal ideas.  Past medical history includes anxiety.   Anxiety Symptoms include nervous/anxious behavior and palpitations. Patient reports no confusion, decreased concentration, dizziness or suicidal ideas.     [name] [name] presents to the office today for follow-up of several dxes.  At visit  January 2020.  She had recently been diagnosed with low vitamin B12.  She was initiating treatment.  No meds were changed.  visit July 2020.  her anxiety was higher after reducing sertraline .  Therefore it was increased back to 100  mg daily.  06/2020 appt with the following noted: Cardiologist said she had chronic tx for afib and PVC's and it acts up more when she's under more stress.  Cardiologist suggested Bz when having the episodes.  Better at present but has periods of them daily.  They last an hour or more.  Rarely takes clonazepam , but it helps in 30 mins.  Tolerates clonazepam  well otherwise. She felt increase sertraline  helped some with anxiety.  It is better.  More irritable last couple of weeks than anxious unless heart episode.  Missed a couple of weeks of hormones which might have caused this. Panic attacks from 0-2 weekly is typical but episodes where she may have them daily for a couple of weeks associated with the palpitations as noted. Plan: DC clonazepam  Start lorazepam  1 mg every 8 hours as needed anxiety attacks.  Call back if it is less effective than clonazepam  or less well-tolerated.  04/06/20 appt with the following noted: Real stressed out and feels everything caving in on her.  Work, home and everything.  D broke her leg and ex H had to stay with them. Supervisor stress and short-staffed.  Doesn't want the vaccine. Only one RX lorazepam .  It helped.  But afraid of addiction.  Tends to isolate weekends bc stress. Consistent with oxcarb 900 mg HS, sertraline  100, lamotrigine  200. Wanting to stay up late and then has EMA.   Not hyper, never.  Is irritable.   ExH's family gives her a hard time and drags D into it. Plan no  med changes:  10/05/2020 appointment with the following noted: Some anger and anxiety but probably situational over the Covid situation and dealing with work situation.  Wakes that way a lot of times.  Irritable.  Walked out of work one day.  In the background all the time but will come out with triggers.  Controls it usually.  No panic. Plan: Increase Trileptal  to 300 mg AM & 900 mg PM.   10/20/20 TC Rtc to patient and she reports feeling overwhelmed and stressed at work causing  increased anxiety. She reports going off on her boss Friday. She reports there is someone at work going around telling everyone that patient is bipolar. [name]. She doesn't know how she would know. [name]. I asked if this was discussed at her last visit on 10/05/2020 and she said no. We discussed what she is thinking she needs, she really wants to see what Dr. Geoffry thinks and get his opinion. She reports she thinks she needs 2 weeks off to get to feeling better and remove herself from work for right now. Then intermittent from that time. Informed her that is something he would probably do but I would discuss with him. OK'd  FMLA  11/23/20 TC: Message from staff: Addendum to previous messages. Next appt is 11/29/20 at 1:30. She has called back and said that she is having trouble at work with her co-workers. She wants to check into a hospital but she told me she didn't have any suicide thoughts. She is under a lot of stress at work. Her best friend told someone at her employer that she has bipolar and co-workers are giving her a hard time because of the diagnosis. She said that she is still taking her meds. Her blood pressure when taken a couple of days ago was 184/101 and 148/88. She would like to be pointed in the right direction of what she needs to do? Her phone number is [phone]. She would like to speak to someone before her visit on Monday.   MD TC with pt:  Work stress with boss spreading info that she's bipolar.  Getting knit picked at work.  Did this to another friend who had bipolar disorder. Works [employer].  Talked to HR yesterday.  Get a final warning writeup and feels it is unjust.  Working there since 2017 and all started in last 2 mos.    Was doing fine until this all started.  Feels discriminated against.  No SI.  Will see her Monday and start FMLA for a couple of weeks and we'll make med  changes. She agrees to the plan.  Lorene Geoffry, MD, DFAPA  11/29/20 appt noted: Worked today.  Got new supervisor who's cleaning house.  She feels picked on.  This has made her mad and she has expressed anger at work and that's gotten her written up. I feel on edge all the time.  Also overwhelmed at home and problems with daughter as well.  Problems keeping things straight at home. More anger and irritability.   Patient denies difficulty with sleep initiation or maintenance. Denies appetite disturbance.  Patient reports that energy and motivation have been good.  She still has complaints of concentration and forgetfulness as previously noted.  Patient denies any suicidal ideation.  Still forgetful.   01/27/21 appt noted: Feels she's being mistreated at work bc she's bipolar.  Plans to get  a new job.  Trying to get house ready to sale but D and son won't help with it.  Needed a leave from work.  Had panic attack last week thinking of going there.  Cried at work when there the last day. She is more down as well as agitated and angry.  She has difficulty react really relaxing.  She easily panics.  She has difficulty concentrating.  She has feelings of hopelessness and helplessness.  No suicidal thoughts She has not tried the Vraylar  as suggested so far but agrees to do so. Plan: Start Vraylar  1.5 mg daily and if it works will wean other meds. Agree with medical leave until August 1   02/23/2021 appointment with the following noted: Disability approved until 02/28/21 but requested until August 1. Doesn't feel capable of RTW due to panic over thoughts of it.  Feels people are talking about her negatively at work.  Too anxious being around coworkers. Did not start Vraylar  bc scared of antipsychotics. Sx continue as noted above. HA and GI problems better not at work. Anxiety about work causes these sx and her BP to go up Plan: Start Vraylar  1.5 mg daily and if it works will wean other meds.   She agrees  this time to take it.  Call if you change your mind or don't tolerate it. Agree with extending medical leave until August 8 due to depression, mood swings, anger outbursts, crying spells that interfere with ability to do her work and reduced concentration.   03/23/2021 phone call from patient:Patient lm to cancel appt on 8/5 stating if she was doing well per Dr Geoffry she didn't need it. She rescheduled for October next available. Patient will need a Rx for the Vraylar  for samples were given at her last visit. 04/18/2021 phone call from patient:Pt left a message that the vraylar  is to expensive and she will not be taking this medicine. Sent prescription for Abilify  10 mg in place of Vraylar   05/31/2021 appointment with the following noted: Insurance would not pay for Vraylar . Abilify  10 mg worked pretty well for 5 and 1/2 weeks.  No SE now. Once accidentally took 20 mg and got dizzy with NV. Seen mood benefit with less depression and less irritable. Had argument with mother and threatened OD and IVC spent 12 hours in hospital after OD of 5 mg lorazepam  but wasn't trying to kill herself but was really mad at mother in a rage.  Doesn't get violent but mother triggers her. Boss now leaving her alone since her leave.  She's had no work performance problems.  She has talked to boss and it went well.  He's backed off micromanagment. No SE.  Eating less.  Happier. Plan: Continue Abilify  10 mg daily.  Option increase if needed call..  07/19/2021 phone call: Patient complaining that she felt shaky and restless on Abilify  10 mg for several weeks.  She was offered the option to reduce the dose.  08/11/2021 appointment with the following noted: For awhile had restless legs around knees but gone now. Family says she's doing better on it, not as irritable.  Not as on edge.   Still on Abilify  10 mg daily, lamotrigine  200, oxcarbazepine  900 mg HS, sertraline  100 mg daily. Not really depressed generally.  Sleep  is ok but awakens early to go to bathroom.  At least 7 hours. Good sleeper.   No bouts SI since here.   Good work function. SE none unless weight gain. Plan: Continue Abilify   10 mg daily.  Option increase if needed call.. Continue Abilify  10 mg daily, lamotrigine  200, oxcarbazepine  900 mg HS,  Reduce sertraline  75 mg daily to see if can lose weight.  11/07/2021 appointment with the following noted: Reduced sertraline  to 50 mg for 3 weeks. Doing ok.  Cut to 25 mg daily but too irritable.  Manageable on 50 mg daily. Anxiety about the same and OK with benefit with Ativan .  Life is stressful.   Ok with depression.  Calmer on Abilify .  But used to chaos and it seems bored.   Satisfied with meds. Tired all the time.  Sleep is good.  Alsways been able to sleep a lot. D is ADD and she questions Being ADD. IN: Continue Abilify  10 mg daily.  Option increase if needed call.. Continue Abilify  10 mg daily, lamotrigine  200, oxcarbazepine  900 mg HS,  Reduce sertraline  50 mg daily to see if can lose weight. OK trial modafinil  for poss ADD and chronic fatigue 100-200mg  am .  Disc [facility]  04/25/2022 appointment noted: Never got modafinil  DT cost Fine with reduced sertraline . Anxiety is not worse and mood is better.  Don't let stuff bother me as much as I used to. Still has a lot of chronic anxiety. Feels better with Abilify .  Less obs thoughts and less irritable and not yelling.  07/27/22 appt noted; No problems with reduction oxcarb.  No SE change. Anxiety is a little worse and thinks it comes from work.  Not feel anxious in her mind but her body reacts with sweating and N.  Anx not worse in other situations.  Gets racing thoughts more now, repetitive.   Modafinil  didn't help fatigue or alterness and caused SE insomnia. Otherwise sleeps well.  8 hr work days and 10 h weekends. M and son say she has better mood with Abilify . Plan: Continue Abilify  10 mg daily.  Option increase if needed call.. it helped  a lot. Continue Abilify  10 mg daily, lamotrigine  200 More anxious with less so increase oxcarbazepine  bcakc to 900 mg HS sertraline  50 mg daily.  Consider increase for anxiety  11/16/22 appt noted: Not much lorazepam  Anxiety better with increase Trileptal  900 mg HS, Abilify  10, sertraline  50, lamotrigine  200,  A lot calmer with Abilify  and mother notices.  No anger swings. Sleep pretty good No panic attacks. No sig SE.  Dizzy if forgets meds. Easy job but very stressful dealing with management Was told she had low b12, still working on getting it up.  Getting shots. Plan: Continue Abilify  10 mg daily, lamotrigine  200 Better anxious  oxcarbazepine  back to 900 mg HS sertraline  50 mg daily.  Consider increase for anxiety  05/22/23 appt noted: Meds as above:  lorazepam  1 mg prn stress.  Others No sig SE unless Abilify  wt gain .  Gained 15#/3 mos Likes the benefit from Abilify . Pretty good mood overall.  No panic.  Normal mood . Calmest I've been in a long time Asks me to refill Protonix  which works bc hard to get into her doctorl.   Pleased with meds. Plan no med changes: No med changes Continue Abilify  10 mg daily.  Option increase if needed call.. it helped a lot. Continue Abilify  10 mg daily, lamotrigine  200 Better anxious  oxcarbazepine  back to 900 mg HS sertraline  50 mg daily.  Consider increase for anxiety  11/21/23 appt noted: video Meds:  Abilify  10, lamotrigine  200 HS, oxcarbazepine  900 HS, sertraline  50.  Lorazepam  1 mg BID-TID A lot more anxiety.  Needing more lorazepam .  F in hosp since DEC and will likely be coming to live with her.  Put in for FMLA.  DT anxiety and caring for father.  Wants a month off from work for anxiety.  No time to be depressed .  Can't sleep .  Feel like crawling out of her skin.   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  Would like May 1 RTW.   EMA  with anxiety. Feels nervous and shakey a lot.  N.  Not eating well.  Having trouble functioning.  Worse anxiety for a couple of months.  Thought he was dying 3 different times. Her anxiety was ok and managed until F hosp Xmas eve.  Anxiety gradually worse.  Like PTSD sort of. F delirium, pneumonia and ventilator twice.   Plan: Continue Abilify  10 mg daily,  lamotrigine  200 oxcarbazepine  back to 900 mg HS increase for anxiety to sertraline  100 mg daily.  Increase lorazepam  1 mg TID prn until sertraline  starts helping.  04/17/24 appt noted:  Meds:  Abilify  10, lamotrigine  200 HS, oxcarbazepine  900 HS, sertraline  100.  Lorazepam  1 mg BID-TID Co rash on chest .  Fine itchy.  But likely contact dermatitis.  Took 100 mg sertraline  for awhile then rF old 35 , but wants to increase back to 100 for anxiety.  Taking 50 most of time since appt.  More anxious and shakes some with anxiety.  Likely stress with father causing it. Having to deal with father's estate stuff.  He's in NH. F bipolar on Abilify  and helps.  Feels butterflies in stomach. No SE except Lorazepam  mild sleepiness.  About one every other day. Real micromanager at work increases everyone's anxiety. Sleep pretty well.     hysterectomy for endometriosis and normally takes a patch distributing hormones though she is recently been out and it worsened her mood when she was not taking it..  Worried about hormones affecting mood.  Hx bad PMS.  Prior psychiatric medication trials include  sertraline ,  Depakote,  lithium, lamotrigine , Trileptal  900, Saphris, risperidone, Vraylar  1.5 worked but $, Abilify  10 Clonazepam .  Xanax SE, lorazepam  1. Modafinil  NR and SE ins  F psych sx better with Abilify   Review of Systems:  Review of Systems  Constitutional:  Positive for fatigue. Negative for fever.  HENT:  Positive for hearing loss.   Cardiovascular:  Positive for palpitations.  Neurological:  Negative for dizziness, tremors and headaches.   Psychiatric/Behavioral:  Positive for dysphoric mood. Negative for agitation, behavioral problems, confusion, decreased concentration, hallucinations, self-injury, sleep disturbance and suicidal ideas. The patient is nervous/anxious. The patient is not hyperactive.     Medications: I have reviewed the patient's current medications.  Current Outpatient Medications  Medication Sig Dispense Refill   amLODipine  (NORVASC ) 5 MG tablet Take 1 tablet (5 mg total) by mouth daily. 90 tablet 3   ibuprofen  (ADVIL ) 200 MG tablet Takes 1-3 tablets as needed     lamoTRIgine  (LAMICTAL ) 200 MG tablet TAKE 1 TABLET BY MOUTH AT  BEDTIME 90 tablet 3   LORazepam  (ATIVAN ) 1 MG tablet Take 1 tablet (1 mg total) by mouth every 8 (eight) hours as needed for anxiety. 30 tablet 2   Multiple Vitamin (MULTIVITAMIN WITH MINERALS) TABS tablet Take 1 tablet by mouth at bedtime.     Oxcarbazepine  (TRILEPTAL ) 300 MG tablet TAKE 3 TABLETS BY MOUTH EVERY  NIGHT AT BEDTIME 270 tablet 0   pantoprazole  (PROTONIX ) 40 MG tablet Take 1 tablet (40 mg total) by mouth daily. 90 tablet 3   predniSONE  (STERAPRED UNI-PAK 21 TAB) 10 MG (21) TBPK tablet Take by mouth daily. Take 6 tabs by mouth daily  for 1 days, then 5 tabs for 1 days, then 4 tabs for 1 days, then 3 tabs for 1 days, 2 tabs for 1 days, then 1 tab by mouth daily for 1 days 21 tablet 0   tiZANidine (ZANAFLEX) 4 MG tablet Take 1 tablet every 8 hours by oral route as needed.     ARIPiprazole  (ABILIFY ) 10 MG tablet Take 1 tablet (10 mg total) by mouth daily. 90 tablet 1   sertraline  (ZOLOFT ) 100 MG tablet Take 1 tablet (100 mg total) by mouth daily. 90 tablet 0   No current facility-administered medications for this visit.    Medication Side Effects: None  Allergies:  Allergies  Allergen Reactions   Phenylmercuric Nitrate Rash   Augmentin [Amoxicillin-Pot Clavulanate] Nausea Only    Has patient had a PCN reaction causing immediate rash, facial/tongue/throat swelling, SOB  or lightheadedness with hypotension: No Has patient had a PCN reaction causing severe rash involving mucus membranes or skin necrosis: No Has patient had a PCN reaction that required hospitalization: No Has patient had a PCN reaction occurring within the last 10 years: Yes If all of the above answers are NO, then may proceed with Cephalosporin use.    Codeine Nausea Only   Diclofenac Nausea Only   Latex Rash   Neomycin Rash   Percocet [Oxycodone-Acetaminophen ] Rash    Hives     Past Medical History:  Diagnosis Date   Anxiety    Arthritis    B12 deficiency    Chicken pox    Depression    Fibroid    GERD (gastroesophageal reflux disease)    History of hiatal hernia    Hypertension    Iron deficiency anemia    Menorrhagia    Migraines    PAF (paroxysmal atrial fibrillation) (HCC)    a. 04/2013 Echo: EF 55-60%, impaired relaxation. Mild MR/TR. Nl RV fxn; b. CHA2DS2VASc = 2; c. PRN flecainide .   Snores     Family History  Problem Relation Age of Onset   Diabetes Mother    Alcohol abuse Father    Arthritis Father    Heart disease Father    Arthritis Sister    Alcohol abuse Maternal Aunt    Alcohol abuse Maternal Uncle    Alcohol abuse Paternal Grandfather    Lung cancer Paternal Grandfather        smoker    Arthritis Maternal Grandmother    Schizophrenia Maternal Grandmother    Leukemia Maternal Grandfather    Breast cancer Neg Hx     Social History   Socioeconomic History   Marital status: Legally Separated    Spouse name: Not on file   Number of children: Not on file   Years of education: Not on file   Highest education level: Not on file  Occupational History   Not on file  Tobacco Use   Smoking status: Former    Current packs/day: 0.00    Average packs/day: 1 pack/day for 10.0 years (10.0 ttl pk-yrs)    Types: Cigarettes    Start date: 08/21/1981    Quit date: 08/22/1991    Years since quitting: 32.6    Passive exposure: Past   Smokeless tobacco:  Never  Vaping Use   Vaping status: Never Used  Substance and Sexual Activity   Alcohol use: Not Currently    Alcohol/week: 0.0 standard drinks of alcohol    Comment: twice a year    Drug use: Not Currently    Types: Marijuana    Comment: not for a couple years   Sexual activity: Yes    Partners: Male    Birth control/protection: Surgical    Comment: Husband  Other Topics Concern   Not on file  Social History Narrative   [facility]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    Lives with husband and 2 children    Son- 50 yo and daughter 65 yo    Pets: 2 dogs    Caffeine- 2 sodas 20 oz, coffee 1 cup occasionally    Enjoys being outside with family       DPR mom Tawni Coaster [phone]    Social Drivers of Health   Financial Resource Strain: Medium Risk (06/27/2023)   Received from [facility]   Overall Financial Resource Strain (CARDIA)    Difficulty of Paying Living Expenses: Somewhat hard  Food Insecurity: Food Insecurity Present (06/27/2023)   Received from [facility]   Hunger Vital Sign    Within the past 12 months, you worried that your food would run out before you got the money to buy more.: Sometimes true    Within the past 12 months, the food you bought just didn't last and you didn't have money to get more.: Never true  Transportation Needs: No Transportation Needs (06/27/2023)   Received from [facility] - Transportation    In the past 12 months, has lack of transportation kept you from medical appointments or from getting medications?: No    Lack of Transportation (Non-Medical): No  Physical Activity: Not on file  Stress: Not on file  Social Connections: Not on file  Intimate Partner Violence: Not on file    Past Medical History, Surgical history, Social history, and Family history were reviewed and updated as appropriate.   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   Please see review of systems  for further details on the patient's review from today.   Objective:   Physical Exam:  LMP  (LMP Unknown)   Physical Exam Neurological:     Mental Status: She is alert and oriented to person, place, and time.     Cranial Nerves: No dysarthria.  Psychiatric:        Attention and Perception: Attention and perception normal.        Mood and Affect: Mood is anxious. Mood is not depressed.        Speech: Speech normal.        Behavior: Behavior is cooperative.        Thought Content: Thought content normal. Thought content is not paranoid or delusional. Thought content does not include homicidal or suicidal ideation. Thought content does not include suicidal plan.        Cognition and Memory: Cognition and memory normal.        Judgment: Judgment normal.     Comments: Insight intact Anxiety much worse     Lab Review:     Component Value Date/Time   NA 139 04/26/2021 1615   NA 140 05/24/2020 1701   NA 138 11/25/2014 2152   K 3.8 04/26/2021 1615   K 3.7 11/25/2014 2152   CL 106 04/26/2021  1615   CL 105 11/25/2014 2152   CO2 25 04/26/2021 1615   CO2 28 11/25/2014 2152   GLUCOSE 96 04/26/2021 1615   GLUCOSE 98 11/25/2014 2152   BUN 11 04/26/2021 1615   BUN 11 05/24/2020 1701   BUN 10 11/25/2014 2152   CREATININE 0.84 04/26/2021 1615   CREATININE 0.81 11/25/2014 2152   CALCIUM 10.1 04/26/2021 1615   CALCIUM 10.0 11/25/2014 2152   PROT 7.4 04/26/2021 1615   PROT 6.9 05/24/2020 1701   PROT 7.2 11/25/2014 2152   ALBUMIN 4.2 04/26/2021 1615   ALBUMIN 4.6 05/24/2020 1701   ALBUMIN 4.1 11/25/2014 2152   AST 19 04/26/2021 1615   AST 15 11/25/2014 2152   ALT 19 04/26/2021 1615   ALT 13 (L) 11/25/2014 2152   ALKPHOS 88 04/26/2021 1615   ALKPHOS 61 11/25/2014 2152   BILITOT 0.6 04/26/2021 1615   BILITOT 0.2 05/24/2020 1701   BILITOT 0.4 11/25/2014 2152   GFRNONAA >60 04/26/2021 1615   GFRNONAA >60 11/25/2014 2152   GFRAA 90 05/24/2020 1701   GFRAA >60 11/25/2014 2152        Component Value Date/Time   WBC 4.5 04/26/2021 1615   RBC 4.70 04/26/2021 1615   HGB 14.0 04/26/2021 1615   HGB 13.9 06/14/2020 1625   HCT 40.4 04/26/2021 1615   HCT 41.7 06/14/2020 1625   PLT 298 04/26/2021 1615   PLT 367 06/14/2020 1625   MCV 86.0 04/26/2021 1615   MCV 86 06/14/2020 1625   MCV 77 (L) 11/25/2014 2152   MCH 29.8 04/26/2021 1615   MCHC 34.7 04/26/2021 1615   RDW 13.5 04/26/2021 1615   RDW 13.2 06/14/2020 1625   RDW 22.4 (H) 11/25/2014 2152   LYMPHSABS 2.3 05/24/2020 1701   LYMPHSABS 2.4 03/27/2013 0500   MONOABS 0.4 07/30/2018 1033   MONOABS 0.4 03/27/2013 0500   EOSABS 0.1 05/24/2020 1701   EOSABS 0.2 03/27/2013 0500   BASOSABS 0.0 05/24/2020 1701   BASOSABS 0.0 03/27/2013 0500    No results found for: POCLITH, LITHIUM   No results found for: PHENYTOIN, PHENOBARB, VALPROATE, CBMZ   .res Assessment: Plan:    Jamira was seen today for follow-up, depression and anxiety.  Diagnoses and all orders for this visit:  Generalized anxiety disorder -     sertraline  (ZOLOFT ) 100 MG tablet; Take 1 tablet (100 mg total) by mouth daily.  Panic disorder with agoraphobia -     sertraline  (ZOLOFT ) 100 MG tablet; Take 1 tablet (100 mg total) by mouth daily.  PTSD (post-traumatic stress disorder) -     sertraline  (ZOLOFT ) 100 MG tablet; Take 1 tablet (100 mg total) by mouth daily.  Bipolar I disorder with seasonal pattern (HCC)  Claustrophobia  Chronic fatigue  B12 deficiency  Low vitamin D  level  Bipolar 1 disorder, mixed, moderate (HCC) -     ARIPiprazole  (ABILIFY ) 10 MG tablet; Take 1 tablet (10 mg total) by mouth daily.   30 minutes of video-face to face time with patient was spent on counseling and coordination of care. We discussed Cortny has a history of bipolar disorder with heavy seasonal component .    Vraylar  helped her mood but she could not afford it.  We switched to Abilify  10 mg which she has been on for several months and so  far mood stability and depression are better and much calmer until her father's health crisis.  She is not having any side effects except some wt.  She has tried  all non-antipsychotic mood stabilizers except CBZ which is similar to oxcarbazapine that has been inadequate. Discussed potential metabolic side effects associated with atypical antipsychotics, as well as potential risk for movement side effects. Advised pt to contact office if movement side effects occur.   Continue Abilify  10 mg daily.  Option increase if needed call.. it helped a lot. Continue Abilify  10 mg daily,  lamotrigine  200 oxcarbazepine  back to 900 mg HS increase for anxiety to sertraline  100 mg daily.   lorazepam  1 mg TID prn until sertraline  starts helping.  Low B12 and vitamin D  can contribute to psych problems and can be consequence of AEDs.  We discussed the short-term risks associated with benzodiazepines including sedation and increased fall risk among others.  Discussed long-term side effect risk including dependence, potential withdrawal symptoms, and the potential eventual dose-related risk of dementia.  But recent studies from 2020 dispute this association between benzodiazepines and dementia risk. Newer studies in 2020 do not support an association with dementia. Disc risk dependence in detail in terms of the amount that can be used without it. Ativan  less sedating than Xanax and helps. Prn panic  Get EKG with PCP on Friday appt. Bc F long QT on Abilify   FMLA:  ntermittent FMLA  1 day leave 4 times per month bc: F in hosp since DEC and then [facility] Ph[facility].    Call prn.  FU 2 -3 mos  Lorene Macintosh, MD, DFAPA    Please see After Visit Summary for patient specific instructions.  No future appointments.    No orders of the defined types were placed in this encounter.      -------------------------------

## 2024-04-18 ENCOUNTER — Ambulatory Visit
Admission: EM | Admit: 2024-04-18 | Discharge: 2024-04-18 | Disposition: A | Discharge status: Home / Self Care | Attending: Emergency Medicine | Admitting: Emergency Medicine

## 2024-04-18 ENCOUNTER — Encounter: Payer: Self-pay | Admitting: Emergency Medicine

## 2024-04-18 DIAGNOSIS — R21 Rash and other nonspecific skin eruption: Secondary | ICD-10-CM | POA: Diagnosis not present

## 2024-04-18 MED ORDER — DEXAMETHASONE SODIUM PHOSPHATE 10 MG/ML IJ SOLN
10.0000 mg | Freq: Once | INTRAMUSCULAR | Status: AC
  Administered 2024-04-18: 10 mg via INTRAMUSCULAR

## 2024-04-18 MED ORDER — PREDNISONE 10 MG (21) PO TBPK: ORAL_TABLET | Freq: Every day | ORAL | 0 refills | Status: DC

## 2024-04-18 NOTE — Discharge Instructions (Addendum)
 Today you are being treated for the p rash that appears inflammatory, no signs of infection   You have been given an injection of steroids today in the office today to help reduce the inflammatory process that occurs with this rash which will help minimize your itching as well as begin to clear   Starting tomorrow take prednisone  every morning with food as directed, to continue the above process   You may continue use of topical calamine or Benadryl cream to help manage itching, you may also continue oral Benadryl   Please avoid long exposures to heat such as a hot steamy shower or being outside as this may cause further irritation to your rash  If your symptoms continue to persist please follow-up with dermatologist for further evaluation

## 2024-04-18 NOTE — ED Triage Notes (Signed)
 Patient reports rash x 4 days.

## 2024-04-18 NOTE — ED Provider Notes (Signed)
 [name]    CSN: [id number] Arrival date & time: 04/18/24  1712      History   Chief Complaint Chief Complaint  Patient presents with   Rash    HPI [name] [name] is a 58 y.o. female.    Patient presents for rash started the chest progressively spreading starting 11 days ago.  Rash is erythematous and described as a burning sensation.  Has progressively worsened becoming more prominent at and spreading up to the chin.  Feels more inflamed.  Son has similar symptoms believed to be possible poison ivy or oak from her cat.  Denies dietary, medication or toiletry changes.  Denies drainage or fever.  Has attempted use of hydrocortisone.  Endorses that she has a similar rash that occurs at least 1-2 times a year requiring steroid treatment.  Was given an injection of steroids and prescribed taper during last visit on 04/09/2024, endorses that she lost the pill bottle and did not complete medicine.   Past Medical History:  Diagnosis Date   Anxiety    Arthritis    B12 deficiency    Chicken pox    Depression    Fibroid    GERD (gastroesophageal reflux disease)    History of hiatal hernia    Hypertension    Iron deficiency anemia    Menorrhagia    Migraines    PAF (paroxysmal atrial fibrillation) (HCC)    a. 04/2013 Echo: EF 55-60%, impaired relaxation. Mild MR/TR. Nl RV fxn; b. CHA2DS2VASc = 2; c. PRN flecainide .   Snores     Patient Active Problem List   Diagnosis Date Noted   Essential hypertension 06/27/2023   Bipolar 1 disorder (HCC) 06/27/2023   Gastric polyp    Screening for breast cancer    Polyp of sigmoid colon    Diverticulosis of large intestine without diverticulitis    HLD (hyperlipidemia) 03/12/2019   Vitamin D  deficiency 03/12/2019   Pure hypertriglyceridemia 02/27/2019   Menopause 02/27/2019   S/P total hysterectomy and BSO (bilateral salpingo-oophorectomy) 08/05/2018   B12 deficiency 06/04/2018   PTSD (post-traumatic stress disorder)  05/14/2018   HSV (herpes simplex virus) infection 11/27/2017   Anemia 03/08/2017   HNP (herniated nucleus pulposus), cervical 07/29/2015   GERD (gastroesophageal reflux disease) 12/10/2014   Migraines 12/10/2014    Past Surgical History:  Procedure Laterality Date   ABDOMINAL HYSTERECTOMY     APPENDECTOMY  5/11   C-spine surgery     CESAREAN SECTION  10/98,9/12   COLONOSCOPY WITH PROPOFOL  N/A 03/26/2019   Procedure: COLONOSCOPY WITH PROPOFOL ;  Surgeon: Janalyn Keene NOVAK, MD;  Location: [facility];  Service: Endoscopy;  Laterality: N/A;   ESOPHAGOGASTRODUODENOSCOPY (EGD) WITH PROPOFOL  N/A 03/26/2019   Procedure: ESOPHAGOGASTRODUODENOSCOPY (EGD) WITH PROPOFOL ;  Surgeon: Janalyn Keene NOVAK, MD;  Location: [facility];  Service: Endoscopy;  Laterality: N/A;   HYSTERECTOMY ABDOMINAL WITH SALPINGO-OOPHORECTOMY Bilateral 08/05/2018   Procedure: HYSTERECTOMY ABDOMINAL WITH BILATERAL SALPINGO-OOPHORECTOMY;  Surgeon: Kathe Gladis LABOR, MD;  Location: [facility];  Service: Gynecology;  Laterality: Bilateral;   TONSILLECTOMY  03/2008   TUBAL LIGATION      OB History     Gravida  5   Para  3   Term  2   Preterm  1   AB  2   Living  2      SAB      IAB  2   Ectopic      Multiple      Live Births  2  Obstetric Comments  1st Menstrual Cycle:  16 1st Pregnancy:  17          Home Medications    Prior to Admission medications   Medication Sig Start Date End Date Taking? Authorizing Provider  predniSONE  (STERAPRED UNI-PAK 21 TAB) 10 MG (21) TBPK tablet Take by mouth daily. Take 6 tabs by mouth daily  for 2 days, then 5 tabs for 2 days, then 4 tabs for 2 days, then 3 tabs for 2 days, 2 tabs for 2 days, then 1 tab by mouth daily for 2 days 04/18/24  Yes Dazia Lippold R, NP  amLODipine  (NORVASC ) 5 MG tablet Take 1 tablet (5 mg total) by mouth daily. 11/23/23   Ziglar, Susan K, MD  ARIPiprazole  (ABILIFY ) 10 MG tablet Take 1 tablet (10 mg total) by mouth daily.  04/17/24   Cottle, Lorene KANDICE Raddle., MD  ibuprofen  (ADVIL ) 200 MG tablet Takes 1-3 tablets as needed    [provider]  lamoTRIgine  (LAMICTAL ) 200 MG tablet TAKE 1 TABLET BY MOUTH AT  BEDTIME 08/08/23   Cottle, Lorene KANDICE Raddle., MD  LORazepam  (ATIVAN ) 1 MG tablet Take 1 tablet (1 mg total) by mouth every 8 (eight) hours as needed for anxiety. 02/12/24   Cottle, Lorene KANDICE Raddle., MD  Multiple Vitamin (MULTIVITAMIN WITH MINERALS) TABS tablet Take 1 tablet by mouth at bedtime.    [provider]  Oxcarbazepine  (TRILEPTAL ) 300 MG tablet TAKE 3 TABLETS BY MOUTH EVERY  NIGHT AT BEDTIME 04/02/24   Cottle, Lorene KANDICE Raddle., MD  pantoprazole  (PROTONIX ) 40 MG tablet Take 1 tablet (40 mg total) by mouth daily. 11/23/23   Ziglar, Susan K, MD  sertraline  (ZOLOFT ) 100 MG tablet Take 1 tablet (100 mg total) by mouth daily. 04/17/24   Cottle, Lorene KANDICE Raddle., MD  tiZANidine (ZANAFLEX) 4 MG tablet Take 1 tablet every 8 hours by oral route as needed. 04/11/21   [provider]    Family History Family History  Problem Relation Age of Onset   Diabetes Mother    Alcohol abuse Father    Arthritis Father    Heart disease Father    Arthritis Sister    Alcohol abuse Maternal Aunt    Alcohol abuse Maternal Uncle    Alcohol abuse Paternal Grandfather    Lung cancer Paternal Grandfather        smoker    Arthritis Maternal Grandmother    Schizophrenia Maternal Grandmother    Leukemia Maternal Grandfather    Breast cancer Neg Hx     Social History Social History   Tobacco Use   Smoking status: Former    Current packs/day: 0.00    Average packs/day: 1 pack/day for 10.0 years (10.0 ttl pk-yrs)    Types: Cigarettes    Start date: 08/21/1981    Quit date: 08/22/1991    Years since quitting: 32.6    Passive exposure: Past   Smokeless tobacco: Never  Vaping Use   Vaping status: Never Used  Substance Use Topics   Alcohol use: Not Currently    Alcohol/week: 0.0 standard drinks of alcohol    Comment: twice a year     Drug use: Not Currently    Types: Marijuana    Comment: not for a couple years     Allergies   Phenylmercuric nitrate, Augmentin [amoxicillin-pot clavulanate], Codeine, Diclofenac, Latex, Neomycin, and Percocet [oxycodone-acetaminophen ]   Review of Systems Review of Systems  Skin:  Positive for rash.     Physical Exam  Triage Vital Signs ED Triage Vitals  Encounter Vitals Group     BP --      Girls Systolic BP Percentile --      Girls Diastolic BP Percentile --      Boys Systolic BP Percentile --      Boys Diastolic BP Percentile --      Pulse Rate 04/18/24 1806 72     Resp 04/18/24 1806 18     Temp 04/18/24 1806 98 F (36.7 C)     Temp Source 04/18/24 1806 Oral     SpO2 04/18/24 1806 98 %     Weight --      Height --      Head Circumference --      Peak Flow --      Pain Score 04/18/24 1757 0     Pain Loc --      Pain Education --      Exclude from Growth Chart --    No data found.  Updated Vital Signs Pulse 72   Temp 98 F (36.7 C) (Oral)   Resp 18   LMP  (LMP Unknown)   SpO2 98%   Visual Acuity Right Eye Distance:   Left Eye Distance:   Bilateral Distance:    Right Eye Near:   Left Eye Near:    Bilateral Near:     Physical Exam Constitutional:      Appearance: Normal appearance.  Eyes:     Extraocular Movements: Extraocular movements intact.  Pulmonary:     Effort: Pulmonary effort is normal.  Skin:    Comments: Refer to photo taken on 04/09/2024, same rash but is more erythematous and inflamed  Neurological:     Mental Status: She is alert and oriented to person, place, and time. Mental status is at baseline.      UC Treatments / Results  Labs (all labs ordered are listed, but only abnormal results are displayed) Labs Reviewed - No data to display  EKG   Radiology No results found.  Procedures Procedures (including critical care time)  Medications Ordered in UC Medications  dexamethasone  (DECADRON ) injection 10 mg (10 mg  Intramuscular Given 04/18/24 1803)    Initial Impression / Assessment and Plan / UC Course  I have reviewed the triage vital signs and the nursing notes.  Pertinent labs & imaging results that were available during my care of the patient were reviewed by me and considered in my medical decision making (see chart for details).  Rash  No signs of infection, still appears to be inflammatory with unknown etiology, as patient did not complete medication as directed will reattempt, Decadron  IM given and prescribed prednisone  as rash is more inflamed and extended for 6 to 12 days, discussed administration recommended nonpharmacological supportive care and given walker referral to dermatology Final Clinical Impressions(s) / UC Diagnoses   Final diagnoses:  Rash and nonspecific skin eruption     Discharge Instructions      Today you are being treated for the p rash that appears inflammatory, no signs of infection   You have been given an injection of steroids today in the office today to help reduce the inflammatory process that occurs with this rash which will help minimize your itching as well as begin to clear   Starting tomorrow take prednisone  every morning with food as directed, to continue the above process   You may continue use of topical calamine or Benadryl cream to help manage itching, you  may also continue oral Benadryl   Please avoid long exposures to heat such as a hot steamy shower or being outside as this may cause further irritation to your rash  If your symptoms continue to persist please follow-up with dermatologist for further evaluation   ED Prescriptions     Medication Sig Dispense Auth. Provider   predniSONE  (STERAPRED UNI-PAK 21 TAB) 10 MG (21) TBPK tablet Take by mouth daily. Take 6 tabs by mouth daily  for 2 days, then 5 tabs for 2 days, then 4 tabs for 2 days, then 3 tabs for 2 days, 2 tabs for 2 days, then 1 tab by mouth daily for 2 days 42 tablet Dayelin Balducci,  Shelba SAUNDERS, NP      PDMP not reviewed this encounter.   Teresa Shelba SAUNDERS, NP 04/18/24 [phone]

## 2024-05-06 NOTE — Telephone Encounter (Unsigned)
 Copied from CRM [id number]. Topic: General - Other >> May 06, 2024  2:10 PM [name] [name] wrote: Reason for CRM: pt wants to know if PCP would be willing to complete paperwork for FMLA regarding her father (pt) she wants to fax the paperwork. Just wants to know if she is willing to do this again for her. Please advise   Best contact: [phone]

## 2024-05-08 NOTE — Telephone Encounter (Unsigned)
 Copied from CRM [id number]. Topic: Clinical - Medical Advice >> May 08, 2024  2:01 PM Anairis L wrote: Reason for CRM: Calling following up approval of FMLA paperwork     Best contact: [phone]

## 2024-05-09 ENCOUNTER — Telehealth: Payer: Self-pay | Admitting: Family Medicine

## 2024-05-09 NOTE — Telephone Encounter (Signed)
 Advised that I cannot complete FMLA paperwork because I have not seen her father in months and he has no upcoming appointment.  The provider at the facility will have to complete this form

## 2024-05-12 NOTE — Telephone Encounter (Unsigned)
 Copied from CRM [id number]. Topic: Clinical - Medical Advice >> May 08, 2024  2:01 PM Anairis L wrote: Reason for CRM: Calling following up approval of FMLA paperwork     Best contact: [phone] >> May 09, 2024  3:53 PM Emylou G wrote: Patient called returned Dr Ziglar call?  Did she need anything?

## 2024-05-26 ENCOUNTER — Ambulatory Visit: Admitting: Family Medicine

## 2024-05-26 ENCOUNTER — Encounter: Payer: Self-pay | Admitting: Family Medicine

## 2024-05-26 VITALS — BP 164/96 | HR 64 | Temp 98.1°F | Resp 18 | Ht 64.0 in | Wt 205.0 lb

## 2024-05-26 DIAGNOSIS — R790 Abnormal level of blood mineral: Secondary | ICD-10-CM

## 2024-05-26 DIAGNOSIS — M546 Pain in thoracic spine: Secondary | ICD-10-CM

## 2024-05-26 DIAGNOSIS — E538 Deficiency of other specified B group vitamins: Secondary | ICD-10-CM | POA: Diagnosis not present

## 2024-05-26 DIAGNOSIS — E559 Vitamin D deficiency, unspecified: Secondary | ICD-10-CM | POA: Diagnosis not present

## 2024-05-26 DIAGNOSIS — Z23 Encounter for immunization: Secondary | ICD-10-CM

## 2024-05-26 DIAGNOSIS — Z1231 Encounter for screening mammogram for malignant neoplasm of breast: Secondary | ICD-10-CM

## 2024-05-26 DIAGNOSIS — I10 Essential (primary) hypertension: Secondary | ICD-10-CM | POA: Diagnosis not present

## 2024-05-26 DIAGNOSIS — G8929 Other chronic pain: Secondary | ICD-10-CM

## 2024-05-26 MED ORDER — ZEPBOUND 2.5 MG/0.5ML ~~LOC~~ SOAJ: 2.5000 mg | SUBCUTANEOUS | 0 refills | Status: AC

## 2024-05-26 NOTE — Progress Notes (Signed)
 Established Patient Office Visit  Subjective   [name], female    DOB: [birth year]  Age: 58 y.o. MRN: [id number]  Chief Complaint  Patient presents with   Medical Management of Chronic Issues   Back Pain    At least a yr    HPI [name] with bipolar 1 disorder/PTSD (followed by psychiatry) HTN, gastric polyp, GERD, mixed hyperlipidemia, HSV, migraine headaches, vitamin D  deficiency and s/p hysterectomy, Reports she is doing well.  Follows with her psychiatrist and is on Abilify  10 mg, Lamictal  200 mg, Ativan  1 mg, Trileptal  300 mg and Zoloft  100 mg daily.  She feels pretty good. She was worked up for cardiology, they thought she had atrial fibrillation but now have decided not and that she has PVCs.  She is not on a statIn and she does take an 81 mg aspirin daily.  She had an A1c that was 5.7% back in June.   She is complaining of thoracic back pain that is bilateral at about the level of her bra goes all the way across her back.She complains of lower thoracic pain at about the level of her bra strap.  It hurts on both sides and all the way across.  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  She is not aware of anything that makes her back pain better or worse.  She has not taken anything for it.  She has noticed this over the last several weeks.  She has no trauma that she is aware of.    She has had a total hysterectomy and did not take estrogen supplement. She thinks she has got a vitamin D  deficiency and a B12 deficiency.  She takes B12 over-the-counter and is interested in getting on B12 shots.  Her body mass index is 35+.  She is interested in a GLP-1 for weight loss.  No one in her family has had medullary thyroid cancer nor has she ever heard of MEN 2.   She occasionally smokes maybe a pack a week and does not buy cigarettes but bums them.  She has no EtOH and does not use drugs. She request to have  her magnesium level checked.   ROS    Objective:     BP (!) 164/96 (BP Location: Left Arm, Patient Position: Sitting, Cuff Size: Normal)   Pulse 64   Temp 98.1 F (36.7 C) (Oral)   Resp 18   Ht 5' 4 (1.626 m)   Wt 205 lb (93 kg)   LMP  (LMP Unknown)   SpO2 97%   BMI 35.19 kg/m    Physical Exam Vitals and nursing note reviewed.  Constitutional:      Appearance: Normal appearance.  HENT:     Head: Normocephalic and atraumatic.  Eyes:     Conjunctiva/sclera: Conjunctivae normal.  Cardiovascular:     Rate and Rhythm: Normal rate and regular rhythm.  Pulmonary:     Effort: Pulmonary effort is normal.     Breath sounds: Normal breath sounds.  Musculoskeletal:     Right lower leg: No edema.     Left lower leg: No edema.  Skin:    General: Skin is warm and dry.  Neurological:     Mental Status: She is alert and oriented to person, place, and time.  Psychiatric:        Mood and Affect: Mood normal.  Behavior: Behavior normal.        Thought Content: Thought content normal.        Judgment: Judgment normal.          No results found for any visits on 05/26/24.    The 10-year ASCVD risk score (Arnett DK, et al., 2019) is: 4.1%   Assessment and Plan:   Immunization due -     Flu vaccine trivalent PF, 6mos and older(Flulaval,Afluria,Fluarix,Fluzone)  Primary hypertension -     CBC with Differential/Platelet -     Comprehensive metabolic panel with GFR -     Lipid panel -     TSH -     T4, free  Vitamin B12 deficiency -     Vitamin B12; Future  Vitamin D  deficiency -     VITAMIN D  25 Hydroxy (Vit-D Deficiency, Fractures)  Low magnesium level -     Magnesium  Chronic thoracic back pain, unspecified back pain laterality -     DG Thoracic Spine 2 View; Future  Morbid obesity (HCC) Assessment & Plan: Her BMI is 35+.  Discussed the side effect profiles of GLP-1's predominantly nausea and constipation.  There is no family history of medullary  thyroid cancer and she has never heard of MEN 2.  Ask her to reduce the carbohydrates and sugars in her diet and to increase protein in each meal.  Will check on her in a month.  Orders: -     Zepbound; Inject 2.5 mg into the skin once a week.  Dispense: 2 mL; Refill: 0  Screening mammogram for breast cancer -     3D Screening Mammogram, Left and Right; Future                 Return in about 4 weeks (around 06/23/2024).    Esty Ahuja K Kennth Vanbenschoten, MD

## 2024-05-26 NOTE — Assessment & Plan Note (Signed)
 Her BMI is 35+.  Discussed the side effect profiles of GLP-1's predominantly nausea and constipation.  There is no family history of medullary thyroid cancer and she has never heard of MEN 2.  Ask her to reduce the carbohydrates and sugars in her diet and to increase protein in each meal.  Will check on her in a month.

## 2024-05-27 ENCOUNTER — Ambulatory Visit: Payer: Self-pay | Admitting: Family Medicine

## 2024-05-27 LAB — LIPID PANEL
Chol/HDL Ratio: 3.3 ratio (ref 0.0–4.4)
Cholesterol, Total: 217 mg/dL — ABNORMAL HIGH (ref 100–199)
HDL: 66 mg/dL (ref >39)
LDL Chol Calc (NIH): 131 mg/dL — ABNORMAL HIGH (ref 0–99)
Triglycerides: 116 mg/dL (ref 0–149)
VLDL Cholesterol Cal: 20 mg/dL (ref 5–40)

## 2024-05-27 LAB — COMPREHENSIVE METABOLIC PANEL WITH GFR
ALT: 19 IU/L (ref 0–32)
AST: 15 IU/L (ref 0–40)
Albumin: 4.3 g/dL (ref 3.8–4.9)
Alkaline Phosphatase: 104 IU/L (ref 49–135)
BUN/Creatinine Ratio: 11 (ref 9–23)
BUN: 9 mg/dL (ref 6–24)
Bilirubin Total: 0.2 mg/dL (ref 0.0–1.2)
CO2: 25 mmol/L (ref 20–29)
Calcium: 10.1 mg/dL (ref 8.7–10.2)
Chloride: 107 mmol/L — ABNORMAL HIGH (ref 96–106)
Creatinine, Ser: 0.8 mg/dL (ref 0.57–1.00)
Globulin, Total: 2.3 g/dL (ref 1.5–4.5)
Glucose: 98 mg/dL (ref 70–99)
Potassium: 4.2 mmol/L (ref 3.5–5.2)
Sodium: 144 mmol/L (ref 134–144)
Total Protein: 6.6 g/dL (ref 6.0–8.5)
eGFR: 86 mL/min/1.73 (ref >59)

## 2024-05-27 LAB — CBC WITH DIFFERENTIAL/PLATELET
Basophils Absolute: 0 x10E3/uL (ref 0.0–0.2)
Basos: 1 %
EOS (ABSOLUTE): 0.1 x10E3/uL (ref 0.0–0.4)
Eos: 3 %
Hematocrit: 41.5 % (ref 34.0–46.6)
Hemoglobin: 13.8 g/dL (ref 11.1–15.9)
Immature Grans (Abs): 0 x10E3/uL (ref 0.0–0.1)
Immature Granulocytes: 0 %
Lymphocytes Absolute: 1.8 x10E3/uL (ref 0.7–3.1)
Lymphs: 42 %
MCH: 30.1 pg (ref 26.6–33.0)
MCHC: 33.3 g/dL (ref 31.5–35.7)
MCV: 90 fL (ref 79–97)
Monocytes Absolute: 0.4 x10E3/uL (ref 0.1–0.9)
Monocytes: 10 %
Neutrophils Absolute: 1.9 x10E3/uL (ref 1.4–7.0)
Neutrophils: 43 %
Platelets: 315 x10E3/uL (ref 150–450)
RBC: 4.59 x10E6/uL (ref 3.77–5.28)
RDW: 12.9 % (ref 11.7–15.4)
WBC: 4.2 x10E3/uL (ref 3.4–10.8)

## 2024-05-27 LAB — MAGNESIUM: Magnesium: 2.1 mg/dL (ref 1.6–2.3)

## 2024-05-27 LAB — VITAMIN D 25 HYDROXY (VIT D DEFICIENCY, FRACTURES): Vit D, 25-Hydroxy: 18.9 ng/mL — ABNORMAL LOW (ref 30.0–100.0)

## 2024-05-27 LAB — TSH: TSH: 1.21 u[IU]/mL (ref 0.450–4.500)

## 2024-06-18 ENCOUNTER — Telehealth: Payer: Self-pay

## 2024-06-18 ENCOUNTER — Encounter: Payer: Self-pay | Admitting: Family Medicine

## 2024-06-18 ENCOUNTER — Other Ambulatory Visit: Payer: Self-pay | Admitting: Family Medicine

## 2024-06-18 MED ORDER — LIRAGLUTIDE -WEIGHT MANAGEMENT 18 MG/3ML ~~LOC~~ SOPN: PEN_INJECTOR | SUBCUTANEOUS | 0 refills | Status: AC

## 2024-06-18 NOTE — Telephone Encounter (Signed)
 Fax from [employer] that Zepbound is not a preferred formulary. Preferred formularies are Liraglutide, Phentermine-Topiramate caps, Diethylpropion tab.

## 2024-06-25 ENCOUNTER — Telehealth: Payer: Self-pay | Admitting: Pharmacy Technician

## 2024-06-25 ENCOUNTER — Other Ambulatory Visit (HOSPITAL_COMMUNITY): Payer: Self-pay

## 2024-06-25 NOTE — Telephone Encounter (Signed)
 Pharmacy Patient Advocate Encounter  Received notification from [facility] that Prior Authorization for Zepbound 2.5MG /0.5ML pen-injectors has been APPROVED from 06/25/2024 to 06/25/2025. Ran test claim, Copay is $24.99. This test claim was processed through [facility]- copay amounts may vary at other pharmacies due to pharmacy/plan contracts, or as the patient moves through the different stages of their insurance plan.   PA #/Case ID/Reference #: [id number]

## 2024-06-25 NOTE — Telephone Encounter (Signed)
 Pharmacy Patient Advocate Encounter   Received notification from [employer]base that prior authorization for Zepbound 2.5MG /0.5ML pen-injectors is required/requested.   Insurance verification completed.   The patient is insured through [facility]   Per test claim: PA required; PA submitted to above mentioned insurance via Latent Key/confirmation #/EOC [facility] Status is pending

## 2024-06-28 ENCOUNTER — Other Ambulatory Visit: Payer: Self-pay | Admitting: Psychiatry

## 2024-06-28 DIAGNOSIS — F411 Generalized anxiety disorder: Secondary | ICD-10-CM

## 2024-06-28 DIAGNOSIS — F4310 Post-traumatic stress disorder, unspecified: Secondary | ICD-10-CM

## 2024-06-28 DIAGNOSIS — F319 Bipolar disorder, unspecified: Secondary | ICD-10-CM

## 2024-06-28 DIAGNOSIS — F4001 Agoraphobia with panic disorder: Secondary | ICD-10-CM

## 2024-07-21 ENCOUNTER — Telehealth: Admitting: Psychiatry

## 2024-07-21 ENCOUNTER — Encounter: Payer: Self-pay | Admitting: Psychiatry

## 2024-07-21 DIAGNOSIS — F4310 Post-traumatic stress disorder, unspecified: Secondary | ICD-10-CM

## 2024-07-21 DIAGNOSIS — R7989 Other specified abnormal findings of blood chemistry: Secondary | ICD-10-CM

## 2024-07-21 DIAGNOSIS — F319 Bipolar disorder, unspecified: Secondary | ICD-10-CM

## 2024-07-21 DIAGNOSIS — F3162 Bipolar disorder, current episode mixed, moderate: Secondary | ICD-10-CM | POA: Diagnosis not present

## 2024-07-21 DIAGNOSIS — F411 Generalized anxiety disorder: Secondary | ICD-10-CM | POA: Diagnosis not present

## 2024-07-21 DIAGNOSIS — R5382 Chronic fatigue, unspecified: Secondary | ICD-10-CM

## 2024-07-21 DIAGNOSIS — E538 Deficiency of other specified B group vitamins: Secondary | ICD-10-CM

## 2024-07-21 DIAGNOSIS — F4001 Agoraphobia with panic disorder: Secondary | ICD-10-CM

## 2024-07-21 DIAGNOSIS — F40240 Claustrophobia: Secondary | ICD-10-CM

## 2024-07-21 MED ORDER — LAMOTRIGINE 200 MG PO TABS: 200.0000 mg | ORAL_TABLET | Freq: Every day | ORAL | 1 refills | Status: AC

## 2024-07-21 MED ORDER — OXCARBAZEPINE 300 MG PO TABS: 900.0000 mg | ORAL_TABLET | Freq: Every day | ORAL | 1 refills | Status: AC

## 2024-07-21 MED ORDER — LORAZEPAM 1 MG PO TABS: 1.0000 mg | ORAL_TABLET | Freq: Three times a day (TID) | ORAL | 2 refills | Status: AC | PRN

## 2024-07-21 MED ORDER — SERTRALINE HCL 100 MG PO TABS: 100.0000 mg | ORAL_TABLET | Freq: Every day | ORAL | 1 refills | Status: AC

## 2024-07-21 MED ORDER — ARIPIPRAZOLE 10 MG PO TABS: 10.0000 mg | ORAL_TABLET | Freq: Every day | ORAL | 1 refills | Status: AC

## 2024-07-21 NOTE — Progress Notes (Signed)
 [name] [name] [id number] 04-19-1966 58 y.o.   Video Visit via My Chart  I connected with pt by video using My Chart and verified that I am speaking with the correct person using two identifiers.   I discussed the limitations, risks, security and privacy concerns of performing an evaluation and management service by My Chart  and the availability of in person appointments. I also discussed with the patient that there may be a patient responsible charge related to this service. The patient expressed understanding and agreed to proceed.  I discussed the assessment and treatment plan with the patient. The patient was provided an opportunity to ask questions and all were answered. The patient agreed with the plan and demonstrated an understanding of the instructions.   The patient was advised to call back or seek an in-person evaluation if the symptoms worsen or if the condition fails to improve as anticipated.  I provided 15 minutes of video time during this encounter.  The patient was located at home and the provider was located office. Session [phone]  Subjective:   Patient ID:  [name] [name] is a 58 y.o. (DOB 04-Feb-1966) female.  Chief Complaint:  Chief Complaint  Patient presents with   Follow-up   Depression   Anxiety   Stress    Depression        Associated symptoms include fatigue.  Associated symptoms include no decreased concentration, no headaches and no suicidal ideas.  Past medical history includes anxiety.   Anxiety Symptoms include nervous/anxious behavior and palpitations. Patient reports no confusion, decreased concentration, dizziness or suicidal ideas.     [name] [name] presents to the office today for follow-up of several dxes.  At visit  January 2020.  She had recently been diagnosed with low vitamin B12.  She was initiating treatment.  No meds were changed.  visit July 2020.  her anxiety was higher after reducing sertraline .  Therefore it was increased  back to 100 mg daily.  06/2020 appt with the following noted: Cardiologist said she had chronic tx for afib and PVC's and it acts up more when she's under more stress.  Cardiologist suggested Bz when having the episodes.  Better at present but has periods of them daily.  They last an hour or more.  Rarely takes clonazepam , but it helps in 30 mins.  Tolerates clonazepam  well otherwise. She felt increase sertraline  helped some with anxiety.  It is better.  More irritable last couple of weeks than anxious unless heart episode.  Missed a couple of weeks of hormones which might have caused this. Panic attacks from 0-2 weekly is typical but episodes where she may have them daily for a couple of weeks associated with the palpitations as noted. Plan: DC clonazepam  Start lorazepam  1 mg every 8 hours as needed anxiety attacks.  Call back if it is less effective than clonazepam  or less well-tolerated.  04/06/20 appt with the following noted: Real stressed out and feels everything caving in on her.  Work, home and everything.  D broke her leg and ex H had to stay with them. Supervisor stress and short-staffed.  Doesn't want the vaccine. Only one RX lorazepam .  It helped.  But afraid of addiction.  Tends to isolate weekends bc stress. Consistent with oxcarb 900 mg HS, sertraline  100, lamotrigine  200. Wanting to stay up late and then has EMA.   Not hyper, never.  Is irritable.   ExH's family gives her a hard time and drags D into  it. Plan no med changes:  10/05/2020 appointment with the following noted: Some anger and anxiety but probably situational over the Covid situation and dealing with work situation.  Wakes that way a lot of times.  Irritable.  Walked out of work one day.  In the background all the time but will come out with triggers.  Controls it usually.  No panic. Plan: Increase Trileptal  to 300 mg AM & 900 mg PM.   10/20/20 TC Rtc to patient and she reports feeling overwhelmed and stressed at work  causing increased anxiety. She reports going off on her boss Friday. She reports there is someone at work going around telling everyone that patient is bipolar. [name]. She doesn't know how she would know. [name]. I asked if this was discussed at her last visit on 10/05/2020 and she said no. We discussed what she is thinking she needs, she really wants to see what Dr. Geoffry thinks and get his opinion. She reports she thinks she needs 2 weeks off to get to feeling better and remove herself from work for right now. Then intermittent from that time. Informed her that is something he would probably do but I would discuss with him. OK'd  FMLA  11/23/20 TC: Message from staff: Addendum to previous messages. Next appt is 11/29/20 at 1:30. She has called back and said that she is having trouble at work with her co-workers. She wants to check into a hospital but she told me she didn't have any suicide thoughts. She is under a lot of stress at work. Her best friend told someone at her employer that she has bipolar and co-workers are giving her a hard time because of the diagnosis. She said that she is still taking her meds. Her blood pressure when taken a couple of days ago was 184/101 and 148/88. She would like to be pointed in the right direction of what she needs to do? Her phone number is [phone]. She would like to speak to someone before her visit on Monday.   MD TC with pt:  Work stress with boss spreading info that she's bipolar.  Getting knit picked at work.  Did this to another friend who had bipolar disorder. Works [employer].  Talked to HR yesterday.  Get a final warning writeup and feels it is unjust.  Working there since 2017 and all started in last 2 mos.    Was doing fine until this all started.  Feels discriminated against.  No SI.  Will see her Monday and start FMLA for a couple of weeks and we'll  make med changes. She agrees to the plan.  Lorene Geoffry, MD, DFAPA  11/29/20 appt noted: Worked today.  Got new supervisor who's cleaning house.  She feels picked on.  This has made her mad and she has expressed anger at work and that's gotten her written up. I feel on edge all the time.  Also overwhelmed at home and problems with daughter as well.  Problems keeping things straight at home. More anger and irritability.   Patient denies difficulty with sleep initiation or maintenance. Denies appetite disturbance.  Patient reports that energy and motivation have been good.  She still has complaints of concentration and forgetfulness as previously noted.  Patient denies any suicidal ideation.  Still forgetful.   01/27/21 appt noted: Feels she's being mistreated at work bc she's bipolar.  Plans to get a new job.  Trying to get house ready to sale but D and son won't help with it.  Needed a leave from work.  Had panic attack last week thinking of going there.  Cried at work when there the last day. She is more down as well as agitated and angry.  She has difficulty react really relaxing.  She easily panics.  She has difficulty concentrating.  She has feelings of hopelessness and helplessness.  No suicidal thoughts She has not tried the Vraylar  as suggested so far but agrees to do so. Plan: Start Vraylar  1.5 mg daily and if it works will wean other meds. Agree with medical leave until August 1   02/23/2021 appointment with the following noted: Disability approved until 02/28/21 but requested until August 1. Doesn't feel capable of RTW due to panic over thoughts of it.  Feels people are talking about her negatively at work.  Too anxious being around coworkers. Did not start Vraylar  bc scared of antipsychotics. Sx continue as noted above. HA and GI problems better not at work. Anxiety about work causes these sx and her BP to go up Plan: Start Vraylar  1.5 mg daily and if it works will wean other meds.   She  agrees this time to take it.  Call if you change your mind or don't tolerate it. Agree with extending medical leave until August 8 due to depression, mood swings, anger outbursts, crying spells that interfere with ability to do her work and reduced concentration.   03/23/2021 phone call from patient:Patient lm to cancel appt on 8/5 stating if she was doing well per Dr Geoffry she didn't need it. She rescheduled for October next available. Patient will need a Rx for the Vraylar  for samples were given at her last visit. 04/18/2021 phone call from patient:Pt left a message that the vraylar  is to expensive and she will not be taking this medicine. Sent prescription for Abilify  10 mg in place of Vraylar   05/31/2021 appointment with the following noted: Insurance would not pay for Vraylar . Abilify  10 mg worked pretty well for 5 and 1/2 weeks.  No SE now. Once accidentally took 20 mg and got dizzy with NV. Seen mood benefit with less depression and less irritable. Had argument with mother and threatened OD and IVC spent 12 hours in hospital after OD of 5 mg lorazepam  but wasn't trying to kill herself but was really mad at mother in a rage.  Doesn't get violent but mother triggers her. Boss now leaving her alone since her leave.  She's had no work performance problems.  She has talked to boss and it went well.  He's backed off micromanagment. No SE.  Eating less.  Happier. Plan: Continue Abilify  10 mg daily.  Option increase if needed call..  07/19/2021 phone call: Patient complaining that she felt shaky and restless on Abilify  10 mg for several weeks.  She was offered the option to reduce the dose.  08/11/2021 appointment with the following noted: For awhile had restless legs around knees but gone now. Family says she's doing better on it, not as irritable.  Not as on edge.   Still on Abilify  10 mg daily, lamotrigine  200, oxcarbazepine  900 mg HS, sertraline  100 mg daily. Not really depressed generally.   Sleep is ok but awakens early to go to bathroom.  At least 7 hours. Good sleeper.   No bouts SI since here.   Good work function. SE none unless weight gain.  Plan: Continue Abilify  10 mg daily.  Option increase if needed call.. Continue Abilify  10 mg daily, lamotrigine  200, oxcarbazepine  900 mg HS,  Reduce sertraline  75 mg daily to see if can lose weight.  11/07/2021 appointment with the following noted: Reduced sertraline  to 50 mg for 3 weeks. Doing ok.  Cut to 25 mg daily but too irritable.  Manageable on 50 mg daily. Anxiety about the same and OK with benefit with Ativan .  Life is stressful.   Ok with depression.  Calmer on Abilify .  But used to chaos and it seems bored.   Satisfied with meds. Tired all the time.  Sleep is good.  Alsways been able to sleep a lot. D is ADD and she questions Being ADD. IN: Continue Abilify  10 mg daily.  Option increase if needed call.. Continue Abilify  10 mg daily, lamotrigine  200, oxcarbazepine  900 mg HS,  Reduce sertraline  50 mg daily to see if can lose weight. OK trial modafinil  for poss ADD and chronic fatigue 100-200mg  am .  Disc [facility]  04/25/2022 appointment noted: Never got modafinil  DT cost Fine with reduced sertraline . Anxiety is not worse and mood is better.  Don't let stuff bother me as much as I used to. Still has a lot of chronic anxiety. Feels better with Abilify .  Less obs thoughts and less irritable and not yelling.  07/27/22 appt noted; No problems with reduction oxcarb.  No SE change. Anxiety is a little worse and thinks it comes from work.  Not feel anxious in her mind but her body reacts with sweating and N.  Anx not worse in other situations.  Gets racing thoughts more now, repetitive.   Modafinil  didn't help fatigue or alterness and caused SE insomnia. Otherwise sleeps well.  8 hr work days and 10 h weekends. M and son say she has better mood with Abilify . Plan: Continue Abilify  10 mg daily.  Option increase if needed call.. it  helped a lot. Continue Abilify  10 mg daily, lamotrigine  200 More anxious with less so increase oxcarbazepine  bcakc to 900 mg HS sertraline  50 mg daily.  Consider increase for anxiety  11/16/22 appt noted: Not much lorazepam  Anxiety better with increase Trileptal  900 mg HS, Abilify  10, sertraline  50, lamotrigine  200,  A lot calmer with Abilify  and mother notices.  No anger swings. Sleep pretty good No panic attacks. No sig SE.  Dizzy if forgets meds. Easy job but very stressful dealing with management Was told she had low b12, still working on getting it up.  Getting shots. Plan: Continue Abilify  10 mg daily, lamotrigine  200 Better anxious  oxcarbazepine  back to 900 mg HS sertraline  50 mg daily.  Consider increase for anxiety  05/22/23 appt noted: Meds as above:  lorazepam  1 mg prn stress.  Others No sig SE unless Abilify  wt gain .  Gained 15#/3 mos Likes the benefit from Abilify . Pretty good mood overall.  No panic.  Normal mood . Calmest I've been in a long time Asks me to refill Protonix  which works bc hard to get into her doctorl.   Pleased with meds. Plan no med changes: No med changes Continue Abilify  10 mg daily.  Option increase if needed call.. it helped a lot. Continue Abilify  10 mg daily, lamotrigine  200 Better anxious  oxcarbazepine  back to 900 mg HS sertraline  50 mg daily.  Consider increase for anxiety  11/21/23 appt noted: video Meds:  Abilify  10, lamotrigine  200 HS, oxcarbazepine  900 HS, sertraline  50.  Lorazepam  1 mg BID-TID A  lot more anxiety.  Needing more lorazepam .  F in hosp since DEC and will likely be coming to live with her.  Put in for FMLA.  DT anxiety and caring for father.  Wants a month off from work for anxiety.  No time to be depressed .  Can't sleep .  Feel like crawling out of her skin.   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  Would like May 1 RTW.    EMA with anxiety. Feels nervous and shakey a lot.  N.  Not eating well.  Having trouble functioning.  Worse anxiety for a couple of months.  Thought he was dying 3 different times. Her anxiety was ok and managed until F hosp Xmas eve.  Anxiety gradually worse.  Like PTSD sort of. F delirium, pneumonia and ventilator twice.   Plan: Continue Abilify  10 mg daily,  lamotrigine  200 oxcarbazepine  back to 900 mg HS increase for anxiety to sertraline  100 mg daily.  Increase lorazepam  1 mg TID prn until sertraline  starts helping.  04/17/24 appt noted:  Meds:  Abilify  10, lamotrigine  200 HS, oxcarbazepine  900 HS, sertraline  100.  Lorazepam  1 mg BID-TID Co rash on chest .  Fine itchy.  But likely contact dermatitis.  Took 100 mg sertraline  for awhile then rF old 19 , but wants to increase back to 100 for anxiety.  Taking 50 most of time since appt.  More anxious and shakes some with anxiety.  Likely stress with father causing it. Having to deal with father's estate stuff.  He's in NH. F bipolar on Abilify  and helps.  Feels butterflies in stomach. No SE except Lorazepam  mild sleepiness.  About one every other day. Real micromanager at work increases everyone's anxiety. Sleep pretty well.   Plan inccrease sertraline  back to 100 mg daily for anxiety  07/21/24 appt noted:  Meds:  Abilify  10, lamotrigine  200 HS, oxcarbazepine  900 HS, sertraline  100.  Lorazepam  1 mg BID-TID Anxiety better with increase sertraline .  Don't get as uptight and overwhelmed with increase sertraline .   No SE  Rash resolved.   She has no major mood swings and overall she feels pretty good regarding mood.  No significant manic symptoms since increasing the sertraline  but she understands there is some risk of that.  It has been helpful and she wants to continue the meds as they are. Continues to be a lot of stress caring for her father who is in and out of hospice.  Sleep is okay.  She is functioning okay at work.  hysterectomy for  endometriosis and normally takes a patch distributing hormones though she is recently been out and it worsened her mood when she was not taking it..  Worried about hormones affecting mood.  Hx bad PMS.  Prior psychiatric medication trials include  sertraline ,  Depakote,  lithium, lamotrigine , Trileptal  900, Saphris, risperidone, Vraylar  1.5 worked but $, Abilify  10 Clonazepam .  Xanax SE, lorazepam  1. Modafinil  NR and SE ins  F psych sx better with Abilify   Review of Systems:  Review of Systems  Constitutional:  Positive for fatigue. Negative for fever.  HENT:  Positive for hearing loss.   Cardiovascular:  Positive for palpitations.  Neurological:  Negative for dizziness, tremors and headaches.  Psychiatric/Behavioral:  Negative for agitation, behavioral problems, confusion, decreased concentration, dysphoric mood, hallucinations, self-injury, sleep disturbance and suicidal ideas. The patient is nervous/anxious. The  patient is not hyperactive.     Medications: I have reviewed the patient's current medications.  Current Outpatient Medications  Medication Sig Dispense Refill   amLODipine  (NORVASC ) 5 MG tablet Take 1 tablet (5 mg total) by mouth daily. 90 tablet 3   ibuprofen  (ADVIL ) 200 MG tablet Takes 1-3 tablets as needed     Multiple Vitamin (MULTIVITAMIN WITH MINERALS) TABS tablet Take 1 tablet by mouth at bedtime.     pantoprazole  (PROTONIX ) 40 MG tablet Take 1 tablet (40 mg total) by mouth daily. 90 tablet 3   tirzepatide  (ZEPBOUND ) 2.5 MG/0.5ML Pen Inject 2.5 mg into the skin once a week. 2 mL 0   tiZANidine (ZANAFLEX) 4 MG tablet Take 1 tablet every 8 hours by oral route as needed.     ARIPiprazole  (ABILIFY ) 10 MG tablet Take 1 tablet (10 mg total) by mouth daily. 90 tablet 1   lamoTRIgine  (LAMICTAL ) 200 MG tablet Take 1 tablet (200 mg total) by mouth at bedtime. 90 tablet 1   LORazepam  (ATIVAN ) 1 MG tablet Take 1 tablet (1 mg total) by mouth every 8 (eight) hours as needed for  anxiety. 30 tablet 2   Oxcarbazepine  (TRILEPTAL ) 300 MG tablet Take 3 tablets (900 mg total) by mouth at bedtime. 270 tablet 1   sertraline  (ZOLOFT ) 100 MG tablet Take 1 tablet (100 mg total) by mouth daily. 90 tablet 1   No current facility-administered medications for this visit.    Medication Side Effects: None  Allergies:  Allergies  Allergen Reactions   Phenylmercuric Nitrate Rash   Augmentin [Amoxicillin-Pot Clavulanate] Nausea Only    Has patient had a PCN reaction causing immediate rash, facial/tongue/throat swelling, SOB or lightheadedness with hypotension: No Has patient had a PCN reaction causing severe rash involving mucus membranes or skin necrosis: No Has patient had a PCN reaction that required hospitalization: No Has patient had a PCN reaction occurring within the last 10 years: Yes If all of the above answers are NO, then may proceed with Cephalosporin use.    Codeine Nausea Only   Diclofenac Nausea Only   Latex Rash   Neomycin Rash   Percocet [Oxycodone-Acetaminophen ] Rash    Hives     Past Medical History:  Diagnosis Date   Anxiety    Arthritis    B12 deficiency    Chicken pox    Depression    Fibroid    GERD (gastroesophageal reflux disease)    History of hiatal hernia    Hypertension    Iron deficiency anemia    Menorrhagia    Migraines    PAF (paroxysmal atrial fibrillation) (HCC)    a. 04/2013 Echo: EF 55-60%, impaired relaxation. Mild MR/TR. Nl RV fxn; b. CHA2DS2VASc = 2; c. PRN flecainide .   Snores     Family History  Problem Relation Age of Onset   Diabetes Mother    Alcohol abuse Father    Arthritis Father    Heart disease Father    Arthritis Sister    Alcohol abuse Maternal Aunt    Alcohol abuse Maternal Uncle    Alcohol abuse Paternal Grandfather    Lung cancer Paternal Grandfather        smoker    Arthritis Maternal Grandmother    Schizophrenia Maternal Grandmother    Leukemia Maternal Grandfather    Breast cancer Neg Hx      Social History   Socioeconomic History   Marital status: Legally Separated    Spouse name: Not on file  Number of children: Not on file   Years of education: Not on file   Highest education level: Not on file  Occupational History   Not on file  Tobacco Use   Smoking status: Former    Current packs/day: 0.00    Average packs/day: 1 pack/day for 10.0 years (10.0 ttl pk-yrs)    Types: Cigarettes    Start date: 08/21/1981    Quit date: 08/22/1991    Years since quitting: 32.9    Passive exposure: Past   Smokeless tobacco: Never  Vaping Use   Vaping status: Never Used  Substance and Sexual Activity   Alcohol use: Not Currently    Alcohol/week: 0.0 standard drinks of alcohol    Comment: twice a year    Drug use: Not Currently    Types: Marijuana    Comment: not for a couple years   Sexual activity: Yes    Partners: Male    Birth control/protection: Surgical    Comment: Husband  Other Topics Concern   Not on file  Social History Narrative   [facility]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    Lives with husband and 2 children    Son- 78 yo and daughter 53 yo    Pets: 2 dogs    Caffeine- 2 sodas 20 oz, coffee 1 cup occasionally    Enjoys being outside with family       DPR mom Tawni Coaster [phone]    Social Drivers of Health   Financial Resource Strain: Medium Risk (06/27/2023)   Received from [facility]   Overall Financial Resource Strain (CARDIA)    Difficulty of Paying Living Expenses: Somewhat hard  Food Insecurity: Food Insecurity Present (06/27/2023)   Received from [facility]   Hunger Vital Sign    Within the past 12 months, you worried that your food would run out before you got the money to buy more.: Sometimes true    Within the past 12 months, the food you bought just didn't last and you didn't have money to get more.: Never true  Transportation Needs: No Transportation Needs (06/27/2023)   Received from [facility] - Transportation    In the past 12 months, has lack of transportation kept you from medical appointments or from getting medications?: No    Lack of Transportation (Non-Medical): No  Physical Activity: Not on file  Stress: Not on file  Social Connections: Not on file  Intimate Partner Violence: Not on file    Past Medical History, Surgical history, Social history, and Family history were reviewed and updated as appropriate.   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   Please see review of systems for further details on the patient's review from today.   Objective:   Physical Exam:  LMP  (LMP Unknown)   Physical Exam Neurological:     Mental Status: She is alert and oriented to person, place, and time.     Cranial Nerves: No dysarthria.  Psychiatric:        Attention and Perception: Attention and perception normal.        Mood and Affect: Mood is anxious. Mood is not depressed.        Speech: Speech normal.        Behavior: Behavior is cooperative.        Thought Content: Thought content normal. Thought content is not paranoid or delusional. Thought content does not include  homicidal or suicidal ideation. Thought content does not include suicidal plan.        Cognition and Memory: Cognition and memory normal.        Judgment: Judgment normal.     Comments: Insight intact Anxiety much better with increase sertraline      Lab Review:     Component Value Date/Time   NA 144 05/26/2024 1142   NA 138 11/25/2014 2152   K 4.2 05/26/2024 1142   K 3.7 11/25/2014 2152   CL 107 (H) 05/26/2024 1142   CL 105 11/25/2014 2152   CO2 25 05/26/2024 1142   CO2 28 11/25/2014 2152   GLUCOSE 98 05/26/2024 1142   GLUCOSE 96 04/26/2021 1615   GLUCOSE 98 11/25/2014 2152   BUN 9 05/26/2024 1142   BUN 10 11/25/2014 2152   CREATININE 0.80 05/26/2024 1142   CREATININE 0.81 11/25/2014 2152   CALCIUM 10.1 05/26/2024 1142   CALCIUM 10.0 11/25/2014 2152   PROT 6.6  05/26/2024 1142   PROT 7.2 11/25/2014 2152   ALBUMIN 4.3 05/26/2024 1142   ALBUMIN 4.1 11/25/2014 2152   AST 15 05/26/2024 1142   AST 15 11/25/2014 2152   ALT 19 05/26/2024 1142   ALT 13 (L) 11/25/2014 2152   ALKPHOS 104 05/26/2024 1142   ALKPHOS 61 11/25/2014 2152   BILITOT 0.2 05/26/2024 1142   BILITOT 0.4 11/25/2014 2152   GFRNONAA >60 04/26/2021 1615   GFRNONAA >60 11/25/2014 2152   GFRAA 90 05/24/2020 1701   GFRAA >60 11/25/2014 2152       Component Value Date/Time   WBC 4.2 05/26/2024 1142   WBC 4.5 04/26/2021 1615   RBC 4.59 05/26/2024 1142   RBC 4.70 04/26/2021 1615   HGB 13.8 05/26/2024 1142   HCT 41.5 05/26/2024 1142   PLT 315 05/26/2024 1142   MCV 90 05/26/2024 1142   MCV 77 (L) 11/25/2014 2152   MCH 30.1 05/26/2024 1142   MCH 29.8 04/26/2021 1615   MCHC 33.3 05/26/2024 1142   MCHC 34.7 04/26/2021 1615   RDW 12.9 05/26/2024 1142   RDW 22.4 (H) 11/25/2014 2152   LYMPHSABS 1.8 05/26/2024 1142   LYMPHSABS 2.4 03/27/2013 0500   MONOABS 0.4 07/30/2018 1033   MONOABS 0.4 03/27/2013 0500   EOSABS 0.1 05/26/2024 1142   EOSABS 0.2 03/27/2013 0500   BASOSABS 0.0 05/26/2024 1142   BASOSABS 0.0 03/27/2013 0500    No results found for: POCLITH, LITHIUM   No results found for: PHENYTOIN, PHENOBARB, VALPROATE, CBMZ   .res Assessment: Plan:    Saja was seen today for follow-up, depression, anxiety and stress.  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 -     lamoTRIgine  (LAMICTAL ) 200 MG tablet; Take 1 tablet (200 mg total) by mouth at bedtime.  Generalized anxiety disorder -     sertraline  (ZOLOFT ) 100 MG tablet; Take 1 tablet (100 mg total) by mouth daily.  Panic disorder with agoraphobia -     sertraline  (ZOLOFT ) 100 MG tablet; Take 1 tablet (100 mg total) by mouth daily. -     LORazepam  (ATIVAN ) 1  MG tablet; Take 1 tablet (1 mg total) by mouth every 8 (eight) hours as needed for anxiety.  PTSD (post-traumatic stress disorder) -     sertraline  (ZOLOFT ) 100 MG tablet; Take 1 tablet (100 mg total) by  mouth daily.  Claustrophobia  Chronic fatigue  B12 deficiency  Low vitamin D  level  Bipolar 1 disorder, mixed, moderate (HCC) -     ARIPiprazole  (ABILIFY ) 10 MG tablet; Take 1 tablet (10 mg total) by mouth daily.     We discussed Marinna has a history of bipolar disorder with heavy seasonal component .    Vraylar  helped her mood but she could not afford it.  We switched to Abilify  10 mg which she has been on for several months and so far mood stability and depression are better and much calmer until her father's health crisis.  She is not having any side effects except some wt.  She has tried all non-antipsychotic mood stabilizers except CBZ which is similar to oxcarbazapine that has been inadequate. Discussed potential metabolic side effects associated with atypical antipsychotics, as well as potential risk for movement side effects. Advised pt to contact office if movement side effects occur.   Continue Abilify  10 mg daily.  Option increase if needed call.. it helped a lot. Continue Abilify  10 mg daily,  lamotrigine  200 oxcarbazepine  back to 900 mg HS anxiety better with increase sertraline  100 mg daily.   lorazepam  1 mg TID prn until sertraline  starts helping.  Low B12 and vitamin D  can contribute to psych problems and can be consequence of AEDs.  We discussed the short-term risks associated with benzodiazepines including sedation and increased fall risk among others.  Discussed long-term side effect risk including dependence, potential withdrawal symptoms, and the potential eventual dose-related risk of dementia.  But recent studies from 2020 dispute this association between benzodiazepines and dementia risk. Newer studies in 2020 do not support an association with  dementia. Disc risk dependence in detail in terms of the amount that can be used without it. Ativan  less sedating than Xanax and helps. Prn panic  FMLA:  ntermittent FMLA  1 day leave 4 times per month bc: F in hosp since DEC and then [facility].    She still has some residual anxiety but it is better since the increase in sertraline .  She is not having mood swings though she understands that is a risk with SSRIs and bipolar disorder but the benefit for anxiety has been worth it.  She is satisfied with meds overall.  No med changes.  Call prn.  FU 2 -3 mos  Lorene Macintosh, MD, DFAPA    Please see After Visit Summary for patient specific instructions.  No future appointments.    No orders of the defined types were placed in this encounter.      -------------------------------

## 2024-07-24 ENCOUNTER — Telehealth: Payer: Self-pay

## 2024-07-24 DIAGNOSIS — Z860100 Personal history of colon polyps, unspecified: Secondary | ICD-10-CM

## 2024-07-24 NOTE — Telephone Encounter (Signed)
 Per pt she's ready to schedule procedure

## 2024-07-28 ENCOUNTER — Other Ambulatory Visit: Payer: Self-pay

## 2024-07-28 DIAGNOSIS — Z860100 Personal history of colon polyps, unspecified: Secondary | ICD-10-CM

## 2024-07-28 NOTE — Telephone Encounter (Signed)
 Gastroenterology Pre-Procedure Review  Request Date: 11/04/24 Requesting Physician: Dr. Jinny  PATIENT REVIEW QUESTIONS: The patient responded to the following health history questions as indicated:    1. Are you having any GI issues? no 2. Do you have a personal history of Polyps? yes (yes last colonoscopy 03/26/2019 recommended repeat in 5 years) 3. Do you have a family history of Colon Cancer or Polyps? no 4. Diabetes Mellitus? no 5. Joint replacements in the past 12 months?no 6. Major health problems in the past 3 months?no 7. Any artificial heart valves, MVP, or defibrillator?no    MEDICATIONS & ALLERGIES:    Patient reports the following regarding taking any anticoagulation/antiplatelet therapy:   Plavix, Coumadin, Eliquis, Xarelto, Lovenox, Pradaxa, Brilinta, or Effient? no Aspirin? no  Patient confirms/reports the following medications:  Current Outpatient Medications  Medication Sig Dispense Refill   amLODipine  (NORVASC ) 5 MG tablet Take 1 tablet (5 mg total) by mouth daily. 90 tablet 3   ARIPiprazole  (ABILIFY ) 10 MG tablet Take 1 tablet (10 mg total) by mouth daily. 90 tablet 1   ibuprofen  (ADVIL ) 200 MG tablet Takes 1-3 tablets as needed     lamoTRIgine  (LAMICTAL ) 200 MG tablet Take 1 tablet (200 mg total) by mouth at bedtime. 90 tablet 1   LORazepam  (ATIVAN ) 1 MG tablet Take 1 tablet (1 mg total) by mouth every 8 (eight) hours as needed for anxiety. 30 tablet 2   Multiple Vitamin (MULTIVITAMIN WITH MINERALS) TABS tablet Take 1 tablet by mouth at bedtime.     Oxcarbazepine  (TRILEPTAL ) 300 MG tablet Take 3 tablets (900 mg total) by mouth at bedtime. 270 tablet 1   pantoprazole  (PROTONIX ) 40 MG tablet Take 1 tablet (40 mg total) by mouth daily. 90 tablet 3   sertraline  (ZOLOFT ) 100 MG tablet Take 1 tablet (100 mg total) by mouth daily. 90 tablet 1   tirzepatide  (ZEPBOUND ) 2.5 MG/0.5ML Pen Inject 2.5 mg into the skin once a week. 2 mL 0   tiZANidine (ZANAFLEX) 4 MG tablet Take  1 tablet every 8 hours by oral route as needed.     No current facility-administered medications for this visit.    Patient confirms/reports the following allergies:  Allergies  Allergen Reactions   Phenylmercuric Nitrate Rash   Augmentin [Amoxicillin-Pot Clavulanate] Nausea Only    Has patient had a PCN reaction causing immediate rash, facial/tongue/throat swelling, SOB or lightheadedness with hypotension: No Has patient had a PCN reaction causing severe rash involving mucus membranes or skin necrosis: No Has patient had a PCN reaction that required hospitalization: No Has patient had a PCN reaction occurring within the last 10 years: Yes If all of the above answers are NO, then may proceed with Cephalosporin use.    Codeine Nausea Only   Diclofenac Nausea Only   Latex Rash   Neomycin Rash   Percocet [Oxycodone-Acetaminophen ] Rash    Hives     No orders of the defined types were placed in this encounter.   AUTHORIZATION INFORMATION Primary Insurance: 1D#: Group #:  Secondary Insurance: 1D#: Group #:  SCHEDULE INFORMATION: Date: 11/05/23 Time: Location: [facility]

## 2024-07-28 NOTE — Addendum Note (Signed)
 Addended by: CURTISS ROSALINE RAMAN on: 07/28/2024 02:41 PM   Modules accepted: Orders

## 2024-08-13 ENCOUNTER — Telehealth: Payer: Self-pay

## 2024-08-13 NOTE — Addendum Note (Signed)
 Addended by: JODIE HEADINGS on: 08/13/2024 11:25 AM   Modules accepted: Orders

## 2024-08-13 NOTE — Telephone Encounter (Signed)
 Patient called to reschedule her procedure from [facility] 11/05/2023 with Dr. Jinny to 10/24/2024 [facility] with Dr. Melany.

## 2024-09-02 ENCOUNTER — Ambulatory Visit

## 2024-09-02 ENCOUNTER — Telehealth: Payer: Self-pay | Admitting: Emergency Medicine

## 2024-09-02 ENCOUNTER — Ambulatory Visit
Admission: EM | Admit: 2024-09-02 | Discharge: 2024-09-02 | Disposition: A | Discharge status: Home / Self Care | Attending: Emergency Medicine | Admitting: Emergency Medicine

## 2024-09-02 DIAGNOSIS — M79675 Pain in left toe(s): Secondary | ICD-10-CM

## 2024-09-02 NOTE — ED Provider Notes (Signed)
 " [name]    CSN: [id number] Arrival date & time: 09/02/24  1337      History   Chief Complaint Chief Complaint  Patient presents with   Toe Injury    HPI [name] [name] is a 59 y.o. female.   Patient presents for evaluation of left fifth toe pain and left foot pain beginning 3 to 4 days ago after injury.  Endorses that foot collided with couch pushing the pinky toe into the foot.  Has experienced bruising pain and swelling since.  Painful to bear weight and to complete range of motion.  Experiencing numbness towards the end of the day when swelling is increased.  Has attempted use of ibuprofen  with only minimal relief.   Past Medical History:  Diagnosis Date   Anxiety    Arthritis    B12 deficiency    Chicken pox    Depression    Fibroid    GERD (gastroesophageal reflux disease)    History of hiatal hernia    Hypertension    Iron deficiency anemia    Menorrhagia    Migraines    PAF (paroxysmal atrial fibrillation) (HCC)    a. 04/2013 Echo: EF 55-60%, impaired relaxation. Mild MR/TR. Nl RV fxn; b. CHA2DS2VASc = 2; c. PRN flecainide .   Snores     Patient Active Problem List   Diagnosis Date Noted   Morbid obesity (HCC) 05/26/2024   Essential hypertension 06/27/2023   Bipolar 1 disorder (HCC) 06/27/2023   Gastric polyp    Screening for breast cancer    Polyp of sigmoid colon    Diverticulosis of large intestine without diverticulitis    HLD (hyperlipidemia) 03/12/2019   Vitamin D  deficiency 03/12/2019   Pure hypertriglyceridemia 02/27/2019   Menopause 02/27/2019   S/P total hysterectomy and BSO (bilateral salpingo-oophorectomy) 08/05/2018   B12 deficiency 06/04/2018   PTSD (post-traumatic stress disorder) 05/14/2018   HSV (herpes simplex virus) infection 11/27/2017   HNP (herniated nucleus pulposus), cervical 07/29/2015   GERD (gastroesophageal reflux disease) 12/10/2014   Migraines 12/10/2014    Past Surgical History:  Procedure Laterality  Date   ABDOMINAL HYSTERECTOMY     APPENDECTOMY  5/11   C-spine surgery     CESAREAN SECTION  10/98,9/12   COLONOSCOPY WITH PROPOFOL  N/A 03/26/2019   Procedure: COLONOSCOPY WITH PROPOFOL ;  Surgeon: Janalyn Keene NOVAK, MD;  Location: [facility] ENDOSCOPY;  Service: Endoscopy;  Laterality: N/A;   ESOPHAGOGASTRODUODENOSCOPY (EGD) WITH PROPOFOL  N/A 03/26/2019   Procedure: ESOPHAGOGASTRODUODENOSCOPY (EGD) WITH PROPOFOL ;  Surgeon: Janalyn Keene NOVAK, MD;  Location: [facility] ENDOSCOPY;  Service: Endoscopy;  Laterality: N/A;   HYSTERECTOMY ABDOMINAL WITH SALPINGO-OOPHORECTOMY Bilateral 08/05/2018   Procedure: HYSTERECTOMY ABDOMINAL WITH BILATERAL SALPINGO-OOPHORECTOMY;  Surgeon: Kathe Gladis LABOR, MD;  Location: [facility] ORS;  Service: Gynecology;  Laterality: Bilateral;   TONSILLECTOMY  03/2008   TUBAL LIGATION      OB History     Gravida  5   Para  3   Term  2   Preterm  1   AB  2   Living  2      SAB      IAB  2   Ectopic      Multiple      Live Births  2        Obstetric Comments  1st Menstrual Cycle:  16 1st Pregnancy:  17          Home Medications    Prior to Admission medications  Medication Sig Start  Date End Date Taking? Authorizing Provider  amLODipine  (NORVASC ) 5 MG tablet Take 1 tablet (5 mg total) by mouth daily. 11/23/23  Yes Ziglar, Susan K, MD  ARIPiprazole  (ABILIFY ) 10 MG tablet Take 1 tablet (10 mg total) by mouth daily. 07/21/24  Yes Cottle, Lorene KANDICE Raddle., MD  ibuprofen  (ADVIL ) 200 MG tablet Takes 1-3 tablets as needed   Yes [provider]  lamoTRIgine  (LAMICTAL ) 200 MG tablet Take 1 tablet (200 mg total) by mouth at bedtime. 07/21/24  Yes Cottle, Lorene KANDICE Raddle., MD  LORazepam  (ATIVAN ) 1 MG tablet Take 1 tablet (1 mg total) by mouth every 8 (eight) hours as needed for anxiety. 07/21/24  Yes Cottle, Lorene KANDICE Raddle., MD  Multiple Vitamin (MULTIVITAMIN WITH MINERALS) TABS tablet Take 1 tablet by mouth at bedtime.   Yes [provider]  Oxcarbazepine   (TRILEPTAL ) 300 MG tablet Take 3 tablets (900 mg total) by mouth at bedtime. 07/21/24  Yes Cottle, Lorene KANDICE Raddle., MD  pantoprazole  (PROTONIX ) 40 MG tablet Take 1 tablet (40 mg total) by mouth daily. 11/23/23  Yes Ziglar, Susan K, MD  sertraline  (ZOLOFT ) 100 MG tablet Take 1 tablet (100 mg total) by mouth daily. 07/21/24  Yes Cottle, Lorene KANDICE Raddle., MD  tiZANidine (ZANAFLEX) 4 MG tablet Take 1 tablet every 8 hours by oral route as needed. 04/11/21  Yes [provider]  tirzepatide  (ZEPBOUND ) 2.5 MG/0.5ML Pen Inject 2.5 mg into the skin once a week. 05/26/24   Ziglar, Devere POUR, MD    Family History Family History  Problem Relation Age of Onset   Diabetes Mother    Alcohol abuse Father    Arthritis Father    Heart disease Father    Arthritis Sister    Alcohol abuse Maternal Aunt    Alcohol abuse Maternal Uncle    Alcohol abuse Paternal Grandfather    Lung cancer Paternal Grandfather        smoker    Arthritis Maternal Grandmother    Schizophrenia Maternal Grandmother    Leukemia Maternal Grandfather    Breast cancer Neg Hx     Social History Social History[1]   Allergies   Phenylmercuric nitrate, Augmentin [amoxicillin-pot clavulanate], Codeine, Diclofenac, Latex, Neomycin, and Percocet [oxycodone-acetaminophen ]   Review of Systems Review of Systems   Physical Exam Triage Vital Signs ED Triage Vitals  Encounter Vitals Group     BP 09/02/24 1414 118/80     Girls Systolic BP Percentile --      Girls Diastolic BP Percentile --      Boys Systolic BP Percentile --      Boys Diastolic BP Percentile --      Pulse Rate 09/02/24 1414 72     Resp 09/02/24 1414 18     Temp 09/02/24 1414 98.2 F (36.8 C)     Temp Source 09/02/24 1414 Oral     SpO2 09/02/24 1414 96 %     Weight 09/02/24 1414 200 lb (90.7 kg)     Height 09/02/24 1414 5' 4 (1.626 m)     Head Circumference --      Peak Flow --      Pain Score 09/02/24 1413 6     Pain Loc --      Pain Education --      Exclude  from Growth Chart --    No data found.  Updated Vital Signs BP 118/80 (BP Location: Right Arm)   Pulse 72   Temp 98.2 F (36.8 C) (Oral)  Resp 18   Ht 5' 4 (1.626 m)   Wt 200 lb (90.7 kg)   LMP  (LMP Unknown)   SpO2 96%   BMI 34.33 kg/m   Visual Acuity Right Eye Distance:   Left Eye Distance:   Bilateral Distance:    Right Eye Near:   Left Eye Near:    Bilateral Near:     Physical Exam Constitutional:      Appearance: Normal appearance.  Eyes:     Extraocular Movements: Extraocular movements intact.  Pulmonary:     Effort: Pulmonary effort is normal.  Musculoskeletal:     Comments: Erythema and generalized tenderness to the left fifth toe, point tenderness to the base of the fifth metatarsal, no deformity present, able to complete range of motion and bear weight but pain is elicited with all movement, sensation intact, capillary refill less than 3, 2+ pedal pulse  Neurological:     Mental Status: She is alert and oriented to person, place, and time.      UC Treatments / Results  Labs (all labs ordered are listed, but only abnormal results are displayed) Labs Reviewed - No data to display  EKG   Radiology No results found.  Procedures Procedures (including critical care time)  Medications Ordered in UC Medications - No data to display  Initial Impression / Assessment and Plan / UC Course  I have reviewed the triage vital signs and the nursing notes.  Pertinent labs & imaging results that were available during my care of the patient were reviewed by me and considered in my medical decision making (see chart for details).  Pain in left toe  X-ray pending, buddy tape the 4th and 5th toe for stability and support, if fracture noted to the foot will return for orthotic boot, recommended continued use of NSAIDs and RICE, given a walker referral to orthopedics if fracture present Final Clinical Impressions(s) / UC Diagnoses   Final diagnoses:  None    Discharge Instructions   None    ED Prescriptions   None    PDMP not reviewed this encounter.     [1]  Social History Tobacco Use   Smoking status: Former    Current packs/day: 0.00    Average packs/day: 1 pack/day for 10.0 years (10.0 ttl pk-yrs)    Types: Cigarettes    Start date: 08/21/1981    Quit date: 08/22/1991    Years since quitting: 33.0    Passive exposure: Past   Smokeless tobacco: Never  Vaping Use   Vaping status: Never Used  Substance Use Topics   Alcohol use: Not Currently    Alcohol/week: 0.0 standard drinks of alcohol    Comment: twice a year    Drug use: Not Currently    Types: Marijuana    Comment: not for a couple years     Teresa Shelba SAUNDERS, NP 09/02/24 1519  "

## 2024-09-02 NOTE — Discharge Instructions (Signed)
 X-ray is pending and you will be notified of results via telephone  As injury occurred to the fifth toe we have buddy tape the 4th and 5th toe to add stability and support when completing movement, wear when completing activities  If there is a fracture in the foot we will have you return to the clinic to receive a orthotic boot which helps to stabilize the injury  You may apply ice or heat over the affected area in 10 to 15-minute intervals  You may elevate whenever sitting and lying to help reduce swelling  You may continue use of ibuprofen  and take Tylenol  additionally  If there is a break in the bone then you will follow-up with orthopedics whose information is on front page, call and schedule appoint

## 2024-09-02 NOTE — ED Triage Notes (Signed)
 Onset 3-4 days ago Patient injured the left foot. States she ran into the couch. Pain starts in the pinky toe and up into the entire foot.   Patient has taken ibuprofen  with mild relief.

## 2024-09-02 NOTE — Telephone Encounter (Signed)
 Reported x-ray results to patient via telephone, 2 patient identifiers used, no change in treatment plan

## 2024-09-03 ENCOUNTER — Encounter

## 2024-09-05 ENCOUNTER — Ambulatory Visit
Admission: RE | Admit: 2024-09-05 | Discharge: 2024-09-05 | Disposition: A | Discharge status: Home / Self Care | Source: Ambulatory Visit | Attending: Family Medicine | Admitting: Family Medicine

## 2024-09-05 DIAGNOSIS — Z1231 Encounter for screening mammogram for malignant neoplasm of breast: Secondary | ICD-10-CM | POA: Diagnosis present

## 2024-10-20 ENCOUNTER — Telehealth: Admitting: Psychiatry

## 2024-10-24 ENCOUNTER — Ambulatory Visit: Admit: 2024-10-24 | Payer: Self-pay | Admitting: Gastroenterology

## 2024-10-24 SURGERY — COLONOSCOPY
Anesthesia: General

## 2024-11-04 ENCOUNTER — Ambulatory Visit: Admit: 2024-11-04 | Admitting: Gastroenterology

## 2024-11-04 SURGERY — COLONOSCOPY
Anesthesia: General
